# Patient Record
Sex: Male | Born: 1935 | Race: White | Hispanic: No | Marital: Single | Smoking: Never smoker
Health system: Southern US, Community
[De-identification: ages and names within clinical notes are randomized; demographics above are authoritative.]

## PROBLEM LIST (undated history)

## (undated) DIAGNOSIS — J189 Pneumonia, unspecified organism: Secondary | ICD-10-CM

## (undated) DIAGNOSIS — Y92009 Unspecified place in unspecified non-institutional (private) residence as the place of occurrence of the external cause: Secondary | ICD-10-CM

## (undated) DIAGNOSIS — H9190 Unspecified hearing loss, unspecified ear: Secondary | ICD-10-CM

## (undated) DIAGNOSIS — Z515 Encounter for palliative care: Secondary | ICD-10-CM

## (undated) DIAGNOSIS — R972 Elevated prostate specific antigen [PSA]: Secondary | ICD-10-CM

## (undated) DIAGNOSIS — N189 Chronic kidney disease, unspecified: Secondary | ICD-10-CM

## (undated) DIAGNOSIS — I10 Essential (primary) hypertension: Secondary | ICD-10-CM

## (undated) DIAGNOSIS — K449 Diaphragmatic hernia without obstruction or gangrene: Secondary | ICD-10-CM

## (undated) DIAGNOSIS — K5790 Diverticulosis of intestine, part unspecified, without perforation or abscess without bleeding: Secondary | ICD-10-CM

## (undated) DIAGNOSIS — I441 Atrioventricular block, second degree: Secondary | ICD-10-CM

## (undated) DIAGNOSIS — C4491 Basal cell carcinoma of skin, unspecified: Secondary | ICD-10-CM

## (undated) DIAGNOSIS — K219 Gastro-esophageal reflux disease without esophagitis: Secondary | ICD-10-CM

## (undated) DIAGNOSIS — W19XXXA Unspecified fall, initial encounter: Secondary | ICD-10-CM

## (undated) DIAGNOSIS — Z9289 Personal history of other medical treatment: Secondary | ICD-10-CM

## (undated) DIAGNOSIS — IMO0002 Reserved for concepts with insufficient information to code with codable children: Secondary | ICD-10-CM

## (undated) DIAGNOSIS — F329 Major depressive disorder, single episode, unspecified: Secondary | ICD-10-CM

## (undated) DIAGNOSIS — E785 Hyperlipidemia, unspecified: Secondary | ICD-10-CM

## (undated) DIAGNOSIS — H903 Sensorineural hearing loss, bilateral: Secondary | ICD-10-CM

## (undated) DIAGNOSIS — F32A Depression, unspecified: Secondary | ICD-10-CM

## (undated) DIAGNOSIS — I639 Cerebral infarction, unspecified: Secondary | ICD-10-CM

## (undated) HISTORY — DX: Essential (primary) hypertension: I10

## (undated) HISTORY — DX: Elevated prostate specific antigen (PSA): R97.20

## (undated) HISTORY — DX: Hyperlipidemia, unspecified: E78.5

## (undated) HISTORY — DX: Atrioventricular block, second degree: I44.1

## (undated) HISTORY — DX: Diverticulosis of intestine, part unspecified, without perforation or abscess without bleeding: K57.90

## (undated) HISTORY — DX: Encounter for palliative care: Z51.5

## (undated) HISTORY — DX: Reserved for concepts with insufficient information to code with codable children: IMO0002

## (undated) HISTORY — DX: Diaphragmatic hernia without obstruction or gangrene: K44.9

## (undated) HISTORY — DX: Cerebral infarction, unspecified: I63.9

## (undated) HISTORY — DX: Basal cell carcinoma of skin, unspecified: C44.91

## (undated) HISTORY — DX: Pneumonia, unspecified organism: J18.9

## (undated) HISTORY — DX: Personal history of other medical treatment: Z92.89

## (undated) HISTORY — PX: CATARACT EXTRACTION W/ INTRAOCULAR LENS  IMPLANT, BILATERAL: SHX1307

## (undated) HISTORY — DX: Gastro-esophageal reflux disease without esophagitis: K21.9

## (undated) HISTORY — DX: Sensorineural hearing loss, bilateral: H90.3

---

## 1986-08-09 DIAGNOSIS — E785 Hyperlipidemia, unspecified: Secondary | ICD-10-CM

## 1986-08-09 HISTORY — DX: Hyperlipidemia, unspecified: E78.5

## 1995-05-10 DIAGNOSIS — Z9289 Personal history of other medical treatment: Secondary | ICD-10-CM

## 1995-05-10 HISTORY — DX: Personal history of other medical treatment: Z92.89

## 1995-05-10 HISTORY — PX: CHOLECYSTECTOMY: SHX55

## 1996-08-09 DIAGNOSIS — K219 Gastro-esophageal reflux disease without esophagitis: Secondary | ICD-10-CM

## 1996-08-09 HISTORY — DX: Gastro-esophageal reflux disease without esophagitis: K21.9

## 1997-08-09 ENCOUNTER — Encounter: Payer: Self-pay | Admitting: Family Medicine

## 1997-08-09 LAB — CONVERTED CEMR LAB: PSA: 3.9 ng/mL

## 1999-01-08 ENCOUNTER — Encounter: Payer: Self-pay | Admitting: Family Medicine

## 1999-01-08 LAB — CONVERTED CEMR LAB: PSA: 4.2 ng/mL

## 1999-12-08 ENCOUNTER — Encounter: Payer: Self-pay | Admitting: Family Medicine

## 1999-12-08 LAB — CONVERTED CEMR LAB: PSA: 3.6 ng/mL

## 2000-12-19 ENCOUNTER — Encounter: Payer: Self-pay | Admitting: Family Medicine

## 2000-12-19 LAB — CONVERTED CEMR LAB: PSA: 3.3 ng/mL

## 2002-01-08 ENCOUNTER — Encounter: Payer: Self-pay | Admitting: Family Medicine

## 2002-01-08 LAB — CONVERTED CEMR LAB: PSA: 4 ng/mL

## 2003-01-10 ENCOUNTER — Encounter: Payer: Self-pay | Admitting: Family Medicine

## 2003-01-10 LAB — CONVERTED CEMR LAB: PSA: 3.7 ng/mL

## 2004-01-22 ENCOUNTER — Encounter: Payer: Self-pay | Admitting: Family Medicine

## 2004-01-22 LAB — CONVERTED CEMR LAB: PSA: 3.5 ng/mL

## 2004-07-23 ENCOUNTER — Ambulatory Visit: Payer: Self-pay | Admitting: Family Medicine

## 2004-07-30 ENCOUNTER — Ambulatory Visit: Payer: Self-pay | Admitting: Gastroenterology

## 2004-08-18 ENCOUNTER — Ambulatory Visit: Payer: Self-pay | Admitting: Gastroenterology

## 2004-08-18 HISTORY — PX: ESOPHAGOGASTRODUODENOSCOPY: SHX1529

## 2004-08-21 ENCOUNTER — Ambulatory Visit (HOSPITAL_COMMUNITY): Admission: RE | Admit: 2004-08-21 | Discharge: 2004-08-21 | Payer: Self-pay | Admitting: Gastroenterology

## 2004-09-03 ENCOUNTER — Ambulatory Visit: Payer: Self-pay | Admitting: Gastroenterology

## 2004-10-11 ENCOUNTER — Emergency Department (HOSPITAL_COMMUNITY): Admission: EM | Admit: 2004-10-11 | Discharge: 2004-10-11 | Payer: Self-pay | Admitting: Emergency Medicine

## 2004-10-16 ENCOUNTER — Ambulatory Visit: Payer: Self-pay | Admitting: Family Medicine

## 2004-10-18 ENCOUNTER — Emergency Department (HOSPITAL_COMMUNITY): Admission: EM | Admit: 2004-10-18 | Discharge: 2004-10-18 | Payer: Self-pay | Admitting: Emergency Medicine

## 2004-10-27 ENCOUNTER — Ambulatory Visit: Payer: Self-pay | Admitting: Family Medicine

## 2004-11-10 ENCOUNTER — Ambulatory Visit: Payer: Self-pay | Admitting: Family Medicine

## 2004-12-10 ENCOUNTER — Ambulatory Visit: Payer: Self-pay | Admitting: Family Medicine

## 2004-12-14 ENCOUNTER — Ambulatory Visit: Payer: Self-pay | Admitting: Family Medicine

## 2004-12-23 LAB — HM DEXA SCAN

## 2005-01-19 ENCOUNTER — Ambulatory Visit: Payer: Self-pay | Admitting: Family Medicine

## 2005-01-19 LAB — CONVERTED CEMR LAB: PSA: 4 ng/mL

## 2005-01-21 ENCOUNTER — Ambulatory Visit: Payer: Self-pay | Admitting: Family Medicine

## 2005-01-28 ENCOUNTER — Ambulatory Visit: Payer: Self-pay | Admitting: Family Medicine

## 2005-03-15 ENCOUNTER — Ambulatory Visit: Payer: Self-pay | Admitting: Family Medicine

## 2005-06-15 ENCOUNTER — Ambulatory Visit: Payer: Self-pay | Admitting: Family Medicine

## 2005-10-13 ENCOUNTER — Ambulatory Visit: Payer: Self-pay | Admitting: Family Medicine

## 2005-11-16 ENCOUNTER — Ambulatory Visit: Payer: Self-pay | Admitting: Family Medicine

## 2006-01-25 ENCOUNTER — Ambulatory Visit: Payer: Self-pay | Admitting: Family Medicine

## 2006-01-25 LAB — CONVERTED CEMR LAB: PSA: 4.58 ng/mL

## 2006-01-27 ENCOUNTER — Ambulatory Visit: Payer: Self-pay | Admitting: Family Medicine

## 2006-02-04 ENCOUNTER — Ambulatory Visit: Payer: Self-pay | Admitting: Family Medicine

## 2006-05-17 ENCOUNTER — Ambulatory Visit: Payer: Self-pay | Admitting: Family Medicine

## 2006-07-29 ENCOUNTER — Ambulatory Visit: Payer: Self-pay | Admitting: Family Medicine

## 2006-07-29 LAB — CONVERTED CEMR LAB: PSA: 7.3 ng/mL

## 2006-08-03 ENCOUNTER — Ambulatory Visit: Payer: Self-pay | Admitting: Cardiology

## 2006-09-08 ENCOUNTER — Ambulatory Visit: Payer: Self-pay | Admitting: Family Medicine

## 2006-09-08 LAB — CONVERTED CEMR LAB
PSA, Free Pct: 33 (ref >25)
PSA, Free: 1.9 ng/mL
PSA: 5.71 ng/mL
PSA: 5.71 ng/mL — ABNORMAL HIGH (ref 0.10–4.00)

## 2006-09-12 ENCOUNTER — Ambulatory Visit: Payer: Self-pay

## 2006-09-12 ENCOUNTER — Ambulatory Visit: Payer: Self-pay | Admitting: Cardiology

## 2006-09-12 ENCOUNTER — Ambulatory Visit: Payer: Self-pay | Admitting: Family Medicine

## 2006-09-12 HISTORY — PX: OTHER SURGICAL HISTORY: SHX169

## 2007-01-03 ENCOUNTER — Emergency Department (HOSPITAL_COMMUNITY): Admission: EM | Admit: 2007-01-03 | Discharge: 2007-01-03 | Payer: Self-pay | Admitting: Emergency Medicine

## 2007-01-03 ENCOUNTER — Telehealth (INDEPENDENT_AMBULATORY_CARE_PROVIDER_SITE_OTHER): Payer: Self-pay | Admitting: *Deleted

## 2007-01-05 ENCOUNTER — Encounter: Payer: Self-pay | Admitting: Family Medicine

## 2007-01-05 DIAGNOSIS — R972 Elevated prostate specific antigen [PSA]: Secondary | ICD-10-CM

## 2007-01-05 DIAGNOSIS — L719 Rosacea, unspecified: Secondary | ICD-10-CM

## 2007-01-05 DIAGNOSIS — K869 Disease of pancreas, unspecified: Secondary | ICD-10-CM | POA: Insufficient documentation

## 2007-01-06 ENCOUNTER — Ambulatory Visit: Payer: Self-pay | Admitting: Family Medicine

## 2007-01-06 DIAGNOSIS — K219 Gastro-esophageal reflux disease without esophagitis: Secondary | ICD-10-CM | POA: Insufficient documentation

## 2007-01-06 DIAGNOSIS — E785 Hyperlipidemia, unspecified: Secondary | ICD-10-CM | POA: Insufficient documentation

## 2007-01-06 DIAGNOSIS — M810 Age-related osteoporosis without current pathological fracture: Secondary | ICD-10-CM | POA: Insufficient documentation

## 2007-01-06 DIAGNOSIS — I10 Essential (primary) hypertension: Secondary | ICD-10-CM

## 2007-02-13 ENCOUNTER — Ambulatory Visit: Payer: Self-pay | Admitting: Family Medicine

## 2007-02-13 LAB — CONVERTED CEMR LAB
ALT: 13 units/L (ref 0–53)
AST: 17 units/L (ref 0–37)
Albumin: 3.6 g/dL (ref 3.5–5.2)
Alkaline Phosphatase: 69 units/L (ref 39–117)
BUN: 15 mg/dL (ref 6–23)
Bilirubin, Direct: 0.1 mg/dL (ref 0.0–0.3)
CO2: 31 meq/L (ref 19–32)
Calcium: 9.1 mg/dL (ref 8.4–10.5)
Chloride: 109 meq/L (ref 96–112)
Cholesterol: 177 mg/dL (ref 0–200)
Creatinine, Ser: 1.2 mg/dL (ref 0.4–1.5)
GFR calc Af Amer: 75 mL/min
GFR calc non Af Amer: 62 mL/min
Glucose, Bld: 106 mg/dL — ABNORMAL HIGH (ref 70–99)
HDL: 36.7 mg/dL — ABNORMAL LOW (ref >39.0)
LDL Cholesterol: 113 mg/dL — ABNORMAL HIGH (ref 0–99)
PSA: 5.88 ng/mL — ABNORMAL HIGH (ref 0.10–4.00)
Potassium: 4.4 meq/L (ref 3.5–5.1)
Sodium: 142 meq/L (ref 135–145)
TSH: 0.83 microintl units/mL (ref 0.35–5.50)
Total Bilirubin: 0.8 mg/dL (ref 0.3–1.2)
Total CHOL/HDL Ratio: 4.8
Total Protein: 7.7 g/dL (ref 6.0–8.3)
Triglycerides: 135 mg/dL (ref 0–149)
VLDL: 27 mg/dL (ref 0–40)

## 2007-02-15 ENCOUNTER — Ambulatory Visit: Payer: Self-pay | Admitting: Family Medicine

## 2007-03-03 ENCOUNTER — Ambulatory Visit: Payer: Self-pay | Admitting: Family Medicine

## 2007-03-07 ENCOUNTER — Ambulatory Visit: Payer: Self-pay | Admitting: Family Medicine

## 2007-03-31 ENCOUNTER — Ambulatory Visit: Payer: Self-pay | Admitting: Family Medicine

## 2007-04-18 ENCOUNTER — Ambulatory Visit: Payer: Self-pay | Admitting: Gastroenterology

## 2007-04-18 LAB — CONVERTED CEMR LAB
Basophils Absolute: 0 10*3/uL (ref 0.0–0.1)
Basophils Relative: 0.5 % (ref 0.0–1.0)
Eosinophils Absolute: 0.2 10*3/uL (ref 0.0–0.6)
Eosinophils Relative: 2.6 % (ref 0.0–5.0)
HCT: 37.6 % — ABNORMAL LOW (ref 39.0–52.0)
Hemoglobin: 13.4 g/dL (ref 13.0–17.0)
Lymphocytes Relative: 20.5 % (ref 12.0–46.0)
MCHC: 35.5 g/dL (ref 30.0–36.0)
MCV: 94.5 fL (ref 78.0–100.0)
Monocytes Absolute: 0.5 10*3/uL (ref 0.2–0.7)
Monocytes Relative: 5.8 % (ref 3.0–11.0)
Neutro Abs: 6.8 10*3/uL (ref 1.4–7.7)
Neutrophils Relative %: 70.6 % (ref 43.0–77.0)
Platelets: 205 10*3/uL (ref 150–400)
RBC: 3.98 M/uL — ABNORMAL LOW (ref 4.22–5.81)
RDW: 12.3 % (ref 11.5–14.6)
WBC: 9.4 10*3/uL (ref 4.5–10.5)

## 2007-05-01 ENCOUNTER — Ambulatory Visit: Payer: Self-pay | Admitting: Gastroenterology

## 2007-05-01 ENCOUNTER — Encounter: Payer: Self-pay | Admitting: Family Medicine

## 2007-05-01 DIAGNOSIS — K5730 Diverticulosis of large intestine without perforation or abscess without bleeding: Secondary | ICD-10-CM | POA: Insufficient documentation

## 2007-05-01 DIAGNOSIS — K648 Other hemorrhoids: Secondary | ICD-10-CM | POA: Insufficient documentation

## 2007-05-01 HISTORY — PX: COLONOSCOPY: SHX174

## 2007-05-01 LAB — HM COLONOSCOPY

## 2007-05-19 ENCOUNTER — Ambulatory Visit: Payer: Self-pay | Admitting: Family Medicine

## 2007-08-21 ENCOUNTER — Ambulatory Visit: Payer: Self-pay | Admitting: Family Medicine

## 2007-08-28 ENCOUNTER — Ambulatory Visit: Payer: Self-pay | Admitting: Family Medicine

## 2007-09-04 ENCOUNTER — Ambulatory Visit: Payer: Self-pay | Admitting: Family Medicine

## 2007-09-11 ENCOUNTER — Ambulatory Visit: Payer: Self-pay | Admitting: Family Medicine

## 2007-09-18 ENCOUNTER — Ambulatory Visit: Payer: Self-pay | Admitting: Family Medicine

## 2007-09-25 ENCOUNTER — Ambulatory Visit: Payer: Self-pay | Admitting: Family Medicine

## 2007-10-27 DIAGNOSIS — K449 Diaphragmatic hernia without obstruction or gangrene: Secondary | ICD-10-CM | POA: Insufficient documentation

## 2007-11-01 ENCOUNTER — Ambulatory Visit: Payer: Self-pay | Admitting: Internal Medicine

## 2007-11-01 LAB — CONVERTED CEMR LAB: Rapid Strep: NEGATIVE

## 2008-02-12 ENCOUNTER — Ambulatory Visit: Payer: Self-pay | Admitting: Family Medicine

## 2008-02-12 LAB — CONVERTED CEMR LAB
ALT: 11 units/L (ref 0–53)
AST: 13 units/L (ref 0–37)
Albumin: 3.4 g/dL — ABNORMAL LOW (ref 3.5–5.2)
Alkaline Phosphatase: 48 units/L (ref 39–117)
BUN: 13 mg/dL (ref 6–23)
Basophils Absolute: 0 10*3/uL (ref 0.0–0.1)
Basophils Relative: 0.6 % (ref 0.0–1.0)
Bilirubin, Direct: 0.1 mg/dL (ref 0.0–0.3)
CO2: 33 meq/L — ABNORMAL HIGH (ref 19–32)
Calcium: 9.4 mg/dL (ref 8.4–10.5)
Chloride: 103 meq/L (ref 96–112)
Cholesterol: 175 mg/dL (ref 0–200)
Creatinine, Ser: 1.2 mg/dL (ref 0.4–1.5)
Eosinophils Absolute: 0.2 10*3/uL (ref 0.0–0.7)
Eosinophils Relative: 3.6 % (ref 0.0–5.0)
GFR calc Af Amer: 75 mL/min
GFR calc non Af Amer: 62 mL/min
Glucose, Bld: 91 mg/dL (ref 70–99)
HCT: 41.3 % (ref 39.0–52.0)
HDL: 31.7 mg/dL — ABNORMAL LOW (ref >39.0)
Hemoglobin: 14.8 g/dL (ref 13.0–17.0)
LDL Cholesterol: 122 mg/dL — ABNORMAL HIGH (ref 0–99)
Lymphocytes Relative: 29.5 % (ref 12.0–46.0)
MCHC: 35.8 g/dL (ref 30.0–36.0)
MCV: 94.4 fL (ref 78.0–100.0)
Monocytes Absolute: 0.6 10*3/uL (ref 0.1–1.0)
Monocytes Relative: 9.5 % (ref 3.0–12.0)
Neutro Abs: 3.8 10*3/uL (ref 1.4–7.7)
Neutrophils Relative %: 56.8 % (ref 43.0–77.0)
PSA: 6.65 ng/mL — ABNORMAL HIGH (ref 0.10–4.00)
Platelets: 168 10*3/uL (ref 150–400)
Potassium: 3.8 meq/L (ref 3.5–5.1)
RBC: 4.38 M/uL (ref 4.22–5.81)
RDW: 13 % (ref 11.5–14.6)
Sodium: 143 meq/L (ref 135–145)
TSH: 1.15 microintl units/mL (ref 0.35–5.50)
Total Bilirubin: 0.9 mg/dL (ref 0.3–1.2)
Total CHOL/HDL Ratio: 5.5
Total Protein: 6.9 g/dL (ref 6.0–8.3)
Triglycerides: 105 mg/dL (ref 0–149)
VLDL: 21 mg/dL (ref 0–40)
WBC: 6.5 10*3/uL (ref 4.5–10.5)

## 2008-02-15 LAB — CONVERTED CEMR LAB: Vit D, 1,25-Dihydroxy: 26 — ABNORMAL LOW (ref 30–89)

## 2008-02-16 ENCOUNTER — Ambulatory Visit: Payer: Self-pay | Admitting: Family Medicine

## 2008-05-13 ENCOUNTER — Ambulatory Visit: Payer: Self-pay | Admitting: Family Medicine

## 2008-08-12 ENCOUNTER — Ambulatory Visit: Payer: Self-pay | Admitting: Family Medicine

## 2008-08-13 LAB — CONVERTED CEMR LAB
ALT: 12 units/L (ref 0–53)
AST: 17 units/L (ref 0–37)
Albumin: 3.7 g/dL (ref 3.5–5.2)
Alkaline Phosphatase: 35 units/L — ABNORMAL LOW (ref 39–117)
BUN: 22 mg/dL (ref 6–23)
Basophils Absolute: 0.1 10*3/uL (ref 0.0–0.1)
Basophils Relative: 0.8 % (ref 0.0–3.0)
Bilirubin, Direct: 0.1 mg/dL (ref 0.0–0.3)
CO2: 29 meq/L (ref 19–32)
Calcium: 9.6 mg/dL (ref 8.4–10.5)
Chloride: 103 meq/L (ref 96–112)
Creatinine, Ser: 1.2 mg/dL (ref 0.4–1.5)
Eosinophils Absolute: 0.4 10*3/uL (ref 0.0–0.7)
Eosinophils Relative: 3.9 % (ref 0.0–5.0)
GFR calc Af Amer: 75 mL/min
GFR calc non Af Amer: 62 mL/min
Glucose, Bld: 112 mg/dL — ABNORMAL HIGH (ref 70–99)
HCT: 39.8 % (ref 39.0–52.0)
Hemoglobin: 14.2 g/dL (ref 13.0–17.0)
Lymphocytes Relative: 28.1 % (ref 12.0–46.0)
MCHC: 35.6 g/dL (ref 30.0–36.0)
MCV: 97.1 fL (ref 78.0–100.0)
Monocytes Absolute: 0.5 10*3/uL (ref 0.1–1.0)
Monocytes Relative: 5.5 % (ref 3.0–12.0)
Neutro Abs: 5.8 10*3/uL (ref 1.4–7.7)
Neutrophils Relative %: 61.7 % (ref 43.0–77.0)
Platelets: 190 10*3/uL (ref 150–400)
Potassium: 4.2 meq/L (ref 3.5–5.1)
RBC: 4.09 M/uL — ABNORMAL LOW (ref 4.22–5.81)
RDW: 12.4 % (ref 11.5–14.6)
Sodium: 141 meq/L (ref 135–145)
Total Bilirubin: 0.6 mg/dL (ref 0.3–1.2)
Total Protein: 7.3 g/dL (ref 6.0–8.3)
WBC: 9.4 10*3/uL (ref 4.5–10.5)

## 2008-08-20 ENCOUNTER — Ambulatory Visit: Payer: Self-pay | Admitting: Family Medicine

## 2008-12-04 ENCOUNTER — Encounter: Payer: Self-pay | Admitting: Family Medicine

## 2009-02-13 ENCOUNTER — Ambulatory Visit: Payer: Self-pay | Admitting: Family Medicine

## 2009-02-13 LAB — CONVERTED CEMR LAB
ALT: 11 units/L (ref 0–53)
AST: 16 units/L (ref 0–37)
Albumin: 3.4 g/dL — ABNORMAL LOW (ref 3.5–5.2)
Alkaline Phosphatase: 41 units/L (ref 39–117)
BUN: 12 mg/dL (ref 6–23)
Basophils Absolute: 0 10*3/uL (ref 0.0–0.1)
Basophils Relative: 0.6 % (ref 0.0–3.0)
Bilirubin, Direct: 0.1 mg/dL (ref 0.0–0.3)
CO2: 32 meq/L (ref 19–32)
Calcium: 9.1 mg/dL (ref 8.4–10.5)
Chloride: 106 meq/L (ref 96–112)
Cholesterol: 174 mg/dL (ref 0–200)
Creatinine, Ser: 1.3 mg/dL (ref 0.4–1.5)
Eosinophils Absolute: 0.3 10*3/uL (ref 0.0–0.7)
Eosinophils Relative: 5.3 % — ABNORMAL HIGH (ref 0.0–5.0)
GFR calc non Af Amer: 56.06 mL/min (ref >60)
Glucose, Bld: 86 mg/dL (ref 70–99)
HCT: 42.4 % (ref 39.0–52.0)
HDL: 36.9 mg/dL — ABNORMAL LOW (ref >39.00)
Hemoglobin: 14.6 g/dL (ref 13.0–17.0)
LDL Cholesterol: 117 mg/dL — ABNORMAL HIGH (ref 0–99)
Lymphocytes Relative: 32.7 % (ref 12.0–46.0)
Lymphs Abs: 1.9 10*3/uL (ref 0.7–4.0)
MCHC: 34.4 g/dL (ref 30.0–36.0)
MCV: 95.4 fL (ref 78.0–100.0)
Monocytes Absolute: 0.6 10*3/uL (ref 0.1–1.0)
Monocytes Relative: 9.8 % (ref 3.0–12.0)
Neutro Abs: 3.1 10*3/uL (ref 1.4–7.7)
Neutrophils Relative %: 51.6 % (ref 43.0–77.0)
PSA, Free Pct: 26 (ref >25)
PSA, Free: 1.3 ng/mL
PSA: 4.99 ng/mL — ABNORMAL HIGH (ref 0.10–4.00)
Platelets: 162 10*3/uL (ref 150.0–400.0)
Potassium: 4.4 meq/L (ref 3.5–5.1)
RBC: 4.45 M/uL (ref 4.22–5.81)
RDW: 13.4 % (ref 11.5–14.6)
Sodium: 143 meq/L (ref 135–145)
TSH: 1.13 microintl units/mL (ref 0.35–5.50)
Total Bilirubin: 1 mg/dL (ref 0.3–1.2)
Total CHOL/HDL Ratio: 5
Total Protein: 7.4 g/dL (ref 6.0–8.3)
Triglycerides: 101 mg/dL (ref 0.0–149.0)
VLDL: 20.2 mg/dL (ref 0.0–40.0)
Vit D, 25-Hydroxy: 45 ng/mL (ref 30–89)
WBC: 5.9 10*3/uL (ref 4.5–10.5)

## 2009-02-17 ENCOUNTER — Ambulatory Visit: Payer: Self-pay | Admitting: Family Medicine

## 2009-02-17 DIAGNOSIS — K4090 Unilateral inguinal hernia, without obstruction or gangrene, not specified as recurrent: Secondary | ICD-10-CM | POA: Insufficient documentation

## 2009-03-14 ENCOUNTER — Encounter: Payer: Self-pay | Admitting: Family Medicine

## 2009-05-08 ENCOUNTER — Ambulatory Visit: Payer: Self-pay | Admitting: Family Medicine

## 2009-09-04 ENCOUNTER — Ambulatory Visit: Payer: Self-pay | Admitting: Family Medicine

## 2010-04-14 DIAGNOSIS — E876 Hypokalemia: Secondary | ICD-10-CM

## 2010-04-16 ENCOUNTER — Ambulatory Visit: Payer: Self-pay | Admitting: Cardiology

## 2010-04-16 ENCOUNTER — Inpatient Hospital Stay (HOSPITAL_COMMUNITY)
Admission: EM | Admit: 2010-04-16 | Discharge: 2010-04-18 | Payer: Self-pay | Source: Home / Self Care | Admitting: Emergency Medicine

## 2010-04-17 ENCOUNTER — Ambulatory Visit: Payer: Self-pay | Admitting: Vascular Surgery

## 2010-05-14 ENCOUNTER — Ambulatory Visit: Payer: Self-pay | Admitting: Family Medicine

## 2010-05-14 LAB — CONVERTED CEMR LAB
BUN: 19 mg/dL (ref 6–23)
Basophils Absolute: 0 10*3/uL (ref 0.0–0.1)
Basophils Relative: 0.3 % (ref 0.0–3.0)
CO2: 32 meq/L (ref 19–32)
Calcium: 9.6 mg/dL (ref 8.4–10.5)
Chloride: 99 meq/L (ref 96–112)
Creatinine, Ser: 1.3 mg/dL (ref 0.4–1.5)
Eosinophils Absolute: 0.2 10*3/uL (ref 0.0–0.7)
Eosinophils Relative: 1.8 % (ref 0.0–5.0)
GFR calc non Af Amer: 55.4 mL/min (ref >60)
Glucose, Bld: 94 mg/dL (ref 70–99)
HCT: 45 % (ref 39.0–52.0)
Hemoglobin: 15.6 g/dL (ref 13.0–17.0)
Lymphocytes Relative: 27.6 % (ref 12.0–46.0)
Lymphs Abs: 2.6 10*3/uL (ref 0.7–4.0)
MCHC: 34.6 g/dL (ref 30.0–36.0)
MCV: 97 fL (ref 78.0–100.0)
Monocytes Absolute: 0.8 10*3/uL (ref 0.1–1.0)
Monocytes Relative: 8.8 % (ref 3.0–12.0)
Neutro Abs: 5.7 10*3/uL (ref 1.4–7.7)
Neutrophils Relative %: 61.5 % (ref 43.0–77.0)
Platelets: 187 10*3/uL (ref 150.0–400.0)
Potassium: 4.1 meq/L (ref 3.5–5.1)
RBC: 4.64 M/uL (ref 4.22–5.81)
RDW: 14.2 % (ref 11.5–14.6)
Sodium: 138 meq/L (ref 135–145)
WBC: 9.3 10*3/uL (ref 4.5–10.5)

## 2010-06-25 ENCOUNTER — Ambulatory Visit: Payer: Self-pay | Admitting: Family Medicine

## 2010-06-25 LAB — CONVERTED CEMR LAB
ALT: 10 units/L (ref 0–53)
AST: 14 units/L (ref 0–37)
Albumin: 3.8 g/dL (ref 3.5–5.2)
Alkaline Phosphatase: 46 units/L (ref 39–117)
BUN: 19 mg/dL (ref 6–23)
Basophils Absolute: 0 10*3/uL (ref 0.0–0.1)
Basophils Relative: 0.5 % (ref 0.0–3.0)
Bilirubin, Direct: 0.1 mg/dL (ref 0.0–0.3)
CO2: 30 meq/L (ref 19–32)
Calcium: 9.5 mg/dL (ref 8.4–10.5)
Chloride: 99 meq/L (ref 96–112)
Cholesterol: 181 mg/dL (ref 0–200)
Creatinine, Ser: 1.3 mg/dL (ref 0.4–1.5)
Eosinophils Absolute: 0.2 10*3/uL (ref 0.0–0.7)
Eosinophils Relative: 3.4 % (ref 0.0–5.0)
GFR calc non Af Amer: 57.4 mL/min (ref >60)
Glucose, Bld: 99 mg/dL (ref 70–99)
HCT: 42.6 % (ref 39.0–52.0)
HDL: 37.2 mg/dL — ABNORMAL LOW (ref >39.00)
Hemoglobin: 14.4 g/dL (ref 13.0–17.0)
LDL Cholesterol: 117 mg/dL — ABNORMAL HIGH (ref 0–99)
Lymphocytes Relative: 29.2 % (ref 12.0–46.0)
Lymphs Abs: 2 10*3/uL (ref 0.7–4.0)
MCHC: 33.9 g/dL (ref 30.0–36.0)
MCV: 97.7 fL (ref 78.0–100.0)
Monocytes Absolute: 0.6 10*3/uL (ref 0.1–1.0)
Monocytes Relative: 9.7 % (ref 3.0–12.0)
Neutro Abs: 3.8 10*3/uL (ref 1.4–7.7)
Neutrophils Relative %: 57.2 % (ref 43.0–77.0)
Platelets: 208 10*3/uL (ref 150.0–400.0)
Potassium: 4 meq/L (ref 3.5–5.1)
RBC: 4.36 M/uL (ref 4.22–5.81)
RDW: 13.2 % (ref 11.5–14.6)
Sodium: 138 meq/L (ref 135–145)
TSH: 0.63 microintl units/mL (ref 0.35–5.50)
Total Bilirubin: 0.6 mg/dL (ref 0.3–1.2)
Total CHOL/HDL Ratio: 5
Total Protein: 7.1 g/dL (ref 6.0–8.3)
Triglycerides: 135 mg/dL (ref 0.0–149.0)
VLDL: 27 mg/dL (ref 0.0–40.0)
WBC: 6.7 10*3/uL (ref 4.5–10.5)

## 2010-06-29 LAB — CONVERTED CEMR LAB: Vit D, 25-Hydroxy: 44 ng/mL (ref 30–89)

## 2010-07-08 ENCOUNTER — Ambulatory Visit: Payer: Self-pay | Admitting: Family Medicine

## 2010-07-16 ENCOUNTER — Ambulatory Visit: Payer: Self-pay | Admitting: Family Medicine

## 2010-07-29 ENCOUNTER — Encounter: Payer: Self-pay | Admitting: Family Medicine

## 2010-08-12 LAB — BASIC METABOLIC PANEL
BUN: 18 mg/dL (ref 6–23)
CO2: 26 mEq/L (ref 19–32)
Calcium: 9.1 mg/dL (ref 8.4–10.5)
Chloride: 104 mEq/L (ref 96–112)
Creatinine, Ser: 1.38 mg/dL (ref 0.4–1.5)
GFR calc Af Amer: 59 mL/min — ABNORMAL LOW (ref >60)
GFR calc non Af Amer: 49 mL/min — ABNORMAL LOW (ref >60)
Glucose, Bld: 90 mg/dL (ref 70–99)
Potassium: 3.2 mEq/L — ABNORMAL LOW (ref 3.5–5.1)
Sodium: 140 mEq/L (ref 135–145)

## 2010-08-14 ENCOUNTER — Ambulatory Visit (HOSPITAL_COMMUNITY)
Admission: RE | Admit: 2010-08-14 | Discharge: 2010-08-15 | Payer: Self-pay | Source: Home / Self Care | Attending: Surgery | Admitting: Surgery

## 2010-08-14 HISTORY — PX: INGUINAL HERNIA REPAIR: SUR1180

## 2010-08-17 ENCOUNTER — Encounter: Payer: Self-pay | Admitting: Family Medicine

## 2010-08-17 ENCOUNTER — Telehealth: Payer: Self-pay | Admitting: Family Medicine

## 2010-08-24 LAB — CBC
HCT: 43.4 % (ref 39.0–52.0)
Hemoglobin: 14.6 g/dL (ref 13.0–17.0)
MCH: 32.4 pg (ref 26.0–34.0)
MCHC: 33.6 g/dL (ref 30.0–36.0)
MCV: 96.4 fL (ref 78.0–100.0)
Platelets: 157 10*3/uL (ref 150–400)
RBC: 4.5 MIL/uL (ref 4.22–5.81)
RDW: 12.8 % (ref 11.5–15.5)
WBC: 6.4 10*3/uL (ref 4.0–10.5)

## 2010-08-25 ENCOUNTER — Encounter: Payer: Self-pay | Admitting: Family Medicine

## 2010-08-27 ENCOUNTER — Encounter: Payer: Self-pay | Admitting: Family Medicine

## 2010-09-01 ENCOUNTER — Telehealth: Payer: Self-pay | Admitting: Family Medicine

## 2010-09-03 ENCOUNTER — Encounter: Payer: Self-pay | Admitting: Family Medicine

## 2010-09-03 LAB — SURGICAL PCR SCREEN
MRSA, PCR: NEGATIVE
Staphylococcus aureus: NEGATIVE

## 2010-09-05 ENCOUNTER — Encounter: Payer: Self-pay | Admitting: Family Medicine

## 2010-09-08 NOTE — Assessment & Plan Note (Signed)
Summary: 1 WK F/U DLO   Vital Signs:  Patient profile:   77 year old male Weight:      170.50 pounds Temp:     98.0 degrees F oral Pulse rate:   60 / minute Pulse rhythm:   regular BP sitting:   126 / 70  (left arm) Cuff size:   regular  Vitals Entered By: Sydell Axon LPN (July 16, 2010 8:54 AM) CC: One week follow-up on hands and feet, has been using the cream and it has helped   History of Present Illness: Pt here for one week followup of dermatitis of the hands and feet, felt to be dry skin. He was told to use Eucerin routinely and has seen significant improvement. He has had sloughing of the skin on the hands>>feet and both have intact skin otherwise. He has no other complaints.  Problems Prior to Update: 1)  Dyshidrosis  (ICD-705.81) 2)  Hypokalemia  (ICD-276.8) 3)  Inguinal Hernia, Left  (ICD-550.90) 4)  Hx of Hiatal Hernia  (ICD-553.3) 5)  Hemorrhoids, Internal  (ICD-455.0) 6)  Diverticulosis, Colon  (ICD-562.10) 7)  Screening For Malignannt Neoplasm, Site Nec  (ICD-V76.49) 8)  Compression Fracture, L 3 Vertebrae  (ICD-805.4) 9)  Gallstone Pancreatitis  (ICD-577.9) 10)  Prostate Specific Antigen, Elevated  (ICD-790.93) 11)  Rosacea  (ICD-695.3) 12)  Basal Cell Carcinoma, Face L Ear  (ICD-173.3) 13)  Osteoporosis  (ICD-733.00) 14)  Hypertension  (ICD-401.9) 15)  Hyperlipidemia  (ICD-272.4) 16)  Gerd  (ICD-530.81)  Medications Prior to Update: 1)  Nexium 40 Mg Cpdr (Esomeprazole Magnesium) .Marland Kitchen.. 1 Tablet Daily 2)  Lisinopril-Hydrochlorothiazide 20-25 Mg Tabs (Lisinopril-Hydrochlorothiazide) .Marland Kitchen.. 1 Tablet Daily 3)  Flaxseed Oil 1000 Mg  Caps (Flaxseed (Linseed)) .Marland Kitchen.. 1 Daily 4)  Bayer Low Strength 81 Mg Tbec (Aspirin) .Marland Kitchen.. 1 Daily By Mouth 5)  Vitamin D 1000 Unit Caps (Cholecalciferol) .Marland Kitchen.. 1 Daily By Mouth 6)  Potassium Chloride 20 Meq Pack (Potassium Chloride) .... Take One By Mouth Daily 7)  Loratadine 10 Mg Tabs (Loratadine) .... Take One By Mouth  Daily  Allergies: No Known Drug Allergies  Physical Exam  General:  Well-developed,well-nourished,in no acute distress; alert,appropriate and cooperative throughout examination Skin:  Hands with significant sloughing of skin with intact skin below. Feet minimally sloughing. Skin of both smooth and intact otherwise, slightly erythematous.   Impression & Recommendations:  Problem # 1:  DYSHIDROSIS (ICD-705.81) Assessment Improved Cont Eucerin cream as discussed. May decrease frequency as skin normalizes.  Use rubber gloves for housework and fluid exposure. Avoid exposure to Chlorox and frequent fluid exposure.  Problem # 2:  HYPERTENSION (ICD-401.9) Assessment: Improved Stable. His updated medication list for this problem includes:    Lisinopril-hydrochlorothiazide 20-25 Mg Tabs (Lisinopril-hydrochlorothiazide) .Marland Kitchen... 1 tablet daily  BP today: 126/70 Prior BP: 134/88 (07/08/2010)  Labs Reviewed: K+: 4.0 (06/25/2010) Creat: : 1.3 (06/25/2010)   Chol: 181 (06/25/2010)   HDL: 37.20 (06/25/2010)   LDL: 117 (06/25/2010)   TG: 135.0 (06/25/2010)  Complete Medication List: 1)  Nexium 40 Mg Cpdr (Esomeprazole magnesium) .Marland Kitchen.. 1 tablet daily 2)  Lisinopril-hydrochlorothiazide 20-25 Mg Tabs (Lisinopril-hydrochlorothiazide) .Marland Kitchen.. 1 tablet daily 3)  Flaxseed Oil 1000 Mg Caps (Flaxseed (linseed)) .Marland Kitchen.. 1 daily 4)  Bayer Low Strength 81 Mg Tbec (Aspirin) .Marland Kitchen.. 1 daily by mouth 5)  Vitamin D 1000 Unit Caps (Cholecalciferol) .Marland Kitchen.. 1 daily by mouth 6)  Potassium Chloride 20 Meq Pack (Potassium chloride) .... Take one by mouth daily 7)  Loratadine 10 Mg Tabs (Loratadine) .Marland KitchenMarland KitchenMarland Kitchen  Take one by mouth daily 8)  Eucerin Calming Daily Moist Crea (Emollient) .... Apply four times a day to affected area   Orders Added: 1)  Est. Patient Level III [59563]    Current Allergies (reviewed today): No known allergies

## 2010-09-08 NOTE — Assessment & Plan Note (Signed)
Summary: HANDS AND FEET/CLE   Vital Signs:  Patient profile:   77 year old male Height:      68 inches Weight:      169.25 pounds BMI:     25.83 Temp:     96.9 degrees F oral Pulse rate:   68 / minute Pulse rhythm:   regular BP sitting:   134 / 88  (left arm) Cuff size:   regular  Vitals Entered By: Linde Gillis CMA Duncan Dull) (July 08, 2010 10:22 AM) CC: hands and feet swollen and red   History of Present Illness: Pt here with his son for erythema , itching and mild swelling of his hands and feet which started about a week ago. He denies new powders or soaps but strted using blkeach about 6 mos ago. He has been raking leaves but no other unusual exposure. He feels well otherwise. Burgess Estelle was the three year anniversary of his wife's death. 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  Problems Prior to Update: 1)  Hypokalemia  (ICD-276.8) 2)  Inguinal Hernia, Left  (ICD-550.90) 3)  Hx of Hiatal Hernia  (ICD-553.3) 4)  Hemorrhoids, Internal  (ICD-455.0) 5)  Diverticulosis, Colon  (ICD-562.10) 6)  Screening For Malignannt Neoplasm, Site Nec  (ICD-V76.49) 7)  Compression Fracture, L 3 Vertebrae  (ICD-805.4) 8)  Gallstone Pancreatitis  (ICD-577.9) 9)  Prostate Specific Antigen, Elevated  (ICD-790.93) 10)  Rosacea  (ICD-695.3) 11)  Basal Cell Carcinoma, Face L Ear  (ICD-173.3) 12)  Osteoporosis  (ICD-733.00) 13)  Hypertension  (ICD-401.9) 14)  Hyperlipidemia  (ICD-272.4) 15)  Gerd  (ICD-530.81)  Medications Prior to Update: 1)  Nexium 40 Mg Cpdr (Esomeprazole Magnesium) .Marland Kitchen.. 1 Tablet Daily 2)  Lisinopril-Hydrochlorothiazide 20-25 Mg Tabs (Lisinopril-Hydrochlorothiazide) .Marland Kitchen.. 1 Tablet Daily 3)  Flaxseed Oil 1000 Mg  Caps (Flaxseed (Linseed)) .Marland Kitchen.. 1 Daily 4)  Bayer Low Strength 81 Mg Tbec (Aspirin) .Marland Kitchen.. 1 Daily By Mouth 5)  Vitamin D 1000 Unit Caps (Cholecalciferol) .Marland Kitchen.. 1 Daily By Mouth 6)  Potassium Chloride 20 Meq Pack (Potassium Chloride) .... Take One By Mouth Daily 7)  Loratadine  10 Mg Tabs (Loratadine) .... Take One By Mouth Daily  Allergies (verified): No Known Drug Allergies  Physical Exam  General:  Well-developed,well-nourished,in no acute distress; alert,appropriate and cooperative throughout examination Head:  Normocephalic and atraumatic without obvious abnormalities. No apparent alopecia. Typical male pattern  balding. Eyes:  Conjunctiva clear bilaterally.  Ears:  External ear exam shows no significant lesions or deformities.  Otoscopic examination reveals clear canals, tympanic membranes are intact bilaterally without bulging, retraction, inflammation or discharge. Hearing is grossly normal bilaterally. Nose:  External nasal examination shows no deformity or inflammation. Nasal mucosa are pink and moist without lesions or exudates. Mouth:  Oral mucosa and oropharynx without lesions or exudates.  Teeth in good repair. Skin:  Hands erythem on the palmar surface bilat, dorsal surface nml. Feet red around and between the toes of bothe feet  going poteriorally laterally bilat, minimal involvement of the frank plantar or dorsal surfaces. Minimal swelling if any globally.   Impression & Recommendations:  Problem # 1:  DYSHIDROSIS (ICD-705.81) Assessment New See instructions. Call or RTC sooner as needed.  Complete Medication List: 1)  Nexium 40 Mg Cpdr (Esomeprazole magnesium) .Marland Kitchen.. 1 tablet daily 2)  Lisinopril-hydrochlorothiazide 20-25 Mg Tabs (Lisinopril-hydrochlorothiazide) .Marland Kitchen.. 1 tablet daily 3)  Flaxseed Oil 1000 Mg Caps (Flaxseed (linseed)) .Marland Kitchen.. 1 daily 4)  Bayer Low Strength 81 Mg Tbec (Aspirin) .Marland Kitchen.. 1 daily by mouth 5)  Vitamin D 1000 Unit Caps (Cholecalciferol) .Marland Kitchen.. 1 daily by mouth 6)  Potassium Chloride 20 Meq Pack (Potassium chloride) .... Take one by mouth daily 7)  Loratadine 10 Mg Tabs (Loratadine) .... Take one by mouth daily  Patient Instructions: 1)  RTC one week.  2)  Try using Eucerin Cream, apply 4 times a day to the hands and  feet. 3)  Do not soak hands or feet, no more frequent bathing than once a day.  4)  Discussed 3 year anniv of his wife's death.   Orders Added: 1)  Est. Patient Level III [04540]    Current Allergies (reviewed today): No known allergies

## 2010-09-08 NOTE — Assessment & Plan Note (Signed)
Summary: follow up/alc   Vital Signs:  Patient profile:   77 year old male Weight:      176 pounds BMI:     26.86 Temp:     99.0 degrees F oral Pulse rate:   76 / minute Pulse rhythm:   regular BP sitting:   122 / 70  (left arm) Cuff size:   regular  Vitals Entered By: Sydell Axon LPN (May 14, 2010 11:23 AM) CC: follow-up visit   History of Present Illness: Pt here with daughter in followup of hospitalization a few weeks ago for dehydration and weakness. He had hypokalemia to 2.9 and responded well to hydration and repletion. He now feels well and his daughter has a hard time staying up with him. She actually wonders if he should slow down!! He was put on oral potassium from the hospital. He also would like his hernia repaired.  Problems Prior to Update: 1)  Hypokalemia  (ICD-276.8) 2)  Inguinal Hernia, Left  (ICD-550.90) 3)  Hx of Hiatal Hernia  (ICD-553.3) 4)  Hemorrhoids, Internal  (ICD-455.0) 5)  Diverticulosis, Colon  (ICD-562.10) 6)  Screening For Malignannt Neoplasm, Site Nec  (ICD-V76.49) 7)  Compression Fracture, L 3 Vertebrae  (ICD-805.4) 8)  Gallstone Pancreatitis  (ICD-577.9) 9)  Prostate Specific Antigen, Elevated  (ICD-790.93) 10)  Rosacea  (ICD-695.3) 11)  Basal Cell Carcinoma, Face L Ear  (ICD-173.3) 12)  Osteoporosis  (ICD-733.00) 13)  Hypertension  (ICD-401.9) 14)  Hyperlipidemia  (ICD-272.4) 15)  Gerd  (ICD-530.81)  Medications Prior to Update: 1)  Boniva 150 Mg Tabs (Ibandronate Sodium) .... Take One By Mouth Monthly 2)  Nexium 40 Mg Cpdr (Esomeprazole Magnesium) .Marland Kitchen.. 1 Tablet Daily 3)  Klor-Con M10 10 Meq Tbcr (Potassium Chloride Crys Cr) .Marland Kitchen.. 1 Tablet Daily 4)  Lisinopril-Hydrochlorothiazide 20-25 Mg Tabs (Lisinopril-Hydrochlorothiazide) .Marland Kitchen.. 1 Tablet Daily 5)  Flaxseed Oil 1000 Mg  Caps (Flaxseed (Linseed)) .Marland Kitchen.. 1 Daily 6)  Bayer Low Strength 81 Mg Tbec (Aspirin) .Marland Kitchen.. 1 Daily By Mouth 7)  Vitamin D 1000 Unit Caps (Cholecalciferol) .Marland Kitchen.. 1 Daily  By Mouth  Allergies: No Known Drug Allergies  Physical Exam  General:  Well-developed,well-nourished,in no acute distress; alert,appropriate and cooperative throughout examination Head:  Normocephalic and atraumatic without obvious abnormalities. No apparent alopecia. Typical male pattern  balding. Eyes:  Conjunctiva clear bilaterally.  Ears:  External ear exam shows no significant lesions or deformities.  Otoscopic examination reveals clear canals, tympanic membranes are intact bilaterally without bulging, retraction, inflammation or discharge. Hearing is grossly normal bilaterally. Nose:  External nasal examination shows no deformity or inflammation. Nasal mucosa are pink and moist without lesions or exudates. Mouth:  Oral mucosa and oropharynx without lesions or exudates.  Teeth in good repair. Neck:  No deformities, masses, or tenderness noted. Chest Wall:  No deformities, masses, tenderness or gynecomastia noted. Barrel chested. Lungs:  Normal respiratory effort, chest expands symmetrically. Lungs are clear to auscultation, no crackles or wheezes. Heart:  Normal rate and regular rhythm. S1 and S2 normal without gallop, murmur, click, rub or other extra sounds. Abdomen:  Bowel sounds positive,abdomen soft and non-tender without masses, organomegaly. Longstanding left inguinal hernia tyhat has enlarged and chronically descended. Neurologic:  No cranial nerve deficits noted. Station and gait are normal. Sensory, motor and coordinative functions appear intact. Skin:  Intact without suspicious lesions or rashes Psych:  Cognition and judgment appear intact. Alert and cooperative with normal attention span and concentration. No apparent delusions, illusions, hallucinations   Impression & Recommendations:  Problem # 1:  HYPOKALEMIA (ICD-276.8) Assessment New Appears resolved. Will recheck electrolytes. Cont curr meds. Will stop K if normalized and recheck 2 weeks later.  Problem # 2:   INGUINAL HERNIA, LEFT (ICD-550.90) Assessment: Unchanged Wants to have repair done. Had told Dr Daphine Deutscher he wanted to wait. Told him to call Dr Daphine Deutscher and let him know.  Problem # 3:  HYPERTENSION (ICD-401.9) Assessment: Improved Better, cont curr meds. His updated medication list for this problem includes:    Lisinopril-hydrochlorothiazide 20-25 Mg Tabs (Lisinopril-hydrochlorothiazide) .Marland Kitchen... 1 tablet daily  Orders: TLB-BMP (Basic Metabolic Panel-BMET) (80048-METABOL)  BP today: 122/70 Prior BP: 140/90 (09/04/2009)  Labs Reviewed: K+: 4.4 (02/13/2009) Creat: : 1.3 (02/13/2009)   Chol: 174 (02/13/2009)   HDL: 36.90 (02/13/2009)   LDL: 117 (02/13/2009)   TG: 101.0 (02/13/2009)  Complete Medication List: 1)  Nexium 40 Mg Cpdr (Esomeprazole magnesium) .Marland Kitchen.. 1 tablet daily 2)  Lisinopril-hydrochlorothiazide 20-25 Mg Tabs (Lisinopril-hydrochlorothiazide) .Marland Kitchen.. 1 tablet daily 3)  Flaxseed Oil 1000 Mg Caps (Flaxseed (linseed)) .Marland Kitchen.. 1 daily 4)  Bayer Low Strength 81 Mg Tbec (Aspirin) .Marland Kitchen.. 1 daily by mouth 5)  Vitamin D 1000 Unit Caps (Cholecalciferol) .Marland Kitchen.. 1 daily by mouth 6)  Potassium Chloride 20 Meq Pack (Potassium chloride) .... Take one by mouth daily 7)  Loratadine 10 Mg Tabs (Loratadine) .... Take one by mouth daily  Other Orders: TLB-CBC Platelet - w/Differential (85025-CBCD) Flu Vaccine 39yrs + MEDICARE PATIENTS (Z6109) Administration Flu vaccine - MCR (U0454)  Patient Instructions: 1)  Labs today, will call. 2)  Call Dr Daphine Deutscher.  3)  Cont curr meds. Prescriptions: NEXIUM 40 MG CPDR (ESOMEPRAZOLE MAGNESIUM) 1 tablet daily  #30 Capsule x 11   Entered and Authorized by:   Shaune Leeks MD   Signed by:   Shaune Leeks MD on 05/14/2010   Method used:   Electronically to        [facility] [address] [phone]* (retail)       [address]       [place], [place]  [address]       Ph: [phone] or [phone]       Fax: [phone]   RxID:    [id number] POTASSIUM CHLORIDE 20 MEQ PACK (POTASSIUM CHLORIDE) Take one by mouth daily  #90 x 3   Entered and Authorized by:   Shaune Leeks MD   Signed by:   Shaune Leeks MD on 05/14/2010   Method used:   [facility]        [facility] [address] [phone]* (retail)       [address]       [place], [place]  [address]       Ph: [phone] or [phone]       Fax: [phone]   RxID:   [id number] LISINOPRIL-HYDROCHLOROTHIAZIDE 20-25 MG TABS (LISINOPRIL-HYDROCHLOROTHIAZIDE) 1 tablet daily  #90 x 3   Entered and Authorized by:   Shaune Leeks MD   Signed by:   Shaune Leeks MD on 05/14/2010   Method used:   [facility]ronically to        [facility] [address] [phone]* (retail)       [address]       [place], [place]  [address]       Ph: [phone] or [phone]  Fax: [phone]   RxID:   [id number]   Current Allergies (reviewed today): No known allergies   Flu Vaccine Consent Questions     Do you have a history of severe allergic reactions to this vaccine? no    Any prior history of allergic reactions to egg and/or gelatin? no    Do you have a sensitivity to the preservative Thimersol? no    Do you have a past history of Guillan-Barre Syndrome? no    Do you currently have an acute febrile illness? no    Have you ever had a severe reaction to latex? no    Vaccine information given and explained to patient? yes    Are you currently pregnant? no    Lot Number:[id number]   Exp Date:02/06/2011   Site Given  Left Deltoid IMflu

## 2010-09-08 NOTE — Assessment & Plan Note (Signed)
Summary: FOLLOW UP / LFW   Vital Signs:  Patient profile:   77 year old male Weight:      179 pounds Temp:     98.6 degrees F oral Pulse rate:   68 / minute Pulse rhythm:   regular BP sitting:   140 / 90  (left arm) Cuff size:   regular  Vitals Entered By: Sydell Axon LPN (September 04, 2009 3:24 PM) CC: 6 Month follow-up, has been out of his BP medication since Saturday   History of Present Illness: Pt here for 6 month followup.   Allergies: No Known Drug Allergies   Impression & Recommendations:  Problem # 1:  HYPERTENSION (ICD-401.9) Assessment Unchanged Get back on meds. Is expecting his medication from the [place] any time now. Knows not to miss. His updated medication list for this problem includes:    Lisinopril-hydrochlorothiazide 20-25 Mg Tabs (Lisinopril-hydrochlorothiazide) .Marland Kitchen... 1 tablet daily  BP today: 140/90 Prior BP: 120/70 (02/17/2009)  Labs Reviewed: K+: 4.4 (02/13/2009) Creat: : 1.3 (02/13/2009)   Chol: 174 (02/13/2009)   HDL: 36.90 (02/13/2009)   LDL: 117 (02/13/2009)   TG: 101.0 (02/13/2009)  Problem # 2:  INGUINAL HERNIA, LEFT (ICD-550.90) Assessment: Unchanged Disdcussed. Probably should have it fixed, assx but fairly large.  Problem # 3:  COMPRESSION FRACTURE, L 3  VERTEBRAE (ICD-805.4) Assessment: Unchanged CVont Boniva, samp given.  Complete Medication List: 1)  Boniva 150 Mg Tabs (Ibandronate sodium) .... Take one by mouth monthly 2)  Nexium 40 Mg Cpdr (Esomeprazole magnesium) .Marland Kitchen.. 1 tablet daily 3)  Klor-con M10 10 Meq Tbcr (Potassium chloride crys cr) .Marland Kitchen.. 1 tablet daily 4)  Lisinopril-hydrochlorothiazide 20-25 Mg Tabs (Lisinopril-hydrochlorothiazide) .Marland Kitchen.. 1 tablet daily 5)  Flaxseed Oil 1000 Mg Caps (Flaxseed (linseed)) .Marland Kitchen.. 1 daily 6)  Bayer Low Strength 81 Mg Tbec (Aspirin) .Marland Kitchen.. 1 daily by mouth 7)  Vitamin D 1000 Unit Caps (Cholecalciferol) .Marland Kitchen.. 1 daily by mouth  Patient Instructions: 1)  RTC for Comp Exam 7/11. Call in  May.  Current Allergies (reviewed today): No known allergies

## 2010-09-10 ENCOUNTER — Encounter: Payer: Self-pay | Admitting: Family Medicine

## 2010-09-10 NOTE — Miscellaneous (Signed)
Summary: Referral/[facility]  Referral/[facility]   Imported By: Lanelle Bal 08/26/2010 11:03:55  _____________________________________________________________________  External Attachment:    Type:   Image     Comment:   External Document

## 2010-09-10 NOTE — Progress Notes (Signed)
Summary: heart rate below 50  Phone Note Call from Patient   Caller: Ava- RN w/ [facility] [phone] Call For: Shaune Leeks MD Summary of Call: Ava is at patients home today and patien't heart rate has been 50 and below since she has been there. Right now it is 45. She says that patient doen't feel weak, not having any other symptoms. Please advise  Initial call taken by: Melody Comas,  September 01, 2010 1:22 PM  Follow-up for Phone Call        if pt feels fine - that is ok  update if any symptoms or if heart rate goes lower  I will also foward this to Dr Hetty Ely Follow-up by: Judith Part MD,  September 01, 2010 1:44 PM  Additional Follow-up for Phone Call Additional follow up Details #1::        Left message forAva with [facility] [phone]  to call back. Lewanda Rife LPN  September 01, 2010 1:52 PM     Additional Follow-up for Phone Call Additional follow up Details #2::    Pls have pt see me next avail visit convenient for him. Follow-up by: Shaune Leeks MD,  September 01, 2010 2:17 PM  Additional Follow-up for Phone Call Additional follow up Details #3:: Details for Additional Follow-up Action Taken: Called Ava and left message on machine to call back. Sydell Axon LPN  September 01, 2010 2:35 PM  Advised Ava, she will tell pt to call to schedule appt.         Lowella Petties CMA, AAMA  September 02, 2010 12:21 PM

## 2010-09-10 NOTE — Letter (Signed)
Summary: [facility]   Imported By: Lanelle Bal 08/19/2010 13:03:47  _____________________________________________________________________  External Attachment:    Type:   Image     Comment:   External Document

## 2010-09-10 NOTE — Progress Notes (Signed)
Summary: Surgery/needs help  Phone Note Call from Patient Call back at [phone]   Caller: Daughter/[name] Lewis Call For: Shaune Leeks MD Summary of Call: Patient had surgery at [facility] and was released from the hospital Saturday. Daughter states that he lives alone and she and her brother work and  needs some help taking care of her dad until he has the staples taken out and can start back lifting things and doing things on his own. Daughter stated that she called the surgeon's office to see about getting some help to come into the house and was told to call here that the PCP would have to get this set up for the patient.   Initial call taken by: Sydell Axon LPN,  August 17, 2010 11:47 AM  Follow-up for Phone Call        done.  Follow-up by: Crawford Givens MD,  August 17, 2010 1:36 PM

## 2010-09-16 NOTE — Letter (Signed)
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

## 2010-09-24 NOTE — Miscellaneous (Signed)
Summary: Plan of Treatment  Plan of Treatment   Imported By: Kassie Mends 09/15/2010 08:12:31  _____________________________________________________________________  External Attachment:    Type:   Image     Comment:   External Document

## 2010-09-24 NOTE — Miscellaneous (Signed)
Summary: [facility]   [facility]   Imported By: Kassie Mends 09/15/2010 08:09:52  _____________________________________________________________________  External Attachment:    Type:   Image     Comment:   External Document

## 2010-09-24 NOTE — Miscellaneous (Signed)
Summary: [facility] Order  [facility] Order   Imported By: Kassie Mends 09/16/2010 11:18:27  _____________________________________________________________________  External Attachment:    Type:   Image     Comment:   External Document

## 2010-09-30 NOTE — Letter (Signed)
Summary: Dr Daphine Deutscher Office Visit   Dr Daphine Deutscher Office Visit   Imported By: Kassie Mends 09/22/2010 10:26:06  _____________________________________________________________________  External Attachment:    Type:   Image     Comment:   External Document

## 2010-10-22 LAB — POCT I-STAT, CHEM 8
Chloride: 105 mEq/L (ref 96–112)
HCT: 41 % (ref 39.0–52.0)
Potassium: 2.9 mEq/L — ABNORMAL LOW (ref 3.5–5.1)
Sodium: 140 mEq/L (ref 135–145)

## 2010-10-22 LAB — DIFFERENTIAL
Basophils Absolute: 0 10*3/uL (ref 0.0–0.1)
Lymphocytes Relative: 27 % (ref 12–46)
Lymphs Abs: 2.3 10*3/uL (ref 0.7–4.0)
Neutro Abs: 5.1 10*3/uL (ref 1.7–7.7)

## 2010-10-22 LAB — URINALYSIS, ROUTINE W REFLEX MICROSCOPIC
Glucose, UA: NEGATIVE mg/dL
Nitrite: NEGATIVE
Specific Gravity, Urine: 1.02 (ref 1.005–1.030)
pH: 5 (ref 5.0–8.0)

## 2010-10-22 LAB — COMPREHENSIVE METABOLIC PANEL
ALT: 21 U/L (ref 0–53)
Calcium: 8.8 mg/dL (ref 8.4–10.5)
GFR calc Af Amer: >60 mL/min (ref >60)
Glucose, Bld: 122 mg/dL — ABNORMAL HIGH (ref 70–99)
Sodium: 136 mEq/L (ref 135–145)
Total Protein: 7 g/dL (ref 6.0–8.3)

## 2010-10-22 LAB — CARDIAC PANEL(CRET KIN+CKTOT+MB+TROPI)
CK, MB: 3.5 ng/mL (ref 0.3–4.0)
Relative Index: INVALID (ref 0.0–2.5)
Total CK: 45 U/L (ref 7–232)
Troponin I: 0.04 ng/mL (ref 0.00–0.06)

## 2010-10-22 LAB — BASIC METABOLIC PANEL
CO2: 27 mEq/L (ref 19–32)
CO2: 27 mEq/L (ref 19–32)
Calcium: 8.9 mg/dL (ref 8.4–10.5)
Chloride: 108 mEq/L (ref 96–112)
GFR calc Af Amer: >60 mL/min (ref >60)
GFR calc Af Amer: >60 mL/min (ref >60)
GFR calc non Af Amer: >60 mL/min (ref >60)
Potassium: 3.5 mEq/L (ref 3.5–5.1)
Sodium: 139 mEq/L (ref 135–145)
Sodium: 141 mEq/L (ref 135–145)

## 2010-10-22 LAB — CBC
HCT: 38 % — ABNORMAL LOW (ref 39.0–52.0)
HCT: 40.4 % (ref 39.0–52.0)
MCHC: 34.7 g/dL (ref 30.0–36.0)
Platelets: 165 10*3/uL (ref 150–400)
Platelets: 190 10*3/uL (ref 150–400)
RBC: 4.16 MIL/uL — ABNORMAL LOW (ref 4.22–5.81)
RDW: 13.5 % (ref 11.5–15.5)
RDW: 13.6 % (ref 11.5–15.5)
WBC: 8.7 10*3/uL (ref 4.0–10.5)

## 2010-10-22 LAB — LIPID PANEL
Cholesterol: 117 mg/dL (ref 0–200)
HDL: 33 mg/dL — ABNORMAL LOW (ref >39)

## 2010-10-22 LAB — PROTIME-INR: Prothrombin Time: 13.3 seconds (ref 11.6–15.2)

## 2010-10-22 LAB — HEMOGLOBIN A1C: Mean Plasma Glucose: 100 mg/dL (ref <117)

## 2010-12-22 NOTE — Assessment & Plan Note (Signed)
[facility]                         GASTROENTEROLOGY OFFICE NOTE   NAME:Christopher [name], Christopher [name]                        MRN:          [id number]  DATE:04/18/2007                            DOB:          [birth year]    PRIMARY CARE PHYSICIAN:  Dr. Hetty Ely.   PRIMARY GASTROENTEROLOGIST:  Dr. Claudette Head.   GASTROINTESTINAL PROBLEM LIST:  History of large hiatal hernia,  confirmed by esophagogram as well as EGD by Dr. Claudette Head January  2006.   INTERVAL HISTORY:  Christopher [name] was last seen here January 2006 by Dr.  Russella Dar. He was instructed about staying on his PPI once daily and  maintaining standard antireflux measures. Since then, he was found to be  heme positive by Dr. Hetty Ely on multiple stool tests. The patient has  not seen any overt GI bleeding. He has no constipation or diarrhea.  There is no abdominal pain. He has, however, lost 10-15 pounds  unintentionally recently.   CURRENT MEDICATIONS:  1. Omega 3.  2. Flax seed oil.  3. Lisinopril.  4. Klor-Con.  5. Boniva.  6. Nexium.   PHYSICAL EXAMINATION:  Weight 169 pounds which is down 8 pounds since  his visit 2 years ago. Blood pressure 124/82, pulse 88.  CONSTITUTIONAL:  Generally well appearing.  LUNGS:  Clear to auscultation bilaterally.  HEART:  Regular rate and rhythm.  ABDOMEN:  Soft, nontender, nondistended, normal bowel sounds.   ASSESSMENT AND PLAN:  An 77 year old man with heme-positive stool.   Christopher [name] does not have family history of colon cancer. He has never had  a full colonoscopy from what I can tell. He did have what sounds like a  sigmoidoscopy in Dr. Lorenza Chick office 5 or 6 years ago. He did not  appear to be anemic clinically, but I will get a CBC to document that. I  will  also arrange for him to have a colonoscopy performed at his soonest  convenience with Dr. Claudette Head who knows him from previous visits.     Rachael Fee, MD  Electronically  Signed    DPJ/MedQ  DD: 04/18/2007  DT: 04/19/2007  Job #: [id number]   cc:   Venita Lick. Russella Dar, MD, Clementeen Graham  Arta Silence, MD

## 2010-12-25 NOTE — Procedures (Signed)
[facility]                              EXERCISE TREADMILL   NAME:Christopher [name], Christopher [name]                        MRN:          [id number]  DATE:09/12/2006                            DOB:          11-22-1923    PRIMARY CARE PHYSICIAN:  Dr. Hetty Ely.   PROCEDURE:  Exercise treadmill test.   INDICATIONS FOR PROCEDURE:  Evaluate patient with multiple  cardiovascular risk factors and some dyspnea.   DESCRIPTION OF PROCEDURE:  The patient was exercised using a modified  Bruce protocol. He was able to exercise for 10 minutes and 30 seconds.  This completed 3 stages and extended into stage 4 of the modified  protocol. He achieved 6.4 mets. He achieved a peak heart rate of 134  which was 95% of predicted. He had an appropriate, slightly accelerated  blood pressure response with a maximum of 175/104. He had very few  premature ventricular beats during exercise. He had no chest pain. The  test was terminated because he had achieved his target heart rate and  because of fatigue. He had a normal heart rate recovery. There were no  ischemic ST-T wave changes.   CONCLUSION:  Negative adequate exercise treadmill test with a moderate  exercise tolerance given his age. There are no findings to indicate high-  grade obstructive coronary disease.   PLAN:  Based on the above, no further cardiovascular testing is  suggested. He had a normal chronotropic response noted. Based on this,  no further testing is warranted but he should participate in primary  risk reduction. I did prescribe for him an exercise regimen based on the  above. Followup with me can be as needed. He will continue to follow  with Dr. Hetty Ely.     Rollene Rotunda, MD, [facility]  Electronically Signed    JH/MedQ  DD: 09/12/2006  DT: 09/12/2006  Job #: [id number]   cc:   Arta Silence, MD

## 2010-12-25 NOTE — Assessment & Plan Note (Signed)
[facility]                            CARDIOLOGY OFFICE NOTE   Christopher [name], Christopher [name]                        MRN:          [id number]  DATE:08/03/2006                            DOB:          [birth year]    PRIMARY CARE PHYSICIAN:  Arta Silence, M.D.   REASON FOR PRESENTATION:  Patient with multiple cardiovascular risk  factors and hypertension.   HISTORY OF PRESENT ILLNESS:  The patient is a very pleasant 77 year old  gentleman with no prior cardiac history, but significant risk factors.  He unfortunately is limited in his activities because he has to take  care of a wife with Alzheimer's.  He does not get out very much, but he  does have an exercise bicycle in his house, but does not pedal it.  With  his activities of daily living, which include vacuuming, he denies any  chest discomfort, neck discomfort, arm discomfort, activity-induced  nausea or vomiting or excessive diaphoresis.  He will occasionally get  short of breath with this activity, but denies any resting shortness of  breath and has no PND or orthopnea.  He has some rare palpitations, but  no sustained tachyarrhythmias, no presyncope or syncope.   PAST MEDICAL HISTORY:  Hypertension since the 1940s, hyperlipidemia  times three years, pancreatitis, gastroesophageal reflux disease,  elevated PSA, osteoporosis, chronic back pain.   PAST SURGICAL HISTORY:  Cholecystectomy.   ALLERGIES:  None.   MEDICATIONS:  1. Klor-Con 10 mEq daily.  2. Allegra.  3. Hydrocodone.  4. Lisinopril HCT 20/25 daily.  5. Mega.   SOCIAL HISTORY:  The patient is married.  He has two children.  He has  not smoked since the 1930s.  He does not drink alcohol.   FAMILY HISTORY:  Noncontributory for early coronary disease, though he  is not sure why his father died in his late fifties.  He has two older  sisters with later-onset heart disease.   REVIEW OF SYSTEMS:  As stated in the HPI and otherwise  positive for some  night-time leg cramps, mild ankle swelling, chronic low back pain,  negative for other systems.   PHYSICAL EXAMINATION:  The patient is in no distress.  Blood pressure  144/90, heart rate 52 and regular.  Weight 170 pounds.  HEENT:  Eyelids unremarkable.  Pupils equal, round and reactive to  light.  Fundi within normal limits.  Oral mucosa unremarkable.  NECK:  No jugular venous distention at 45 degrees.  Carotid upstroke  brisk and symmetric.  No bruits, no thyromegaly.  LYMPHATICS:  No cervical, axillary or inguinal adenopathy.  LUNGS:  Clear to auscultation bilaterally.  BACK:  No costovertebral angle tenderness.  CHEST:  Unremarkable.  HEART:  PMI not displaced or sustained.  S1 and S2 within normal limits.  No S3, no S4, 2/6 apical systolic murmur, radiating slightly out the  aortic outflow tract.  No diastolic murmurs.  ABDOMEN:  Flat, positive bowel sounds, normal in frequency and pitch.  No bruits, rebound, guarding, midline pulsatile mass, hepatomegaly,  splenomegaly.  SKIN:  No rashes, no nodules.  EXTREMITIES:  2+ pulses throughout, no edema, no cyanosis or clubbing.  NEUROLOGIC:  Oriented to person, place and time.  Cranial nerves II  through XII grossly intact.  Motor grossly intact.   EKG:  Sinus bradycardia, rate 47, axis within normal limits, first  degree AV block, borderline voltage criteria for left ventricular  hypertrophy, no acute STT wave changes.   ASSESSMENT AND PLAN:  1. The patient's only complaint that could be an anginal equivalent is      dyspnea.  He has cardiovascular risk factors.  I do not think he      has a high pre-test probability of obstructive coronary disease.      However, I think exercise treadmill testing would be very useful to      risk stratify him, rule out coronary disease and, most importantly,      give him prescription for exercise.  I will bring him back to do      this.  2. Hypertension:  The patient has a  borderline blood pressure.  I will      be able to assess this at the time of his treadmill and up-titrate      his medications if he has a hypertensive blood pressure response.  3. Dyslipidemia:  The patient has a mildly elevated LDL and a low HDL.      This could be best treated with exercise.  4. Bradycardia:  He is not symptomatic with this.  I will make sure he      is chronotropic competent at the time of his treadmill.  5. Followup will be at the time of his treadmill.     Rollene Rotunda, MD, [facility]  Electronically Signed    JH/MedQ  DD: 08/03/2006  DT: 08/03/2006  Job #: [id number]   cc:   Arta Silence, MD

## 2011-01-07 ENCOUNTER — Encounter: Payer: Self-pay | Admitting: Family Medicine

## 2011-01-14 ENCOUNTER — Other Ambulatory Visit: Payer: Self-pay | Admitting: Family Medicine

## 2011-01-14 DIAGNOSIS — E785 Hyperlipidemia, unspecified: Secondary | ICD-10-CM

## 2011-01-14 DIAGNOSIS — E876 Hypokalemia: Secondary | ICD-10-CM

## 2011-01-14 DIAGNOSIS — K869 Disease of pancreas, unspecified: Secondary | ICD-10-CM

## 2011-01-14 DIAGNOSIS — M810 Age-related osteoporosis without current pathological fracture: Secondary | ICD-10-CM

## 2011-01-14 DIAGNOSIS — R972 Elevated prostate specific antigen [PSA]: Secondary | ICD-10-CM

## 2011-01-14 DIAGNOSIS — K5730 Diverticulosis of large intestine without perforation or abscess without bleeding: Secondary | ICD-10-CM

## 2011-02-04 ENCOUNTER — Other Ambulatory Visit (INDEPENDENT_AMBULATORY_CARE_PROVIDER_SITE_OTHER): Payer: Medicare Other

## 2011-02-04 DIAGNOSIS — R972 Elevated prostate specific antigen [PSA]: Secondary | ICD-10-CM

## 2011-02-04 DIAGNOSIS — K869 Disease of pancreas, unspecified: Secondary | ICD-10-CM

## 2011-02-04 DIAGNOSIS — E876 Hypokalemia: Secondary | ICD-10-CM

## 2011-02-04 DIAGNOSIS — M810 Age-related osteoporosis without current pathological fracture: Secondary | ICD-10-CM

## 2011-02-04 DIAGNOSIS — K5730 Diverticulosis of large intestine without perforation or abscess without bleeding: Secondary | ICD-10-CM

## 2011-02-04 DIAGNOSIS — E785 Hyperlipidemia, unspecified: Secondary | ICD-10-CM

## 2011-02-04 LAB — CBC WITH DIFFERENTIAL/PLATELET
Basophils Absolute: 0 10*3/uL (ref 0.0–0.1)
Basophils Relative: 0.4 % (ref 0.0–3.0)
Eosinophils Absolute: 0.2 10*3/uL (ref 0.0–0.7)
Lymphocytes Relative: 31.4 % (ref 12.0–46.0)
MCHC: 34.2 g/dL (ref 30.0–36.0)
MCV: 95 fl (ref 78.0–100.0)
Monocytes Absolute: 0.6 10*3/uL (ref 0.1–1.0)
Neutrophils Relative %: 54.8 % (ref 43.0–77.0)
Platelets: 153 10*3/uL (ref 150.0–400.0)
RDW: 13.2 % (ref 11.5–14.6)

## 2011-02-04 LAB — LIPASE: Lipase: 29 U/L (ref 11.0–59.0)

## 2011-02-04 LAB — LIPID PANEL
Cholesterol: 171 mg/dL (ref 0–200)
LDL Cholesterol: 114 mg/dL — ABNORMAL HIGH (ref 0–99)
Triglycerides: 95 mg/dL (ref 0.0–149.0)
VLDL: 19 mg/dL (ref 0.0–40.0)

## 2011-02-04 LAB — RENAL FUNCTION PANEL
Albumin: 3.8 g/dL (ref 3.5–5.2)
Calcium: 9.2 mg/dL (ref 8.4–10.5)
Creatinine, Ser: 1.3 mg/dL (ref 0.4–1.5)
Glucose, Bld: 89 mg/dL (ref 70–99)
Phosphorus: 3.7 mg/dL (ref 2.3–4.6)

## 2011-02-04 LAB — HEPATIC FUNCTION PANEL
ALT: 9 U/L (ref 0–53)
Albumin: 3.8 g/dL (ref 3.5–5.2)
Alkaline Phosphatase: 49 U/L (ref 39–117)
Bilirubin, Direct: 0.1 mg/dL (ref 0.0–0.3)
Total Protein: 6.9 g/dL (ref 6.0–8.3)

## 2011-02-04 LAB — PSA: PSA: 8.47 ng/mL — ABNORMAL HIGH (ref 0.10–4.00)

## 2011-02-04 LAB — AMYLASE: Amylase: 103 U/L (ref 27–131)

## 2011-02-05 LAB — VITAMIN D 25 HYDROXY (VIT D DEFICIENCY, FRACTURES): Vit D, 25-Hydroxy: 52 ng/mL (ref 30–89)

## 2011-02-11 ENCOUNTER — Encounter: Payer: Self-pay | Admitting: Family Medicine

## 2011-02-11 ENCOUNTER — Ambulatory Visit (INDEPENDENT_AMBULATORY_CARE_PROVIDER_SITE_OTHER): Payer: Medicare Other | Admitting: Family Medicine

## 2011-02-11 DIAGNOSIS — K5730 Diverticulosis of large intestine without perforation or abscess without bleeding: Secondary | ICD-10-CM

## 2011-02-11 DIAGNOSIS — I10 Essential (primary) hypertension: Secondary | ICD-10-CM

## 2011-02-11 DIAGNOSIS — K4090 Unilateral inguinal hernia, without obstruction or gangrene, not specified as recurrent: Secondary | ICD-10-CM

## 2011-02-11 DIAGNOSIS — E876 Hypokalemia: Secondary | ICD-10-CM

## 2011-02-11 DIAGNOSIS — R972 Elevated prostate specific antigen [PSA]: Secondary | ICD-10-CM

## 2011-02-11 DIAGNOSIS — K869 Disease of pancreas, unspecified: Secondary | ICD-10-CM

## 2011-02-11 DIAGNOSIS — K648 Other hemorrhoids: Secondary | ICD-10-CM

## 2011-02-11 DIAGNOSIS — Z0000 Encounter for general adult medical examination without abnormal findings: Secondary | ICD-10-CM

## 2011-02-11 DIAGNOSIS — K219 Gastro-esophageal reflux disease without esophagitis: Secondary | ICD-10-CM

## 2011-02-11 DIAGNOSIS — E785 Hyperlipidemia, unspecified: Secondary | ICD-10-CM

## 2011-02-11 DIAGNOSIS — K449 Diaphragmatic hernia without obstruction or gangrene: Secondary | ICD-10-CM

## 2011-02-11 NOTE — Assessment & Plan Note (Signed)
Repaired successfully.

## 2011-02-11 NOTE — Assessment & Plan Note (Signed)
Resolved

## 2011-02-11 NOTE — Assessment & Plan Note (Signed)
Discussed being seen for prolonged LLQ pain.

## 2011-02-11 NOTE — Assessment & Plan Note (Signed)
Appears resolved

## 2011-02-11 NOTE — Assessment & Plan Note (Signed)
Well controlled. Cont curr meds. BP Readings from Last 3 Encounters:  02/11/11 104/68  07/16/10 126/70  07/08/10 134/88

## 2011-02-11 NOTE — Assessment & Plan Note (Signed)
Stable

## 2011-02-11 NOTE — Assessment & Plan Note (Signed)
Decent control. Cont curr meds. Lab Results  Component Value Date   CHOL 171 02/04/2011   CHOL 181 06/25/2010   CHOL  Value: 117        ATP III CLASSIFICATION:  <200     mg/dL   Desirable  045-409  mg/dL   Borderline High  >=811    mg/dL   High        04/09/4781   Lab Results  Component Value Date   HDL 38.20* 02/04/2011   HDL 37.20* 06/25/2010   HDL 33* 04/17/2010   Lab Results  Component Value Date   LDLCALC 114* 02/04/2011   LDLCALC 117* 06/25/2010   LDLCALC  Value: 57        Total Cholesterol/HDL:CHD Risk Coronary Heart Disease Risk Table                     Men   Women  1/2 Average Risk   3.4   3.3  Average Risk       5.0   4.4  2 X Average Risk   9.6   7.1  3 X Average Risk  23.4   11.0        Use the calculated Patient Ratio above and the CHD Risk Table to determine the patient's CHD Risk.        ATP III CLASSIFICATION (LDL):  <100     mg/dL   Optimal  956-213  mg/dL   Near or Above                    Optimal  130-159  mg/dL   Borderline  086-578  mg/dL   High  >469     mg/dL   Very High 01/09/9527   Lab Results  Component Value Date   TRIG 95.0 02/04/2011   TRIG 135.0 06/25/2010   TRIG 134 04/17/2010   Lab Results  Component Value Date   CHOLHDL 4 02/04/2011   CHOLHDL 5 06/25/2010   CHOLHDL 3.5 04/17/2010   No results found for this basename: LDLDIRECT

## 2011-02-11 NOTE — Progress Notes (Signed)
Subjective:    Patient ID: Christopher [name], male    DOB: [birth year], 77 y.o.   MRN: [id number]  HPI Pt here for Comp Exam. He feels well and has no complaints. In the last 2-3 months he has lost pounds without reason. He lives by himself but enjoys cooking. He admits to being lonely and feels he is eating a reasonable amount. He is not aware of cancer in the family. Most has been HBP and heart.  His weight last year was 169. 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  He doesw not feel or think he is depressed.    Review of Systems  Constitutional: Positive for unexpected weight change (7 pounds since last year. ). Negative for fever, chills, diaphoresis, appetite change and fatigue.  HENT: Positive for hearing loss (Has not had hearing eval...was suggested  today.). Negative for ear pain, tinnitus and ear discharge.   Eyes: Negative for pain, discharge and visual disturbance.  Respiratory: Positive for shortness of breath (rarely.). Negative for cough and wheezing.   Cardiovascular: Negative for chest pain and palpitations.       No SOB w/ exertion  Gastrointestinal: Negative for nausea, vomiting, abdominal pain, diarrhea, constipation and blood in stool.       No heartburn or swallowing problems. Has somre pain with eating raw salads and raw vegetables.  Genitourinary: Negative for dysuria, frequency and difficulty urinating.       No nocturia  Musculoskeletal: Positive for arthralgias (generalized in morning, takes Tyl occas with good results.). Negative for myalgias and back pain.  Skin: Negative for rash.       No itching or dryness.  Neurological: Negative for tremors and numbness.       No tingling but some  balance problem with quick movements.   Hematological: Negative for adenopathy. Does not bruise/bleed easily.  Psychiatric/Behavioral: Negative for dysphoric mood and agitation.       Objective:   Physical Exam  Constitutional: He is oriented to person, place, and time. He  appears well-developed and well-nourished. No distress.  HENT:  Head: Normocephalic and atraumatic.  Right Ear: External ear normal.  Left Ear: External ear normal.  Nose: Nose normal.  Mouth/Throat: Oropharynx is clear and moist.  Eyes: Conjunctivae and EOM are normal. Pupils are equal, round, and reactive to light. Right eye exhibits no discharge. Left eye exhibits no discharge. No scleral icterus.  Neck: Normal range of motion. Neck supple. No thyromegaly present.  Cardiovascular: Normal rate, regular rhythm, normal heart sounds and intact distal pulses.   No murmur heard. Pulmonary/Chest: Effort normal and breath sounds normal. No respiratory distress. He has no wheezes.  Abdominal: Soft. Bowel sounds are normal. He exhibits no distension and no mass. There is no tenderness. There is no rebound and no guarding.  Genitourinary: Rectum normal, prostate normal and penis normal. Guaiac negative stool.       Prostate 40gms but smooth and symmetric.  Musculoskeletal: Normal range of motion. He exhibits no edema.  Lymphadenopathy:    He has no cervical adenopathy.  Neurological: He is alert and oriented to person, place, and time. Coordination normal.  Skin: Skin is warm and dry. No rash noted. He is not diaphoretic.  Psychiatric: He has a normal mood and affect. His behavior is normal. Judgment and thought content normal.          Assessment & Plan:  HMPE  I have personally reviewed the Medicare Annual Wellness questionnaire and have noted 1.  The patient's medical and social history 2. Their use of alcohol, tobacco or illicit drugs 3. Their current medications and supplements 4. The patient's functional ability including ADL's, fall risks, home safety risks and hearing or visual             impairment. 5. Diet and physical activities 6. Evidence for depression or mood disorders

## 2011-02-11 NOTE — Assessment & Plan Note (Signed)
Continues but does not want further investigation. At his age, I concur. Lab Results  Component Value Date   PSA 8.47* 02/04/2011   PSA 4.99* 02/13/2009   PSA 6.65* 02/12/2008

## 2011-02-11 NOTE — Assessment & Plan Note (Signed)
No problems, decompressed on today's exam.

## 2011-02-11 NOTE — Patient Instructions (Signed)
RTC 6 mos for recheck 

## 2011-05-15 ENCOUNTER — Other Ambulatory Visit: Payer: Self-pay | Admitting: Family Medicine

## 2011-06-08 ENCOUNTER — Ambulatory Visit (INDEPENDENT_AMBULATORY_CARE_PROVIDER_SITE_OTHER): Payer: Medicare Other

## 2011-06-08 DIAGNOSIS — Z23 Encounter for immunization: Secondary | ICD-10-CM

## 2011-06-15 ENCOUNTER — Other Ambulatory Visit: Payer: Self-pay | Admitting: *Deleted

## 2011-06-15 MED ORDER — LISINOPRIL-HYDROCHLOROTHIAZIDE 20-25 MG PO TABS: 1.0000 | ORAL_TABLET | Freq: Every day | ORAL | Status: DC

## 2011-08-14 ENCOUNTER — Other Ambulatory Visit: Payer: Self-pay | Admitting: Family Medicine

## 2011-08-18 ENCOUNTER — Ambulatory Visit: Payer: Medicare Other | Admitting: Family Medicine

## 2011-08-18 ENCOUNTER — Encounter: Payer: Self-pay | Admitting: Family Medicine

## 2011-08-18 ENCOUNTER — Ambulatory Visit (INDEPENDENT_AMBULATORY_CARE_PROVIDER_SITE_OTHER): Payer: Medicare Other | Admitting: Family Medicine

## 2011-08-18 VITALS — BP 132/80 | HR 72 | Temp 97.8°F | Ht 70.0 in | Wt 167.2 lb

## 2011-08-18 DIAGNOSIS — I10 Essential (primary) hypertension: Secondary | ICD-10-CM

## 2011-08-18 DIAGNOSIS — R238 Other skin changes: Secondary | ICD-10-CM

## 2011-08-18 DIAGNOSIS — L853 Xerosis cutis: Secondary | ICD-10-CM

## 2011-08-18 DIAGNOSIS — R972 Elevated prostate specific antigen [PSA]: Secondary | ICD-10-CM

## 2011-08-18 DIAGNOSIS — E785 Hyperlipidemia, unspecified: Secondary | ICD-10-CM

## 2011-08-18 LAB — BASIC METABOLIC PANEL
BUN: 17 mg/dL (ref 6–23)
CO2: 31 mEq/L (ref 19–32)
Calcium: 9 mg/dL (ref 8.4–10.5)
Creatinine, Ser: 1.4 mg/dL (ref 0.4–1.5)
Glucose, Bld: 90 mg/dL (ref 70–99)

## 2011-08-18 MED ORDER — LORATADINE 10 MG PO TABS: 10.0000 mg | ORAL_TABLET | Freq: Every day | ORAL | Status: DC

## 2011-08-18 NOTE — Assessment & Plan Note (Signed)
Only evident dry skin at ear, rec emollient.  If not improving, may return to see derm for check.

## 2011-08-18 NOTE — Assessment & Plan Note (Signed)
Stable control, continue meds as up to now. Check BMP today as endorsing some leg cramping.

## 2011-08-18 NOTE — Assessment & Plan Note (Signed)
Only on flax seed oil, last check adequate control. Recheck when returns fasting.

## 2011-08-18 NOTE — Patient Instructions (Addendum)
I've gone ahead and sent in claritin as prescription to see if will affect cost.  If not, check with pharmacist about any generic antihistamine for allergies (any should be fine). Return in 6 months for next wellness visit, return prior fasting for blood work. Good to see you today, call us with questions. Try eucerin cream to left ear.  If not improving, then could go see dermatologist for recheck. Blood work today to check potassium and kidneys.

## 2011-08-18 NOTE — Assessment & Plan Note (Signed)
Check PSA in 6 mo.

## 2011-08-18 NOTE — Progress Notes (Signed)
  Subjective:    Patient ID: Christopher [name], male    DOB: [birth year], 78 y.o.   MRN: [id number]  HPI CC: 6 mo f/u  Pleasant 78yo with h/o HTN, HLD, GERD, diverticulosis, osteoporosis and elevated PSA prior decided against further evaluation presents today for 6 mo f/u.  Last CPE was 02/2011.  Walking regularly.  Lives alone, thinks does well.  Doesn't need any meds refilled today.  H/o skin cancer left ear 4 yrs ago 0 has returned and gets sore sometimes.  Wonders if should return to see dermatologist again. Was taking claritin generic but has become very expensive  HTN - No HA, vision changes, CP/tightness, leg swelling. Compliant with meds.  Endorses some SOB at night.  Some leg cramps at night.  History osteoporosis - only on vit D. H/o elevated PSA, only watching per pt preference.   HLD - only on flax seed with adequate results. Lab Results  Component Value Date   LDLCALC 114* 02/04/2011   Weight up 5 lbs since last visit. Wt Readings from Last 3 Encounters:  08/18/11 167 lb 4 oz (75.864 kg)  02/11/11 162 lb (73.483 kg)  07/16/10 170 lb 8 oz (77.338 kg)    Review of Systems Per HPI    Objective:   Physical Exam  Vitals reviewed. Constitutional: He appears well-developed and well-nourished. No distress.  HENT:  Head: Normocephalic and atraumatic.  Mouth/Throat: Oropharynx is clear and moist. No oropharyngeal exudate.  Eyes: Conjunctivae and EOM are normal. Pupils are equal, round, and reactive to light. No scleral icterus.  Neck: Normal range of motion. Neck supple. Carotid bruit is not present.  Cardiovascular: Normal rate, regular rhythm, normal heart sounds and intact distal pulses.   No murmur heard. Pulmonary/Chest: Breath sounds normal. No respiratory distress. He has no wheezes. He has no rales.  Musculoskeletal: He exhibits edema (tr pedal edema).  Lymphadenopathy:    He has no cervical adenopathy.  Skin: Skin is warm and dry. No rash noted.       Some dry skin  external ear on left       Assessment & Plan:

## 2011-11-12 ENCOUNTER — Other Ambulatory Visit: Payer: Self-pay | Admitting: Family Medicine

## 2012-01-03 ENCOUNTER — Encounter (HOSPITAL_COMMUNITY): Payer: Self-pay

## 2012-01-03 ENCOUNTER — Emergency Department (INDEPENDENT_AMBULATORY_CARE_PROVIDER_SITE_OTHER)
Admission: EM | Admit: 2012-01-03 | Discharge: 2012-01-03 | Disposition: A | Discharge status: Home / Self Care | Payer: Medicare Other | Source: Home / Self Care

## 2012-01-03 DIAGNOSIS — M25439 Effusion, unspecified wrist: Secondary | ICD-10-CM

## 2012-01-03 DIAGNOSIS — M25431 Effusion, right wrist: Secondary | ICD-10-CM

## 2012-01-03 MED ORDER — DOXYCYCLINE HYCLATE 100 MG PO TABS: 100.0000 mg | ORAL_TABLET | Freq: Two times a day (BID) | ORAL | Status: AC

## 2012-01-03 NOTE — Discharge Instructions (Signed)
Thank you for coming in today. I am not sure why your wrist is swollen.   it could be arthritis, and infection, or gout. I will treat she for infection tonight. Please start taking the doxycycline twice a day and to followup with your primary care doctor tomorrow. Go to the emergency room if you develop a high fever or are getting worse.

## 2012-01-03 NOTE — ED Notes (Signed)
Fell , pain hand  & wrist

## 2012-01-03 NOTE — ED Provider Notes (Signed)
Christopher [name] is a 78 y.o. male who presents to Urgent Care today for right wrist pain.   Mr. Laws has noted gradual worsening right wrist pain for the last 4 days. He denies any injuries or falls abrasions or bug bites.  He denies any fevers or chills and feels well otherwise.  He thinks the swelling and pain is gradually worsening.  He denies any history of gout and feels well otherwise.    PMH reviewed. Significant for hypertension. Elevated PSA History  Substance Use Topics  . Smoking status: Former Games developer  . Smokeless tobacco: Former Neurosurgeon   Comment: smoked minimal as teen  . Alcohol Use: No   ROS as above Medications reviewed. No current facility-administered medications for this encounter.   Current Outpatient Prescriptions  Medication Sig Dispense Refill  . acetaminophen (TYLENOL) 500 MG tablet Take 1,000 mg by mouth at bedtime.        Marland Kitchen aspirin 81 MG tablet Take 81 mg by mouth daily.        . Cholecalciferol (VITAMIN D) 1000 UNITS capsule Take 1,000 Units by mouth daily.        Marland Kitchen doxycycline (VIBRA-TABS) 100 MG tablet Take 1 tablet (100 mg total) by mouth 2 (two) times daily.  20 tablet  0  . Emollient (EUCERIN CALMING DAILY MOIST) CREA Apply topically. Apply four times a day to affected area       . Flaxseed, Linseed, (FLAX SEED OIL) 1000 MG CAPS Take by mouth daily.        Marland Kitchen KLOR-CON M20 20 MEQ tablet TAKE ONE BY MOUTH DAILY  90 tablet  3  . lisinopril-hydrochlorothiazide (PRINZIDE,ZESTORETIC) 20-25 MG per tablet Take 1 tablet by mouth daily.  90 tablet  2  . loratadine (CLARITIN) 10 MG tablet Take 1 tablet (10 mg total) by mouth daily.  30 tablet  11  . NEXIUM 40 MG capsule TAKE ONE CAPSULE BY MOUTH EVERY DAY  30 capsule  6    Exam:  BP 130/88  Pulse 70  Temp(Src) 98.7 F (37.1 C) (Oral)  Resp 18  SpO2 97% Gen: Well NAD HEENT: EOMI,  MMM Lungs: CTABL Nl WOB Heart: RRR no MRG Abd: NABS, NT, ND Exts: Non edematous BL  LE, warm and well perfused.  Right wrist:  Mildly erythematous and swollen.  Erythema extending 3 cm proximal to the wrist and involving most of the hand.  Swelling is minor.  Sensation and capillary refill of the hand is normal.  Motion of the wrist is slightly limited but free. Patient experiences some tenderness to palpation but not terrible.    No results found for this or any previous visit (from the past 24 hour(s)). No results found.  Assessment and Plan: 78 y.o. male with swelling and tenderness of the right wrist. The differential in this gentleman is broad.  I feel most likely cause is arthritis.  Gout is also a possibility.  Infection is also a possibility however I don't see any obvious areas of skin breakage and a cellulitis.  However prompt treatment for the possibility of infection is warranted tonight.  Plan to treat empirically with doxycycline twice a day for 10 days.  Additionally I recommend he followup with his primary care doctor tomorrow for recheck.  If he is worsening would consider infection is most likely cause and treat more aggressively.    Additionally on the differential is metastatic disease. He has a PSA of 8 and has not had a workup for  this as per his prior wishes.    Patient expresses understanding and will take doxycycline starting tonight and followup with primary care office tomorrow.     Christopher Bong, MD 01/03/12 [phone]

## 2012-01-04 ENCOUNTER — Ambulatory Visit (INDEPENDENT_AMBULATORY_CARE_PROVIDER_SITE_OTHER): Payer: Medicare Other | Admitting: Family Medicine

## 2012-01-04 ENCOUNTER — Encounter: Payer: Self-pay | Admitting: Family Medicine

## 2012-01-04 VITALS — BP 144/88 | HR 100 | Temp 98.5°F | Wt 168.2 lb

## 2012-01-04 DIAGNOSIS — L03119 Cellulitis of unspecified part of limb: Secondary | ICD-10-CM

## 2012-01-04 DIAGNOSIS — L03113 Cellulitis of right upper limb: Secondary | ICD-10-CM

## 2012-01-04 NOTE — ED Provider Notes (Signed)
Medical screening examination/treatment/procedure(s) were performed by PGY-3 FM resident and as supervising physician I was immediately available for consultation/collaboration.   Sharin Grave, MD   Sharin Grave, MD 01/04/12 1021

## 2012-01-04 NOTE — Progress Notes (Signed)
  Subjective:    Patient ID: Christopher [name], male    DOB: [birth year], 78 y.o.   MRN: [id number]  HPI CC: wrist swelling/pain  Christopher [name] presents today as UCC f/u for wrist swelling thought to be due to arthritis but treated empirically for infx with doxy as well as tylenol.  Seen at [facility] 01/03/2012 with 4d h/o R wrist pain and swelling and erythema.  Got first pill of doxy at 7pm last night, 2nd pill this morning.  Tolerating med fine.  Thinks erythema actually receding some.  Pain mainly in hand but does radiate to elbow.  Endorses some warmth.    Denies injury/trauma to wrist prior to this episode.  No falls recently.  No insect bites.  Denies fevers/chills, nausea/vomiting.  Denies numbness or tingling or neck pain.  No h/o gout, no h/o arthritis per pt.  On HCTZ.  H/o right wrist fracture remotely (as teenager), did not seek care.  Not really tender to touch.  Lab Results  Component Value Date   CREATININE 1.4 08/18/2011   Past Medical History  Diagnosis Date  . GERD (gastroesophageal reflux disease) 08/1996  . Hyperlipemia 08/1986  . Hypertension Pre 1996  . Osteoporosis 12/2004  . Pneumonia 22 yoa  . History of Doppler echocardiogram 05/1995    MVP, MOD AI, MILD TR, MILD MR  . Elevated PSA     decided against further eval    Review of Systems Per HPI    Objective:   Physical Exam  Nursing note and vitals reviewed. Constitutional: He appears well-developed and well-nourished. No distress.  Cardiovascular:  Pulses:      Radial pulses are 2+ on the right side, and 2+ on the left side.  Musculoskeletal:       L wrist WNL R wrist - swelling present.  erythematous, mildly tender to touch mainly at dorsal wrist.  Swelling extends to MCPs and some digital swelling No point tenderness at anatomical snuff box. Decreased grip strength compared to right side Erythema demarcated with sharpie. Joints somewhat stiff but anticipate due to swelling and not due to joint inflammation       Assessment & Plan:

## 2012-01-04 NOTE — Assessment & Plan Note (Addendum)
Anticipate skin infection - nonsuppurative cellulitis - doxycycline is adequate.. Seems to be responding to doxycycline as per pt erythema receding. rtc 2 d, sooner if worsening. Low threshold to add keflex. Avoid NSAIDs given h/o elevated Cr. Story not consistent with gout.

## 2012-01-04 NOTE — Patient Instructions (Addendum)
I think you have skin infection - finish doxycycline course. Return in 2 days to recheck. Keep hand elevated as much as able. If any worsening pain or spreading redness, return tomorrow.  Cellulitis Cellulitis is an infection of the skin and the tissue beneath it. The area is typically red and tender. It is caused by germs (bacteria) (usually staph or strep) that enter the body through cuts or sores. Cellulitis most commonly occurs in the arms or lower legs.  HOME CARE INSTRUCTIONS   If you are given a prescription for medications which kill germs (antibiotics), take as directed until finished.   If the infection is on the arm or leg, keep the limb elevated as able.   Use a warm cloth several times per day to relieve pain and encourage healing.   See your caregiver for recheck of the infected site as directed if problems arise.   Only take over-the-counter or prescription medicines for pain, discomfort, or fever as directed by your caregiver.  SEEK MEDICAL CARE IF:   The area of redness (inflammation) is spreading, there are red streaks coming from the infected site, or if a part of the infection begins to turn dark in color.   The joint or bone underneath the infected skin becomes painful after the skin has healed.   The infection returns in the same or another area after it seems to have gone away.   A boil or bump swells up. This may be an abscess.   New, unexplained problems such as pain or fever develop.  SEEK IMMEDIATE MEDICAL CARE IF:   You have a fever.   You or your child feels drowsy or lethargic.   There is vomiting, diarrhea, or lasting discomfort or feeling ill (malaise) with muscle aches and pains.  MAKE SURE YOU:   Understand these instructions.   Will watch your condition.   Will get help right away if you are not doing well or get worse.  Document Released: 05/05/2005 Document Revised: 07/15/2011 Document Reviewed: 03/13/2008 [facility] Patient Information  2012 [place], [place].

## 2012-01-06 ENCOUNTER — Ambulatory Visit (INDEPENDENT_AMBULATORY_CARE_PROVIDER_SITE_OTHER): Payer: Medicare Other | Admitting: Family Medicine

## 2012-01-06 ENCOUNTER — Encounter: Payer: Self-pay | Admitting: Family Medicine

## 2012-01-06 VITALS — BP 142/78 | HR 84 | Temp 97.9°F | Wt 166.5 lb

## 2012-01-06 DIAGNOSIS — L03113 Cellulitis of right upper limb: Secondary | ICD-10-CM

## 2012-01-06 DIAGNOSIS — L02519 Cutaneous abscess of unspecified hand: Secondary | ICD-10-CM

## 2012-01-06 NOTE — Progress Notes (Signed)
  Subjective:    Patient ID: Christopher [name], male    DOB: [birth year], 78 y.o.   MRN: [id number]  HPI CC: f/u cellulitis  See prior note for details.  In short, seen here on 01/04/2012 after seen at [facility], started on doxy for right wrist swelling.  Thought nonpurulent cellulitis of R wrist.  As improving, rec continued doxy and elevation of arm.  Presents today for f/u.  Actually erythema improved, tenderness and warmth improved.   Remote h/o R wrist fracture.  No fevers/chills, no other redness anywhere.  Review of Systems Per HPI    Objective:   Physical Exam  Nursing note and vitals reviewed. Constitutional: He appears well-developed and well-nourished. No distress.  Musculoskeletal:       R wrist swelling remains Decreased tenderness to palpation. Erythema less angry than prior, has not spread past demarcation. Today moves wrist more freely. Sensation intact.  2+ rad pulses.    Skin: Skin is warm and dry. There is erythema.       Assessment & Plan:

## 2012-01-06 NOTE — Assessment & Plan Note (Signed)
Slowly improving on doxy.  Continue med.   Again encouraged elevation of wrist. Return if needed or worsening.

## 2012-02-07 ENCOUNTER — Other Ambulatory Visit: Payer: Self-pay | Admitting: Family Medicine

## 2012-02-07 DIAGNOSIS — I10 Essential (primary) hypertension: Secondary | ICD-10-CM

## 2012-02-07 DIAGNOSIS — R972 Elevated prostate specific antigen [PSA]: Secondary | ICD-10-CM

## 2012-02-07 DIAGNOSIS — E785 Hyperlipidemia, unspecified: Secondary | ICD-10-CM

## 2012-02-07 DIAGNOSIS — H903 Sensorineural hearing loss, bilateral: Secondary | ICD-10-CM

## 2012-02-07 HISTORY — DX: Sensorineural hearing loss, bilateral: H90.3

## 2012-02-08 ENCOUNTER — Other Ambulatory Visit (INDEPENDENT_AMBULATORY_CARE_PROVIDER_SITE_OTHER): Payer: Medicare Other

## 2012-02-08 DIAGNOSIS — R972 Elevated prostate specific antigen [PSA]: Secondary | ICD-10-CM

## 2012-02-08 DIAGNOSIS — E785 Hyperlipidemia, unspecified: Secondary | ICD-10-CM

## 2012-02-08 LAB — COMPREHENSIVE METABOLIC PANEL
Albumin: 3.8 g/dL (ref 3.5–5.2)
BUN: 24 mg/dL — ABNORMAL HIGH (ref 6–23)
Calcium: 9.4 mg/dL (ref 8.4–10.5)
Chloride: 104 mEq/L (ref 96–112)
GFR: 50.27 mL/min — ABNORMAL LOW (ref >60.00)
Glucose, Bld: 88 mg/dL (ref 70–99)
Potassium: 4 mEq/L (ref 3.5–5.1)

## 2012-02-08 LAB — LIPID PANEL: Total CHOL/HDL Ratio: 5

## 2012-02-08 LAB — PSA: PSA: 7.87 ng/mL — ABNORMAL HIGH (ref 0.10–4.00)

## 2012-02-15 ENCOUNTER — Encounter: Payer: Self-pay | Admitting: Family Medicine

## 2012-02-15 ENCOUNTER — Ambulatory Visit (INDEPENDENT_AMBULATORY_CARE_PROVIDER_SITE_OTHER): Payer: Medicare Other | Admitting: Family Medicine

## 2012-02-15 VITALS — BP 126/84 | HR 84 | Temp 97.9°F | Ht 71.0 in | Wt 164.8 lb

## 2012-02-15 DIAGNOSIS — H9190 Unspecified hearing loss, unspecified ear: Secondary | ICD-10-CM

## 2012-02-15 DIAGNOSIS — Z23 Encounter for immunization: Secondary | ICD-10-CM

## 2012-02-15 DIAGNOSIS — R972 Elevated prostate specific antigen [PSA]: Secondary | ICD-10-CM

## 2012-02-15 DIAGNOSIS — Z0000 Encounter for general adult medical examination without abnormal findings: Secondary | ICD-10-CM

## 2012-02-15 MED ORDER — LORATADINE 10 MG PO TABS: 10.0000 mg | ORAL_TABLET | Freq: Every day | ORAL | Status: DC

## 2012-02-15 NOTE — Assessment & Plan Note (Signed)
See above.  78yo, consider stopping prostate screening.

## 2012-02-15 NOTE — Assessment & Plan Note (Signed)
I have personally reviewed the Medicare Annual Wellness questionnaire and have noted 1. The patient's medical and social history 2. Their use of alcohol, tobacco or illicit drugs 3. Their current medications and supplements 4. The patient's functional ability including ADL's, fall risks, home safety risks and hearing or visual impairment. 5. Diet and physical activity 6. Evidence for depression or mood disorders The patients weight, height, BMI have been recorded in the chart.  Hearing and vision has been addressed. I have made referrals, counseling and provided education to the patient based review of the above and I have provided the pt with a written personalized care plan for preventive services. See scanned questionairre. Advanced directives discussed: daughter is HCPOA.  Living will with daughter.  Reviewed preventative protocols and updated unless pt declined. Td today. Will age out today of prostate screening.  discussed elevated level, but last year's exam consistent with BPH, smooth, enlarged.  No back pain,  Stable nocturia x2. Monitor weakness if not improved, return for further blood work (CBC, B12, TSH, etc). Refer for formal audiological eval.

## 2012-02-15 NOTE — Patient Instructions (Addendum)
Pass by Christopher [name]'s office for referral for formal audiological evaluation. Tetanus shot today. Good to see you today call us with quesitons. Continue walking.  Back off iced tea.

## 2012-02-15 NOTE — Addendum Note (Signed)
Addended by: Josph Macho A on: 02/15/2012 09:42 AM   Modules accepted: Orders

## 2012-02-15 NOTE — Progress Notes (Signed)
Subjective:    Patient ID: Christopher [name], male    DOB: 18-Aug-1923, 78 y.o.   MRN: [id number]  HPI CC: medicare wellness visit, subsequent  Noticing weakness over last few months.  No pain or malaise.  Occasional cramp in left leg worse at night.  No night sweats.  No fevers/chills.  No significant weight loss over last several years. Wt Readings from Last 3 Encounters:  02/15/12 164 lb 12 oz (74.73 kg)  01/06/12 166 lb 8 oz (75.524 kg)  01/04/12 168 lb 4 oz (76.318 kg)   Preventative: Last year had CPE with Dr. Hetty Ely. Colonoscopy 2008, diverticulosis and int hemorrhoids.  No blood in stool. Prostate screening - elevated PSA in past, last year's DRE reassuring, enlarged prostate but smooth.  Age out this year. Tetanus - 2003.   Pneumonia shot 1998 zostavax 2010 Flu shot yearly. Hearing problem - has noticed problems hearing for last bit.  Esp with hearing in crowded room or at church.  Discussed audiology eval - would like referal but likely will not want hearing aides. Advanced directives: has living will at home - daughter has it.  Filled out by lawyer.  Daughter is HCPOA.  Denies falls in last year. Denies anhedonia, depression, sadness.  Caffeine: 2-3 cups/day, 1 gallon iced tea Lives alone.  Widower.  2 dogs at home. Lots of driving. Activity: walking - 28min-1hr daily Diet: some water, vegetables daily, occasional fruits  Medications and allergies reviewed and updated in chart.  Past histories reviewed and updated if relevant as below. Patient Active Problem List  Diagnosis  . HYPERLIPIDEMIA  . HYPERTENSION  . HEMORRHOIDS, INTERNAL  . GERD  . HIATAL HERNIA  . DIVERTICULOSIS, COLON  . ROSACEA  . DYSHIDROSIS  . OSTEOPOROSIS  . PROSTATE SPECIFIC ANTIGEN, ELEVATED  . Dry skin  . Cellulitis of hand, right  . Medicare annual wellness visit, subsequent   Past Medical History  Diagnosis Date  . GERD (gastroesophageal reflux disease) 08/1996  . Hyperlipemia  08/1986  . Hypertension Pre 1996  . Osteoporosis 12/2004  . Pneumonia 22 yoa  . History of Doppler echocardiogram 05/1995    MVP, MOD AI, MILD TR, MILD MR  . Elevated PSA     decided against further eval  . Diverticulosis    Past Surgical History  Procedure Date  . Cholecystectomy 05/1995  . Exercise treadmill 09/12/2006    NML  . Inguinal hernia repair 08/14/2010    Dr. Daphine Deutscher  . Esophagogastroduodenoscopy 08/18/2004    H. H. Dr. Russella Dar  . Colonoscopy 05/01/2007    divericulosis, int hemorrhoids - Dr. Russella Dar   History  Substance Use Topics  . Smoking status: Former Games developer  . Smokeless tobacco: Former Neurosurgeon   Comment: smoked minimal as teen  . Alcohol Use: No   Family History  Problem Relation Age of Onset  . Heart disease Mother     MI  . Hypertension Mother   . Stroke Mother     ?  . Stroke Father   . Diabetes Sister   . Hypertension Sister   . Hypertension Sister   . Hypertension Sister   . Stroke Sister     recurrent mini strokes  . Cancer Neg Hx    No Known Allergies Current Outpatient Prescriptions on File Prior to Visit  Medication Sig Dispense Refill  . acetaminophen (TYLENOL) 500 MG tablet Take 1,000 mg by mouth at bedtime.        Marland Kitchen aspirin 81 MG tablet Take 81  mg by mouth daily.        . Cholecalciferol (VITAMIN D) 1000 UNITS capsule Take 1,000 Units by mouth daily.        . Emollient (EUCERIN CALMING DAILY MOIST) CREA Apply topically. Apply four times a day to affected area       . Flaxseed, Linseed, (FLAX SEED OIL) 1000 MG CAPS Take by mouth daily.        Marland Kitchen KLOR-CON M20 20 MEQ tablet TAKE ONE BY MOUTH DAILY  90 tablet  3  . lisinopril-hydrochlorothiazide (PRINZIDE,ZESTORETIC) 20-25 MG per tablet Take 1 tablet by mouth daily.  90 tablet  2  . NEXIUM 40 MG capsule TAKE ONE CAPSULE BY MOUTH EVERY DAY  30 capsule  6  . DISCONTD: loratadine (CLARITIN) 10 MG tablet Take 1 tablet (10 mg total) by mouth daily.  30 tablet  11     Review of Systems    Constitutional: Negative for fever, chills, activity change, appetite change, fatigue and unexpected weight change.  HENT: Negative for hearing loss and neck pain.   Eyes: Negative for visual disturbance.  Respiratory: Negative for cough, chest tightness, shortness of breath and wheezing.   Cardiovascular: Negative for chest pain, palpitations and leg swelling.  Gastrointestinal: Negative for nausea, vomiting, abdominal pain, diarrhea, constipation, blood in stool and abdominal distention.  Genitourinary: Negative for hematuria and difficulty urinating.  Musculoskeletal: Negative for myalgias and arthralgias.  Skin: Negative for rash.  Neurological: Negative for dizziness, seizures, syncope and headaches.  Hematological: Does not bruise/bleed easily.  Psychiatric/Behavioral: Negative for dysphoric mood. The patient is not nervous/anxious.        Objective:   Physical Exam  Nursing note and vitals reviewed. Constitutional: He is oriented to person, place, and time. He appears well-developed and well-nourished. No distress.  HENT:  Head: Normocephalic and atraumatic.  Right Ear: Tympanic membrane, external ear and ear canal normal. Decreased hearing is noted.  Left Ear: Tympanic membrane, external ear and ear canal normal. Decreased hearing is noted.  Nose: Nose normal.  Mouth/Throat: Oropharynx is clear and moist. No oropharyngeal exudate.       No cerumen  Eyes: Conjunctivae and EOM are normal. Pupils are equal, round, and reactive to light. No scleral icterus.  Neck: Normal range of motion. Neck supple. Carotid bruit is not present.  Cardiovascular: Normal rate, regular rhythm, normal heart sounds and intact distal pulses.   No murmur heard. Pulses:      Radial pulses are 2+ on the right side, and 2+ on the left side.  Pulmonary/Chest: Effort normal and breath sounds normal. No respiratory distress. He has no wheezes. He has no rales.  Abdominal: Soft. Bowel sounds are normal. He  exhibits no distension and no mass. There is no tenderness. There is no rebound and no guarding.  Genitourinary:       deferred  Musculoskeletal: Normal range of motion. He exhibits no edema.  Lymphadenopathy:    He has no cervical adenopathy.  Neurological: He is alert and oriented to person, place, and time.       CN grossly intact, station and gait intact  Skin: Skin is warm and dry. No rash noted.  Psychiatric: He has a normal mood and affect. His behavior is normal. Judgment and thought content normal.       Assessment & Plan:

## 2012-02-27 ENCOUNTER — Encounter: Payer: Self-pay | Admitting: Family Medicine

## 2012-03-06 ENCOUNTER — Encounter: Payer: Self-pay | Admitting: Family Medicine

## 2012-03-06 ENCOUNTER — Other Ambulatory Visit: Payer: Self-pay | Admitting: Family Medicine

## 2012-04-02 ENCOUNTER — Inpatient Hospital Stay (HOSPITAL_COMMUNITY)
Admission: EM | Admit: 2012-04-02 | Discharge: 2012-04-05 | DRG: 552 | Disposition: A | Discharge status: Home / Self Care | Payer: Medicare Other | Attending: Internal Medicine | Admitting: Internal Medicine

## 2012-04-02 ENCOUNTER — Inpatient Hospital Stay (HOSPITAL_COMMUNITY): Payer: Medicare Other

## 2012-04-02 ENCOUNTER — Encounter (HOSPITAL_COMMUNITY): Payer: Self-pay | Admitting: *Deleted

## 2012-04-02 ENCOUNTER — Emergency Department (HOSPITAL_COMMUNITY): Payer: Medicare Other

## 2012-04-02 DIAGNOSIS — L301 Dyshidrosis [pompholyx]: Secondary | ICD-10-CM

## 2012-04-02 DIAGNOSIS — E872 Acidosis, unspecified: Secondary | ICD-10-CM | POA: Diagnosis present

## 2012-04-02 DIAGNOSIS — K648 Other hemorrhoids: Secondary | ICD-10-CM

## 2012-04-02 DIAGNOSIS — Z87891 Personal history of nicotine dependence: Secondary | ICD-10-CM

## 2012-04-02 DIAGNOSIS — Z0000 Encounter for general adult medical examination without abnormal findings: Secondary | ICD-10-CM

## 2012-04-02 DIAGNOSIS — R972 Elevated prostate specific antigen [PSA]: Secondary | ICD-10-CM

## 2012-04-02 DIAGNOSIS — M545 Low back pain, unspecified: Principal | ICD-10-CM

## 2012-04-02 DIAGNOSIS — L03113 Cellulitis of right upper limb: Secondary | ICD-10-CM

## 2012-04-02 DIAGNOSIS — L853 Xerosis cutis: Secondary | ICD-10-CM

## 2012-04-02 DIAGNOSIS — K219 Gastro-esophageal reflux disease without esophagitis: Secondary | ICD-10-CM

## 2012-04-02 DIAGNOSIS — K5730 Diverticulosis of large intestine without perforation or abscess without bleeding: Secondary | ICD-10-CM

## 2012-04-02 DIAGNOSIS — R109 Unspecified abdominal pain: Secondary | ICD-10-CM

## 2012-04-02 DIAGNOSIS — IMO0002 Reserved for concepts with insufficient information to code with codable children: Secondary | ICD-10-CM | POA: Diagnosis present

## 2012-04-02 DIAGNOSIS — M810 Age-related osteoporosis without current pathological fracture: Secondary | ICD-10-CM

## 2012-04-02 DIAGNOSIS — K449 Diaphragmatic hernia without obstruction or gangrene: Secondary | ICD-10-CM

## 2012-04-02 DIAGNOSIS — M549 Dorsalgia, unspecified: Secondary | ICD-10-CM

## 2012-04-02 DIAGNOSIS — D649 Anemia, unspecified: Secondary | ICD-10-CM

## 2012-04-02 DIAGNOSIS — I10 Essential (primary) hypertension: Secondary | ICD-10-CM

## 2012-04-02 DIAGNOSIS — L719 Rosacea, unspecified: Secondary | ICD-10-CM

## 2012-04-02 DIAGNOSIS — E785 Hyperlipidemia, unspecified: Secondary | ICD-10-CM

## 2012-04-02 LAB — CBC WITH DIFFERENTIAL/PLATELET
Basophils Absolute: 0 10*3/uL (ref 0.0–0.1)
Basophils Relative: 0 % (ref 0–1)
Eosinophils Absolute: 0.1 10*3/uL (ref 0.0–0.7)
Eosinophils Relative: 1 % (ref 0–5)
HCT: 47.9 % (ref 39.0–52.0)
Lymphocytes Relative: 16 % (ref 12–46)
MCH: 32.6 pg (ref 26.0–34.0)
MCHC: 34.7 g/dL (ref 30.0–36.0)
MCV: 94.1 fL (ref 78.0–100.0)
Monocytes Absolute: 0.8 10*3/uL (ref 0.1–1.0)
RDW: 13.1 % (ref 11.5–15.5)

## 2012-04-02 LAB — URINALYSIS, ROUTINE W REFLEX MICROSCOPIC
Ketones, ur: NEGATIVE mg/dL
Leukocytes, UA: NEGATIVE
Nitrite: NEGATIVE
Protein, ur: NEGATIVE mg/dL

## 2012-04-02 LAB — COMPREHENSIVE METABOLIC PANEL
ALT: 9 U/L (ref 0–53)
Alkaline Phosphatase: 60 U/L (ref 39–117)
CO2: 25 mEq/L (ref 19–32)
Calcium: 10.6 mg/dL — ABNORMAL HIGH (ref 8.4–10.5)
GFR calc non Af Amer: 59 mL/min — ABNORMAL LOW (ref >90)
Glucose, Bld: 107 mg/dL — ABNORMAL HIGH (ref 70–99)
Sodium: 136 mEq/L (ref 135–145)

## 2012-04-02 LAB — PROCALCITONIN: Procalcitonin: <0.1 ng/mL

## 2012-04-02 LAB — ABO/RH: ABO/RH(D): O POS

## 2012-04-02 LAB — TROPONIN I: Troponin I: <0.3 ng/mL (ref <0.30)

## 2012-04-02 LAB — LIPASE, BLOOD: Lipase: 25 U/L (ref 11–59)

## 2012-04-02 LAB — LACTIC ACID, PLASMA: Lactic Acid, Venous: 4.1 mmol/L — ABNORMAL HIGH (ref 0.5–2.2)

## 2012-04-02 LAB — OCCULT BLOOD, POC DEVICE: Fecal Occult Bld: NEGATIVE

## 2012-04-02 MED ORDER — MORPHINE SULFATE 2 MG/ML IJ SOLN
2.0000 mg | Freq: Once | INTRAMUSCULAR | Status: AC
  Administered 2012-04-02: 2 mg via INTRAVENOUS
  Filled 2012-04-02: qty 1

## 2012-04-02 MED ORDER — ASPIRIN 81 MG PO TABS: 81.0000 mg | ORAL_TABLET | Freq: Every day | ORAL | Status: DC

## 2012-04-02 MED ORDER — HYDRALAZINE HCL 20 MG/ML IJ SOLN
10.0000 mg | INTRAMUSCULAR | Status: DC | PRN
  Filled 2012-04-02: qty 0.5

## 2012-04-02 MED ORDER — IOHEXOL 300 MG/ML  SOLN
20.0000 mL | INTRAMUSCULAR | Status: DC
  Administered 2012-04-02: 20 mL via ORAL

## 2012-04-02 MED ORDER — ACETAMINOPHEN 650 MG RE SUPP: 650.0000 mg | Freq: Four times a day (QID) | RECTAL | Status: DC | PRN

## 2012-04-02 MED ORDER — ACETAMINOPHEN 325 MG PO TABS
650.0000 mg | ORAL_TABLET | Freq: Four times a day (QID) | ORAL | Status: DC | PRN
  Administered 2012-04-03: 650 mg via ORAL
  Filled 2012-04-02: qty 2

## 2012-04-02 MED ORDER — PANTOPRAZOLE SODIUM 40 MG PO TBEC
40.0000 mg | DELAYED_RELEASE_TABLET | Freq: Every day | ORAL | Status: DC
  Administered 2012-04-02 – 2012-04-05 (×4): 40 mg via ORAL
  Filled 2012-04-02 (×4): qty 1

## 2012-04-02 MED ORDER — POTASSIUM CHLORIDE IN NACL 20-0.9 MEQ/L-% IV SOLN
INTRAVENOUS | Status: DC
  Administered 2012-04-02 – 2012-04-05 (×6): via INTRAVENOUS
  Filled 2012-04-02 (×8): qty 1000

## 2012-04-02 MED ORDER — ONDANSETRON HCL 4 MG/2ML IJ SOLN: 4.0000 mg | Freq: Three times a day (TID) | INTRAMUSCULAR | Status: DC | PRN

## 2012-04-02 MED ORDER — SODIUM CHLORIDE 0.9 % IV SOLN: INTRAVENOUS | Status: DC

## 2012-04-02 MED ORDER — SODIUM CHLORIDE 0.9 % IV BOLUS (SEPSIS)
500.0000 mL | Freq: Once | INTRAVENOUS | Status: AC
  Administered 2012-04-02: 500 mL via INTRAVENOUS

## 2012-04-02 MED ORDER — ONDANSETRON HCL 4 MG PO TABS: 4.0000 mg | ORAL_TABLET | Freq: Four times a day (QID) | ORAL | Status: DC | PRN

## 2012-04-02 MED ORDER — SODIUM CHLORIDE 0.9 % IJ SOLN
3.0000 mL | Freq: Two times a day (BID) | INTRAMUSCULAR | Status: DC
  Administered 2012-04-02 – 2012-04-04 (×3): 3 mL via INTRAVENOUS

## 2012-04-02 MED ORDER — IOHEXOL 300 MG/ML  SOLN
100.0000 mL | Freq: Once | INTRAMUSCULAR | Status: AC | PRN
  Administered 2012-04-02: 100 mL via INTRAVENOUS

## 2012-04-02 MED ORDER — MORPHINE SULFATE 2 MG/ML IJ SOLN
1.0000 mg | INTRAMUSCULAR | Status: DC | PRN
  Administered 2012-04-03 – 2012-04-04 (×4): 1 mg via INTRAVENOUS
  Filled 2012-04-02 (×4): qty 1

## 2012-04-02 MED ORDER — ONDANSETRON HCL 4 MG/2ML IJ SOLN
4.0000 mg | Freq: Four times a day (QID) | INTRAMUSCULAR | Status: DC | PRN
  Administered 2012-04-03: 4 mg via INTRAVENOUS
  Filled 2012-04-02: qty 2

## 2012-04-02 MED ORDER — ASPIRIN EC 81 MG PO TBEC
81.0000 mg | DELAYED_RELEASE_TABLET | Freq: Every day | ORAL | Status: DC
  Administered 2012-04-02 – 2012-04-05 (×4): 81 mg via ORAL
  Filled 2012-04-02 (×4): qty 1

## 2012-04-02 NOTE — ED Provider Notes (Signed)
History     CSN: [id number]  Arrival date & time 04/02/12  1415   First MD Initiated Contact with Patient 04/02/12 1458      Chief Complaint  Patient presents with  . Back Pain  . Abdominal Pain    (Consider location/radiation/quality/duration/timing/severity/associated sxs/prior treatment) HPI Comments: Lower back pain that has been constant since yesterday.  Feels similar to when had a fracture several years ago. Pain is in low back and does not radiate. Denies any fall or injury.  Was pulling weeds on Thursday and Friday.  No chest pain or SOB.  Today developed constant lower abdominal pain that radiates to back.  No nausea, vomiting or fever.  No history of AAA.  No weakness, numbness, tingling, fever.  No bowel or bladder incontinence.  No history of cancer or IVDA.  The history is provided by the patient and a relative.    Past Medical History  Diagnosis Date  . GERD (gastroesophageal reflux disease) 08/1996  . Hyperlipemia 08/1986  . Hypertension Pre 1996  . Osteoporosis 12/2004  . Pneumonia 22 yoa  . History of Doppler echocardiogram 05/1995    MVP, MOD AI, MILD TR, MILD MR  . Elevated PSA     decided against further eval  . Diverticulosis   . Bilateral sensorineural hearing loss 02/2012    some conductive left side    Past Surgical History  Procedure Date  . Cholecystectomy 05/1995  . Exercise treadmill 09/12/2006    NML  . Inguinal hernia repair 08/14/2010    Dr. Daphine Deutscher  . Esophagogastroduodenoscopy 08/18/2004    H. H. Dr. Russella Dar  . Colonoscopy 05/01/2007    divericulosis, int hemorrhoids - Dr. Russella Dar    Family History  Problem Relation Age of Onset  . Heart disease Mother     MI  . Hypertension Mother   . Stroke Mother     ?  . Stroke Father   . Diabetes Sister   . Hypertension Sister   . Hypertension Sister   . Hypertension Sister   . Stroke Sister     recurrent mini strokes  . Cancer Neg Hx     History  Substance Use Topics  . Smoking  status: Former Games developer  . Smokeless tobacco: Former Neurosurgeon   Comment: smoked minimal as teen  . Alcohol Use: No      Review of Systems  Constitutional: Negative for fever, activity change and appetite change.  HENT: Negative for congestion and rhinorrhea.   Respiratory: Negative for cough, chest tightness and shortness of breath.   Cardiovascular: Negative for chest pain.  Gastrointestinal: Positive for abdominal pain. Negative for nausea and vomiting.  Genitourinary: Negative for dysuria and hematuria.  Musculoskeletal: Positive for back pain.  Skin: Negative for rash.  Neurological: Negative for weakness and headaches.    Allergies  Review of patient's allergies indicates no known allergies.  Home Medications   No current outpatient prescriptions on file.  BP 132/79  Pulse 68  Temp 99.1 F (37.3 C) (Oral)  Resp 16  Ht 5\' 11"  (1.803 m)  Wt 169 lb 8 oz (76.885 kg)  BMI 23.64 kg/m2  SpO2 90%  Physical Exam  Constitutional: He is oriented to person, place, and time. He appears well-developed and well-nourished. No distress.  HENT:  Head: Normocephalic and atraumatic.  Mouth/Throat: Oropharynx is clear and moist. No oropharyngeal exudate.  Eyes: Conjunctivae and EOM are normal. Pupils are equal, round, and reactive to light.  Neck: Normal range of motion.  Neck supple.  Cardiovascular: Normal rate, regular rhythm and normal heart sounds.   No murmur heard.      +_2 femoral, DP, PT pulses  Pulmonary/Chest: Effort normal and breath sounds normal. No respiratory distress.  Abdominal: Soft. There is tenderness. There is no rebound and no guarding.       Mild TTP in lower abdomen.  No pulsatile masses.  Musculoskeletal: Normal range of motion. He exhibits no edema and no tenderness.       TTP lumbar spine in midline and paraspinal muscles, no stepoff or deformity.   Neurological: He is alert and oriented to person, place, and time. No cranial nerve deficit.       5/5  bilateral lower extremities, ankle plantar and dorsiflexion intact, great toe dorsiflexion intact. +2 DP and PT pulses. +2 patellar reflexes bilaterally.  Skin: Skin is warm.    ED Course  Procedures (including critical care time)  Labs Reviewed  CBC WITH DIFFERENTIAL - Abnormal; Notable for the following:    Neutro Abs 7.9 (*)     All other components within normal limits  COMPREHENSIVE METABOLIC PANEL - Abnormal; Notable for the following:    Potassium 3.3 (*)     Chloride 95 (*)     Glucose, Bld 107 (*)     Calcium 10.6 (*)     Total Protein 9.4 (*)     GFR calc non Af Amer 59 (*)     GFR calc Af Amer 68 (*)     All other components within normal limits  LACTIC ACID, PLASMA - Abnormal; Notable for the following:    Lactic Acid, Venous 2.4 (*)     All other components within normal limits  LACTIC ACID, PLASMA - Abnormal; Notable for the following:    Lactic Acid, Venous 4.1 (*)     All other components within normal limits  COMPREHENSIVE METABOLIC PANEL - Abnormal; Notable for the following:    Albumin 3.0 (*)     GFR calc non Af Amer 61 (*)     GFR calc Af Amer 71 (*)     All other components within normal limits  CBC WITH DIFFERENTIAL - Abnormal; Notable for the following:    RBC 3.93 (*)     Hemoglobin 12.6 (*)  DELTA CHECK NOTED   HCT 36.8 (*)     Platelets 135 (*)     All other components within normal limits  URINALYSIS, ROUTINE W REFLEX MICROSCOPIC  TYPE AND SCREEN  LIPASE, BLOOD  TROPONIN I  OCCULT BLOOD, POC DEVICE  ABO/RH  LACTIC ACID, PLASMA  CARDIAC PANEL(CRET KIN+CKTOT+MB+TROPI)  PROCALCITONIN  VITAMIN D 25 HYDROXY  PARATHYROID HORMONE, INTACT (NO CA)  PROTEIN ELECTROPHORESIS, SERUM  IMMUNOFIXATION ELECTROPHORESIS, URINE (WITH TOT PROT)   Ct Abdomen Pelvis W Contrast  04/02/2012  *RADIOLOGY REPORT*  Clinical Data: Low back and lower abdominal pain.  CT ABDOMEN AND PELVIS WITH CONTRAST  Technique:  Multidetector CT imaging of the abdomen and pelvis  was performed following the standard protocol during bolus administration of intravenous contrast.  Contrast: OMNIPAQUE IOHEXOL 300 MG/ML  SOLN  Comparison: Plain films lumbar spine 10/11/2004.  Findings: Large hiatal hernia is noted.  There is some dependent atelectasis in the lung bases.  No pleural or pericardial effusion. There is cardiomegaly.  Coronary and aortic atherosclerotic vascular disease is identified.  The patient is status post cholecystectomy.  There is a low attenuating lesion near the dome of the liver measuring 1.3 cm in  diameter most consistent with a cyst. A tiny calcified lesion is seen off the serosal surface of the posterior right hepatic lobe which may be due to old granulomatous disease or some other insult. There is mild biliary ductal prominence likely related to cholecystectomy and the patient's age.  No ductal stone is seen. Small bilateral renal cysts are noted.  The kidneys are otherwise unremarkable. The spleen is unremarkable.  Very mild prominence of the distal pancreatic duct is noted without obstructing mass is noted.  The stomach and small bowel are unremarkable.  There is some sigmoid diverticulosis without diverticulitis.  The appendix appears normal.  No lymphadenopathy or fluid is identified. Scattered atherosclerotic vascular disease is seen in the descending abdominal aorta without aneurysm.  No dissection is present.  The prostate gland is enlarged.  Urinary bladder seminal vesicles are normal appearance.  Small fat containing inguinal hernias are seen bilaterally.  Bones demonstrate compression fracture deformities of T7, L1, L2, L3, L4 and L5.  T7 is not included on the prior plain films but the other compression fracture deformities are remote.  Also seen are old right superior and inferior pubic rami fractures.  IMPRESSION:  1.  No acute finding. 2.  Hiatal hernia. 3.  Enlarged prostate gland. 4.  Mild sigmoid diverticulosis without diverticulitis.   Original  Report Authenticated By: Bernadene Bell. Maricela Curet, M.D.    US Aorta  04/02/2012  *RADIOLOGY REPORT*  Clinical Data:  Back pain and abdominal pain.  ULTRASOUND OF ABDOMINAL AORTA  Technique:  Ultrasound examination of the abdominal aorta was performed to evaluate for abdominal aortic aneurysm.  Comparison: CT scan, same date.  Abdominal Aorta:  Mild tortuosity and ectasia of the aorta but no focal aneurysm.  Moderate atherosclerotic calcifications.        Maximum AP diameter:  2.2 cm.       Maximum TRV diameter:  2.4 cm.  IMPRESSION: No abdominal aortic aneurysm identified.   Original Report Authenticated By: P. Loralie Champagne, M.D.    Dg Chest Port 1 View  04/02/2012  *RADIOLOGY REPORT*  Clinical Data: Possible lung mass.  PORTABLE CHEST - 1 VIEW  Comparison: PA and lateral chest 08/12/2010.  Findings: Lung volumes are much lower than on the comparison study. No mass is identified.  The patient has a large hiatal hernia. Heart size is upper normal.  No pneumothorax or pleural fluid.  IMPRESSION:  1.  No acute finding in a low-volume chest. 2.  Large hiatal hernia.   Original Report Authenticated By: Bernadene Bell. D'ALESSIO, M.D.      1. Lactic acidosis   2. Back pain   3. Abdominal pain   4. Hypercalcemia   5. Low back pain       MDM  Low back pain after pulling weeds 2 days ago with lower abdominal pain this morning. No weakness, numbness or tingling. No bowel or bladder incontinence, equal motor, sensory and distal pulses.  Consider AAA, mesenteric ischemia, constipation, radiculopathy, compression fracture. No evidence of cauda equina syndrome or cord compression. No neuro or pulse deficit.  No AAA on [place].  Elevated lactic acid level, however abdominal pain has improved. CT scan to evaluate for possible mesenteric ischemia. Old lumbar compression fractures could be causing pain.  No signs of ischemia on CT per radiologist, however lactate has increased to 4 despite fluid hydration.  Pain has  resolved.  Uncertain etiology of lactic acidosis.  Will need admission for further workup.    Date: 04/02/2012  Rate:  88  Rhythm: normal sinus rhythm  QRS Axis: normal  Intervals: PR prolonged  ST/T Wave abnormalities: nonspecific ST/T changes  Conduction Disutrbances:first-degree A-V block   Narrative Interpretation:   Old EKG Reviewed: unchanged    Glynn Octave, MD 04/03/12 1112

## 2012-04-02 NOTE — ED Notes (Signed)
Admitting MD at BS.  

## 2012-04-02 NOTE — ED Notes (Signed)
IVF bolus complete, infusing 100/hr on pump, no changes, alert, NAD, calm, interactive, "feels better", lactic acid resulted, pending admission.

## 2012-04-02 NOTE — ED Notes (Signed)
MD at bedside. 

## 2012-04-02 NOTE — ED Notes (Signed)
Pt walked around pod D. Pt stated that his back hurt as well as his stomach while walking.

## 2012-04-02 NOTE — ED Notes (Signed)
No changes, family x2 at [facility], denies sx or complaints at this time.up to floor.

## 2012-04-02 NOTE — ED Notes (Signed)
To ED for eval of lower back pain after pulling weeds last Thursday and Friday. States pain started yesterday. Denies injury. Also states he noticed his lower abd hurting this am while in the shower. Pain is constant. Pt states his stomach hurts even with breathing. Denies pain when urinating. Denies diarrhea. No pulsating masses noted at triage. Pain is straight through abd site to back.

## 2012-04-02 NOTE — H&P (Signed)
Christopher [name] is an 78 y.o. male.   Patient was seen and examined on April 02, 2012 at 9 PM. PCP - Dr.Guitterez at [facility]. Chief Complaint: Abdominal pain. HPI: 78 year old male with history of hypertension and elevated PSA has been experiencing some low back pain since Friday, 2 days ago after he worked in the yard in his house. The pain progressively worsened. Today after church patient also started developing pain across his lower quadrants of his abdomen. There was no associated nausea vomiting or diarrhea. Denies any fever chills. Since the pain was excruciating patient came to the ER. CT abdomen pelvis done in the ER did not show any acute except for old fractures in the lumbar spine. Patient's lactic acid was elevated on presentation which further worsened. At this time patient has been admitted for further management of his abdominal pain low back pain and lactic acidosis. Patient abdominal pain has been controlled after 2 doses of morphine. Denies any chest pain or shortness of breath.  Past Medical History  Diagnosis Date  . GERD (gastroesophageal reflux disease) 08/1996  . Hyperlipemia 08/1986  . Hypertension Pre 1996  . Osteoporosis 12/2004  . Pneumonia 22 yoa  . History of Doppler echocardiogram 05/1995    MVP, MOD AI, MILD TR, MILD MR  . Elevated PSA     decided against further eval  . Diverticulosis   . Bilateral sensorineural hearing loss 02/2012    some conductive left side    Past Surgical History  Procedure Date  . Cholecystectomy 05/1995  . Exercise treadmill 09/12/2006    NML  . Inguinal hernia repair 08/14/2010    Dr. Daphine Deutscher  . Esophagogastroduodenoscopy 08/18/2004    [facility]. Dr. Russella Dar  . Colonoscopy 05/01/2007    divericulosis, int hemorrhoids - Dr. Russella Dar    Family History  Problem Relation Age of Onset  . Heart disease Mother     MI  . Hypertension Mother   . Stroke Mother     ?  . Stroke Father   . Diabetes Sister   . Hypertension Sister   .  Hypertension Sister   . Hypertension Sister   . Stroke Sister     recurrent mini strokes  . Cancer Neg Hx    Social History:  reports that he has quit smoking. He has quit using smokeless tobacco. He reports that he does not drink alcohol or use illicit drugs.  Allergies: No Known Allergies   (Not in a hospital admission)  Results for orders placed during the hospital encounter of 04/02/12 (from the past 48 hour(s))  URINALYSIS, ROUTINE W REFLEX MICROSCOPIC     Status: Normal   Collection Time   04/02/12  2:53 PM      Component Value Range Comment   Color, Urine YELLOW  YELLOW    APPearance CLEAR  CLEAR    Specific Gravity, Urine 1.019  1.005 - 1.030    pH 5.0  5.0 - 8.0    Glucose, UA NEGATIVE  NEGATIVE mg/dL    Hgb urine dipstick NEGATIVE  NEGATIVE    Bilirubin Urine NEGATIVE  NEGATIVE    Ketones, ur NEGATIVE  NEGATIVE mg/dL    Protein, ur NEGATIVE  NEGATIVE mg/dL    Urobilinogen, UA 0.2  0.0 - 1.0 mg/dL    Nitrite NEGATIVE  NEGATIVE    Leukocytes, UA NEGATIVE  NEGATIVE MICROSCOPIC NOT DONE ON URINES WITH NEGATIVE PROTEIN, BLOOD, LEUKOCYTES, NITRITE, OR GLUCOSE <1000 mg/dL.  CBC WITH DIFFERENTIAL  Status: Abnormal   Collection Time   04/02/12  3:20 PM      Component Value Range Comment   WBC 10.5  4.0 - 10.5 K/uL    RBC 5.09  4.22 - 5.81 MIL/uL    Hemoglobin 16.6  13.0 - 17.0 g/dL    HCT 78.2  95.6 - 21.3 %    MCV 94.1  78.0 - 100.0 fL    MCH 32.6  26.0 - 34.0 pg    MCHC 34.7  30.0 - 36.0 g/dL    RDW 08.6  57.8 - 46.9 %    Platelets 164  150 - 400 K/uL    Neutrophils Relative 75  43 - 77 %    Neutro Abs 7.9 (*) 1.7 - 7.7 K/uL    Lymphocytes Relative 16  12 - 46 %    Lymphs Abs 1.7  0.7 - 4.0 K/uL    Monocytes Relative 7  3 - 12 %    Monocytes Absolute 0.8  0.1 - 1.0 K/uL    Eosinophils Relative 1  0 - 5 %    Eosinophils Absolute 0.1  0.0 - 0.7 K/uL    Basophils Relative 0  0 - 1 %    Basophils Absolute 0.0  0.0 - 0.1 K/uL   COMPREHENSIVE METABOLIC PANEL      Status: Abnormal   Collection Time   04/02/12  3:20 PM      Component Value Range Comment   Sodium 136  135 - 145 mEq/L    Potassium 3.3 (*) 3.5 - 5.1 mEq/L    Chloride 95 (*) 96 - 112 mEq/L    CO2 25  19 - 32 mEq/L    Glucose, Bld 107 (*) 70 - 99 mg/dL    BUN 16  6 - 23 mg/dL    Creatinine, Ser 6.29  0.50 - 1.35 mg/dL    Calcium 52.8 (*) 8.4 - 10.5 mg/dL    Total Protein 9.4 (*) 6.0 - 8.3 g/dL    Albumin 4.5  3.5 - 5.2 g/dL    AST 16  0 - 37 U/L    ALT 9  0 - 53 U/L    Alkaline Phosphatase 60  39 - 117 U/L    Total Bilirubin 0.9  0.3 - 1.2 mg/dL    GFR calc non Af Amer 59 (*) >90 mL/min    GFR calc Af Amer 68 (*) >90 mL/min   LIPASE, BLOOD     Status: Normal   Collection Time   04/02/12  3:20 PM      Component Value Range Comment   Lipase 25  11 - 59 U/L   TROPONIN I     Status: Normal   Collection Time   04/02/12  3:20 PM      Component Value Range Comment   Troponin I <0.30  <0.30 ng/mL   LACTIC ACID, PLASMA     Status: Abnormal   Collection Time   04/02/12  3:20 PM      Component Value Range Comment   Lactic Acid, Venous 2.4 (*) 0.5 - 2.2 mmol/L   TYPE AND SCREEN     Status: Normal   Collection Time   04/02/12  3:45 PM      Component Value Range Comment   ABO/RH(D) O POS      Antibody Screen NEG      Sample Expiration 04/05/2012     ABO/RH     Status: Normal   Collection Time  04/02/12  3:45 PM      Component Value Range Comment   ABO/RH(D) O POS     OCCULT BLOOD, POC DEVICE     Status: Normal   Collection Time   04/02/12  4:11 PM      Component Value Range Comment   Fecal Occult Bld NEGATIVE     LACTIC ACID, PLASMA     Status: Abnormal   Collection Time   04/02/12  7:04 PM      Component Value Range Comment   Lactic Acid, Venous 4.1 (*) 0.5 - 2.2 mmol/L    Ct Abdomen Pelvis W Contrast  04/02/2012  *RADIOLOGY REPORT*  Clinical Data: Low back and lower abdominal pain.  CT ABDOMEN AND PELVIS WITH CONTRAST  Technique:  Multidetector CT imaging of the abdomen and  pelvis was performed following the standard protocol during bolus administration of intravenous contrast.  Contrast: OMNIPAQUE IOHEXOL 300 MG/ML  SOLN  Comparison: Plain films lumbar spine 10/11/2004.  Findings: Large hiatal hernia is noted.  There is some dependent atelectasis in the lung bases.  No pleural or pericardial effusion. There is cardiomegaly.  Coronary and aortic atherosclerotic vascular disease is identified.  The patient is status post cholecystectomy.  There is a low attenuating lesion near the dome of the liver measuring 1.3 cm in diameter most consistent with a cyst. A tiny calcified lesion is seen off the serosal surface of the posterior right hepatic lobe which may be due to old granulomatous disease or some other insult. There is mild biliary ductal prominence likely related to cholecystectomy and the patient's age.  No ductal stone is seen. Small bilateral renal cysts are noted.  The kidneys are otherwise unremarkable. The spleen is unremarkable.  Very mild prominence of the distal pancreatic duct is noted without obstructing mass is noted.  The stomach and small bowel are unremarkable.  There is some sigmoid diverticulosis without diverticulitis.  The appendix appears normal.  No lymphadenopathy or fluid is identified. Scattered atherosclerotic vascular disease is seen in the descending abdominal aorta without aneurysm.  No dissection is present.  The prostate gland is enlarged.  Urinary bladder seminal vesicles are normal appearance.  Small fat containing inguinal hernias are seen bilaterally.  Bones demonstrate compression fracture deformities of T7, L1, L2, L3, L4 and L5.  T7 is not included on the prior plain films but the other compression fracture deformities are remote.  Also seen are old right superior and inferior pubic rami fractures.  IMPRESSION:  1.  No acute finding. 2.  Hiatal hernia. 3.  Enlarged prostate gland. 4.  Mild sigmoid diverticulosis without diverticulitis.    Original Report Authenticated By: Bernadene Bell. Maricela Curet, M.D.    US Aorta  04/02/2012  *RADIOLOGY REPORT*  Clinical Data:  Back pain and abdominal pain.  ULTRASOUND OF ABDOMINAL AORTA  Technique:  Ultrasound examination of the abdominal aorta was performed to evaluate for abdominal aortic aneurysm.  Comparison: CT scan, same date.  Abdominal Aorta:  Mild tortuosity and ectasia of the aorta but no focal aneurysm.  Moderate atherosclerotic calcifications.        Maximum AP diameter:  2.2 cm.       Maximum TRV diameter:  2.4 cm.  IMPRESSION: No abdominal aortic aneurysm identified.   Original Report Authenticated By: P. Loralie Champagne, M.D.     Review of Systems  Constitutional: Negative.   HENT: Negative.   Eyes: Negative.   Respiratory: Negative.   Cardiovascular: Negative.   Gastrointestinal: Positive  for abdominal pain.  Genitourinary: Negative.   Musculoskeletal: Positive for back pain.  Skin: Negative.   Neurological: Negative.   Endo/Heme/Allergies: Negative.   Psychiatric/Behavioral: Negative.     Blood pressure 160/101, pulse 81, temperature 98.6 F (37 C), temperature source Oral, resp. rate 19, SpO2 95.00%. Physical Exam  Constitutional: He is oriented to person, place, and time. He appears well-developed and well-nourished. No distress.  HENT:  Head: Normocephalic and atraumatic.  Right Ear: External ear normal.  Left Ear: External ear normal.  Nose: Nose normal.  Mouth/Throat: Oropharynx is clear and moist. No oropharyngeal exudate.  Eyes: Conjunctivae are normal. Pupils are equal, round, and reactive to light. Right eye exhibits no discharge. Left eye exhibits no discharge. No scleral icterus.  Neck: Normal range of motion. Neck supple.  Cardiovascular: Normal rate and regular rhythm.   Respiratory: Effort normal and breath sounds normal. No respiratory distress. He has no wheezes. He has no rales.  GI: Soft. Bowel sounds are normal. He exhibits no distension. There is no  tenderness. There is no rebound and no guarding.  Musculoskeletal: Normal range of motion. He exhibits no edema and no tenderness.  Neurological: He is alert and oriented to person, place, and time.       Moves all extremities.  Skin: Skin is warm and dry. No rash noted. He is not diaphoretic. No erythema.  Psychiatric: His behavior is normal.     Assessment/Plan #1. Abdominal pain - the pain is more on the lower quadrant across the abdomen. I did discuss with the radiologist with this time does not show any definite signs ischemic bowel. We will closely observe in hospital. If the pain recurs patient may need GI or surgery consult. On the other hand the pain may be radiating from his spinal fracture. For which I have ordered MRI T-spine and L-spine. #2. Lactic acidosis - cause not clear. As discussed earlier CT does not show any definite signs of ischemia of the bowel. At this time we will hydrate and recheck lactic acid levels in a.m. Check cardiac enzymes. #3. Hypertension - since patient lactic acid is elevated I'm holding off patient's antihypertensives for now and keep patient on when necessary IV hydralazine for systolic blood pressure more than 160. #4. Hypercalcemia - it is mild. But since patient has also low back pain I have ordered SPEP and UPEP. Check vitamin D and calcium levels and chest x-ray. Patient's hypercalcemia is mild and most likely from dehydration and also from possibly hydrochlorothiazide.  CODE STATUS - full code.  Eduard Clos 04/02/2012, 9:14 PM

## 2012-04-03 ENCOUNTER — Inpatient Hospital Stay (HOSPITAL_COMMUNITY): Payer: Medicare Other

## 2012-04-03 DIAGNOSIS — D649 Anemia, unspecified: Secondary | ICD-10-CM | POA: Diagnosis present

## 2012-04-03 DIAGNOSIS — M549 Dorsalgia, unspecified: Secondary | ICD-10-CM

## 2012-04-03 LAB — COMPREHENSIVE METABOLIC PANEL
AST: 10 U/L (ref 0–37)
BUN: 14 mg/dL (ref 6–23)
CO2: 25 mEq/L (ref 19–32)
Chloride: 101 mEq/L (ref 96–112)
Creatinine, Ser: 1.07 mg/dL (ref 0.50–1.35)
GFR calc Af Amer: 71 mL/min — ABNORMAL LOW (ref >90)
GFR calc non Af Amer: 61 mL/min — ABNORMAL LOW (ref >90)
Glucose, Bld: 86 mg/dL (ref 70–99)
Total Bilirubin: 0.8 mg/dL (ref 0.3–1.2)

## 2012-04-03 LAB — PARATHYROID HORMONE, INTACT (NO CA): PTH: 48.5 pg/mL (ref 14.0–72.0)

## 2012-04-03 LAB — CBC WITH DIFFERENTIAL/PLATELET
Basophils Relative: 0 % (ref 0–1)
Eosinophils Absolute: 0.2 10*3/uL (ref 0.0–0.7)
Lymphs Abs: 1.4 10*3/uL (ref 0.7–4.0)
MCH: 32.1 pg (ref 26.0–34.0)
Neutrophils Relative %: 68 % (ref 43–77)
Platelets: 135 10*3/uL — ABNORMAL LOW (ref 150–400)
RBC: 3.93 MIL/uL — ABNORMAL LOW (ref 4.22–5.81)

## 2012-04-03 LAB — LACTIC ACID, PLASMA: Lactic Acid, Venous: 0.9 mmol/L (ref 0.5–2.2)

## 2012-04-03 MED ORDER — LISINOPRIL 20 MG PO TABS
20.0000 mg | ORAL_TABLET | Freq: Every day | ORAL | Status: DC
  Administered 2012-04-03 – 2012-04-04 (×2): 20 mg via ORAL
  Filled 2012-04-03 (×2): qty 1

## 2012-04-03 MED ORDER — LISINOPRIL-HYDROCHLOROTHIAZIDE 20-25 MG PO TABS: 1.0000 | ORAL_TABLET | Freq: Every day | ORAL | Status: DC

## 2012-04-03 MED ORDER — HYDROCHLOROTHIAZIDE 25 MG PO TABS
25.0000 mg | ORAL_TABLET | Freq: Every day | ORAL | Status: DC
  Administered 2012-04-03 – 2012-04-04 (×2): 25 mg via ORAL
  Filled 2012-04-03 (×2): qty 1

## 2012-04-03 NOTE — Progress Notes (Signed)
Initial review for inpatient status is complete. 

## 2012-04-03 NOTE — Plan of Care (Signed)
Problem: Phase I Progression Outcomes Goal: Initial discharge plan identified Outcome: Completed/Met Date Met:  04/03/12 Pt to return home when medically cleared

## 2012-04-03 NOTE — Progress Notes (Signed)
Patient arrived via stretcher from ED.  He is alert and orient times three, from home, [facility].  Brother and sister are at the bedside.  Brother and Sister wish to have a password for medical information which is STACHU.  The patient is agreeable to this.  Patient was oriented to unit, call bell system, and safety precautions and acknowledges understanding of each.  POC: rehydration and safety.  Will continue to monitor patient.

## 2012-04-03 NOTE — Evaluation (Signed)
Physical Therapy Evaluation Patient Details Name: Christopher [name] MRN: [id number] DOB: 09-Oct-1923 Today's Date: 04/03/2012 Time: 4098-1191 PT Time Calculation (min): 31 min  PT Assessment / Plan / Recommendation Clinical Impression  Pt admitted with abdominal and back pain with elevated lactic acid levels. Pt from home alone and does not have 24hr assist at discharge. Discussed with pt need for AD at this time due to unsteady gait and for assist with all mobility at this time. Pt will benefit from acute therapy to maximize balance, gait and transfers prior to discharge to increase independence and safety. Pt with wet linens on arrival due to urinal spill and assisted with linen change and provided setup for pt to perform pericare.     PT Assessment  Patient needs continued PT services    Follow Up Recommendations  Home health PT;Supervision - Intermittent    Barriers to Discharge Decreased caregiver support      Equipment Recommendations  Rolling walker with 5" wheels    Recommendations for Other Services     Frequency Min 3X/week    Precautions / Restrictions Precautions Precautions: Fall   Pertinent Vitals/Pain 95% on RA, 57HR at rest EOB 92% RA HR 71 with ambulation 94% RA after ambulation HR 76      Mobility  Bed Mobility Bed Mobility: Rolling Right;Right Sidelying to Sit Rolling Right: 5: Supervision;With rail Right Sidelying to Sit: 4: Min assist;HOB flat;With rails Details for Bed Mobility Assistance: cueing for sequence and technique to decrease back pain and increase energy efficiency Transfers Transfers: Sit to Stand;Stand to Sit Sit to Stand: 4: Min guard;From bed Stand to Sit: 4: Min guard;To chair/3-in-1 Details for Transfer Assistance: cueing for hand placement and safety Ambulation/Gait Ambulation/Gait Assistance: 4: Min assist Ambulation Distance (Feet): 500 Feet Assistive device: None;Other (Comment) (hand rail throughout hall) Ambulation/Gait Assistance  Details: min assist with one LOB during gait and pt tending to reach for hand rail despite cueing for unassisted gait to assess balance. Pt unable to maintain balance without assist of rail or assist at pelvis for balance Gait Pattern: Step-through pattern;Decreased stride length Gait velocity: decreased General Gait Details: unsteady with need for support to prevent LOB Stairs: No    Exercises     PT Diagnosis: Difficulty walking  PT Problem List: Decreased activity tolerance;Decreased balance;Decreased mobility;Decreased safety awareness;Decreased knowledge of use of DME PT Treatment Interventions: Gait training;DME instruction;Functional mobility training;Therapeutic activities;Therapeutic exercise;Balance training;Patient/family education   PT Goals Acute Rehab PT Goals PT Goal Formulation: With patient Time For Goal Achievement: 04/10/12 Potential to Achieve Goals: Good Pt will go Supine/Side to Sit: with modified independence;with HOB 0 degrees PT Goal: Supine/Side to Sit - Progress: Goal set today Pt will go Sit to Supine/Side: with modified independence;with HOB 0 degrees PT Goal: Sit to Supine/Side - Progress: Goal set today Pt will go Sit to Stand: with modified independence PT Goal: Sit to Stand - Progress: Goal set today Pt will go Stand to Sit: with modified independence PT Goal: Stand to Sit - Progress: Goal set today Pt will Ambulate: >150 feet;with modified independence;with least restrictive assistive device PT Goal: Ambulate - Progress: Goal set today  Visit Information  Last PT Received On: 04/03/12 Assistance Needed: +1    Subjective Data  Subjective: I like being by myself Patient Stated Goal: be able to return home   Prior Functioning  Home Living Lives With: Alone Available Help at Discharge: Family;Available PRN/intermittently Type of Home: House Home Access: Level entry Home Layout: One  level Bathroom Shower/Tub: Engineer, building services: None Prior Function Level of Independence: Independent Able to Take Stairs?: No Vocation: Retired Musician: Clinical cytogeneticist  Overall Cognitive Status: Impaired Area of Impairment: Safety/judgement Arousal/Alertness: Awake/alert Orientation Level: Appears intact for tasks assessed Behavior During Session: [facility] for tasks performed Safety/Judgement: Decreased awareness of need for assistance    Extremity/Trunk Assessment Right Lower Extremity Assessment RLE ROM/Strength/Tone: [facility] for tasks assessed Left Lower Extremity Assessment LLE ROM/Strength/Tone: WFL for tasks assessed Trunk Assessment Trunk Assessment: Normal   Balance Static Sitting Balance Static Sitting - Balance Support: No upper extremity supported;Feet supported Static Sitting - Level of Assistance: 7: Independent Static Sitting - Comment/# of Minutes: 4 Static Standing Balance Static Standing - Balance Support: Right upper extremity supported Static Standing - Level of Assistance: 5: Stand by assistance Static Standing - Comment/# of Minutes: 3  End of Session PT - End of Session Activity Tolerance: Patient tolerated treatment well Patient left: in chair;with call bell/phone within reach;with family/visitor present  GP     Delorse Lek 04/03/2012, 4:40 PM  Delaney Meigs, PT [phone]

## 2012-04-03 NOTE — Progress Notes (Signed)
[facility] PROGRESS NOTE  CHRSTOPHER MALENFANT ZOX:[id number] DOB: 1923/10/22 DOA: 04/02/2012   Assessment/Plan: Patient Active Hospital Problem List: Abdominal pain (04/02/2012) -has remained afebrile white count has actually come down significantly.cardiac enzymes continue to be negative, chest x-ray shows no acute finding, abdominal ultrasound showed no aneurysms, EKG shows normal sinus rhythm within a first degree AV block no T wave inversion. -CT scan of the abdomen shows no acute intra-abdominal abnormalities, shows no lumbar compression fracture   Lactic acidosis (04/02/2012) -cause not clear. As discussed earlier CT does not show any definite signs of ischemia of the bowel. After hydration is lactic acidosis resolved. -Procalcitonin less than 0.1  Low back pain (04/02/2012) -MRI pending.  Hypercalcemia (04/02/2012) -resolved her hydration. Unclear etiology. -SPEP and UPEP pending   Anemia: -Baseline hemoglobin of 13.826 2003 clinic in 7-2.6 after hydration.  Code Status: Full Family Communication: Patient and daughter Disposition Plan: PT eval pending     LOS: 1 day   Procedures:  MRI 04/03/2012  CT scan of the abdomen 04/04/2012    Subjective: Patient really has no complaints except for back pain  Objective: Filed Vitals:   04/02/12 2100 04/02/12 2225 04/03/12 0300 04/03/12 0512  BP: 154/92 137/89  132/79  Pulse: 75 77  68  Temp:  99.8 F (37.7 C) 97.2 F (36.2 C) 99.1 F (37.3 C)  TempSrc:  Oral Oral Oral  Resp: 18 18  16   Height:  5\' 11"  (1.803 m)    Weight:  73.846 kg (162 lb 12.8 oz)  76.885 kg (169 lb 8 oz)  SpO2: 92% 93%  90%    Intake/Output Summary (Last 24 hours) at 04/03/12 1104 Last data filed at 04/03/12 1006  Gross per 24 hour  Intake   1203 ml  Output    250 ml  Net    953 ml   Weight change:   Exam:  General: Alert, awake, oriented x3, in no acute distress.  HEENT: No bruits, no goiter.  Heart: Regular rate and rhythm,  without murmurs, rubs, gallops.  Lungs: Good air movement, bilateral air movement.  Abdomen: Soft, nontender, nondistended, positive bowel sounds.  Neuro: Grossly intact, nonfocal. Straight leg negative bilaterally  Data Reviewed: Basic Metabolic Panel:  Lab 04/03/12 8119 04/02/12 1520  NA 136 136  K 3.5 3.3*  CL 101 95*  CO2 25 25  GLUCOSE 86 107*  BUN 14 16  CREATININE 1.07 1.11  CALCIUM 8.8 10.6*  MG -- --  PHOS -- --   Liver Function Tests:  Lab 04/03/12 0637 04/02/12 1520  AST 10 16  ALT 5 9  ALKPHOS 40 60  BILITOT 0.8 0.9  PROT 6.5 9.4*  ALBUMIN 3.0* 4.5    Lab 04/02/12 1520  LIPASE 25  AMYLASE --   No results found for this basename: AMMONIA:5 in the last 168 hours CBC:  Lab 04/03/12 0637 04/02/12 1520  WBC 7.2 10.5  NEUTROABS 4.9 7.9*  HGB 12.6* 16.6  HCT 36.8* 47.9  MCV 93.6 94.1  PLT 135* 164   Cardiac Enzymes:  Lab 04/02/12 2244 04/02/12 1520  CKTOTAL 37 --  CKMB 1.9 --  CKMBINDEX -- --  TROPONINI <0.30 <0.30   BNP: No components found with this basename: POCBNP:5 CBG: No results found for this basename: GLUCAP:5 in the last 168 hours  No results found for this or any previous visit (from the past 240 hour(s)).   Studies: Ct Abdomen Pelvis W Contrast  04/02/2012  *RADIOLOGY REPORT*  Clinical  Data: Low back and lower abdominal pain.  CT ABDOMEN AND PELVIS WITH CONTRAST  Technique:  Multidetector CT imaging of the abdomen and pelvis was performed following the standard protocol during bolus administration of intravenous contrast.  Contrast: OMNIPAQUE IOHEXOL 300 MG/ML  SOLN  Comparison: Plain films lumbar spine 10/11/2004.  Findings: Large hiatal hernia is noted.  There is some dependent atelectasis in the lung bases.  No pleural or pericardial effusion. There is cardiomegaly.  Coronary and aortic atherosclerotic vascular disease is identified.  The patient is status post cholecystectomy.  There is a low attenuating lesion near the dome of  the liver measuring 1.3 cm in diameter most consistent with a cyst. A tiny calcified lesion is seen off the serosal surface of the posterior right hepatic lobe which may be due to old granulomatous disease or some other insult. There is mild biliary ductal prominence likely related to cholecystectomy and the patient's age.  No ductal stone is seen. Small bilateral renal cysts are noted.  The kidneys are otherwise unremarkable. The spleen is unremarkable.  Very mild prominence of the distal pancreatic duct is noted without obstructing mass is noted.  The stomach and small bowel are unremarkable.  There is some sigmoid diverticulosis without diverticulitis.  The appendix appears normal.  No lymphadenopathy or fluid is identified. Scattered atherosclerotic vascular disease is seen in the descending abdominal aorta without aneurysm.  No dissection is present.  The prostate gland is enlarged.  Urinary bladder seminal vesicles are normal appearance.  Small fat containing inguinal hernias are seen bilaterally.  Bones demonstrate compression fracture deformities of T7, L1, L2, L3, L4 and L5.  T7 is not included on the prior plain films but the other compression fracture deformities are remote.  Also seen are old right superior and inferior pubic rami fractures.  IMPRESSION:  1.  No acute finding. 2.  Hiatal hernia. 3.  Enlarged prostate gland. 4.  Mild sigmoid diverticulosis without diverticulitis.   Original Report Authenticated By: Bernadene Bell. Maricela Curet, M.D.    US Aorta  04/02/2012  *RADIOLOGY REPORT*  Clinical Data:  Back pain and abdominal pain.  ULTRASOUND OF ABDOMINAL AORTA  Technique:  Ultrasound examination of the abdominal aorta was performed to evaluate for abdominal aortic aneurysm.  Comparison: CT scan, same date.  Abdominal Aorta:  Mild tortuosity and ectasia of the aorta but no focal aneurysm.  Moderate atherosclerotic calcifications.        Maximum AP diameter:  2.2 cm.       Maximum TRV diameter:  2.4 cm.   IMPRESSION: No abdominal aortic aneurysm identified.   Original Report Authenticated By: P. Loralie Champagne, M.D.    Dg Chest Port 1 View  04/02/2012  *RADIOLOGY REPORT*  Clinical Data: Possible lung mass.  PORTABLE CHEST - 1 VIEW  Comparison: PA and lateral chest 08/12/2010.  Findings: Lung volumes are much lower than on the comparison study. No mass is identified.  The patient has a large hiatal hernia. Heart size is upper normal.  No pneumothorax or pleural fluid.  IMPRESSION:  1.  No acute finding in a low-volume chest. 2.  Large hiatal hernia.   Original Report Authenticated By: Bernadene Bell. D'ALESSIO, M.D.     Scheduled Meds:   . aspirin EC  81 mg Oral Daily  .  morphine injection  2 mg Intravenous Once  .  morphine injection  2 mg Intravenous Once  . pantoprazole  40 mg Oral Q1200  . sodium chloride  500 mL  Intravenous Once  . sodium chloride  3 mL Intravenous Q12H  . DISCONTD: sodium chloride   Intravenous STAT  . DISCONTD: aspirin  81 mg Oral Daily  . DISCONTD: iohexol  20 mL Oral Q1 Hr x 2   Continuous Infusions:   . 0.9 % NaCl with KCl 20 mEq / L 100 mL/hr at 04/03/12 9604    Lambert Keto, MD  [facility] Pager [phone]  If 7PM-7AM, please contact night-coverage [email or web address] Password [facility] 04/03/2012, 11:04 AM

## 2012-04-03 NOTE — Progress Notes (Signed)
Nutrition Brief Note  Patient identified on the Malnutrition Screening Tool (MST) report for unsure weight loss, generating a score of 2. Pt states that he has lost weight, "but nothing to be concerned about" UBW is 170 lbs per pt report. Pt states that appetite was normal PTA. Denies any needs at this time.   Body mass index is 23.64 kg/(m^2). Pt meets criteria for WNL based on current BMI.  Wt Readings from Last 5 Encounters:  04/03/12 169 lb 8 oz (76.885 kg)  02/15/12 164 lb 12 oz (74.73 kg)  01/06/12 166 lb 8 oz (75.524 kg)  01/04/12 168 lb 4 oz (76.318 kg)  08/18/11 167 lb 4 oz (75.864 kg)     Current diet order is Clear liquids Labs and medications reviewed.   No nutrition interventions warranted at this time. If nutrition issues arise, please consult RD.   Clarene Duke RD, LDN Pager [phone] After Hours pager [phone]

## 2012-04-04 DIAGNOSIS — M545 Low back pain, unspecified: Secondary | ICD-10-CM

## 2012-04-04 MED ORDER — LISINOPRIL 40 MG PO TABS
40.0000 mg | ORAL_TABLET | Freq: Every day | ORAL | Status: DC
  Administered 2012-04-05: 40 mg via ORAL
  Filled 2012-04-04: qty 1

## 2012-04-04 MED ORDER — MORPHINE SULFATE 4 MG/ML IJ SOLN: 4.0000 mg | INTRAMUSCULAR | Status: DC | PRN

## 2012-04-04 MED ORDER — HYDROCHLOROTHIAZIDE 25 MG PO TABS
25.0000 mg | ORAL_TABLET | Freq: Every day | ORAL | Status: DC
  Administered 2012-04-05: 25 mg via ORAL
  Filled 2012-04-04: qty 1

## 2012-04-04 MED ORDER — HYDROCODONE-ACETAMINOPHEN 5-325 MG PO TABS
1.0000 | ORAL_TABLET | ORAL | Status: DC | PRN
  Administered 2012-04-04: 1 via ORAL
  Filled 2012-04-04: qty 1

## 2012-04-04 MED ORDER — OXYCODONE-ACETAMINOPHEN 5-325 MG PO TABS
1.0000 | ORAL_TABLET | ORAL | Status: DC | PRN
  Administered 2012-04-04 – 2012-04-05 (×4): 2 via ORAL
  Administered 2012-04-05: 1 via ORAL
  Filled 2012-04-04 (×2): qty 2
  Filled 2012-04-04: qty 1
  Filled 2012-04-04: qty 2
  Filled 2012-04-04 (×2): qty 1

## 2012-04-04 MED ORDER — HYDROCODONE-ACETAMINOPHEN 5-325 MG PO TABS: 1.0000 | ORAL_TABLET | ORAL | Status: DC | PRN

## 2012-04-04 MED ORDER — HYDROCODONE-ACETAMINOPHEN 5-325 MG PO TABS: 2.0000 | ORAL_TABLET | ORAL | Status: DC | PRN

## 2012-04-04 NOTE — Progress Notes (Signed)
Physical Therapy Treatment Patient Details Name: Christopher [name] MRN: [id number] DOB: Jan 01, 1924 Today's Date: 04/04/2012 Time: 4098-1191 PT Time Calculation (min): 25 min  PT Assessment / Plan / Recommendation Comments on Treatment Session  Pt admitted with back and abdominal pain with elevated lactic acid levels and compression fxT11. Pt ambulating much better today with use of RW and educated for back precautions and mobility to improve safety and independence. Pt very pleasant and happy to have gotten out of bed. Encouraged mobility with nursing staff.    Follow Up Recommendations       Barriers to Discharge        Equipment Recommendations       Recommendations for Other Services    Frequency     Plan Discharge plan remains appropriate;Frequency remains appropriate    Precautions / Restrictions Precautions Precautions: Fall;Back Precaution Booklet Issued: Yes (comment) Precaution Comments: Pt educated for precautions due to compression fx   Pertinent Vitals/Pain Pain 8/10 at rest in bed, down to 5/10 with ambulation, 6-7/10 at rest in chair end of session. RN notified for pain meds sats 91% on RA end of session with HR 74, pt placed back on 1L [place]    Mobility  Bed Mobility Rolling Right: 5: Supervision Right Sidelying to Sit: 5: Supervision;HOB flat;With rails Details for Bed Mobility Assistance: cueing for sequence and tecnique to protect back and decrease pain Transfers Sit to Stand: 4: Min guard;From bed Stand to Sit: 5: Supervision;To chair/3-in-1 Details for Transfer Assistance: cueing for hand placement, posture and safety Ambulation/Gait Ambulation/Gait Assistance: 5: Supervision Ambulation Distance (Feet): 550 Feet Assistive device: Rolling walker Ambulation/Gait Assistance Details: pt with cues initially to step into RW but pt able to maintain throughout. Pt able to complete head turns and change of direction without assist or LOB and pt more receptive to use of  RW to maintain independence today Gait Pattern: Step-through pattern;Decreased stride length;Trunk flexed Gait velocity: 10ft/ 31sec= .968 Stairs: No    Exercises     PT Diagnosis:    PT Problem List:   PT Treatment Interventions:     PT Goals Acute Rehab PT Goals PT Goal: Supine/Side to Sit - Progress: Progressing toward goal PT Goal: Sit to Stand - Progress: Progressing toward goal PT Goal: Stand to Sit - Progress: Progressing toward goal PT Goal: Ambulate - Progress: Progressing toward goal  Visit Information  Last PT Received On: 04/04/12 Assistance Needed: +1    Subjective Data  Subjective: I should have gotten up a few hours ago   Cognition  Overall Cognitive Status: Appears within functional limits for tasks assessed/performed Arousal/Alertness: Awake/alert Orientation Level: Appears intact for tasks assessed Behavior During Session: [facility] for tasks performed    Balance     End of Session PT - End of Session Equipment Utilized During Treatment: Gait belt Activity Tolerance: Patient tolerated treatment well Patient left: in chair;with call bell/phone within reach Nurse Communication: Mobility status   GP     Delorse Lek 04/04/2012, 8:53 AM Delaney Meigs, PT [phone]

## 2012-04-04 NOTE — Consult Note (Signed)
  Called to see Christopher [name] because lower back pain for the past 48 hours without any trauma. Admitted to [facility] a w/u was done. Clinically the patient is stable ,getting iv analgesia. His main complain is thoraco lumbar pain  More from going from horizontal to vertical position and pain to the hypogastric area. He denies any pain going to the lower extremities or sensory changes. Clinically there is tenderness at palpation in the thoraco-lumbar area. dtr nl, sensory nl. During the he he was able to cross I di revi his legs with no pain. I did review thw mri of the thoracic area as well as the lumbar spine. I did show the mri to his daughter. RECOMMENDATION 1. TLSO by ortho tech. 2 PT/OT CONSULTS. 3 interventional radiology consult re poss vertebroplasty. 4 bone density study to r/o osteoporosis.5 social service consult(patient lives alone)    The mri showed multiple old fractures  In the thoracic and lumbar areas with the one at t11 being the source ot the present pain.. No need for surgical intervention. At the end i was able totalk with dr Radonna Ricker. I will f/u Christopher Brownstein while he is at the hospital

## 2012-04-04 NOTE — Progress Notes (Addendum)
[facility] PROGRESS NOTE  Christopher [name] BJY:[id number] DOB: May 08, 1924 DOA: 04/02/2012   Assessment/Plan: Patient Active Hospital Problem List: Abdominal pain (04/02/2012): -Mild showed subacute compression fracture at T11.? Cause of abdominal pain will consult neurosurgery. -last BM on 8.24.2013, no bladder incontinence. -has remained afebrile white count has actually come down.cardiac enzymes continue to be negative, chest x-ray shows no acute finding, abdominal ultrasound showed no aneurysms, EKG shows normal sinus rhythm within a first degree AV block no T wave inversion. -CT scan of the abdomen shows no acute intra-abdominal abnormalities, shows no lumbar compression fracture   Lactic acidosis (04/02/2012) -cause not clear. As discussed earlier CT does not show any definite signs of ischemia of the bowel. After hydration is lactic acidosis resolved. -Procalcitonin less than 0.1  Low back pain (04/02/2012) -MRI subacute compression fracture, neurosurgery consult. -narcotics for pain.  Hypercalcemia (04/02/2012) -resolved after  hydration. Unclear etiology. -SPEP and UPEP pending follow up with PCP   Anemia: -Baseline hemoglobin of 13 now 12.6  Code Status: Full Family Communication: Patient and daughter Disposition Plan: home on 04/04/2012 if pain controlled     LOS: 2 days   Procedures:  MRI 04/03/2012  CT scan of the abdomen 04/04/2012    Subjective: Patient really has no complaints except for back pain.  Objective: Filed Vitals:   04/03/12 1448 04/03/12 1629 04/03/12 2206 04/04/12 0502  BP: 168/97  150/84 159/91  Pulse: 56 76 69 57  Temp: 98.6 F (37 C)  99.6 F (37.6 C) 98.9 F (37.2 C)  TempSrc: Oral  Oral Oral  Resp: 16  18 20   Height:      Weight:      SpO2: 95% 94% 95% 94%    Intake/Output Summary (Last 24 hours) at 04/04/12 0750 Last data filed at 04/04/12 0865  Gross per 24 hour  Intake 3401.33 ml  Output   2100 ml  Net 1301.33 ml    Weight change:   Exam:  General: Alert, awake, oriented x3, in no acute distress.  HEENT: No bruits, no goiter.  Heart: Regular rate and rhythm, without murmurs, rubs, gallops.  Lungs: Good air movement, bilateral air movement.  Abdomen: Soft, nontender, nondistended, positive bowel sounds.  Neuro: Grossly intact, nonfocal. Straight leg negative bilaterally  Data Reviewed: Basic Metabolic Panel:  Lab 04/03/12 7846 04/02/12 1520  NA 136 136  K 3.5 3.3*  CL 101 95*  CO2 25 25  GLUCOSE 86 107*  BUN 14 16  CREATININE 1.07 1.11  CALCIUM 8.8 10.6*  MG -- --  PHOS -- --   Liver Function Tests:  Lab 04/03/12 0637 04/02/12 1520  AST 10 16  ALT 5 9  ALKPHOS 40 60  BILITOT 0.8 0.9  PROT 6.5 9.4*  ALBUMIN 3.0* 4.5    Lab 04/02/12 1520  LIPASE 25  AMYLASE --   No results found for this basename: AMMONIA:5 in the last 168 hours CBC:  Lab 04/03/12 0637 04/02/12 1520  WBC 7.2 10.5  NEUTROABS 4.9 7.9*  HGB 12.6* 16.6  HCT 36.8* 47.9  MCV 93.6 94.1  PLT 135* 164   Cardiac Enzymes:  Lab 04/02/12 2244 04/02/12 1520  CKTOTAL 37 --  CKMB 1.9 --  CKMBINDEX -- --  TROPONINI <0.30 <0.30   BNP: No components found with this basename: POCBNP:5 CBG: No results found for this basename: GLUCAP:5 in the last 168 hours  No results found for this or any previous visit (from the past 240 hour(s)).   Studies:  Ct Abdomen Pelvis W Contrast  04/02/2012  *RADIOLOGY REPORT*  Clinical Data: Low back and lower abdominal pain.  CT ABDOMEN AND PELVIS WITH CONTRAST  Technique:  Multidetector CT imaging of the abdomen and pelvis was performed following the standard protocol during bolus administration of intravenous contrast.  Contrast: OMNIPAQUE IOHEXOL 300 MG/ML  SOLN  Comparison: Plain films lumbar spine 10/11/2004.  Findings: Large hiatal hernia is noted.  There is some dependent atelectasis in the lung bases.  No pleural or pericardial effusion. There is cardiomegaly.  Coronary  and aortic atherosclerotic vascular disease is identified.  The patient is status post cholecystectomy.  There is a low attenuating lesion near the dome of the liver measuring 1.3 cm in diameter most consistent with a cyst. A tiny calcified lesion is seen off the serosal surface of the posterior right hepatic lobe which may be due to old granulomatous disease or some other insult. There is mild biliary ductal prominence likely related to cholecystectomy and the patient's age.  No ductal stone is seen. Small bilateral renal cysts are noted.  The kidneys are otherwise unremarkable. The spleen is unremarkable.  Very mild prominence of the distal pancreatic duct is noted without obstructing mass is noted.  The stomach and small bowel are unremarkable.  There is some sigmoid diverticulosis without diverticulitis.  The appendix appears normal.  No lymphadenopathy or fluid is identified. Scattered atherosclerotic vascular disease is seen in the descending abdominal aorta without aneurysm.  No dissection is present.  The prostate gland is enlarged.  Urinary bladder seminal vesicles are normal appearance.  Small fat containing inguinal hernias are seen bilaterally.  Bones demonstrate compression fracture deformities of T7, L1, L2, L3, L4 and L5.  T7 is not included on the prior plain films but the other compression fracture deformities are remote.  Also seen are old right superior and inferior pubic rami fractures.  IMPRESSION:  1.  No acute finding. 2.  Hiatal hernia. 3.  Enlarged prostate gland. 4.  Mild sigmoid diverticulosis without diverticulitis.   Original Report Authenticated By: Bernadene Bell. Maricela Curet, M.D.    US Aorta  04/02/2012  *RADIOLOGY REPORT*  Clinical Data:  Back pain and abdominal pain.  ULTRASOUND OF ABDOMINAL AORTA  Technique:  Ultrasound examination of the abdominal aorta was performed to evaluate for abdominal aortic aneurysm.  Comparison: CT scan, same date.  Abdominal Aorta:  Mild tortuosity and  ectasia of the aorta but no focal aneurysm.  Moderate atherosclerotic calcifications.        Maximum AP diameter:  2.2 cm.       Maximum TRV diameter:  2.4 cm.  IMPRESSION: No abdominal aortic aneurysm identified.   Original Report Authenticated By: P. Loralie Champagne, M.D.    Dg Chest Port 1 View  04/02/2012  *RADIOLOGY REPORT*  Clinical Data: Possible lung mass.  PORTABLE CHEST - 1 VIEW  Comparison: PA and lateral chest 08/12/2010.  Findings: Lung volumes are much lower than on the comparison study. No mass is identified.  The patient has a large hiatal hernia. Heart size is upper normal.  No pneumothorax or pleural fluid.  IMPRESSION:  1.  No acute finding in a low-volume chest. 2.  Large hiatal hernia.   Original Report Authenticated By: Bernadene Bell. D'ALESSIO, M.D.     Scheduled Meds:    . aspirin EC  81 mg Oral Daily  . lisinopril  20 mg Oral Daily   And  . hydrochlorothiazide  25 mg Oral Daily  . pantoprazole  40 mg Oral Q1200  . sodium chloride  3 mL Intravenous Q12H  . DISCONTD: lisinopril-hydrochlorothiazide  1 tablet Oral Daily   Continuous Infusions:    . 0.9 % NaCl with KCl 20 mEq / L 100 mL/hr at 04/04/12 0604    Lambert Keto, MD  [facility] Pager [phone]  If 7PM-7AM, please contact night-coverage [email or web address] Password [facility] 04/04/2012, 7:50 AM

## 2012-04-04 NOTE — Discharge Summary (Addendum)
Physician Discharge Summary  Christopher [name] RUE:[id number] DOB: 01/30/1924 DOA: 04/02/2012  PCP: Eustaquio Boyden, MD  Admit date: 04/02/2012 Discharge date: 04/05/2012  Recommendations for Outpatient Follow-up:  1. SPEP and UPEP still pending 2. Outpatient Dexa scan  Discharge Diagnoses:  Principal Problem:  *Abdominal pain-resolved Active Problems:  Low back pain-2/2 Vertebral compression fractures  Lactic acidosis  Hypercalcemia  Anemia  Discharge Condition: stable  Disposition:  Follow-up Information    Follow up with Eustaquio Boyden, MD in 2 weeks. (follow up on labs will need dexa scan for osteoporosis)    Contact information:   [address] [place] [address] [phone]          Diet:regular Wt Readings from Last 3 Encounters:  04/03/12 76.885 kg (169 lb 8 oz)  02/15/12 74.73 kg (164 lb 12 oz)  01/06/12 75.524 kg (166 lb 8 oz)    History of present illness:  78 year old male with history of hypertension and elevated PSA has been experiencing some low back pain since Friday, 2 days ago after he worked in the yard in his house. The pain progressively worsened. Today after church patient also started developing pain across his lower quadrants of his abdomen. There was no associated nausea vomiting or diarrhea. Denies any fever chills. Since the pain was excruciating patient came to the ER. CT abdomen pelvis done in the ER did not show any acute except for old fractures in the lumbar spine. Patient's lactic acid was elevated on presentation which further worsened. At this time patient has been admitted for further management of his abdominal pain low back pain and lactic acidosis. Patient abdominal pain has been controlled after 2 doses of morphine.  Denies any chest pain or shortness of breath.   Hospital Course:  Abdominal pain (04/02/2012)/Low back pain (04/02/2012): -He remained afebrile white count has actually come down.cardiac enzymes  remained negative, chest x-ray shows no acute finding, abdominal ultrasound showed no aneurysms, EKG shows normal sinus rhythm within a first degree AV block no T wave inversion. He was started on oxycodone which has controlled his pain well. Physical therapy evaluated and recommended intermittent home supervision and physical therapy at home. -CT scan of the abdomen shows no acute intra-abdominal abnormalities, shows no new lumbar compression fracture  -MRI showed Mild subacute compression fracture at T11.Cause of abdominal pain most likely compression fracture consulted Neurosurgery who recommended brace and further evaluation for his osteoporosis. We'll need a DEXA scan as an outpatient.  Lactic acidosis (04/02/2012) -cause not clear. As discussed earlier CT does not show any definite signs of ischemia of the bowel. After hydration is lactic acidosis resolved.  -Procalcitonin less than 0.1. His Bp remained stable.  Hypercalcemia (04/02/2012) -resolved after hydration. Unclear etiology.  -SPEP and UPEP pending follow up as an outpatient. -will need further evaluation as an outpatient for hypercalcemia.  Anemia:  -Baseline hemoglobin of 13 now 12.   Discharge Exam: Filed Vitals:   04/05/12 1010  BP: 152/71  Pulse:   Temp:   Resp:    Filed Vitals:   04/04/12 1500 04/04/12 2206 04/05/12 0629 04/05/12 1010  BP: 137/81 153/77 164/85 152/71  Pulse: 54 54 50   Temp: 98.3 F (36.8 C) 98.3 F (36.8 C) 98.3 F (36.8 C)   TempSrc: Oral Oral Oral   Resp: 20 18 20    Height:      Weight:      SpO2: 95% 97% 99%    See progress note  Discharge  Instructions  Discharge Orders    Future Appointments: Provider: Department: Dept Phone: Center:   02/12/2013 8:15 AM [facility] [place] [phone] [facility]   02/15/2013 8:30 AM Eustaquio Boyden, MD [facility] [phone] [facility]     Future Orders Please Complete By Expires   Diet - low sodium heart healthy       Increase activity slowly        Medication List  As of 04/05/2012 10:15 AM   TAKE these medications         aspirin 81 MG tablet   Take 81 mg by mouth daily.      EUCERIN CALMING DAILY MOIST Crea   Apply topically. Apply four times a day to affected area      Flax Seed Oil 1000 MG Caps   Take 1 capsule by mouth daily.      KLOR-CON M20 20 MEQ tablet   Generic drug: potassium chloride SA   TAKE ONE BY MOUTH DAILY      lisinopril-hydrochlorothiazide 20-25 MG per tablet   Commonly known as: PRINZIDE,ZESTORETIC   TAKE 1 TABLET BY MOUTH DAILY.      loratadine 10 MG tablet   Commonly known as: CLARITIN   Take 1 tablet (10 mg total) by mouth daily.      NEXIUM 40 MG capsule   Generic drug: esomeprazole   TAKE ONE CAPSULE BY MOUTH EVERY DAY      oxyCODONE 5 MG immediate release tablet   Commonly known as: Oxy IR/ROXICODONE   Take 1 tablet (5 mg total) by mouth every 4 (four) hours as needed for pain.      polyethylene glycol packet   Commonly known as: MIRALAX / GLYCOLAX   Take 17 g by mouth 2 (two) times daily.      TYLENOL 500 MG tablet   Generic drug: acetaminophen   Take 1,000 mg by mouth every 6 (six) hours as needed. For pain      Vitamin D 1000 UNITS capsule   Take 1,000 Units by mouth daily.              The results of significant diagnostics from this hospitalization (including imaging, microbiology, ancillary and laboratory) are listed below for reference.    Significant Diagnostic Studies: Mr Thoracic Spine Wo Contrast  04/03/2012  *RADIOLOGY REPORT*  Clinical Data:  Severe low back pain and weakness.  MRI THORACIC AND LUMBAR SPINE WITHOUT CONTRAST  Technique:  Multiplanar and multiecho pulse sequences of the thoracic and lumbar spine were obtained without intravenous contrast.  Comparison:  CT of the abdomen and pelvis 04/02/2012.  MRI THORACIC SPINE  Findings: Normal signal is present throughout the thoracic spinal cord.  An acute / subacute superior  endplate compression fracture is present at L1.  The vertebral body height is reduced to 17 mm compared 22 mm at the T10 level.  Edematous changes are evident. An anterior wedge compression fracture at T7 is remote.  No significant retropulsion of bone is present at either level. Facet hypertrophy results in mild osseous foraminal narrowing at T9- 10 and T10-11 bilaterally.  Bibasilar atelectasis is present, right greater than left.  There is aneurysmal dilation of the descending thoracic aorta measuring 3.6 cm in short access.  IMPRESSION:  1.  No acute / subacute superior endplate compression fracture at T11 without significant retropulsion of bone. 2.  Mild osseous foraminal narrowing at T9-10 and T10-11 bilaterally secondary to facet hypertrophy. 3.  Remote anterior endplate compression fracture of  T7 with exaggerated kyphosis. 4.  No significant disc herniation or stenosis elsewhere. 5.  Descending thoracic aortic aneurysm.  MRI LUMBAR SPINE  Findings: Five lumbar-type vertebral bodies are present.  Leftward curvature lumbar spine is centered at L2-3.  A remote vertebral plana compression fracture at L1 is stable.  Remote fractures are noted at L2 and L3.  There is some loss of height involving the L4 and L5 vertebral bodies as well.  The acute / subacute T11 superior endplate compression fracture is again noted.  There is some edema or hemorrhage anterior to the T11 vertebral body.  No other acute fracture is present.  T12-L1:  No significant disc herniation or stenosis is present.  L1-2:  A mild rightward disc bulge is present without significant stenosis.  The conus medullaris terminates posterior to L1 without significant stenosis.  L2-3:  A broad-based disc herniation is asymmetric to the right. Asymmetric facet hypertrophy is evident.  This results in mild right lateral recess narrowing.  There is some encroachment on the right neural foramen without significant stenosis.  L3-4:  A mild broad-based disc  bulge is present.  The disc bulges into the inferior recess of the right lateral foramen without significant stenosis.  L4-5:  A mild broad-based disc bulge is present.  Mild facet hypertrophy is evident bilaterally.  This results in some encroachment on the neural foramina bilaterally without compressive stenosis.  L5-S1:  A mild broad-based disc bulge is present.  Mild facet hypertrophy is evident bilaterally.  No significant stenosis is present.  IMPRESSION:  1.  Acute / subacute superior endplate compression fracture at T11. 2.  Fractures at L1, L2, and L3 are remote. 3.  Levoconvex scoliosis of the lumbar spine is centered at L2-3. 4.  Mild right lateral recess narrowing at L2-3. 5.  Disc bulging and facet hypertrophy result in some encroachment on the neural foramina at L2-3, L3-4 and L4-5 without significant stenosis.   Original Report Authenticated By: Jamesetta Orleans. MATTERN, M.D.    Mr Lumbar Spine Wo Contrast  04/03/2012  *RADIOLOGY REPORT*  Clinical Data:  Severe low back pain and weakness.  MRI THORACIC AND LUMBAR SPINE WITHOUT CONTRAST  Technique:  Multiplanar and multiecho pulse sequences of the thoracic and lumbar spine were obtained without intravenous contrast.  Comparison:  CT of the abdomen and pelvis 04/02/2012.  MRI THORACIC SPINE  Findings: Normal signal is present throughout the thoracic spinal cord.  An acute / subacute superior endplate compression fracture is present at L1.  The vertebral body height is reduced to 17 mm compared 22 mm at the T10 level.  Edematous changes are evident. An anterior wedge compression fracture at T7 is remote.  No significant retropulsion of bone is present at either level. Facet hypertrophy results in mild osseous foraminal narrowing at T9- 10 and T10-11 bilaterally.  Bibasilar atelectasis is present, right greater than left.  There is aneurysmal dilation of the descending thoracic aorta measuring 3.6 cm in short access.  IMPRESSION:  1.  No acute / subacute  superior endplate compression fracture at T11 without significant retropulsion of bone. 2.  Mild osseous foraminal narrowing at T9-10 and T10-11 bilaterally secondary to facet hypertrophy. 3.  Remote anterior endplate compression fracture of T7 with exaggerated kyphosis. 4.  No significant disc herniation or stenosis elsewhere. 5.  Descending thoracic aortic aneurysm.  MRI LUMBAR SPINE  Findings: Five lumbar-type vertebral bodies are present.  Leftward curvature lumbar spine is centered at L2-3.  A remote vertebral plana  compression fracture at L1 is stable.  Remote fractures are noted at L2 and L3.  There is some loss of height involving the L4 and L5 vertebral bodies as well.  The acute / subacute T11 superior endplate compression fracture is again noted.  There is some edema or hemorrhage anterior to the T11 vertebral body.  No other acute fracture is present.  T12-L1:  No significant disc herniation or stenosis is present.  L1-2:  A mild rightward disc bulge is present without significant stenosis.  The conus medullaris terminates posterior to L1 without significant stenosis.  L2-3:  A broad-based disc herniation is asymmetric to the right. Asymmetric facet hypertrophy is evident.  This results in mild right lateral recess narrowing.  There is some encroachment on the right neural foramen without significant stenosis.  L3-4:  A mild broad-based disc bulge is present.  The disc bulges into the inferior recess of the right lateral foramen without significant stenosis.  L4-5:  A mild broad-based disc bulge is present.  Mild facet hypertrophy is evident bilaterally.  This results in some encroachment on the neural foramina bilaterally without compressive stenosis.  L5-S1:  A mild broad-based disc bulge is present.  Mild facet hypertrophy is evident bilaterally.  No significant stenosis is present.  IMPRESSION:  1.  Acute / subacute superior endplate compression fracture at T11. 2.  Fractures at L1, L2, and L3 are  remote. 3.  Levoconvex scoliosis of the lumbar spine is centered at L2-3. 4.  Mild right lateral recess narrowing at L2-3. 5.  Disc bulging and facet hypertrophy result in some encroachment on the neural foramina at L2-3, L3-4 and L4-5 without significant stenosis.   Original Report Authenticated By: Jamesetta Orleans. MATTERN, M.D.    Ct Abdomen Pelvis W Contrast  04/02/2012  *RADIOLOGY REPORT*  Clinical Data: Low back and lower abdominal pain.  CT ABDOMEN AND PELVIS WITH CONTRAST  Technique:  Multidetector CT imaging of the abdomen and pelvis was performed following the standard protocol during bolus administration of intravenous contrast.  Contrast: OMNIPAQUE IOHEXOL 300 MG/ML  SOLN  Comparison: Plain films lumbar spine 10/11/2004.  Findings: Large hiatal hernia is noted.  There is some dependent atelectasis in the lung bases.  No pleural or pericardial effusion. There is cardiomegaly.  Coronary and aortic atherosclerotic vascular disease is identified.  The patient is status post cholecystectomy.  There is a low attenuating lesion near the dome of the liver measuring 1.3 cm in diameter most consistent with a cyst. A tiny calcified lesion is seen off the serosal surface of the posterior right hepatic lobe which may be due to old granulomatous disease or some other insult. There is mild biliary ductal prominence likely related to cholecystectomy and the patient's age.  No ductal stone is seen. Small bilateral renal cysts are noted.  The kidneys are otherwise unremarkable. The spleen is unremarkable.  Very mild prominence of the distal pancreatic duct is noted without obstructing mass is noted.  The stomach and small bowel are unremarkable.  There is some sigmoid diverticulosis without diverticulitis.  The appendix appears normal.  No lymphadenopathy or fluid is identified. Scattered atherosclerotic vascular disease is seen in the descending abdominal aorta without aneurysm.  No dissection is present.  The  prostate gland is enlarged.  Urinary bladder seminal vesicles are normal appearance.  Small fat containing inguinal hernias are seen bilaterally.  Bones demonstrate compression fracture deformities of T7, L1, L2, L3, L4 and L5.  T7 is not included on  the prior plain films but the other compression fracture deformities are remote.  Also seen are old right superior and inferior pubic rami fractures.  IMPRESSION:  1.  No acute finding. 2.  Hiatal hernia. 3.  Enlarged prostate gland. 4.  Mild sigmoid diverticulosis without diverticulitis.   Original Report Authenticated By: Bernadene Bell. Maricela Curet, M.D.    US Aorta  04/02/2012  *RADIOLOGY REPORT*  Clinical Data:  Back pain and abdominal pain.  ULTRASOUND OF ABDOMINAL AORTA  Technique:  Ultrasound examination of the abdominal aorta was performed to evaluate for abdominal aortic aneurysm.  Comparison: CT scan, same date.  Abdominal Aorta:  Mild tortuosity and ectasia of the aorta but no focal aneurysm.  Moderate atherosclerotic calcifications.        Maximum AP diameter:  2.2 cm.       Maximum TRV diameter:  2.4 cm.  IMPRESSION: No abdominal aortic aneurysm identified.   Original Report Authenticated By: P. Loralie Champagne, M.D.    Dg Chest Port 1 View  04/02/2012  *RADIOLOGY REPORT*  Clinical Data: Possible lung mass.  PORTABLE CHEST - 1 VIEW  Comparison: PA and lateral chest 08/12/2010.  Findings: Lung volumes are much lower than on the comparison study. No mass is identified.  The patient has a large hiatal hernia. Heart size is upper normal.  No pneumothorax or pleural fluid.  IMPRESSION:  1.  No acute finding in a low-volume chest. 2.  Large hiatal hernia.   Original Report Authenticated By: Bernadene Bell. Maricela Curet, M.D.     Microbiology: No results found for this or any previous visit (from the past 240 hour(s)).   Labs: Basic Metabolic Panel:  Lab 04/03/12 4098 04/02/12 1520  NA 136 136  K 3.5 3.3*  CL 101 95*  CO2 25 25  GLUCOSE 86 107*  BUN 14 16    CREATININE 1.07 1.11  CALCIUM 8.8 10.6*  MG -- --  PHOS -- --   Liver Function Tests:  Lab 04/03/12 0637 04/02/12 1520  AST 10 16  ALT 5 9  ALKPHOS 40 60  BILITOT 0.8 0.9  PROT 6.5 9.4*  ALBUMIN 3.0* 4.5    Lab 04/02/12 1520  LIPASE 25  AMYLASE --   No results found for this basename: AMMONIA:5 in the last 168 hours CBC:  Lab 04/03/12 0637 04/02/12 1520  WBC 7.2 10.5  NEUTROABS 4.9 7.9*  HGB 12.6* 16.6  HCT 36.8* 47.9  MCV 93.6 94.1  PLT 135* 164   Cardiac Enzymes:  Lab 04/02/12 2244 04/02/12 1520  CKTOTAL 37 --  CKMB 1.9 --  CKMBINDEX -- --  TROPONINI <0.30 <0.30   BNP: No components found with this basename: POCBNP:5 CBG: No results found for this basename: GLUCAP:5 in the last 168 hours  Time coordinating discharge: greater than 35 minutes.  SignedJeoffrey Massed  [facility] 04/05/2012, 10:15 AM

## 2012-04-04 NOTE — Progress Notes (Signed)
Patient and daughter wish to use [facility] for [facility] needs. But had reservations about going home. Due to patient reservations about returning home, I contacted physician who stated he would speak with the patient and his daughter shortly. Physician also put in the home health orders. Advised patient that he would also be going home with [facility] services but still apprehensive.

## 2012-04-05 ENCOUNTER — Telehealth: Payer: Self-pay | Admitting: Family Medicine

## 2012-04-05 LAB — PROTEIN ELECTROPHORESIS, SERUM
Albumin ELP: 51.7 % — ABNORMAL LOW (ref 55.8–66.1)
Alpha-1-Globulin: 4.7 % (ref 2.9–4.9)
Alpha-2-Globulin: 12.1 % — ABNORMAL HIGH (ref 7.1–11.8)
Gamma Globulin: 18.8 % (ref 11.1–18.8)

## 2012-04-05 LAB — UIFE/LIGHT CHAINS/TP QN, 24-HR UR
Albumin, U: DETECTED
Beta, Urine: DETECTED — AB
Free Kappa/Lambda Ratio: 24.8 ratio — ABNORMAL HIGH (ref 2.04–10.37)
Free Lambda Lt Chains,Ur: 0.5 mg/dL (ref 0.02–0.67)
Total Protein, Urine: 14 mg/dL

## 2012-04-05 MED ORDER — POLYETHYLENE GLYCOL 3350 17 G PO PACK
17.0000 g | PACK | Freq: Two times a day (BID) | ORAL | Status: DC
  Administered 2012-04-05: 17 g via ORAL
  Filled 2012-04-05 (×3): qty 1

## 2012-04-05 MED ORDER — POLYETHYLENE GLYCOL 3350 17 G PO PACK: 17.0000 g | PACK | Freq: Two times a day (BID) | ORAL | Status: AC

## 2012-04-05 MED ORDER — OXYCODONE HCL 5 MG PO TABS: 5.0000 mg | ORAL_TABLET | ORAL | Status: DC | PRN

## 2012-04-05 MED ORDER — BISACODYL 5 MG PO TBEC
5.0000 mg | DELAYED_RELEASE_TABLET | Freq: Once | ORAL | Status: AC
  Administered 2012-04-05: 5 mg via ORAL
  Filled 2012-04-05: qty 1

## 2012-04-05 NOTE — Progress Notes (Signed)
Orthopedic Tech Progress Note Patient Details:  Christopher [name] [birth year] [id number]  Patient ID: Christopher [name], male   DOB: 30-May-1924, 78 y.o.   MRN: [id number]   Christopher [name] 04/05/2012, 12:26 PM CALLED BIO TECH FOR TLSO BRACE

## 2012-04-05 NOTE — Telephone Encounter (Signed)
Can we call pt for update on pain - recently admitted with compression fracture, discharged today.  I would like to see him next week.

## 2012-04-05 NOTE — Progress Notes (Signed)
Patient discharged. Taken to vehicle via wheelchair.

## 2012-04-06 ENCOUNTER — Telehealth: Payer: Self-pay | Admitting: Family Medicine

## 2012-04-06 NOTE — Telephone Encounter (Signed)
Spoke with patient's son who advised patient already has follow up scheduled for tomorrow.

## 2012-04-06 NOTE — Telephone Encounter (Signed)
[facility]- Carollee Leitz out to evaluate the status of patient today.    - Patient of Dr.  Levon Hedger.  Patient has been in the hospital for Compression Fracture.  They have ordered Physical Therapy, and Nursing /CNA to assist with bathing. Nurse Lafayette Dragon states that during her assessment she has discovered -  (1) He needs Occupation therapy. Currently in a back brace.   (2) Constipation issues, he has be given Miralax.  Last BM Saturday. Abdomen is tight.  (3) Crackles in Left Lower Lobe.  Temp 99.  1.  Concerned for Respiratory status.   (4) Hiccups fairly constant since back injury.  Nerve irriation.  (4) Shortness of breath with excertion and weak.Marland Kitchen PLEASE ADVISE ORDERS AND GUIDANCE FOR PATIENT.

## 2012-04-07 ENCOUNTER — Encounter: Payer: Self-pay | Admitting: Family Medicine

## 2012-04-07 ENCOUNTER — Ambulatory Visit (INDEPENDENT_AMBULATORY_CARE_PROVIDER_SITE_OTHER): Payer: Medicare Other | Admitting: Family Medicine

## 2012-04-07 VITALS — BP 124/66 | HR 86 | Temp 98.6°F

## 2012-04-07 DIAGNOSIS — IMO0002 Reserved for concepts with insufficient information to code with codable children: Secondary | ICD-10-CM

## 2012-04-07 DIAGNOSIS — R0600 Dyspnea, unspecified: Secondary | ICD-10-CM

## 2012-04-07 DIAGNOSIS — R0989 Other specified symptoms and signs involving the circulatory and respiratory systems: Secondary | ICD-10-CM

## 2012-04-07 DIAGNOSIS — R82998 Other abnormal findings in urine: Secondary | ICD-10-CM

## 2012-04-07 DIAGNOSIS — K5900 Constipation, unspecified: Secondary | ICD-10-CM

## 2012-04-07 DIAGNOSIS — R3915 Urgency of urination: Secondary | ICD-10-CM

## 2012-04-07 LAB — POCT URINALYSIS DIPSTICK
Blood, UA: NEGATIVE
Nitrite, UA: NEGATIVE
Protein, UA: NEGATIVE
Spec Grav, UA: 1.015
Urobilinogen, UA: 1
pH, UA: 6.5

## 2012-04-07 MED ORDER — OXYCODONE HCL 5 MG PO TABS: 5.0000 mg | ORAL_TABLET | Freq: Two times a day (BID) | ORAL | Status: DC | PRN

## 2012-04-07 MED ORDER — OXYCODONE HCL 5 MG PO TABS: 5.0000 mg | ORAL_TABLET | ORAL | Status: DC | PRN

## 2012-04-07 NOTE — Telephone Encounter (Signed)
Will see today.  

## 2012-04-07 NOTE — Assessment & Plan Note (Addendum)
Significant pain from compression fracture, however I do think SOB and constipation caused by narcotic.  Will decrease narcotic, schedule tylenol, reassess next week. Noted multiple compression fractures on MRI.  Unclear why.  Check DEXA, consider bone scan if pt interested in pursuing h/o elevated PSA. Script for electronic adjustable hospital bed faxed to [facility]. SPEP - no M spike.  Nonspecific pattern. random urine IFE - no monoclonal gammopathy, no bence jones proteins in urine.  + polyclonal gammopathy of kappa light chains- ?reactive. PTH - WNL

## 2012-04-07 NOTE — Progress Notes (Signed)
Subjective:    Patient ID: Christopher [name], male    DOB: Sep 05, 1923, 78 y.o.   MRN: [id number]  HPI CC: hospital follow up.  Hospitalization: 8/25-28/2013 Post-discharge phone call: 04/06/2012  Records reviewed.  Recommendations for Outpatient Follow-up:  1. SPEP and UPEP still pending  2. Outpatient Dexa scan  Discharge Diagnoses:  Principal Problem:  *Abdominal pain-resolved  Active Problems:  Low back pain-2/2 Vertebral compression fractures  Lactic acidosis  Hypercalcemia  Anemia  Admitted with abd pain and lower back pain, CT showing no evidence of ischemic bowel although lactic acid elevated (dropped with hydration).    No BM since Saturday morning.  Passing gas ok.  Yesterday morning, tried miralax, then dulcolax.  No appetite, feeling full.  Bad hiccups.  Using oxycodone 5mg  IR Q4 hours for pain.  Using about 5x/day.  Taking 2 tylenols/day (500mg ).  Noticing some shortness of breath.  O2 today 93% on RA.  Minimal cough and wheezing.  Nonproductive.  No fevers/chills.  No chest pain/tightness.  No dysuria.  Noted worsening urgency/frequency since discharge from hospital.  Has urinal next to bed, having accidents despite this.   Current bed at home is very high - having trouble getting into and out of bed.  MRI THORACIC AND LUMBAR SPINE WITHOUT CONTRAST   MRI THORACIC SPINE  IMPRESSION:  1. No acute / subacute superior endplate compression fracture at  T11 without significant retropulsion of bone.  2. Mild osseous foraminal narrowing at T9-10 and T10-11  bilaterally secondary to facet hypertrophy.  3. Remote anterior endplate compression fracture of T7 with  exaggerated kyphosis.  4. No significant disc herniation or stenosis elsewhere.  5. Descending thoracic aortic aneurysm.   MRI LUMBAR SPINE  IMPRESSION:  1. Acute / subacute superior endplate compression fracture at T11.  2. Fractures at L1, L2, and L3 are remote.  3. Levoconvex scoliosis of the lumbar spine is  centered at L2-3.  4. Mild right lateral recess narrowing at L2-3.  5. Disc bulging and facet hypertrophy result in some encroachment  on the neural foramina at L2-3, L3-4 and L4-5 without significant  stenosis.  Original Report Authenticated By: Jamesetta Orleans. [name], M.D.   Past Medical History  Diagnosis Date  . GERD (gastroesophageal reflux disease) 08/1996  . Hyperlipemia 08/1986  . Hypertension Pre 1996  . Osteoporosis 12/2004  . Pneumonia 22 yoa  . History of Doppler echocardiogram 05/1995    MVP, MOD AI, MILD TR, MILD MR  . Elevated PSA     decided against further eval  . Diverticulosis   . Bilateral sensorineural hearing loss 02/2012    some conductive left side    Review of Systems Per HPI    Objective:   Physical Exam  Nursing note and vitals reviewed. Constitutional: He appears well-developed and well-nourished. No distress.       Sitting in wheelchair in brace  HENT:  Head: Normocephalic and atraumatic.  Mouth/Throat: Oropharynx is clear and moist. No oropharyngeal exudate.  Eyes: Conjunctivae and EOM are normal. Pupils are equal, round, and reactive to light.  Neck: Normal range of motion. Neck supple. Carotid bruit is not present.  Cardiovascular: Normal rate, regular rhythm, normal heart sounds and intact distal pulses.   No murmur heard. Pulmonary/Chest: Effort normal and breath sounds normal. No respiratory distress. He has no wheezes. He has no rales.       Brace removed, lungs clear  Musculoskeletal: He exhibits no edema.  Lymphadenopathy:    He has no  cervical adenopathy.  Skin: Skin is warm and dry. No rash noted.  Psychiatric: He has a normal mood and affect.       Assessment & Plan:

## 2012-04-07 NOTE — Patient Instructions (Addendum)
Pass by Marion's office to schedule dexa scan. I think you're taking too much oxycodone  Take 2 pills of tylenol (500mg  for a total of 1000mg ) three times a day.  Only use oxycodone immediate release for breakthrough pain once or twice a day if possible. Take miralax once or twice daily - hold for diarrhea. Good to see you today.  Return to see me in 1 week for follow up.

## 2012-04-08 ENCOUNTER — Encounter: Payer: Self-pay | Admitting: Family Medicine

## 2012-04-08 DIAGNOSIS — R3915 Urgency of urination: Secondary | ICD-10-CM | POA: Insufficient documentation

## 2012-04-08 DIAGNOSIS — R0600 Dyspnea, unspecified: Secondary | ICD-10-CM | POA: Insufficient documentation

## 2012-04-08 NOTE — Assessment & Plan Note (Signed)
I also think this is related to narcotics or brace. Lungs clear on exam today, afebrile, no cough evident. Will see if decreased narcotics will improve sxs.

## 2012-04-08 NOTE — Assessment & Plan Note (Addendum)
Consider rechecking next week - normal on discharge from hospital. Will need to check regardless if dexa showing OP and we start bisphosphonate. H/o osteoporosis.

## 2012-04-08 NOTE — Assessment & Plan Note (Signed)
Checked UA today - WNL.  No evidence of infection.

## 2012-04-08 NOTE — Assessment & Plan Note (Signed)
Anticipate narcotic-related constipation.   Decrease oxycodone 5mg  to bid prn, schedule tylenol. Start miralax 1-2x/day. Passing gas fine.  abd exam benign. Recheck next week.

## 2012-04-09 ENCOUNTER — Encounter: Payer: Self-pay | Admitting: Family Medicine

## 2012-04-09 DIAGNOSIS — I441 Atrioventricular block, second degree: Secondary | ICD-10-CM

## 2012-04-09 HISTORY — DX: Atrioventricular block, second degree: I44.1

## 2012-04-10 ENCOUNTER — Encounter (HOSPITAL_COMMUNITY): Payer: Self-pay | Admitting: Family Medicine

## 2012-04-10 ENCOUNTER — Emergency Department (HOSPITAL_COMMUNITY)
Admission: EM | Admit: 2012-04-10 | Discharge: 2012-04-10 | Disposition: A | Discharge status: Home / Self Care | Payer: Medicare Other | Attending: Emergency Medicine | Admitting: Emergency Medicine

## 2012-04-10 DIAGNOSIS — E785 Hyperlipidemia, unspecified: Secondary | ICD-10-CM | POA: Insufficient documentation

## 2012-04-10 DIAGNOSIS — I10 Essential (primary) hypertension: Secondary | ICD-10-CM | POA: Insufficient documentation

## 2012-04-10 DIAGNOSIS — K219 Gastro-esophageal reflux disease without esophagitis: Secondary | ICD-10-CM | POA: Insufficient documentation

## 2012-04-10 DIAGNOSIS — M810 Age-related osteoporosis without current pathological fracture: Secondary | ICD-10-CM | POA: Insufficient documentation

## 2012-04-10 DIAGNOSIS — S22009A Unspecified fracture of unspecified thoracic vertebra, initial encounter for closed fracture: Secondary | ICD-10-CM

## 2012-04-10 DIAGNOSIS — M549 Dorsalgia, unspecified: Secondary | ICD-10-CM | POA: Insufficient documentation

## 2012-04-10 DIAGNOSIS — Z87891 Personal history of nicotine dependence: Secondary | ICD-10-CM | POA: Insufficient documentation

## 2012-04-10 MED ORDER — IBUPROFEN 400 MG PO TABS
600.0000 mg | ORAL_TABLET | Freq: Once | ORAL | Status: AC
  Administered 2012-04-10: 600 mg via ORAL
  Filled 2012-04-10 (×2): qty 1

## 2012-04-10 NOTE — ED Provider Notes (Signed)
History     CSN: [id number]  Arrival date & time 04/10/12  1430   First MD Initiated Contact with Patient 04/10/12 1703      Chief Complaint  Patient presents with  . Back Pain    (Consider location/radiation/quality/duration/timing/severity/associated sxs/prior treatment) HPI Christopher [name] is a 78 y.o. male who presents to the Emergency Department complaining of 1 week of gradually worsening, constant back pain with associated difficulty breathing.  Pt was seen one week ago and diagnosed with compression multiple compression fractures in spine.  Pt denies any trauma to back.  Pt denies associated weakness and tingling in extremities.  Pt takes oxycodone once every 12 hours with no improvement to pain.  Pt has h/o HTN.  Past Medical History  Diagnosis Date  . GERD (gastroesophageal reflux disease) 08/1996  . Hyperlipemia 08/1986  . Hypertension Pre 1996  . Osteoporosis 12/2004  . Pneumonia 22 yoa  . History of Doppler echocardiogram 05/1995    MVP, MOD AI, MILD TR, MILD MR  . Elevated PSA     decided against further eval  . Diverticulosis   . Bilateral sensorineural hearing loss 02/2012    some conductive left side  . Compression fracture     multiple    Past Surgical History  Procedure Date  . Cholecystectomy 05/1995  . Exercise treadmill 09/12/2006    NML  . Inguinal hernia repair 08/14/2010    Dr. Daphine Deutscher  . Esophagogastroduodenoscopy 08/18/2004    H. H. Dr. Russella Dar  . Colonoscopy 05/01/2007    divericulosis, int hemorrhoids - Dr. Russella Dar    Family History  Problem Relation Age of Onset  . Heart disease Mother     MI  . Hypertension Mother   . Stroke Mother     ?  . Stroke Father   . Diabetes Sister   . Hypertension Sister   . Hypertension Sister   . Hypertension Sister   . Stroke Sister     recurrent mini strokes  . Cancer Neg Hx     History  Substance Use Topics  . Smoking status: Former Games developer  . Smokeless tobacco: Former Neurosurgeon   Comment: smoked  minimal as teen  . Alcohol Use: No      Review of Systems  Musculoskeletal: Positive for back pain.  All other systems reviewed and are negative.    Allergies  Review of patient's allergies indicates no known allergies.  Home Medications   Current Outpatient Rx  Name Route Sig Dispense Refill  . ACETAMINOPHEN 500 MG PO TABS Oral Take 1,000 mg by mouth every 6 (six) hours as needed. For pain    . ASPIRIN 81 MG PO TABS Oral Take 81 mg by mouth daily.      Marland Kitchen VITAMIN D 1000 UNITS PO CAPS Oral Take 1,000 Units by mouth daily.      . EUCERIN CALMING DAILY MOIST EX CREA Apply externally Apply topically. Apply four times a day to affected area     . ESOMEPRAZOLE MAGNESIUM 40 MG PO CPDR Oral Take 40 mg by mouth daily before breakfast.    . FLAX SEED OIL 1000 MG PO CAPS Oral Take 1 capsule by mouth daily.     Marland Kitchen LISINOPRIL-HYDROCHLOROTHIAZIDE 20-25 MG PO TABS Oral Take 1 tablet by mouth daily.    Marland Kitchen LORATADINE 10 MG PO TABS Oral Take 1 tablet (10 mg total) by mouth daily. 90 tablet 3  . OXYCODONE HCL 5 MG PO TABS Oral Take 1 tablet (5  mg total) by mouth 2 (two) times daily as needed for pain. 20 tablet 0  . POTASSIUM CHLORIDE CRYS ER 20 MEQ PO TBCR Oral Take 20 mEq by mouth 2 (two) times daily.      BP 159/83  Pulse 68  Temp 98.3 F (36.8 C) (Oral)  Resp 16  SpO2 96%  Physical Exam  Nursing note and vitals reviewed. Constitutional: He is oriented to person, place, and time. He appears well-developed and well-nourished. No distress.  HENT:  Head: Normocephalic and atraumatic.  Eyes: EOM are normal.  Neck: Neck supple. No tracheal deviation present.  Cardiovascular: Normal rate.   Pulmonary/Chest: Effort normal. No respiratory distress.  Abdominal: Soft.  Musculoskeletal: Normal range of motion.  Neurological: He is alert and oriented to person, place, and time.  Skin: Skin is warm and dry.  Psychiatric: He has a normal mood and affect. His behavior is normal.    ED Course    Procedures (including critical care time) DIAGNOSTIC STUDIES: Oxygen Saturation is 96% on room air, adequate by my interpretation.    COORDINATION OF CARE: 5:17PM- Discussed treatment plan including increasing oxycodone dosage and adding ibuprofen to alleviate pain.    Labs Reviewed - No data to display No results found.   No diagnosis found.    MDM  I personally performed the services described in this documentation, which was scribed in my presence. The recorded information has been reviewed and considered.        Hilario Quarry, MD 04/11/12 [phone]

## 2012-04-10 NOTE — ED Notes (Signed)
Per pt still having back pain since hospital discharge. Complaining of 8/10 pain in middle of back.

## 2012-04-11 ENCOUNTER — Inpatient Hospital Stay: Admission: RE | Admit: 2012-04-11 | Payer: Medicare Other | Source: Ambulatory Visit

## 2012-04-11 ENCOUNTER — Telehealth: Payer: Self-pay

## 2012-04-11 NOTE — Telephone Encounter (Signed)
plz fax latest note.

## 2012-04-11 NOTE — Telephone Encounter (Signed)
Notes faxed as directed.

## 2012-04-11 NOTE — Telephone Encounter (Signed)
Christopher [name] with [facility] received rx for hospital bed; for medicare guidelines needs office notes and dx for reason for pt needing bed.Please advise.

## 2012-04-14 ENCOUNTER — Ambulatory Visit (INDEPENDENT_AMBULATORY_CARE_PROVIDER_SITE_OTHER): Payer: Medicare Other | Admitting: Family Medicine

## 2012-04-14 ENCOUNTER — Encounter: Payer: Self-pay | Admitting: Family Medicine

## 2012-04-14 VITALS — BP 128/82 | HR 82 | Temp 97.6°F | Wt 165.2 lb

## 2012-04-14 DIAGNOSIS — IMO0002 Reserved for concepts with insufficient information to code with codable children: Secondary | ICD-10-CM

## 2012-04-14 DIAGNOSIS — K5903 Drug induced constipation: Secondary | ICD-10-CM

## 2012-04-14 DIAGNOSIS — T148XXA Other injury of unspecified body region, initial encounter: Secondary | ICD-10-CM

## 2012-04-14 DIAGNOSIS — K5909 Other constipation: Secondary | ICD-10-CM

## 2012-04-14 DIAGNOSIS — M810 Age-related osteoporosis without current pathological fracture: Secondary | ICD-10-CM

## 2012-04-14 MED ORDER — ALENDRONATE SODIUM 70 MG PO TABS: 70.0000 mg | ORAL_TABLET | ORAL | Status: DC

## 2012-04-14 MED ORDER — OXYCODONE HCL 5 MG PO TABS: 5.0000 mg | ORAL_TABLET | Freq: Four times a day (QID) | ORAL | Status: DC | PRN

## 2012-04-14 NOTE — Progress Notes (Signed)
  Subjective:    Patient ID: Christopher [name], male    DOB: 1924/04/12, 78 y.o.   MRN: [id number]  HPI CC: 1wk f/u  See prior note for details.  Briefly, admitted recently for thoracic compression fracture, placed in brace.  Presented last week for f/u, concern for constipation and decreased resp drive when taking oxycodone 5mg  5x/day, so backed down to bid.  Back pain worsened so seen again Monday at ER.  Tylenol changed to ibuprofen.  Currently taking 600mg  ibuprofen Q6 hours as well as oxycodone 5mg  bid prn.  Pain continued.  Not taking tylenol - didn't really help.  Oxycodone 5mg  relieves pain for 2 hours out of every 4 hours.    Breathing better. Stools normal now, regular, soft.  OP - Has been on boniva in past, took for years.  Off for last 5 years.  Last Vit D 38 (03/2012).  Takes vit D 100 IU daily  Lab Results  Component Value Date   CREATININE 1.07 04/03/2012    Past Medical History  Diagnosis Date  . GERD (gastroesophageal reflux disease) 08/1996  . Hyperlipemia 08/1986  . Hypertension Pre 1996  . Osteoporosis 12/2004  . Pneumonia 22 yoa  . History of Doppler echocardiogram 05/1995    MVP, MOD AI, MILD TR, MILD MR  . Elevated PSA     decided against further eval  . Diverticulosis   . Bilateral sensorineural hearing loss 02/2012    some conductive left side  . Compression fracture     multiple     Review of Systems Per HPI    Objective:   Physical Exam  Nursing note and vitals reviewed. Constitutional: He appears well-developed and well-nourished. No distress.       Using walker today, brighter affect today  Cardiovascular: Normal rate, regular rhythm, normal heart sounds and intact distal pulses.   No murmur heard. Pulmonary/Chest: Effort normal and breath sounds normal. No respiratory distress. He has no wheezes. He has no rales.  Skin: Skin is warm and dry. No rash noted.  Psychiatric: He has a normal mood and affect.       Assessment & Plan:

## 2012-04-14 NOTE — Patient Instructions (Addendum)
Increase oxycodone 5mg  to 3-4 times daily as needed. Make sure you have miralax or colace available for regular stools - if constipation returning, take miralax regularly. Take fosamax weekly in the morning, stay upright after taking (no laying down) and take on empty stomach - no eating for at least to 1 hour after taking. Reschedule bone density scan up front. Return to see me in 1 month for follow up, sooner if needed.

## 2012-04-14 NOTE — Assessment & Plan Note (Signed)
Improved - knows to use miralax/colace.

## 2012-04-14 NOTE — Assessment & Plan Note (Signed)
Restart bisphosphonate - discussed use of med. Schedule dexa as none recent. Consider other treatments for osteoporosis if unable to tolerate fosamax. Lab Results  Component Value Date   CREATININE 1.07 04/03/2012

## 2012-04-14 NOTE — Assessment & Plan Note (Signed)
Will increase oxycodone to Q6-8 hours prn breakthrough pain despite ibuprofen. Start bisphosphonate. Continue brace. RTC PRN or 1 mo for f/u.

## 2012-04-15 ENCOUNTER — Encounter (HOSPITAL_COMMUNITY): Payer: Self-pay | Admitting: Emergency Medicine

## 2012-04-15 ENCOUNTER — Inpatient Hospital Stay (HOSPITAL_COMMUNITY)
Admission: EM | Admit: 2012-04-15 | Discharge: 2012-04-19 | DRG: 065 | Disposition: A | Discharge status: Rehabilitation Facility | Payer: Medicare Other | Attending: Internal Medicine | Admitting: Internal Medicine

## 2012-04-15 ENCOUNTER — Emergency Department (HOSPITAL_COMMUNITY): Payer: Medicare Other

## 2012-04-15 DIAGNOSIS — Z823 Family history of stroke: Secondary | ICD-10-CM

## 2012-04-15 DIAGNOSIS — T148XXA Other injury of unspecified body region, initial encounter: Secondary | ICD-10-CM

## 2012-04-15 DIAGNOSIS — I959 Hypotension, unspecified: Secondary | ICD-10-CM | POA: Diagnosis present

## 2012-04-15 DIAGNOSIS — I441 Atrioventricular block, second degree: Secondary | ICD-10-CM | POA: Diagnosis present

## 2012-04-15 DIAGNOSIS — R1310 Dysphagia, unspecified: Secondary | ICD-10-CM | POA: Diagnosis present

## 2012-04-15 DIAGNOSIS — G8190 Hemiplegia, unspecified affecting unspecified side: Secondary | ICD-10-CM | POA: Diagnosis present

## 2012-04-15 DIAGNOSIS — K219 Gastro-esophageal reflux disease without esophagitis: Secondary | ICD-10-CM | POA: Diagnosis present

## 2012-04-15 DIAGNOSIS — Z4789 Encounter for other orthopedic aftercare: Secondary | ICD-10-CM

## 2012-04-15 DIAGNOSIS — I712 Thoracic aortic aneurysm, without rupture, unspecified: Secondary | ICD-10-CM | POA: Diagnosis present

## 2012-04-15 DIAGNOSIS — E785 Hyperlipidemia, unspecified: Secondary | ICD-10-CM | POA: Diagnosis present

## 2012-04-15 DIAGNOSIS — R531 Weakness: Secondary | ICD-10-CM | POA: Diagnosis present

## 2012-04-15 DIAGNOSIS — R4789 Other speech disturbances: Secondary | ICD-10-CM | POA: Diagnosis present

## 2012-04-15 DIAGNOSIS — R972 Elevated prostate specific antigen [PSA]: Secondary | ICD-10-CM | POA: Diagnosis present

## 2012-04-15 DIAGNOSIS — E876 Hypokalemia: Secondary | ICD-10-CM | POA: Diagnosis present

## 2012-04-15 DIAGNOSIS — I6330 Cerebral infarction due to thrombosis of unspecified cerebral artery: Principal | ICD-10-CM | POA: Diagnosis present

## 2012-04-15 DIAGNOSIS — Z8249 Family history of ischemic heart disease and other diseases of the circulatory system: Secondary | ICD-10-CM

## 2012-04-15 DIAGNOSIS — R001 Bradycardia, unspecified: Secondary | ICD-10-CM | POA: Diagnosis present

## 2012-04-15 DIAGNOSIS — I639 Cerebral infarction, unspecified: Secondary | ICD-10-CM

## 2012-04-15 DIAGNOSIS — M6281 Muscle weakness (generalized): Secondary | ICD-10-CM

## 2012-04-15 DIAGNOSIS — M810 Age-related osteoporosis without current pathological fracture: Secondary | ICD-10-CM | POA: Diagnosis present

## 2012-04-15 DIAGNOSIS — K5900 Constipation, unspecified: Secondary | ICD-10-CM | POA: Diagnosis present

## 2012-04-15 DIAGNOSIS — I498 Other specified cardiac arrhythmias: Secondary | ICD-10-CM | POA: Diagnosis present

## 2012-04-15 DIAGNOSIS — Z87891 Personal history of nicotine dependence: Secondary | ICD-10-CM

## 2012-04-15 DIAGNOSIS — I359 Nonrheumatic aortic valve disorder, unspecified: Secondary | ICD-10-CM | POA: Diagnosis present

## 2012-04-15 DIAGNOSIS — IMO0002 Reserved for concepts with insufficient information to code with codable children: Secondary | ICD-10-CM

## 2012-04-15 DIAGNOSIS — I10 Essential (primary) hypertension: Secondary | ICD-10-CM | POA: Diagnosis present

## 2012-04-15 LAB — COMPREHENSIVE METABOLIC PANEL
AST: 9 U/L (ref 0–37)
Alkaline Phosphatase: 60 U/L (ref 39–117)
BUN: 22 mg/dL (ref 6–23)
CO2: 28 mEq/L (ref 19–32)
Chloride: 100 mEq/L (ref 96–112)
Creatinine, Ser: 1.18 mg/dL (ref 0.50–1.35)
GFR calc non Af Amer: 54 mL/min — ABNORMAL LOW (ref >90)
Potassium: 3.4 mEq/L — ABNORMAL LOW (ref 3.5–5.1)
Total Bilirubin: 0.4 mg/dL (ref 0.3–1.2)

## 2012-04-15 LAB — CBC
HCT: 39.3 % (ref 39.0–52.0)
Hemoglobin: 13.2 g/dL (ref 13.0–17.0)
MCH: 31.8 pg (ref 26.0–34.0)
MCHC: 33.6 g/dL (ref 30.0–36.0)
MCV: 94.7 fL (ref 78.0–100.0)
MCV: 94.4 fL (ref 78.0–100.0)
Platelets: 274 K/uL (ref 150–400)
Platelets: 256 10*3/uL (ref 150–400)
RBC: 4.15 MIL/uL — ABNORMAL LOW (ref 4.22–5.81)
RBC: 3.94 MIL/uL — ABNORMAL LOW (ref 4.22–5.81)
RDW: 12.9 % (ref 11.5–15.5)
RDW: 12.8 % (ref 11.5–15.5)
WBC: 7.7 10*3/uL (ref 4.0–10.5)
WBC: 7.7 10*3/uL (ref 4.0–10.5)

## 2012-04-15 LAB — GLUCOSE, CAPILLARY: Glucose-Capillary: 85 mg/dL (ref 70–99)

## 2012-04-15 LAB — COMPREHENSIVE METABOLIC PANEL WITH GFR
ALT: 10 U/L (ref 0–53)
Albumin: 3.2 g/dL — ABNORMAL LOW (ref 3.5–5.2)
Calcium: 9.4 mg/dL (ref 8.4–10.5)
GFR calc Af Amer: 63 mL/min — ABNORMAL LOW (ref >90)
Glucose, Bld: 102 mg/dL — ABNORMAL HIGH (ref 70–99)
Sodium: 137 meq/L (ref 135–145)
Total Protein: 7.3 g/dL (ref 6.0–8.3)

## 2012-04-15 LAB — DIFFERENTIAL
Basophils Absolute: 0 K/uL (ref 0.0–0.1)
Basophils Relative: 0 % (ref 0–1)
Eosinophils Absolute: 0.4 K/uL (ref 0.0–0.7)
Eosinophils Relative: 5 % (ref 0–5)
Lymphocytes Relative: 23 % (ref 12–46)
Lymphs Abs: 1.8 K/uL (ref 0.7–4.0)
Monocytes Absolute: 0.7 10*3/uL (ref 0.1–1.0)
Monocytes Relative: 9 % (ref 3–12)
Neutro Abs: 4.8 10*3/uL (ref 1.7–7.7)
Neutrophils Relative %: 62 % (ref 43–77)

## 2012-04-15 LAB — TROPONIN I: Troponin I: <0.3 ng/mL (ref <0.30)

## 2012-04-15 LAB — APTT: aPTT: 29 seconds (ref 24–37)

## 2012-04-15 LAB — CK TOTAL AND CKMB (NOT AT ARMC)
CK, MB: 1.6 ng/mL (ref 0.3–4.0)
Relative Index: INVALID (ref 0.0–2.5)
Total CK: 24 U/L (ref 7–232)

## 2012-04-15 LAB — PROTIME-INR
INR: 1.13 (ref 0.00–1.49)
Prothrombin Time: 14.7 s (ref 11.6–15.2)

## 2012-04-15 LAB — CREATININE, SERUM
Creatinine, Ser: 1.26 mg/dL (ref 0.50–1.35)
GFR calc Af Amer: 58 mL/min — ABNORMAL LOW (ref >90)
GFR calc non Af Amer: 50 mL/min — ABNORMAL LOW (ref >90)

## 2012-04-15 MED ORDER — ASPIRIN 325 MG PO TABS
325.0000 mg | ORAL_TABLET | Freq: Every day | ORAL | Status: DC
  Administered 2012-04-15: 325 mg via ORAL
  Filled 2012-04-15 (×2): qty 1

## 2012-04-15 MED ORDER — LISINOPRIL 20 MG PO TABS
20.0000 mg | ORAL_TABLET | Freq: Every day | ORAL | Status: DC
  Administered 2012-04-15: 20 mg via ORAL
  Filled 2012-04-15 (×2): qty 1

## 2012-04-15 MED ORDER — SENNOSIDES-DOCUSATE SODIUM 8.6-50 MG PO TABS
1.0000 | ORAL_TABLET | Freq: Every evening | ORAL | Status: DC | PRN
  Filled 2012-04-15: qty 1

## 2012-04-15 MED ORDER — PANTOPRAZOLE SODIUM 40 MG PO TBEC
40.0000 mg | DELAYED_RELEASE_TABLET | Freq: Every day | ORAL | Status: DC
  Administered 2012-04-16 – 2012-04-19 (×4): 40 mg via ORAL
  Filled 2012-04-15 (×4): qty 1

## 2012-04-15 MED ORDER — SODIUM CHLORIDE 0.9 % IV SOLN: INTRAVENOUS | Status: AC

## 2012-04-15 MED ORDER — LORATADINE 10 MG PO TABS
10.0000 mg | ORAL_TABLET | Freq: Every evening | ORAL | Status: DC
  Administered 2012-04-15 – 2012-04-19 (×5): 10 mg via ORAL
  Filled 2012-04-15 (×5): qty 1

## 2012-04-15 MED ORDER — HYDRALAZINE HCL 20 MG/ML IJ SOLN
10.0000 mg | INTRAMUSCULAR | Status: DC | PRN
  Filled 2012-04-15: qty 0.5

## 2012-04-15 MED ORDER — FENTANYL CITRATE 0.05 MG/ML IJ SOLN
12.5000 ug | INTRAMUSCULAR | Status: DC | PRN
  Administered 2012-04-15 – 2012-04-16 (×5): 12.5 ug via INTRAVENOUS
  Filled 2012-04-15 (×5): qty 2

## 2012-04-15 MED ORDER — OXYCODONE HCL 5 MG PO TABS
5.0000 mg | ORAL_TABLET | ORAL | Status: DC | PRN
Start: 2012-04-15 — End: 2012-04-19
  Administered 2012-04-16 – 2012-04-19 (×12): 5 mg via ORAL
  Filled 2012-04-15 (×13): qty 1

## 2012-04-15 MED ORDER — ENOXAPARIN SODIUM 40 MG/0.4ML ~~LOC~~ SOLN
40.0000 mg | SUBCUTANEOUS | Status: DC
  Administered 2012-04-15 – 2012-04-18 (×4): 40 mg via SUBCUTANEOUS
  Filled 2012-04-15 (×5): qty 0.4

## 2012-04-15 MED ORDER — ASPIRIN 300 MG RE SUPP
300.0000 mg | Freq: Every day | RECTAL | Status: DC
  Filled 2012-04-15 (×2): qty 1

## 2012-04-15 NOTE — ED Notes (Signed)
EKG performed at 1742 and given to Dr. Oletta Lamas along with OLD ekg.

## 2012-04-15 NOTE — ED Provider Notes (Signed)
History     CSN: [id number]  Arrival date & time 04/15/12  1732   First MD Initiated Contact with Patient 04/15/12 1746      Chief Complaint  Patient presents with  . Weakness    (Consider location/radiation/quality/duration/timing/severity/associated sxs/prior treatment) HPI Comments: Pt reports didn't sleep well last night, had general weakness this AM, but in particular had a moment where he couldn't lift his right arm up from lap, also speech was slurred and pt reports briefly couldn't speak at all.  At noon, family was with pt, was moving extremities better, they do not report slurred speech until later this afternoon which prompted them bringing to the ED.  Pt recently was hospitalized after compression fracture to back and requiring pain control, was doing better up until yesterday.  Pt denies HA, no CP, SOB, palpitations.  Denies numbness.  May have had a stroke or TIA, inconclusive years ago.  No definite CVA that pt or family knows of.  Pt lives alone.    Patient is a 78 y.o. male presenting with weakness. The history is provided by the patient, a relative and medical records.  Weakness Primary symptoms do not include headaches, fever, nausea or vomiting.  Additional symptoms include weakness.    Past Medical History  Diagnosis Date  . GERD (gastroesophageal reflux disease) 08/1996  . Hyperlipemia 08/1986  . Hypertension Pre 1996  . Osteoporosis 12/2004  . Pneumonia 22 yoa  . History of Doppler echocardiogram 05/1995    MVP, MOD AI, MILD TR, MILD MR  . Elevated PSA     decided against further eval  . Diverticulosis   . Bilateral sensorineural hearing loss 02/2012    some conductive left side  . Compression fracture     multiple    Past Surgical History  Procedure Date  . Cholecystectomy 05/1995  . Exercise treadmill 09/12/2006    NML  . Inguinal hernia repair 08/14/2010    Dr. Daphine Deutscher  . Esophagogastroduodenoscopy 08/18/2004    [facility]. H. Dr. Russella Dar  . Colonoscopy  05/01/2007    divericulosis, int hemorrhoids - Dr. Russella Dar    Family History  Problem Relation Age of Onset  . Heart disease Mother     MI  . Hypertension Mother   . Stroke Mother     ?  . Stroke Father   . Diabetes Sister   . Hypertension Sister   . Hypertension Sister   . Hypertension Sister   . Stroke Sister     recurrent mini strokes  . Cancer Neg Hx     History  Substance Use Topics  . Smoking status: Former Games developer  . Smokeless tobacco: Former Neurosurgeon   Comment: smoked minimal as teen  . Alcohol Use: No      Review of Systems  Constitutional: Negative for fever and chills.  Respiratory: Negative for cough and shortness of breath.   Cardiovascular: Negative for chest pain.  Gastrointestinal: Negative for nausea and vomiting.  Musculoskeletal: Positive for back pain.  Neurological: Positive for speech difficulty and weakness. Negative for light-headedness and headaches.  All other systems reviewed and are negative.    Allergies  Review of patient's allergies indicates no known allergies.  Home Medications   Current Outpatient Rx  Name Route Sig Dispense Refill  . ACETAMINOPHEN 500 MG PO TABS Oral Take 1,000 mg by mouth every 6 (six) hours as needed. For pain    . ALENDRONATE SODIUM 70 MG PO TABS Oral Take 70 mg by mouth every  7 (seven) days. Take with a full glass of water on an empty stomach.    . ASPIRIN EC 81 MG PO TBEC Oral Take 81 mg by mouth daily.    Marland Kitchen VITAMIN D 1000 UNITS PO CAPS Oral Take 1,000 Units by mouth every evening.     . EUCERIN CALMING DAILY MOIST EX CREA Apply externally Apply topically. Apply four times a day to affected area     . ESOMEPRAZOLE MAGNESIUM 40 MG PO CPDR Oral Take 40 mg by mouth daily before breakfast.    . FLAX SEED OIL 1000 MG PO CAPS Oral Take 1 capsule by mouth every evening.     . IBUPROFEN 200 MG PO TABS Oral Take 200 mg by mouth every 6 (six) hours as needed. For pain.    Marland Kitchen LISINOPRIL-HYDROCHLOROTHIAZIDE 20-25 MG PO TABS  Oral Take 1 tablet by mouth daily.    Marland Kitchen LORATADINE 10 MG PO TABS Oral Take 10 mg by mouth every evening.     . OXYCODONE HCL 5 MG PO TABS Oral Take 5 mg by mouth every 4 (four) hours as needed. For pain.    Marland Kitchen POTASSIUM CHLORIDE CRYS ER 20 MEQ PO TBCR Oral Take 20 mEq by mouth 2 (two) times daily.      BP 120/77  Pulse 51  Temp 98.1 F (36.7 C) (Oral)  Resp 12  SpO2 97%  Physical Exam  Nursing note and vitals reviewed. Constitutional: He appears well-developed and well-nourished.  HENT:  Head: Normocephalic and atraumatic.  Eyes: Pupils are equal, round, and reactive to light.  Neck: Normal range of motion. Neck supple.  Cardiovascular: Regular rhythm.   No murmur heard. Pulmonary/Chest: Effort normal.  Abdominal: Soft. He exhibits no distension. There is no tenderness.  Musculoskeletal: He exhibits no edema and no tenderness.  Neurological: He is alert. He displays no atrophy and no tremor. A cranial nerve deficit is present. He exhibits normal muscle tone. Coordination abnormal.       Arm drift on right, 4/5 strength to right leg  Skin: Skin is warm. No rash noted. No erythema.  Psychiatric: He has a normal mood and affect.    ED Course  Procedures (including critical care time)  Labs Reviewed  CBC - Abnormal; Notable for the following:    RBC 4.15 (*)     All other components within normal limits  COMPREHENSIVE METABOLIC PANEL - Abnormal; Notable for the following:    Potassium 3.4 (*)     Glucose, Bld 102 (*)     Albumin 3.2 (*)     GFR calc non Af Amer 54 (*)     GFR calc Af Amer 63 (*)     All other components within normal limits  PROTIME-INR  APTT  DIFFERENTIAL  CK TOTAL AND CKMB  TROPONIN I  GLUCOSE, CAPILLARY   Ct Head Wo Contrast  04/15/2012  *RADIOLOGY REPORT*  Clinical Data: 78 year old male with weakness.  CT HEAD WITHOUT CONTRAST  Technique:  Contiguous axial images were obtained from the base of the skull through the vertex without contrast.   Comparison: Head CT of brain MRI 04/16/2010.  Findings: Left maxillary sinus mucous retention cyst.  Other Visualized paranasal sinuses and mastoids are clear.  No acute osseous abnormality identified.  Visualized orbits and scalp soft tissues are within normal limits.  Calcified atherosclerosis at the skull base.  Widespread chronic lacunar infarcts in the deep gray matter nuclei.  Similar appearance on the prior; by MRI some of  these are dilated perivascular spaces.  No ventriculomegaly. No midline shift, mass effect, or evidence of mass lesion.  No acute intracranial hemorrhage identified.   Scattered other cerebral white matter hypodensity also in part related to perivascular spaces. No evidence of cortically based acute infarction identified.  No suspicious intracranial vascular hyperdensity.  IMPRESSION: No acute intracranial abnormality.  Chronic small vessel disease and widespread dilated perivascular spaces.   Original Report Authenticated By: Harley Hallmark, M.D.    I reviewed head CT myself and agree with radiologist interpretation.  1. Stroke     ECG at time 17:42 shows sinus rhythm, first degree AV block, normal axis,  No ST abn's, borderline flat T waves lateral leads.  Similar to ECG from 04/02/12.    MDM  Pt with stroke like symptoms, began at around 0700.  Weakness persists now, so cannot call a TIA.  Head CT is unremarkable for acute per radiologist.  Dr. Roseanne Reno with [facility], will consult, I spoke to Dr. Toniann Fail who will admit to [facility], team 10, tele bed.          Gavin Pound. Oletta Lamas, MD 04/15/12 2006

## 2012-04-15 NOTE — Consult Note (Signed)
Referring Physician: Dr. Oletta Lamas    Chief Complaint: Slurred speech and right-sided weakness.  HPI: Christopher [name] is an 78 y.o. male with a history of hypertension and hyperlipidemia as well as a history of chronic back pain associated with vertebral fractures, presenting with new-onset slurred speech and weakness involving his right arm and leg. He was last known normal at about 4 AM today. He noticed difficulty moving his right arm and right leg when he woke up at about 7 AM. Strength improved. Family members noted however that his face was asymmetrical with speech was slurred. He also had inability to walk, including with use of his walker. CT scan showed no acute intracranial abnormality. Patient has been on aspirin 81 mg per day. Is no previous history stroke nor TIA. NIH stroke score was 4.  LSN: 4 AM today tPA Given: No: Beyond time window for treatment consideration MRankin: 3, based on pre-existing difficulty with ambulation and current exacerbation.  Past Medical History  Diagnosis Date  . GERD (gastroesophageal reflux disease) 08/1996  . Hyperlipemia 08/1986  . Hypertension Pre 1996  . Osteoporosis 12/2004  . Pneumonia 22 yoa  . History of Doppler echocardiogram 05/1995    MVP, MOD AI, MILD TR, MILD MR  . Elevated PSA     decided against further eval  . Diverticulosis   . Bilateral sensorineural hearing loss 02/2012    some conductive left side  . Compression fracture     multiple    Family History  Problem Relation Age of Onset  . Heart disease Mother     MI  . Hypertension Mother   . Stroke Mother     ?  . Stroke Father   . Diabetes Sister   . Hypertension Sister   . Hypertension Sister   . Hypertension Sister   . Stroke Sister     recurrent mini strokes  . Cancer Neg Hx      Medications:  Prior to Admission:  Tylenol 1000 mg every 6 hours when necessary Fosamax 70 mg per week Aspirin 81 mg per day Vitamin D 1000 units per day Eucerin cream 4 times a day  to affected area Nexium 40 mg per day Flaxseed oil one capsule per day Ibuprofen 200 mg every 6 hours for pain when necessary Lisinopril/hydrochlorothiazide 20-25 one per day Claritin 10 mg per day Oxycodone 5 mg every 4 hours when necessary for pain Potassium chloride 20 mEq twice a day  Physical Examination: Blood pressure 120/77, pulse 51, temperature 98.1 F (36.7 C), temperature source Oral, resp. rate 12, SpO2 97.00%.  Neurologic Examination: Mental Status: Alert, oriented, thought content appropriate.  Speech moderately slurred without evidence of aphasia. Able to follow commands without difficulty. Cranial Nerves: II-Visual fields were normal. III/IV/VI-Pupils were equal and reacted. Extraocular movements were full and conjugate.    V/VII-no facial numbness; slight right lower facial weakness. VIII-normal. X-moderate dysarthria; symmetrical palatal movement. Motor: Minimal drift of right upper and lower extremities against gravity, otherwise normal motor exam. Sensory: Normal throughout. Deep Tendon Reflexes: 2+ and symmetric. Plantars: Mute on the right and flexor on the left Cerebellar: Normal finger-to-nose testing. Carotid auscultation: Normal   Ct Head Wo Contrast  04/15/2012  *RADIOLOGY REPORT*  Clinical Data: 78 year old male with weakness.  CT HEAD WITHOUT CONTRAST  Technique:  Contiguous axial images were obtained from the base of the skull through the vertex without contrast.  Comparison: Head CT of brain MRI 04/16/2010.  Findings: Left maxillary sinus mucous retention cyst.  Other Visualized paranasal sinuses and mastoids are clear.  No acute osseous abnormality identified.  Visualized orbits and scalp soft tissues are within normal limits.  Calcified atherosclerosis at the skull base.  Widespread chronic lacunar infarcts in the deep gray matter nuclei.  Similar appearance on the prior; by MRI some of these are dilated perivascular spaces.  No ventriculomegaly. No  midline shift, mass effect, or evidence of mass lesion.  No acute intracranial hemorrhage identified.   Scattered other cerebral white matter hypodensity also in part related to perivascular spaces. No evidence of cortically based acute infarction identified.  No suspicious intracranial vascular hyperdensity.  IMPRESSION: No acute intracranial abnormality.  Chronic small vessel disease and widespread dilated perivascular spaces.   Original Report Authenticated By: Harley Hallmark, M.D.     Assessment: 78 y.o. male probable acute small left subcortical ischemic infarction.  Stroke Risk Factors - hyperlipidemia and hypertension  Plan: 1. HgbA1c, fasting lipid panel 2. MRI, MRA  of the brain without contrast 3. PT consult, OT consult, Speech consult 4. Echocardiogram 5. Carotid dopplers 6. Prophylactic therapy-Antiplatelet med: Plavix 75 mg per day 7. Risk factor modification 8. Telemetry monitoring  C.R. Roseanne Reno, MD [facility] [phone]  04/15/2012, 8:36 PM

## 2012-04-15 NOTE — H&P (Signed)
Christopher [name] is an 78 y.o. male.  Patient was seen and examined on April 15, 2012 at 8:25 PM. PCP - Dr. Buena Irish [facility].  Chief Complaint: Right-sided weakness. HPI: 78 year old male who was just recently discharged after being treated for T11 compression fracture with history of hypertension elevated PSA started noticing weakness in the right upper and lower extremity when he woke up today at around 7 AM. This weakness persisted and later noticed slurred speech and also right facial droop. Since the symptoms persisted he called his family around 7 PM and EMS was called patient was brought to the ER. CT head does not show any acute. Patient at this time is admitted for further workup for possible CVA. Patient is not a candidate for TPA because patient is out of the window. Neurologist has already evaluated the patient. In addition patient has been having constant back pain from his recent T11 fracture. Otherwise denies any chest pain shortness of breath. Still has slurred speech and also noticed some difficulty swallowing his pills earlier today. Denies any visual symptoms.  Past Medical History  Diagnosis Date  . GERD (gastroesophageal reflux disease) 08/1996  . Hyperlipemia 08/1986  . Hypertension Pre 1996  . Osteoporosis 12/2004  . Pneumonia 22 yoa  . History of Doppler echocardiogram 05/1995    MVP, MOD AI, MILD TR, MILD MR  . Elevated PSA     decided against further eval  . Diverticulosis   . Bilateral sensorineural hearing loss 02/2012    some conductive left side  . Compression fracture     multiple    Past Surgical History  Procedure Date  . Cholecystectomy 05/1995  . Exercise treadmill 09/12/2006    NML  . Inguinal hernia repair 08/14/2010    Dr. Daphine Deutscher  . Esophagogastroduodenoscopy 08/18/2004    H. H. Dr. Russella Dar  . Colonoscopy 05/01/2007    divericulosis, int hemorrhoids - Dr. Russella Dar    Family History  Problem Relation Age of Onset  . Heart disease Mother    MI  . Hypertension Mother   . Stroke Mother     ?  . Stroke Father   . Diabetes Sister   . Hypertension Sister   . Hypertension Sister   . Hypertension Sister   . Stroke Sister     recurrent mini strokes  . Cancer Neg Hx    Social History:  reports that he has quit smoking. He has quit using smokeless tobacco. He reports that he does not drink alcohol or use illicit drugs.  Allergies: No Known Allergies   (Not in a hospital admission)  Results for orders placed during the hospital encounter of 04/15/12 (from the past 48 hour(s))  PROTIME-INR     Status: Normal   Collection Time   04/15/12  6:14 PM      Component Value Range Comment   Prothrombin Time 14.7  11.6 - 15.2 seconds    INR 1.13  0.00 - 1.49   APTT     Status: Normal   Collection Time   04/15/12  6:14 PM      Component Value Range Comment   aPTT 29  24 - 37 seconds   CBC     Status: Abnormal   Collection Time   04/15/12  6:14 PM      Component Value Range Comment   WBC 7.7  4.0 - 10.5 K/uL    RBC 4.15 (*) 4.22 - 5.81 MIL/uL    Hemoglobin 13.2  13.0 -  17.0 g/dL    HCT 40.9  81.1 - 91.4 %    MCV 94.7  78.0 - 100.0 fL    MCH 31.8  26.0 - 34.0 pg    MCHC 33.6  30.0 - 36.0 g/dL    RDW 78.2  95.6 - 21.3 %    Platelets 274  150 - 400 K/uL   DIFFERENTIAL     Status: Normal   Collection Time   04/15/12  6:14 PM      Component Value Range Comment   Neutrophils Relative 62  43 - 77 %    Neutro Abs 4.8  1.7 - 7.7 K/uL    Lymphocytes Relative 23  12 - 46 %    Lymphs Abs 1.8  0.7 - 4.0 K/uL    Monocytes Relative 9  3 - 12 %    Monocytes Absolute 0.7  0.1 - 1.0 K/uL    Eosinophils Relative 5  0 - 5 %    Eosinophils Absolute 0.4  0.0 - 0.7 K/uL    Basophils Relative 0  0 - 1 %    Basophils Absolute 0.0  0.0 - 0.1 K/uL   COMPREHENSIVE METABOLIC PANEL     Status: Abnormal   Collection Time   04/15/12  6:14 PM      Component Value Range Comment   Sodium 137  135 - 145 mEq/L    Potassium 3.4 (*) 3.5 - 5.1 mEq/L    Chloride  100  96 - 112 mEq/L    CO2 28  19 - 32 mEq/L    Glucose, Bld 102 (*) 70 - 99 mg/dL    BUN 22  6 - 23 mg/dL    Creatinine, Ser 0.86  0.50 - 1.35 mg/dL    Calcium 9.4  8.4 - 57.8 mg/dL    Total Protein 7.3  6.0 - 8.3 g/dL    Albumin 3.2 (*) 3.5 - 5.2 g/dL    AST 9  0 - 37 U/L    ALT 10  0 - 53 U/L    Alkaline Phosphatase 60  39 - 117 U/L    Total Bilirubin 0.4  0.3 - 1.2 mg/dL    GFR calc non Af Amer 54 (*) >90 mL/min    GFR calc Af Amer 63 (*) >90 mL/min   CK TOTAL AND CKMB     Status: Normal   Collection Time   04/15/12  6:15 PM      Component Value Range Comment   Total CK 24  7 - 232 U/L    CK, MB 1.6  0.3 - 4.0 ng/mL    Relative Index RELATIVE INDEX IS INVALID  0.0 - 2.5   TROPONIN I     Status: Normal   Collection Time   04/15/12  6:15 PM      Component Value Range Comment   Troponin I <0.30  <0.30 ng/mL   GLUCOSE, CAPILLARY     Status: Normal   Collection Time   04/15/12  6:42 PM      Component Value Range Comment   Glucose-Capillary 85  70 - 99 mg/dL    Comment 1 Notify RN      Ct Head Wo Contrast  04/15/2012  *RADIOLOGY REPORT*  Clinical Data: 78 year old male with weakness.  CT HEAD WITHOUT CONTRAST  Technique:  Contiguous axial images were obtained from the base of the skull through the vertex without contrast.  Comparison: Head CT of brain MRI 04/16/2010.  Findings: Left  maxillary sinus mucous retention cyst.  Other Visualized paranasal sinuses and mastoids are clear.  No acute osseous abnormality identified.  Visualized orbits and scalp soft tissues are within normal limits.  Calcified atherosclerosis at the skull base.  Widespread chronic lacunar infarcts in the deep gray matter nuclei.  Similar appearance on the prior; by MRI some of these are dilated perivascular spaces.  No ventriculomegaly. No midline shift, mass effect, or evidence of mass lesion.  No acute intracranial hemorrhage identified.   Scattered other cerebral white matter hypodensity also in part related to  perivascular spaces. No evidence of cortically based acute infarction identified.  No suspicious intracranial vascular hyperdensity.  IMPRESSION: No acute intracranial abnormality.  Chronic small vessel disease and widespread dilated perivascular spaces.   Original Report Authenticated By: Harley Hallmark, M.D.     Review of Systems  Constitutional: Negative.   HENT: Negative.   Eyes: Negative.   Respiratory: Negative.   Cardiovascular: Negative.   Gastrointestinal: Negative.   Genitourinary: Negative.   Musculoskeletal: Positive for back pain.  Skin: Negative.   Neurological: Positive for speech change. Focal weakness: right facial right upper and lower extremities.  Endo/Heme/Allergies: Negative.   Psychiatric/Behavioral: Negative.     Blood pressure 124/74, pulse 55, temperature 98.1 F (36.7 C), temperature source Oral, resp. rate 22, SpO2 97.00%. Physical Exam  Constitutional: He is oriented to person, place, and time. He appears well-developed and well-nourished. No distress.  HENT:  Head: Normocephalic and atraumatic.  Right Ear: External ear normal.  Left Ear: External ear normal.  Nose: Nose normal.  Mouth/Throat: Oropharynx is clear and moist. No oropharyngeal exudate.  Eyes: Conjunctivae are normal. Pupils are equal, round, and reactive to light. Right eye exhibits no discharge. Left eye exhibits no discharge. No scleral icterus.  Neck: Normal range of motion. Neck supple.  Cardiovascular:       Sinus bradycardia.  Respiratory: Effort normal and breath sounds normal. No respiratory distress. He has no wheezes. He has no rales.  GI: Soft. Bowel sounds are normal. He exhibits no distension. There is no tenderness. There is no rebound.  Musculoskeletal: He exhibits no edema and no tenderness.  Neurological: He is alert and oriented to person, place, and time.       Mild weakness in right upper and lower extremities. Tongue is midline. No obvious facial asymmetry.  Skin:  Skin is warm and dry. No rash noted. He is not diaphoretic. No erythema.  Psychiatric: His behavior is normal.     Assessment/Plan #1. Right-sided weakness with slurred speech concerning for stroke - patient will be placed on neurochecks swallow evaluation get MRI/MRA brain 2-D echo and carotid Doppler. Aspirin. #2. Sinus bradycardia - patient is not on any rate limiting medications. Continue to monitor on telemetry. Check EKG. Check TSH. May be related to his pain/pain medications. #3. Hypertension - continue lisinopril. Hold off HCTZ for now and gently hydrate. #4. Recent admission for T11 compression fracture - continue pain medications. I have written also small dose of fentanyl for breakthrough pain. #5. Recent admission hypercalcemia was noted which has resolved. Continue to monitor metabolic panel. #6. Elevated PSA.   CODE STATUS - full code.   Eduard Clos 04/15/2012, 8:49 PM

## 2012-04-15 NOTE — ED Notes (Signed)
Patient transported to X-ray 

## 2012-04-15 NOTE — ED Notes (Addendum)
Pt brought to ED by EMS with generalized weakness.

## 2012-04-15 NOTE — ED Notes (Signed)
Neuro at bedside.

## 2012-04-16 ENCOUNTER — Inpatient Hospital Stay (HOSPITAL_COMMUNITY): Payer: Medicare Other

## 2012-04-16 DIAGNOSIS — I319 Disease of pericardium, unspecified: Secondary | ICD-10-CM

## 2012-04-16 DIAGNOSIS — I6350 Cerebral infarction due to unspecified occlusion or stenosis of unspecified cerebral artery: Secondary | ICD-10-CM

## 2012-04-16 DIAGNOSIS — E876 Hypokalemia: Secondary | ICD-10-CM | POA: Diagnosis present

## 2012-04-16 LAB — CBC WITH DIFFERENTIAL/PLATELET
Basophils Absolute: 0 10*3/uL (ref 0.0–0.1)
Eosinophils Relative: 5 % (ref 0–5)
HCT: 38 % — ABNORMAL LOW (ref 39.0–52.0)
Lymphocytes Relative: 28 % (ref 12–46)
Lymphs Abs: 1.8 10*3/uL (ref 0.7–4.0)
MCV: 94.1 fL (ref 78.0–100.0)
Monocytes Absolute: 0.5 10*3/uL (ref 0.1–1.0)
Neutro Abs: 3.9 10*3/uL (ref 1.7–7.7)
Platelets: 246 10*3/uL (ref 150–400)
RBC: 4.04 MIL/uL — ABNORMAL LOW (ref 4.22–5.81)
RDW: 12.9 % (ref 11.5–15.5)
WBC: 6.7 10*3/uL (ref 4.0–10.5)

## 2012-04-16 LAB — COMPREHENSIVE METABOLIC PANEL
Albumin: 3 g/dL — ABNORMAL LOW (ref 3.5–5.2)
BUN: 20 mg/dL (ref 6–23)
Creatinine, Ser: 1.05 mg/dL (ref 0.50–1.35)
GFR calc Af Amer: 73 mL/min — ABNORMAL LOW (ref >90)
Glucose, Bld: 89 mg/dL (ref 70–99)
Total Protein: 6.7 g/dL (ref 6.0–8.3)

## 2012-04-16 LAB — HEMOGLOBIN A1C: Mean Plasma Glucose: 120 mg/dL — ABNORMAL HIGH (ref <117)

## 2012-04-16 LAB — LIPID PANEL: Cholesterol: 131 mg/dL (ref 0–200)

## 2012-04-16 LAB — TSH: TSH: 0.527 u[IU]/mL (ref 0.350–4.500)

## 2012-04-16 MED ORDER — IOHEXOL 350 MG/ML SOLN
50.0000 mL | Freq: Once | INTRAVENOUS | Status: AC | PRN
  Administered 2012-04-16: 50 mL via INTRAVENOUS

## 2012-04-16 MED ORDER — CLOPIDOGREL BISULFATE 75 MG PO TABS
75.0000 mg | ORAL_TABLET | Freq: Every day | ORAL | Status: DC
  Administered 2012-04-16 – 2012-04-19 (×4): 75 mg via ORAL
  Filled 2012-04-16 (×5): qty 1

## 2012-04-16 MED ORDER — METHOCARBAMOL 500 MG PO TABS
500.0000 mg | ORAL_TABLET | Freq: Three times a day (TID) | ORAL | Status: DC | PRN
  Administered 2012-04-16: 500 mg via ORAL
  Filled 2012-04-16 (×2): qty 1

## 2012-04-16 MED ORDER — MORPHINE SULFATE 2 MG/ML IJ SOLN
1.0000 mg | Freq: Once | INTRAMUSCULAR | Status: AC | PRN
  Administered 2012-04-16: 1 mg via INTRAVENOUS
  Filled 2012-04-16: qty 1

## 2012-04-16 MED ORDER — POTASSIUM CHLORIDE CRYS ER 20 MEQ PO TBCR
40.0000 meq | EXTENDED_RELEASE_TABLET | Freq: Once | ORAL | Status: AC
  Administered 2012-04-16: 40 meq via ORAL
  Filled 2012-04-16: qty 2

## 2012-04-16 MED ORDER — SODIUM CHLORIDE 0.9 % IV BOLUS (SEPSIS)
500.0000 mL | Freq: Once | INTRAVENOUS | Status: AC
  Administered 2012-04-16: 500 mL via INTRAVENOUS

## 2012-04-16 MED ORDER — DOPAMINE-DEXTROSE 3.2-5 MG/ML-% IV SOLN
2.0000 ug/kg/min | INTRAVENOUS | Status: DC
  Administered 2012-04-16: 2 ug/kg/min via INTRAVENOUS
  Filled 2012-04-16 (×2): qty 250

## 2012-04-16 NOTE — Consult Note (Signed)
Name: Christopher [name] MRN: [id number] DOB: [birth year]    LOS: 1  PULMONARY / CRITICAL CARE MEDICINE  HPI:   78 year old male with PMH relevant for HTN, dyslipidemia, moderate aortic insufficiency per echo, GERD and compression fractures of the spine. Recently discharged from the hospital with a T11 compression fracture. Admitted on 9/7 with right hemiparesis. CT scan of the head ruled out acute bleed. Not a candidate for TPA because he was out of window. At the time of my exam the patient is awake. Alert and hemodynamically stable. Earlier today the patient experience hypotension and PCCM consult was called for possible pressors in the neuro ICU. Antihypertensive medications were discontinued.   Past Medical History  Diagnosis Date  . GERD (gastroesophageal reflux disease) 08/1996  . Hyperlipemia 08/1986  . Hypertension Pre 1996  . Osteoporosis 12/2004  . Pneumonia 22 yoa  . History of Doppler echocardiogram 05/1995    MVP, MOD AI, MILD TR, MILD MR  . Elevated PSA     decided against further eval  . Diverticulosis   . Bilateral sensorineural hearing loss 02/2012    some conductive left side  . Compression fracture     multiple   Past Surgical History  Procedure Date  . Cholecystectomy 05/1995  . Exercise treadmill 09/12/2006    NML  . Inguinal hernia repair 08/14/2010    Dr. Daphine Deutscher  . Esophagogastroduodenoscopy 08/18/2004    H. H. Dr. Russella Dar  . Colonoscopy 05/01/2007    divericulosis, int hemorrhoids - Dr. Russella Dar   Prior to Admission medications   Medication Sig Start Date End Date Taking? Authorizing Provider  acetaminophen (TYLENOL) 500 MG tablet Take 1,000 mg by mouth every 6 (six) hours as needed. For pain   Yes Historical Provider, MD  alendronate (FOSAMAX) 70 MG tablet Take 70 mg by mouth every 7 (seven) days. Take with a full glass of water on an empty stomach.   Yes Historical Provider, MD  aspirin EC 81 MG tablet Take 81 mg by mouth daily.   Yes Historical Provider, MD    Cholecalciferol (VITAMIN D) 1000 UNITS capsule Take 1,000 Units by mouth every evening.    Yes Historical Provider, MD  Emollient (EUCERIN CALMING DAILY MOIST) CREA Apply topically. Apply four times a day to affected area    Yes Historical Provider, MD  esomeprazole (NEXIUM) 40 MG capsule Take 40 mg by mouth daily before breakfast.   Yes Historical Provider, MD  Flaxseed, Linseed, (FLAX SEED OIL) 1000 MG CAPS Take 1 capsule by mouth every evening.    Yes Historical Provider, MD  ibuprofen (ADVIL,MOTRIN) 200 MG tablet Take 200 mg by mouth every 6 (six) hours as needed. For pain.   Yes Historical Provider, MD  lisinopril-hydrochlorothiazide (PRINZIDE,ZESTORETIC) 20-25 MG per tablet Take 1 tablet by mouth daily.   Yes Historical Provider, MD  loratadine (CLARITIN) 10 MG tablet Take 10 mg by mouth every evening.    Yes Historical Provider, MD  oxyCODONE (OXY IR/ROXICODONE) 5 MG immediate release tablet Take 5 mg by mouth every 4 (four) hours as needed. For pain.   Yes Historical Provider, MD  potassium chloride SA (K-DUR,KLOR-CON) 20 MEQ tablet Take 20 mEq by mouth 2 (two) times daily.   Yes Historical Provider, MD   Allergies No Known Allergies  Family History Family History  Problem Relation Age of Onset  . Heart disease Mother     MI  . Hypertension Mother   . Stroke Mother     ?  Marland Kitchen  Stroke Father   . Diabetes Sister   . Hypertension Sister   . Hypertension Sister   . Hypertension Sister   . Stroke Sister     recurrent mini strokes  . Cancer Neg Hx    Social History  reports that he has quit smoking. He has quit using smokeless tobacco. He reports that he does not drink alcohol or use illicit drugs.  Review Of Systems:  Patient complains of back pain. Rest of systems negative except for what I mentioned in the HPI..   Vital Signs: Temp:  [97.3 F (36.3 C)-99 F (37.2 C)] 99 F (37.2 C) (09/08 1914) Pulse Rate:  [49-64] 64  (09/08 2115) Resp:  [11-22] 13  (09/08 2115) BP:  (115-149)/(60-81) 115/72 mmHg (09/08 2115) SpO2:  [93 %-100 %] 96 % (09/08 2115) Weight:  [165 lb 2 oz (74.9 kg)] 165 lb 2 oz (74.9 kg) (09/07 2246)  Physical Examination: General:  Pleasant male patient in no acute distress Neuro:  Awake, alert, oriented x 3. Right hemiparesis. HEENT:  PERRL, pink conjunctivae, moist membranes Neck:  Supple, no JVD   Cardiovascular:  Systolic/ diastolic murmur throughout the precordium. Lungs:  CTA Abdomen:  Soft, nontender, nondistended, bowel sounds present Musculoskeletal:  Moves all extremities, no pedal edema Skin:  No rash   Lab 04/15/12 2118 04/15/12 1815  TROPONINI <0.30 <0.30  LATICACIDVEN -- --  PROBNP -- --    Lab 04/16/12 0605 04/15/12 2118 04/15/12 1814  NA 139 -- 137  K 3.3* -- 3.4*  CL 100 -- 100  CO2 29 -- 28  BUN 20 -- 22  CREATININE 1.05 1.26 1.18  CALCIUM 9.3 -- 9.4  MG -- -- --  PHOS -- -- --   Intake/Output      09/08 0701 - 09/09 0700   Urine (mL/kg/hr) 490 (0.4)   Total Output 490   Net -490         Lab 04/16/12 0605 04/15/12 1814  AST 10 9  ALT 8 10  ALKPHOS 57 60  BILITOT 0.5 0.4  PROT 6.7 7.3  ALBUMIN 3.0* 3.2*     Lab 04/16/12 0605 04/15/12 2118 04/15/12 1814  HGB 12.8* 12.7* 13.2  HCT 38.0* 37.2* 39.3  PLT 246 256 274  INR -- -- 1.13  APTT -- -- 29    Lab 04/16/12 0605 04/15/12 2118 04/15/12 1814  WBC 6.7 7.7 7.7  PROCALCITON -- -- --    Lab 04/15/12 1842  GLUCAP 85    Principal Problem:  *Right sided weakness Active Problems:  HYPERLIPIDEMIA  HYPERTENSION  Compression fracture  Bradycardia  Hypokalemia   ASSESSMENT AND PLAN:   78 years old male with PMH relevant for HTN, dyslipidemia, moderate aortic insufficiency per echo, GERD and compression fractures of the spine. Presents with right sided hemiparesis secondary to ischemic CVA. Experienced transient hypotension in the setting of antihypertensive drugs. Also found to have a Mobitz II AV block. The patient was transferred  to the [facility] ICU and PCCM consult called to assist with pressors management if needed. At the time of my exam the patient is awake, alert, oriented x 3 with systolic BP of 140 (at goal) with no pressors.   1) Hypotension improved - Will start pressors if persistent hypotension or bradycardia - For now BP at goal. - May try small fluid boluses before starting pressors.  - Stroke and medical management per primary team.  The patient is critically ill with multiple organ systems failure and requires  high complexity decision making for assessment and support, frequent evaluation and titration of therapies, application of advanced monitoring technologies and extensive interpretation of multiple databases.   Critical Care Time devoted to patient care services described in this note is: 80 Minutes  Overton Mam, M.D. Pulmonary and Critical Care Medicine [facility] Pager: [phone]  04/16/2012, 9:45 PM

## 2012-04-16 NOTE — Procedures (Signed)
Objective Swallowing Evaluation: Modified Barium Swallowing Study  Patient Details  Name: Christopher [name] MRN: [id number] Date of Birth: 01-Apr-1924  Today's Date: 04/16/2012 Time: 1230-1300 SLP Time Calculation (min): 30 min  Past Medical History:  Past Medical History  Diagnosis Date  . GERD (gastroesophageal reflux disease) 08/1996  . Hyperlipemia 08/1986  . Hypertension Pre 1996  . Osteoporosis 12/2004  . Pneumonia 22 yoa  . History of Doppler echocardiogram 05/1995    MVP, MOD AI, MILD TR, MILD MR  . Elevated PSA     decided against further eval  . Diverticulosis   . Bilateral sensorineural hearing loss 02/2012    some conductive left side  . Compression fracture     multiple   Past Surgical History:  Past Surgical History  Procedure Date  . Cholecystectomy 05/1995  . Exercise treadmill 09/12/2006    NML  . Inguinal hernia repair 08/14/2010    Dr. Daphine Deutscher  . Esophagogastroduodenoscopy 08/18/2004    H. H. Dr. Russella Dar  . Colonoscopy 05/01/2007    divericulosis, int hemorrhoids - Dr. Russella Dar   HPI:  78 y/o male admitted to ED with stroke like symptoms. CT scan unremarkable.  Results of MRI pending.  No prior history of dysphagia.  MBS indicated following results of initial BSE completed this date.      Assessment / Plan / Recommendation Clinical Impression  Dysphagia Diagnosis: Moderate oral phase dysphagia;Moderate pharyngeal phase dysphagia;Mild cervical esophageal phase dysphagia Clinical impression: Moderate sensory-motor oral dysphagia mainly due to decreased lingual strength, sensation ,and coordination.  Difficulty initiating lingual movement with subsequent tongue pumping noted with all PO trials preventing bolus leaving oral cavity.     Moderate sensory paired with mechanical pharyngeal dysphagia. TBR decreased but when finally initiated large amount of thin liquid barium dumped in pharynx.   Significant aspiraton with good cough response before the swallow occurred with  thin liquid by spoon due to decreased sensation and decreased laryngeal closure.   Sluggish velopharyngeal lmovement noted.  C-6  appeared to have large osteophyte anteriorly encroaching posterior oropharynx and esophagus slowing bolus transit.( no radiologist present to confirm)   Backflow noted with nectar thick liquids after the swallow.  Recommend to proceed with conservative diet of dysphagia 1 (puree) and Nectar thick liquids due to noted decreased sensory in pharyngeal phase decreasing airway closure.  Recommend full supervision with all meals as aspiration risk remains.  ST to follow for diet tolerance and possible diet upgrade. Recommend repeat MBS in 3 days with clinical improvement.  Recommend CIR consult.     Treatment Recommendation    2x weekly for 2 weeks   Diet Recommendation Dysphagia 1 (Puree);Nectar-thick liquid   Liquid Administration via: Cup;No straw Medication Administration: Crushed with puree Supervision: Full supervision/cueing for compensatory strategies;Patient able to self feed Compensations: Slow rate;Small sips/bites;Clear throat intermittently;Multiple dry swallows after each bite/sip Postural Changes and/or Swallow Maneuvers: Seated upright 90 degrees;Upright 30-60 min after meal    Other  Recommendations  Oral Care Recommendations: Oral care QID Other Recommendations: Order thickener from pharmacy;Prohibited food (jello, ice cream, thin soups);Remove water pitcher;Clarify dietary restrictions   Follow Up Recommendations  Inpatient Rehab    Frequency and Duration min 2x/week  2 weeks       SLP Swallow Goals Patient will consume recommended diet without observed clinical signs of aspiration with: Modified independent assistance Patient will utilize recommended strategies during swallow to increase swallowing safety with: Modified independent assistance   General Date of Onset: 04/14/12 HPI: 78  y/o male admitted to ED with stroke like symptoms. CT scan  unremarkable.  Results of MRI pending.  No prior history of dysphagia.  MBS indicated following results of initial BSE completed this date.  Type of Study: Modified Barium Swallowing Study Reason for Referral: Objectively evaluate swallowing function Previous Swallow Assessment: Family denies previous history of dysphagia  Diet Prior to this Study: NPO Temperature Spikes Noted: No Respiratory Status: Room air History of Recent Intubation: No Behavior/Cognition: Alert;Cooperative;Pleasant mood Oral Cavity - Dentition: Missing dentition Oral Motor / Sensory Function: Impaired - see Bedside swallow eval Self-Feeding Abilities: Able to feed self;Needs assist Patient Positioning: Upright in chair Baseline Vocal Quality: Low vocal intensity;Hoarse Volitional Cough: Strong Volitional Swallow: Able to elicit Anatomy: Within functional limits Pharyngeal Secretions: Not observed secondary MBS    Reason for Referral Objectively evaluate swallowing function   Oral Phase   Oral phase: Impaired  Pharyngeal Phase Pharyngeal Phase: Impaired   Cervical Esophageal Phase      Christopher Fowler MS, CCC-SLP [phone] Cervical Esophageal Phase: Impaired    [facility] [place] 04/16/2012, 4:55 PM

## 2012-04-16 NOTE — Evaluation (Signed)
Clinical/Bedside Swallow Evaluation Patient Details  Name: Christopher [name] MRN: [id number] Date of Birth: [birth year]  Today's Date: 04/16/2012 Time: 1100-1130 SLP Time Calculation (min): 30 min  Past Medical History:  Past Medical History  Diagnosis Date  . GERD (gastroesophageal reflux disease) 08/1996  . Hyperlipemia 08/1986  . Hypertension Pre 1996  . Osteoporosis 12/2004  . Pneumonia 22 yoa  . History of Doppler echocardiogram 05/1995    MVP, MOD AI, MILD TR, MILD MR  . Elevated PSA     decided against further eval  . Diverticulosis   . Bilateral sensorineural hearing loss 02/2012    some conductive left side  . Compression fracture     multiple   Past Surgical History:  Past Surgical History  Procedure Date  . Cholecystectomy 05/1995  . Exercise treadmill 09/12/2006    NML  . Inguinal hernia repair 08/14/2010    Dr. Daphine Deutscher  . Esophagogastroduodenoscopy 08/18/2004    H. H. Dr. Russella Dar  . Colonoscopy 05/01/2007    divericulosis, int hemorrhoids - Dr. Russella Dar   HPI:  Christopher [name] is an 78 y.o. male with a history of hypertension and hyperlipidemia as well as a history of chronic back pain associated with vertebral fractures, presenting with new-onset slurred speech and weakness involving his right arm and leg. He was last known normal at about 4 AM today. He noticed difficulty moving his right arm and right leg when he woke up at about 7 AM. Strength improved. Family members noted however that his face was asymmetrical with speech was slurred. He also had inability to walk, including with use of his walker. CT scan showed no acute intracranial abnormality. MRI pending.  Patient has been on aspirin 81 mg per day. Is no previous history stroke nor TIA. NIH stroke score was 4. No prior history of dysphagia.   Patient passed RN Swallow Screen but due to concerns with aspiration BSE ordered.   Patient describes dysphagia as difficulty "moving the food back to swallow it"   Assessment /  Plan / Recommendation Clinical Impression  Moderate oral sensory-motor dysphagia marked by decreased lingual strength and coordination decreasing anterior to posterior transit and initiation of the swallow.  Moderate sensory pharyngeal dysphagia with +s/s of aspiration before and after swallow of thin water by spoon and cup due to delay in initiation and decreased airway closure.  Recommend NPO status and proceed with MBS for objective information regarding risk for aspiration and to recommend safest, possible  PO diet    Aspiration Risk  Severe    Diet Recommendation NPO        Other  Recommendations Recommended Consults: MBS   Follow Up Recommendations       Frequency and Duration min 2x/week  2 weeks       SLP Swallow Goals  pending results of MBS   Swallow Study Prior Functional Status   Lived at home alone with intermittent help from son. No prior reports of dysphagia     General Date of Onset: 04/14/12 HPI: Christopher [name] is an 78 y.o. male with a history of hypertension and hyperlipidemia as well as a history of chronic back pain associated with vertebral fractures, presenting with new-onset slurred speech and weakness involving his right arm and leg. He was last known normal at about 4 AM today. He noticed difficulty moving his right arm and right leg when he woke up at about 7 AM. Strength improved. Family members noted however that his face was  asymmetrical with speech was slurred. He also had inability to walk, including with use of his walker. CT scan showed no acute intracranial abnormality. Patient has been on aspirin 81 mg per day. Is no previous history stroke nor TIA. NIH stroke score was 4. Type of Study: Bedside swallow evaluation Previous Swallow Assessment: Family denies previous history of dysphagia  Diet Prior to this Study: NPO Temperature Spikes Noted: Yes Respiratory Status: Room air History of Recent Intubation: No Behavior/Cognition:  Alert;Cooperative;Pleasant mood Oral Cavity - Dentition: Missing dentition Self-Feeding Abilities: Able to feed self Patient Positioning: Upright in bed Baseline Vocal Quality: Low vocal intensity;Clear;Other (comment) (dysarthric) Volitional Cough: Strong Volitional Swallow: Able to elicit    Oral/Motor/Sensory Function Overall Oral Motor/Sensory Function: Impaired Labial ROM: Reduced right Labial Symmetry: Abnormal symmetry right Labial Strength: Reduced Labial Sensation: Reduced Lingual ROM: Reduced right Lingual Symmetry: Abnormal symmetry right Lingual Strength: Reduced Lingual Sensation: Reduced Facial ROM: Reduced right Facial Symmetry: Right droop Facial Strength: Reduced Facial Sensation: Reduced   Ice Chips Ice chips: Not tested   Thin Liquid Thin Liquid: Impaired Presentation: Spoon Pharyngeal  Phase Impairments: Suspected delayed Swallow;Decreased hyoid-laryngeal movement;Unable to trigger swallow;Wet Vocal Quality;Cough - Immediate    Nectar Thick Nectar Thick Liquid: Impaired Presentation: Spoon Oral Phase Impairments: Reduced lingual movement/coordination;Impaired anterior to posterior transit;Poor awareness of bolus Oral phase functional implications: Prolonged oral transit;Oral holding Pharyngeal Phase Impairments: Suspected delayed Swallow;Decreased hyoid-laryngeal movement   Honey Thick Honey Thick Liquid: Not tested   Puree Puree: Impaired Presentation: Spoon Oral Phase Impairments: Impaired anterior to posterior transit;Poor awareness of bolus Oral Phase Functional Implications: Prolonged oral transit;Oral residue Pharyngeal Phase Impairments: Suspected delayed Swallow;Multiple swallows;Decreased hyoid-laryngeal movement   Solid   GO    Solid: Not tested      Moreen Fowler MS,CCC-SLP [phone] [facility] 04/16/2012,4:11 PM

## 2012-04-16 NOTE — Progress Notes (Signed)
Occupational Therapy Evaluation Patient Details Name: Christopher [name] MRN: [id number] DOB: 05/21/1924 Today's Date: 04/16/2012 Time: 4098-1191 OT Time Calculation (min): 30 min  OT Assessment / Plan / Recommendation Clinical Impression  Pt admitted with right sided weakness and slurred speech and s/p medulla stroke. Pt with recent admission of T11 compression fx. Will benefit from acute OT services to address below problem list. Reommending CIR to maximize independence with ADLs before d/c home.    OT Assessment  Patient needs continued OT Services    Follow Up Recommendations  Inpatient Rehab    Barriers to Discharge Decreased caregiver support    Equipment Recommendations  Defer to next venue    Recommendations for Other Services Rehab consult  Frequency  Min 3X/week    Precautions / Restrictions Precautions Precautions: Fall;Back Precaution Comments: pt able to recall 3/3 back precautions. Minimal cueing for safety during transfer Restrictions Weight Bearing Restrictions: No   Pertinent Vitals/Pain See vitals    ADL  Upper Body Dressing: Performed;Set up Where Assessed - Upper Body Dressing: Unsupported sitting Lower Body Dressing: Performed;+1 Total assistance Where Assessed - Lower Body Dressing: Unsupported sitting Toilet Transfer: Simulated;+2 Total assistance Toilet Transfer: Patient Percentage: 60% Toilet Transfer Method: Stand pivot Acupuncturist:  (chair) Equipment Used: Rolling walker;Gait belt Transfers/Ambulation Related to ADLs: +2 assist ADL Comments: Pt has back brace (described as corset by pt) at home. Asked family member to bring in back brace.     OT Diagnosis: Generalized weakness;Cognitive deficits;Acute pain  OT Problem List: Decreased strength;Decreased activity tolerance;Impaired balance (sitting and/or standing);Decreased cognition;Decreased knowledge of use of DME or AE;Pain OT Treatment Interventions: Self-care/ADL  training;Neuromuscular education;Therapeutic activities;Cognitive remediation/compensation;Patient/family education;Balance training;DME and/or AE instruction   OT Goals Acute Rehab OT Goals OT Goal Formulation: With patient Time For Goal Achievement: 04/23/12 Potential to Achieve Goals: Good ADL Goals Pt Will Perform Grooming: with modified independence;Standing at sink ADL Goal: Grooming - Progress: Goal set today Pt Will Perform Lower Body Bathing: with modified independence;Sit to stand from chair;Sit to stand from bed;with adaptive equipment ADL Goal: Lower Body Bathing - Progress: Goal set today Pt Will Perform Lower Body Dressing: with modified independence;Sit to stand from chair;Sit to stand from bed;with adaptive equipment ADL Goal: Lower Body Dressing - Progress: Goal set today Pt Will Transfer to Toilet: with modified independence;Ambulation;with DME;3-in-1;Maintaining back safety precautions ADL Goal: Toilet Transfer - Progress: Goal set today  Visit Information  Last OT Received On: 04/16/12 Assistance Needed: +2    Subjective Data      Prior Functioning  Vision/Perception  Home Living Lives With: Alone Available Help at Discharge: Family (unsure of level of assist available) Type of Home: House Home Access: Level entry Home Layout: One level Bathroom Shower/Tub: Tub/shower unit;Curtain Firefighter: Standard Home Adaptive Equipment: Walker - rolling;Bedside commode/3-in-1 Prior Function Level of Independence: Needs assistance Needs Assistance: Dressing Dressing: Moderate Vocation: Retired Comments: Independent prior to recent T11 compression fx. Since fx, pt has had family member stop by intermittently during day to assist with ADLs. Has been sponge bathing since T11 fx. Communication Communication: Expressive difficulties;HOH Dominant Hand: Right      Cognition  Overall Cognitive Status: Impaired Area of Impairment:  Attention;Safety/judgement Arousal/Alertness: Awake/alert Orientation Level: Appears intact for tasks assessed Behavior During Session: [facility] for tasks performed Current Attention Level: Sustained Attention - Other Comments: difficulty with maintaining attention for >10 seconds on one task Safety/Judgement: Decreased awareness of safety precautions;Decreased safety judgement for tasks assessed    Extremity/Trunk Assessment  Right Upper Extremity Assessment RUE ROM/Strength/Tone: Deficits RUE ROM/Strength/Tone Deficits: 3+/5 throughout RUE Sensation: WFL - Light Touch;WFL - Proprioception Left Upper Extremity Assessment LUE ROM/Strength/Tone: Within functional levels Right Lower Extremity Assessment RLE ROM/Strength/Tone: Deficits RLE ROM/Strength/Tone Deficits: grossly 3+-4/5 RLE Sensation: WFL - Light Touch Left Lower Extremity Assessment LLE ROM/Strength/Tone: Within functional levels LLE Sensation: WFL - Light Touch   Mobility  Shoulder Instructions  Bed Mobility Bed Mobility: Rolling Left;Left Sidelying to Sit;Sitting - Scoot to [place] of Bed Rolling Left: 4: Min assist Left Sidelying to Sit: 3: Mod assist;With rails Details for Bed Mobility Assistance: VC for proper sequencing to maintain back precautions during transfer. Increased assistance through trunk and pelvis into sitting from sidelying Transfers Sit to Stand: 1: +2 Total assist;With upper extremity assist;From bed Sit to Stand: Patient Percentage: 70% Stand to Sit: 1: +2 Total assist;With upper extremity assist;To chair/3-in-1 Stand to Sit: Patient Percentage: 70% Details for Transfer Assistance: VC for hand placement for a safe transfer to/from RW. Increased assistance to maintain control of RLE as minimal buckling upon standing       Exercise     Balance     End of Session OT - End of Session Equipment Utilized During Treatment: Gait belt Activity Tolerance: Patient tolerated treatment well Patient left: in  chair;with call bell/phone within reach;with family/visitor present Nurse Communication: Mobility status  GO   04/16/2012 Cipriano Mile OTR/L Pager [phone] Office [phone]   Cipriano Mile 04/16/2012, 6:21 PM

## 2012-04-16 NOTE — Progress Notes (Signed)
History: Christopher [name] is an 78 y.o. male with a history of hypertension and hyperlipidemia as well as a history of chronic back pain associated with vertebral fractures, presenting with new-onset slurred speech and weakness involving his right arm and leg. He was last known normal at about 4 AM today. He noticed difficulty moving his right arm and right leg when he woke up at about 7 AM. Strength improved. Family members noted however that his face was asymmetrical with speech was slurred. He also had inability to walk, including with use of his walker. CT scan showed no acute intracranial abnormality. Patient has been on aspirin 81 mg per day. Is no previous history stroke nor TIA. NIH stroke score was 4.  Subjective: I think I'm ok.  Still weaker on the right side.  Objective: BP 130/60  Pulse 57  Temp 97.8 F (36.6 C) (Oral)  Resp 19  Ht 5\' 11"  (1.803 m)  Wt 74.9 kg (165 lb 2 oz)  BMI 23.03 kg/m2  SpO2 97%  CBGs  Basename 04/15/12 1842  GLUCAP 85    Medications: Scheduled:   . sodium chloride   Intravenous STAT  . aspirin  300 mg Rectal Daily   Or  . aspirin  325 mg Oral Daily  . enoxaparin  40 mg Subcutaneous Q24H  . lisinopril  20 mg Oral Daily  . loratadine  10 mg Oral QPM  . pantoprazole  40 mg Oral Daily    Neurologic Exam: Mental Status: Alert, oriented, thought content appropriate.  Speech fluent without evidence of aphasia. No significant dysarthria. Able to follow 3 step commands without difficulty. Cranial Nerves: II- Visual fields grossly intact. III/IV/VI-Extraocular movements intact.  Pupils reactive bilaterally. V/VII-slight right lower facial droop. VIII-hearing grossly intact IX/X-uvula deviated to the right XI-bilateral shoulder shrug intact XII-midline tongue extension Motor: 5/5 LUE/LLE, 4+/% RUE/RLE.normal tone and bulk, mild right pronator drift. Sensory: Light touch intact throughout, bilaterally Deep Tendon Reflexes: 2+ and symmetric  throughout Plantars: upgoing on right, downgoing on left. Cerebellar: Normal finger-to-nose  Lab Results: CBC:  Lab 04/16/12 0605 04/15/12 2118 04/15/12 1814  WBC 6.7 7.7 --  NEUTROABS 3.9 -- 4.8  HGB 12.8* 12.7* --  HCT 38.0* 37.2* --  MCV 94.1 94.4 --  PLT 246 256 --   Basic Metabolic Panel:  Lab 04/16/12 7846 04/15/12 2118 04/15/12 1814  NA 139 -- 137  K 3.3* -- 3.4*  CL 100 -- 100  CO2 29 -- 28  GLUCOSE 89 -- 102*  BUN 20 -- 22  CREATININE 1.05 1.26 --  CALCIUM 9.3 -- 9.4  MG -- -- --  PHOS -- -- --   Liver Function Tests:  Lab 04/16/12 0605 04/15/12 1814  AST 10 9  ALT 8 10  ALKPHOS 57 60  BILITOT 0.5 0.4  PROT 6.7 7.3  ALBUMIN 3.0* 3.2*   Fasting Lipid Panel:  Lab 04/16/12 0605  CHOL 131  HDL 31*  LDLCALC 80  TRIG 962  CHOLHDL 4.2  LDLDIRECT --   Coagulation:  Lab 04/15/12 1814  LABPROT 14.7  INR 1.13    Study Results:  04/16/2012  CHEST - 2 VIEW Findings: There is a retrocardiac opacity on the left, opacities in the left base may represent areas of atelectasis and/or consolidation, with superimposed small left-sided pleural effusion. Large hiatal hernia.  Right lung is clear.  No evidence of pulmonary edema.  Moderate cardiomegaly is unchanged.  Mediastinal contours are slightly distorted by patient rotation to the right  and lordotic positioning, but similar to priors.  Atherosclerosis in the thoracic aorta.  IMPRESSION: 1.  Atelectasis and/or consolidation left lower lobe with small left-sided pleural effusion. 2.  Large hiatal hernia again noted. 3.  Moderate cardiomegaly, unchanged. 4.  Atherosclerosis.  Florencia Reasons, M.D.    04/15/2012   CT HEAD WITHOUT CONTRAST   Findings: Left maxillary sinus mucous retention cyst.  Other Visualized paranasal sinuses and mastoids are clear.  No acute osseous abnormality identified.  Visualized orbits and scalp soft tissues are within normal limits.  Calcified atherosclerosis at the skull base.  Widespread  chronic lacunar infarcts in the deep gray matter nuclei.  Similar appearance on the prior; by MRI some of these are dilated perivascular spaces.  No ventriculomegaly. No midline shift, mass effect, or evidence of mass lesion.  No acute intracranial hemorrhage identified.   Scattered other cerebral white matter hypodensity also in part related to perivascular spaces. No evidence of cortically based acute infarction identified.  No suspicious intracranial vascular hyperdensity.  IMPRESSION: No acute intracranial abnormality.  Chronic small vessel disease and widespread dilated perivascular spaces.    H.LEE HALL III, M.D.    Assessment: 78 yo male with right hemiparesis.  Head CT negative, MRI pending.  Stroke work up underway.  Was on ASA 81 mg as outpatient.  Now on Plavix 75 mg daily for secondary stroke prevention.    LDL 80  Stroke risk factors: HTN, HLD  Plan: 1. HgbA1c 2. MRI, MRA of the brain without contrast  3. PT consult, OT consult, Speech consult  4. Echocardiogram  5. Carotid dopplers  6. Prophylactic therapy-Antiplatelet med: Plavix 75 mg per day  7. Risk factor modification  8. Telemetry monitoring 9. K+ low- to be repleted by primary team.   LOS: 1 day   Marya Fossa PA-C [facility] [phone] 04/16/2012  9:36 AM  Lesly Dukes

## 2012-04-16 NOTE — Progress Notes (Signed)
Telemetry: 4 missed beats, approximately 1.56 second each in duration.  Patient asymptomatic. MD notified.  Will continue to monitor.  Osvaldo Angst, RN----------

## 2012-04-16 NOTE — Progress Notes (Signed)
Patient arrived from RD in extreme pain (10+/10 per patient report) resulting from his recent T11 compression fracture.  He was given no pain medication in the ED and per family, did not take his 1600 dose of Oxy IR at home because he was told by EMS not to take it before they transported him.  Giving pain medication and providing emotional support to family and patient.  Will continue to monitor and provide pain mgmt per orders.

## 2012-04-16 NOTE — Evaluation (Signed)
Physical Therapy Evaluation Patient Details Name: Christopher [name] MRN: [id number] DOB: Oct 23, 1923 Today's Date: 04/16/2012 Time: 8295-6213 PT Time Calculation (min): 30 min  PT Assessment / Plan / Recommendation Clinical Impression  Pt with recent discharge from T11 compression fx with recent medulla CVA. Pt with right sided weakness and slurred speech. Discussed with pt and family the need for additional rehab prior to d/c for safety. Pt will benefit from skilled PT in the acute care setting in order to maximize functional mobility, increase strength and safety prior to d/c    PT Assessment  Patient needs continued PT services    Follow Up Recommendations  Inpatient Rehab    Barriers to Discharge Decreased caregiver support son works during the day. daughter only able to check in a couple times a week. son does come over daily though    Equipment Recommendations  None recommended by PT    Recommendations for Other Services Rehab consult   Frequency Min 4X/week    Precautions / Restrictions Precautions Precautions: Fall;Back Precaution Comments: pt able to recall 3/3 back precautions. Minimal cueing for safety during transfer Restrictions Weight Bearing Restrictions: No   Pertinent Vitals/Pain Back pain during transfer: 6/10      Mobility  Bed Mobility Bed Mobility: Rolling Left;Left Sidelying to Sit;Sitting - Scoot to [place] of Bed Rolling Left: 4: Min assist Left Sidelying to Sit: 3: Mod assist;With rails Details for Bed Mobility Assistance: VC for proper sequencing to maintain back precautions during transfer. Increased assistance through trunk and pelvis into sitting from sidelying Transfers Transfers: Sit to Stand;Stand to Sit Sit to Stand: 1: +2 Total assist;With upper extremity assist;From bed Sit to Stand: Patient Percentage: 70% Stand to Sit: 1: +2 Total assist;With upper extremity assist;To chair/3-in-1 Stand to Sit: Patient Percentage: 70% Details for Transfer  Assistance: VC for hand placement for a safe transfer to/from RW. Increased assistance to maintain control of RLE as minimal buckling upon standing Ambulation/Gait Ambulation/Gait Assistance: 1: +2 Total assist Ambulation/Gait: Patient Percentage: 60% Ambulation Distance (Feet): 5 Feet Assistive device: Rolling walker Ambulation/Gait Assistance Details: Pt ambulated short distance from bed to chair. Assistance of RLE for initiation of hip flexion as well as control of knee during stance phase. Pt with decreased dorsiflexion, although when given cues was able to increase DF and control knee better although still required +2 for assistance.  Gait Pattern: Trunk flexed;Decreased dorsiflexion - right;Decreased stance time - right;Decreased step length - left;Step-to pattern;Right flexed knee in stance Stairs: No Modified Rankin (Stroke Patients Only) Pre-Morbid Rankin Score: Moderate disability Modified Rankin: Severe disability    Exercises     PT Diagnosis: Difficulty walking;Hemiplegia dominant side;Abnormality of gait;Acute pain  PT Problem List: Decreased activity tolerance;Decreased balance;Decreased mobility;Decreased safety awareness;Decreased knowledge of use of DME;Decreased strength;Decreased knowledge of precautions;Pain PT Treatment Interventions:     PT Goals Acute Rehab PT Goals PT Goal Formulation: With patient Time For Goal Achievement: 04/30/12 Potential to Achieve Goals: Good Pt will go Supine/Side to Sit: with modified independence;with HOB 0 degrees PT Goal: Supine/Side to Sit - Progress: Goal set today Pt will go Sit to Supine/Side: with modified independence;with HOB 0 degrees PT Goal: Sit to Supine/Side - Progress: Goal set today Pt will go Sit to Stand: with supervision PT Goal: Sit to Stand - Progress: Goal set today Pt will go Stand to Sit: with supervision PT Goal: Stand to Sit - Progress: Goal set today Pt will Transfer Bed to Chair/Chair to Bed: with min  assist PT  Transfer Goal: Bed to Chair/Chair to Bed - Progress: Goal set today Pt will Ambulate: 51 - 150 feet;with min assist;with least restrictive assistive device PT Goal: Ambulate - Progress: Goal set today  Visit Information  Last PT Received On: 04/16/12 Assistance Needed: +2 PT/OT Co-Evaluation/Treatment: Yes    Subjective Data  Patient Stated Goal: to go home, although safely   Prior Functioning  Home Living Lives With: Alone Available Help at Discharge: Family (unsure of level of assist available) Type of Home: House Home Access: Level entry Home Layout: One level Bathroom Shower/Tub: Tub/shower unit;Curtain Firefighter: Standard Home Adaptive Equipment: Walker - rolling;Bedside commode/3-in-1 Prior Function Level of Independence: Needs assistance Needs Assistance: Dressing Dressing: Moderate Vocation: Retired Musician: Expressive difficulties;HOH Dominant Hand: Right    Cognition  Overall Cognitive Status: Impaired Area of Impairment: Attention;Safety/judgement Arousal/Alertness: Awake/alert Orientation Level: Appears intact for tasks assessed Behavior During Session: [facility] for tasks performed Current Attention Level: Sustained Attention - Other Comments: difficulty with maintaining attention for >10 seconds on one task Safety/Judgement: Decreased awareness of safety precautions;Decreased safety judgement for tasks assessed    Extremity/Trunk Assessment Right Lower Extremity Assessment RLE ROM/Strength/Tone: Deficits RLE ROM/Strength/Tone Deficits: grossly 3+-4/5 RLE Sensation: WFL - Light Touch Left Lower Extremity Assessment LLE ROM/Strength/Tone: Within functional levels LLE Sensation: WFL - Light Touch   Balance    End of Session PT - End of Session Equipment Utilized During Treatment: Gait belt Activity Tolerance: Patient tolerated treatment well Patient left: in chair;with call bell/phone within reach Nurse Communication:  Mobility status    Milana Kidney 04/16/2012, 6:14 PM  04/16/2012 Milana Kidney DPT PAGER: [phone] OFFICE: [phone]

## 2012-04-16 NOTE — Progress Notes (Addendum)
Subjective: Reports that he is doing OK. Weak of the side side. Anxious about the MRI.  Objective: Vital signs in last 24 hours: Filed Vitals:   04/16/12 0200 04/16/12 0400 04/16/12 0600 04/16/12 0908  BP: 135/81 149/64 142/75 130/60  Pulse: 51 50 49 57  Temp: 97.6 F (36.4 C) 97.3 F (36.3 C) 97.8 F (36.6 C) 97.8 F (36.6 C)  TempSrc: Oral Oral Oral Oral  Resp: 20 18 20 19   Height:      Weight:      SpO2: 100% 97% 93% 97%   Weight change:   Intake/Output Summary (Last 24 hours) at 04/16/12 1024 Last data filed at 04/16/12 0910  Gross per 24 hour  Intake      0 ml  Output    300 ml  Net   -300 ml    Physical Exam: General: Awake, Oriented, No acute distress. HEENT: EOMI, Droopy right lip. Neck: Supple CV: S1 and S2 Lungs: Clear to ascultation bilaterally Abdomen: Soft, Nontender, Nondistended, +bowel sounds. Ext: Good pulses. Trace edema. Neuro: 3/5 motor strength in right upper and lower extremity. 5/5 motor strength in LLE.  Lab Results: Basic Metabolic Panel:  Lab 04/16/12 0981 04/15/12 2118 04/15/12 1814  NA 139 -- 137  K 3.3* -- 3.4*  CL 100 -- 100  CO2 29 -- 28  GLUCOSE 89 -- 102*  BUN 20 -- 22  CREATININE 1.05 1.26 1.18  CALCIUM 9.3 -- 9.4  MG -- -- --  PHOS -- -- --   Liver Function Tests:  Lab 04/16/12 0605 04/15/12 1814  AST 10 9  ALT 8 10  ALKPHOS 57 60  BILITOT 0.5 0.4  PROT 6.7 7.3  ALBUMIN 3.0* 3.2*   No results found for this basename: LIPASE:5,AMYLASE:5 in the last 168 hours No results found for this basename: AMMONIA:5 in the last 168 hours CBC:  Lab 04/16/12 0605 04/15/12 2118 04/15/12 1814  WBC 6.7 7.7 7.7  NEUTROABS 3.9 -- 4.8  HGB 12.8* 12.7* 13.2  HCT 38.0* 37.2* 39.3  MCV 94.1 94.4 94.7  PLT 246 256 274   Cardiac Enzymes:  Lab 04/15/12 2118 04/15/12 1815  CKTOTAL -- 24  CKMB -- 1.6  CKMBINDEX -- --  TROPONINI <0.30 <0.30   BNP (last 3 results) No results found for this basename: PROBNP:3 in the last 8760  hours CBG:  Lab 04/15/12 1842  GLUCAP 85   No results found for this basename: HGBA1C:5 in the last 72 hours Other Labs: No components found with this basename: POCBNP:3 No results found for this basename: DDIMER:2 in the last 168 hours  Lab 04/16/12 0605  CHOL 131  HDL 31*  LDLCALC 80  TRIG 191  CHOLHDL 4.2  LDLDIRECT --   No results found for this basename: TSH,T4TOTAL,FREET3,T3FREE,FREET4,THYROIDAB in the last 168 hours No results found for this basename: VITAMINB12:2,FOLATE:2,FERRITIN:2,TIBC:2,IRON:2,RETICCTPCT:2 in the last 168 hours  Micro Results: No results found for this or any previous visit (from the past 240 hour(s)).  Studies/Results: Dg Chest 2 View  04/16/2012  *RADIOLOGY REPORT*  Clinical Data: Stroke.  Recent T11 fracture.  Back pain.  CHEST - 2 VIEW  Comparison: Chest x-ray 04/02/2012.  Findings: There is a retrocardiac opacity on the left, opacities in the left base may represent areas of atelectasis and/or consolidation, with superimposed small left-sided pleural effusion. Large hiatal hernia.  Right lung is clear.  No evidence of pulmonary edema.  Moderate cardiomegaly is unchanged.  Mediastinal contours are slightly distorted by patient  rotation to the right and lordotic positioning, but similar to priors.  Atherosclerosis in the thoracic aorta.  IMPRESSION: 1.  Atelectasis and/or consolidation left lower lobe with small left-sided pleural effusion. 2.  Large hiatal hernia again noted. 3.  Moderate cardiomegaly, unchanged. 4.  Atherosclerosis.   Original Report Authenticated By: Florencia Reasons, M.D.    Ct Head Wo Contrast  04/15/2012  *RADIOLOGY REPORT*  Clinical Data: 78 year old male with weakness.  CT HEAD WITHOUT CONTRAST  Technique:  Contiguous axial images were obtained from the base of the skull through the vertex without contrast.  Comparison: Head CT of brain MRI 04/16/2010.  Findings: Left maxillary sinus mucous retention cyst.  Other Visualized  paranasal sinuses and mastoids are clear.  No acute osseous abnormality identified.  Visualized orbits and scalp soft tissues are within normal limits.  Calcified atherosclerosis at the skull base.  Widespread chronic lacunar infarcts in the deep gray matter nuclei.  Similar appearance on the prior; by MRI some of these are dilated perivascular spaces.  No ventriculomegaly. No midline shift, mass effect, or evidence of mass lesion.  No acute intracranial hemorrhage identified.   Scattered other cerebral white matter hypodensity also in part related to perivascular spaces. No evidence of cortically based acute infarction identified.  No suspicious intracranial vascular hyperdensity.  IMPRESSION: No acute intracranial abnormality.  Chronic small vessel disease and widespread dilated perivascular spaces.   Original Report Authenticated By: Harley Hallmark, M.D.     Medications: I have reviewed the patient's current medications. Scheduled Meds:   . sodium chloride   Intravenous STAT  . aspirin  300 mg Rectal Daily   Or  . aspirin  325 mg Oral Daily  . enoxaparin  40 mg Subcutaneous Q24H  . lisinopril  20 mg Oral Daily  . loratadine  10 mg Oral QPM  . pantoprazole  40 mg Oral Daily   Continuous Infusions:   . sodium chloride     PRN Meds:.fentaNYL, hydrALAZINE, methocarbamol, morphine injection, oxyCODONE, senna-docusate  Assessment/Plan: Right-sided weakness with slurred speech concerning for stroke MRI/MRA, 2D ECHO, Carotid dopplers pending. PT/OT pending.  Transition Aspirin to plavix. Appreciate neurology evaluation. Continue telemetry. Hemoglobin A1c pending. LDL 80.   Sinus bradycardia Stable. Continue to monitor on telemetry. Check TSH pending. May be related to his pain/pain medications.   Hypertension Stable, continue lisinopril. Hold off HCTZ for now and gently hydrate.   Recent admission for T11 compression fracture  Continue pain medications. Continue small dose of fentanyl for  breakthrough pain.   Hypokalemia Replace as needed.  Recent admission hypercalcemia Resolved.   Elevated PSA Outpatient management.  Prophylaxis Lovenox  Code status Full code.   LOS: 1 day  Marley Pakula A, MD 04/16/2012, 10:24 AM  Addendum: Was called by the nurse because the patient had 4 missed beats at various intervals, telemetry strips reviewed, patient has prolonged PR with drop QRS.  Suggestive of Mobitz II. Findings discussed with Dr. Johney Frame, Cardiology, who agreed with the assessment.  Given patient's age and his stroke, did not recommend any further management at this time.  Recommended dopamine to help with conduction if he is going to be started on pressors. Dr. Johney Frame stated that someone from [facility] will see the patient in the morning as consultation.  If however the patient has multiple dropped beats or has any other cardiac issues [facility] will be available for consultation during the night.  Discussed with Dr. Amada Jupiter, given the patient received lisinopril, Dr. Amada Jupiter recommended  having the patient be transferred to the neuro ICU for frequent neuro checks as well as Neo-Synephrine versus other pressors to keep his systolic blood pressure greater than 140, given high-grade mid basilar artery stenosis.  As the patient will be going to neuro ICU, Dr. Amada Jupiter stated that he will discuss with critical care to determine who will assume patient's care.  Chelle Cayton A, MD 04/16/2012, 5:29 PM

## 2012-04-16 NOTE — Progress Notes (Signed)
  Echocardiogram 2D Echocardiogram has been performed.  Christopher [name] Christopher [name] 04/16/2012, 10:46 AM

## 2012-04-16 NOTE — Progress Notes (Addendum)
Was called earlier regarding a tight basilar stenosis. He received a dose of lisinopril last night, and his blood pressures have been low all day ranging from 100 systolic to 140s systolic.  He feels that he is getting better, however I'm quite concerned with his low blood pressures in the setting of his stenosis and recent ischemic infarct. Given that he received a dose of blood pressure medicine and may not be able to auto press as needed I would be very concerned that he may extend his infarct tonight while he is asleep and therefore will want to watch him somewhere that we can do more frequent neurochecks and support his BP.   1) I have discontinued lisinopril 2) will transfer him to the neuro ICU for frequent neuro checks as well as pressors to keep his blood pressure > 140 systolic 3) continue antiplatelets, changed from aspirin to Plavix with this admission  Also Given the patient has Mobitz Type II per his internist, I have asked for PCCM assistence for pressor management.   Ritta Slot, MD [facility] [phone]

## 2012-04-17 ENCOUNTER — Encounter: Payer: Self-pay | Admitting: Family Medicine

## 2012-04-17 DIAGNOSIS — I498 Other specified cardiac arrhythmias: Secondary | ICD-10-CM

## 2012-04-17 DIAGNOSIS — I6350 Cerebral infarction due to unspecified occlusion or stenosis of unspecified cerebral artery: Secondary | ICD-10-CM

## 2012-04-17 DIAGNOSIS — I10 Essential (primary) hypertension: Secondary | ICD-10-CM

## 2012-04-17 DIAGNOSIS — I959 Hypotension, unspecified: Secondary | ICD-10-CM | POA: Diagnosis not present

## 2012-04-17 DIAGNOSIS — I441 Atrioventricular block, second degree: Secondary | ICD-10-CM

## 2012-04-17 DIAGNOSIS — I6330 Cerebral infarction due to thrombosis of unspecified cerebral artery: Secondary | ICD-10-CM

## 2012-04-17 MED ORDER — STARCH (THICKENING) PO POWD: ORAL | Status: DC | PRN

## 2012-04-17 MED ORDER — RESOURCE THICKENUP CLEAR PO POWD
ORAL | Status: DC | PRN
  Filled 2012-04-17: qty 125

## 2012-04-17 NOTE — Progress Notes (Signed)
INITIAL ADULT NUTRITION ASSESSMENT Date: 04/17/2012   Time: 11:01 AM Reason for Assessment: MST  INTERVENTION: Magic cup BID between meals, each supplement provides 290 kcal and 9 grams of protein.   ASSESSMENT: Male 78 y.o.  Dx: Right sided weakness  Hx:  Past Medical History  Diagnosis Date  . GERD (gastroesophageal reflux disease) 08/1996  . Hyperlipemia 08/1986  . Hypertension Pre 1996  . Osteoporosis 12/2004  . Pneumonia 22 yoa  . History of Doppler echocardiogram 05/1995    MVP, MOD AI, MILD TR, MILD MR  . Elevated PSA     decided against further eval  . Diverticulosis   . Bilateral sensorineural hearing loss 02/2012    some conductive left side  . Compression fracture     multiple   Related Meds:     . sodium chloride   Intravenous STAT  . clopidogrel  75 mg Oral Q breakfast  . enoxaparin  40 mg Subcutaneous Q24H  . loratadine  10 mg Oral QPM  . pantoprazole  40 mg Oral Daily  . potassium chloride  40 mEq Oral Once  . sodium chloride  500 mL Intravenous Once  . DISCONTD: lisinopril  20 mg Oral Daily   Ht: 5\' 11"  (180.3 cm)  Wt: 165 lb 2 oz (74.9 kg)  Ideal Wt: 78.1 kg % Ideal Wt: 96%  Usual Wt: 170 lbs Wt Readings from Last 10 Encounters:  04/15/12 165 lb 2 oz (74.9 kg)  04/14/12 165 lb 4 oz (74.957 kg)  04/03/12 169 lb 8 oz (76.885 kg)  02/15/12 164 lb 12 oz (74.73 kg)  01/06/12 166 lb 8 oz (75.524 kg)  01/04/12 168 lb 4 oz (76.318 kg)  08/18/11 167 lb 4 oz (75.864 kg)  02/11/11 162 lb (73.483 kg)  07/16/10 170 lb 8 oz (77.338 kg)  07/08/10 169 lb 4 oz (76.771 kg)   % Usual Wt: 97%  Body mass index is 23.03 kg/(m^2). WNL  Food/Nutrition Related Hx:  3% weight loss PTA  Labs:  CMP     Component Value Date/Time   NA 139 04/16/2012 0605   K 3.3* 04/16/2012 0605   CL 100 04/16/2012 0605   CO2 29 04/16/2012 0605   GLUCOSE 89 04/16/2012 0605   BUN 20 04/16/2012 0605   CREATININE 1.05 04/16/2012 0605   CALCIUM 9.3 04/16/2012 0605   PROT 6.7 04/16/2012  0605   ALBUMIN 3.0* 04/16/2012 0605   AST 10 04/16/2012 0605   ALT 8 04/16/2012 0605   ALKPHOS 57 04/16/2012 0605   BILITOT 0.5 04/16/2012 0605   GFRNONAA 63* 04/16/2012 0605   GFRAA 73* 04/16/2012 0605   CBG (last 3)   Basename 04/15/12 1842  GLUCAP 85   Lab Results  Component Value Date   HGBA1C 5.8* 04/16/2012   Lipid Panel     Component Value Date/Time   CHOL 131 04/16/2012 0605   TRIG 100 04/16/2012 0605   HDL 31* 04/16/2012 0605   CHOLHDL 4.2 04/16/2012 0605   VLDL 20 04/16/2012 0605   LDLCALC 80 04/16/2012 0605    Intake/Output Summary (Last 24 hours) at 04/17/12 1556 Last data filed at 04/17/12 1320  Gross per 24 hour  Intake 231.58 ml  Output   1645 ml  Net -1413.42 ml    Last BM: 04/17/12  Diet Order: Dysphagia 1/Nectar  Supplements/Tube Feeding: none  IVF:    sodium chloride   DOPamine Last Rate: 7 mcg/kg/min (04/17/12 0645)   Pt presents with slurred speech and  right hemiparesis. Imaging confirms a left pontomedullary junction brainstem infarct. Infarct felt to be thrombolitic secondary to small vessel disease. Work up underway, per neurology.  Per pt he has lost about 5 lbs in the last 2 weeks. Pt states that he has was just too busy to eat. He reports good appetite now. Per pt and tech he ate about 50% of Breakfast this am.    Estimated Nutritional Needs:   Kcal:  1700-1900 Protein:  85-95 grams Fluid:  >2 L/day  NUTRITION DIAGNOSIS: -Inadequate oral intake (NI-2.1).  Status: Ongoing  RELATED TO: decreased appetite   AS EVIDENCE BY: 3% weight loss PTA  MONITORING/EVALUATION(Goals): Goal: Pt to meet >/= 90% of their estimated nutrition needs. Monitor: PO intake, diet/supplement acceptance  EDUCATION NEEDS: -No education needs identified at this time  DOCUMENTATION CODES Per approved criteria  -Not Applicable    Kendell Bane RD, LDN, CNSC [phone] Pager [phone] After Hours Pager  04/17/2012, 11:01 AM

## 2012-04-17 NOTE — Progress Notes (Signed)
Spoke with Jasmine December NP about dopamine goal. She stated to titrate down the dopamine and to maintain STY BP of 100. Titrate appropriately to maintain sty 100-140. Will continue to monitor. [place], [place] M

## 2012-04-17 NOTE — Progress Notes (Signed)
Name: Christopher [name] MRN: [id number] DOB: 1923/11/24    LOS: 2  PULMONARY / CRITICAL CARE MEDICINE  HPI:   78 year old male with PMH relevant for HTN, dyslipidemia, moderate aortic insufficiency per echo, GERD and compression fractures of the spine. Recently discharged from the hospital with a T11 compression fracture. Admitted on 9/7 with right hemiparesis. CT scan of the head ruled out acute bleed. Not a candidate for TPA because he was out of window. 9/8 the patient experience hypotension and PCCM consult was called for possible pressors in the neuro ICU. Antihypertensive medications were discontinued.   Vital Signs: Temp:  [97.4 F (36.3 C)-99 F (37.2 C)] 97.4 F (36.3 C) (09/09 0800) Pulse Rate:  [41-120] 49  (09/09 0800) Resp:  [11-24] 13  (09/09 0800) BP: (97-159)/(56-103) 137/68 mmHg (09/09 0800) SpO2:  [90 %-97 %] 97 % (09/09 0800)  Intake/Output Summary (Last 24 hours) at 04/17/12 1030 Last data filed at 04/17/12 0700  Gross per 24 hour  Intake 231.58 ml  Output   1195 ml  Net -963.42 ml    Physical Examination: General:  Pleasant male patient in no acute distress Neuro:  Awake, alert, oriented x 3. Right hemiparesis. HEENT:  PERRL, pink conjunctivae, moist membranes Neck:  Supple, no JVD   Cardiovascular:  Systolic/ diastolic murmur throughout the precordium. Lungs:  CTA Abdomen:  Soft, nontender, nondistended, bowel sounds present Musculoskeletal:  Moves all extremities, no pedal edema Skin:  No rash   Lab 04/15/12 2118 04/15/12 1815  TROPONINI <0.30 <0.30  LATICACIDVEN -- --  PROBNP -- --    Lab 04/16/12 0605 04/15/12 2118 04/15/12 1814  NA 139 -- 137  K 3.3* -- 3.4*  CL 100 -- 100  CO2 29 -- 28  BUN 20 -- 22  CREATININE 1.05 1.26 1.18  CALCIUM 9.3 -- 9.4  MG -- -- --  PHOS -- -- --   Intake/Output      09/08 0701 - 09/09 0700 09/09 0701 - 09/10 0700   I.V. (mL/kg) 231.6 (3.1)    Total Intake(mL/kg) 231.6 (3.1)    Urine (mL/kg/hr) 1495 (0.8)      Total Output 1495    Net -1263.4         Stool Occurrence 1 x      Lab 04/16/12 0605 04/15/12 1814  AST 10 9  ALT 8 10  ALKPHOS 57 60  BILITOT 0.5 0.4  PROT 6.7 7.3  ALBUMIN 3.0* 3.2*     Lab 04/16/12 0605 04/15/12 2118 04/15/12 1814  HGB 12.8* 12.7* 13.2  HCT 38.0* 37.2* 39.3  PLT 246 256 274  INR -- -- 1.13  APTT -- -- 29    Lab 04/16/12 0605 04/15/12 2118 04/15/12 1814  WBC 6.7 7.7 7.7  PROCALCITON -- -- --    Lab 04/15/12 1842  GLUCAP 85    Principal Problem:  *Right sided weakness Active Problems:  HYPERLIPIDEMIA  HYPERTENSION  Compression fracture  Bradycardia  Hypokalemia   ASSESSMENT AND PLAN:   78 years old male with PMH relevant for HTN, dyslipidemia, moderate aortic insufficiency per echo, GERD and compression fractures of the spine. Presents with right sided hemiparesis secondary to ischemic CVA. Experienced transient hypotension in the setting of antihypertensive drugs. Also found to have a Mobitz II AV block.   1) Hypotension improved, suspect due to meds, possibly also to AV block - For now BP at goal. - May try small fluid boluses before starting pressors.  - Stroke and  medical management per primary team.  PCCM will sign off, please call if we can help  Levy Pupa, MD, PhD 04/17/2012, 11:08 AM [facility] [phone] or if no answer [phone]

## 2012-04-17 NOTE — Consult Note (Signed)
Primary Care Physician: Eustaquio Boyden, MD Referring Physician:  Dr Josiah Lobo is a 78 y.o. male with a h/o HTN and vertebral fractures admitted with acute stroke.  He presented 04/15/12 with complaint of acute R sided weakness and slurred speech.  He was admitted and evaluated by neurology.  He is presently being observed closely in the ICU. On telemetry, the patient has been observed to have episodes of AV block.  EP is therefore consulted.  The patient is unaware of any cardiac symptoms.  He denies symptoms of palpitations, chest pain, shortness of breath, dizziness, presyncope, or syncope.  He is typically limited by backpain.    Past Medical History  Diagnosis Date  . GERD (gastroesophageal reflux disease) 08/1996  . Hyperlipemia 08/1986  . Hypertension Pre 1996  . Osteoporosis 12/2004  . Pneumonia 22 yoa  . History of Doppler echocardiogram 05/1995    MVP, MOD AI, MILD TR, MILD MR  . Elevated PSA     decided against further eval  . Diverticulosis   . Bilateral sensorineural hearing loss 02/2012    some conductive left side  . Compression fracture     multiple   Past Surgical History  Procedure Date  . Cholecystectomy 05/1995  . Exercise treadmill 09/12/2006    NML  . Inguinal hernia repair 08/14/2010    Dr. Daphine Deutscher  . Esophagogastroduodenoscopy 08/18/2004    [facility]. H. Dr. Russella Dar  . Colonoscopy 05/01/2007    divericulosis, int hemorrhoids - Dr. Russella Dar    Current Facility-Administered Medications  Medication Dose Route Frequency Provider Last Rate Last Dose  . 0.9 %  sodium chloride infusion   Intravenous STAT Gavin Pound. Ghim, MD      . 0.9 %  sodium chloride infusion   Intravenous Continuous Eduard Clos, MD      . clopidogrel (PLAVIX) tablet 75 mg  75 mg Oral Q breakfast Cristal Ford, MD   75 mg at 04/16/12 1404  . DOPamine (INTROPIN) 800 mg in dextrose 5 % 250 mL infusion  2-20 mcg/kg/min Intravenous Continuous Lonia Farber, MD 9.8 mL/hr  at 04/17/12 0645 7 mcg/kg/min at 04/17/12 0645  . enoxaparin (LOVENOX) injection 40 mg  40 mg Subcutaneous Q24H Eduard Clos, MD   40 mg at 04/16/12 2102  . fentaNYL (SUBLIMAZE) injection 12.5 mcg  12.5 mcg Intravenous Q4H PRN Eduard Clos, MD   12.5 mcg at 04/16/12 1712  . hydrALAZINE (APRESOLINE) injection 10 mg  10 mg Intravenous Q4H PRN Eduard Clos, MD      . iohexol (OMNIPAQUE) 350 MG/ML injection 50 mL  50 mL Intravenous Once PRN Medication Radiologist, MD   50 mL at 04/16/12 1543  . loratadine (CLARITIN) tablet 10 mg  10 mg Oral QPM Eduard Clos, MD   10 mg at 04/16/12 1404  . methocarbamol (ROBAXIN) tablet 500 mg  500 mg Oral Q8H PRN Rolan Lipa, NP   500 mg at 04/16/12 1403  . morphine 2 MG/ML injection 1 mg  1 mg Intravenous Once PRN Cristal Ford, MD   1 mg at 04/16/12 1140  . oxyCODONE (Oxy IR/ROXICODONE) immediate release tablet 5 mg  5 mg Oral Q4H PRN Eduard Clos, MD   5 mg at 04/16/12 2349  . pantoprazole (PROTONIX) EC tablet 40 mg  40 mg Oral Daily Eduard Clos, MD   40 mg at 04/16/12 1403  . potassium chloride SA (K-DUR,KLOR-CON) CR tablet 40 mEq  40  mEq Oral Once Cristal Ford, MD   40 mEq at 04/16/12 1405  . senna-docusate (Senokot-S) tablet 1 tablet  1 tablet Oral QHS PRN Eduard Clos, MD      . sodium chloride 0.9 % bolus 500 mL  500 mL Intravenous Once Ritta Slot, MD   500 mL at 04/16/12 1400  . DISCONTD: aspirin suppository 300 mg  300 mg Rectal Daily Eduard Clos, MD      . DISCONTD: aspirin tablet 325 mg  325 mg Oral Daily Eduard Clos, MD   325 mg at 04/15/12 2233  . DISCONTD: lisinopril (PRINIVIL,ZESTRIL) tablet 20 mg  20 mg Oral Daily Eduard Clos, MD   20 mg at 04/15/12 2232    No Known Allergies  History   Social History  . Marital Status: Single    Spouse Name: N/A    Number of Children: N/A  . Years of Education: N/A   Occupational History  . [employer]/Retired     Parttime delivers flowers   Social History Main Topics  . Smoking status: Former Games developer  . Smokeless tobacco: Former Neurosurgeon   Comment: smoked minimal as teen  . Alcohol Use: No  . Drug Use: No  . Sexually Active: No   Other Topics Concern  . Not on file   Social History Narrative   Caffeine: 2-3 cups/day, 1 gallon iced teaLives alone.  Widower.  2 dogs at home.Lots of driving.Activity: walking - 11min-1hr dailyDiet: some water, vegetables daily, occasional fruits    Family History  Problem Relation Age of Onset  . Heart disease Mother     MI  . Hypertension Mother   . Stroke Mother     ?  . Stroke Father   . Diabetes Sister   . Hypertension Sister   . Hypertension Sister   . Hypertension Sister   . Stroke Sister     recurrent mini strokes  . Cancer Neg Hx     ROS- All systems are reviewed and negative except as per the HPI above  Physical Exam: Filed Vitals:   04/17/12 0630 04/17/12 0645 04/17/12 0700 04/17/12 0800  BP: 101/66 150/103 129/87 137/68  Pulse:    49  Temp:      TempSrc:      Resp: 18 17 15 13   Height:      Weight:      SpO2: 96% 97% 96% 97%    GEN- NAD, alert and oriented x 3 today.   Head- normocephalic, atraumatic Eyes-  Sclera clear, conjunctiva pink Ears- hearing intact Oropharynx- clear Neck- supple, no JVP Lungs- Clear to ausculation bilaterally, normal work of breathing Heart- Regular rate and rhythm, 2/6 SEM at the apex GI- soft, NT, ND, + BS Extremities- no clubbing, cyanosis, or edema MS- age appropriate muscle atrophy Skin- no rash or lesion Psych- euthymic mood, full affect Neuro- strength and sensation are intact  EKGs are reviewed from August and September 2013 in [facility] EKG 04/16/12 reveals sinus bradycardia 49 bpm, PR 304, QRS 96, anterior precordial TW inversions  Echo 04/16/12-  EF 55-60%, severely calcified aortic calve with mild stenosis/regurgitation,  Labs are reviewed  Assessment and Plan:  1.  Second degree AV block I have reviewed Mr Mateo's telemetry at length.  This reveals sinus rhythm with baseline first degree AV block and episodes of second degree AV block.  Though occasionally PR prolongation is observed, there are multiple episodes of AV block during which there is  not significant PR prolongation.  Episodes of 2:1 AV block are also observed.  There are no episodes of consecutive P waves not conducted or markedly prolonged RR intervals.  He appears to be asymptomatic at this time. Given his advanced age and severe calcification in the region of the AV node/his purkinje by echo, I suspect that his episodic AV block represents advanced degeneration of his conduction system.  At some point, he will almost certainly need a pacemaker. Presently however in the setting of his acute CVA and in the absence of symptoms, I think that we should follow conservatively.  If his AV block worsens then a more expedient consideration of PPM may become necessary. In the interim, avoid AV nodal blocking agents. Keep atropine available at the bedside and consider dopamine/ transcutaneous pacing should he develop symptomatic complete heart block with slow ventricular escape.  2. HTN- stable  3. CVA- per primary team/ neurology  We will follow with you. Please call with questions.  Gennette Shadix,MD 8:40 AM 04/17/2012

## 2012-04-17 NOTE — Progress Notes (Signed)
Stroke Team Progress Note  HISTORY Christopher [name] is an 78 y.o. male with a history of hypertension and hyperlipidemia as well as a history of chronic back pain associated with vertebral fractures, presenting with new-onset slurred speech and weakness involving his right arm and leg. He was last known normal at about 4 AM 04/15/2012. He noticed difficulty moving his right arm and right leg when he woke up at about 7 AM. Strength improved. Family members noted however that his face was asymmetrical with speech was slurred. He also had inability to walk, including with use of his walker. CT scan showed no acute intracranial abnormality. Patient has been on aspirin 81 mg per day. Is no previous history stroke nor TIA. NIH stroke score was 4. Patient was not a TPA candidate secondary to delay in arriva. He was admitted to the neuro ICU for further evaluation and treatment.  SUBJECTIVE His daughter is at the bedside.  Overall he feels his condition is stable. Is sitting up in the bed, ST to work with him.   OBJECTIVE Most recent Vital Signs: Filed Vitals:   04/17/12 0615 04/17/12 0630 04/17/12 0645 04/17/12 0700  BP: 116/79 101/66 150/103 129/87  Pulse:      Temp:      TempSrc:      Resp: 14 18 17 15   Height:      Weight:      SpO2:  96% 97% 96%   CBG (last 3)   Basename 04/15/12 1842  GLUCAP 85   Intake/Output from previous day: 09/08 0701 - 09/09 0700 In: 231.6 [I.V.:231.6] Out: 1495 [Urine:1495]  IV Fluid Intake:     . sodium chloride    . DOPamine 7 mcg/kg/min (04/17/12 0645)   MEDICATIONS    . sodium chloride   Intravenous STAT  . clopidogrel  75 mg Oral Q breakfast  . enoxaparin  40 mg Subcutaneous Q24H  . loratadine  10 mg Oral QPM  . pantoprazole  40 mg Oral Daily  . potassium chloride  40 mEq Oral Once  . sodium chloride  500 mL Intravenous Once  . DISCONTD: aspirin  300 mg Rectal Daily  . DISCONTD: aspirin  325 mg Oral Daily  . DISCONTD: lisinopril  20 mg Oral Daily   PRN:  fentaNYL, hydrALAZINE, iohexol, methocarbamol, morphine injection, oxyCODONE, senna-docusate  Diet:  Dysphagia 1 nectar thick liquids Activity:  OOB with assistance DVT Prophylaxis:  Lovenox 40 mg sq daily   CLINICALLY SIGNIFICANT STUDIES Basic Metabolic Panel:  Lab 04/16/12 5621 04/15/12 2118 04/15/12 1814  NA 139 -- 137  K 3.3* -- 3.4*  CL 100 -- 100  CO2 29 -- 28  GLUCOSE 89 -- 102*  BUN 20 -- 22  CREATININE 1.05 1.26 --  CALCIUM 9.3 -- 9.4  MG -- -- --  PHOS -- -- --   Liver Function Tests:  Lab 04/16/12 0605 04/15/12 1814  AST 10 9  ALT 8 10  ALKPHOS 57 60  BILITOT 0.5 0.4  PROT 6.7 7.3  ALBUMIN 3.0* 3.2*   CBC:  Lab 04/16/12 0605 04/15/12 2118 04/15/12 1814  WBC 6.7 7.7 --  NEUTROABS 3.9 -- 4.8  HGB 12.8* 12.7* --  HCT 38.0* 37.2* --  MCV 94.1 94.4 --  PLT 246 256 --   Coagulation:  Lab 04/15/12 1814  LABPROT 14.7  INR 1.13   Cardiac Enzymes:  Lab 04/15/12 2118 04/15/12 1815  CKTOTAL -- 24  CKMB -- 1.6  CKMBINDEX -- --  TROPONINI <  0.30 <0.30   Urinalysis: No results found for this basename: COLORURINE:2,APPERANCEUR:2,LABSPEC:2,PHURINE:2,GLUCOSEU:2,HGBUR:2,BILIRUBINUR:2,KETONESUR:2,PROTEINUR:2,UROBILINOGEN:2,NITRITE:2,LEUKOCYTESUR:2 in the last 168 hours Lipid Panel    Component Value Date/Time   CHOL 131 04/16/2012 0605   TRIG 100 04/16/2012 0605   HDL 31* 04/16/2012 0605   CHOLHDL 4.2 04/16/2012 0605   VLDL 20 04/16/2012 0605   LDLCALC 80 04/16/2012 0605   HgbA1C  Lab Results  Component Value Date   HGBA1C 5.8* 04/16/2012   Urine Drug Screen:   No results found for this basename: labopia, cocainscrnur, labbenz, amphetmu, thcu, labbarb    Alcohol Level: No results found for this basename: ETH:2 in the last 168 hours  CT Head  04/15/2012  No acute intracranial abnormality.  Chronic small vessel disease and widespread dilated perivascular spaces.     CT of the angio brain  04/16/2012  1.  High-grade mid basilar artery stenosis is confirmed, details  above. 2.  Mild intracranial atherosclerosis elsewhere and a degree of ICA and vertebral artery dolichoectasia, no other intracranial stenosis.  No major circle of Willis branch occlusion. 3.  Stable CT appearance of the brain.   CT of the angio neck  04/16/2012   1.  Ectasia/fusiform aneurysmal enlargement of the visualized thoracic aorta.  Ascending aorta up to 44 mm diameter. 2.  Highly tortuous proximal great vessels diffusely.  Great vessel origin atherosclerosis without significant stenosis. 3.  Left worse than right calcified carotid bifurcation atherosclerosis, no significant stenosis on the right and 50-55% stenosis at the left ICA origin.  MRI of the brain  04/16/2012 1. 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  No mass effect or hemorrhage.  Basilar artery perforator territory. 2.  See MRA findings below. 3. Otherwise stable MRI appearance of the bran since 2011 including extensive perivascular spaces (etat crible appearance).   MRA of the brain  04/16/2012  1.  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 2.  Distal basilar remains patent. 3.  Mild to moderate distal right vertebral artery stenosis. 4.  Mild anterior circulation atherosclerosis, most apparent in the left MCA M2 branches.  2D Echocardiogram  EF 55-60% with no source of embolus.   Carotid Doppler    CXR  04/16/2012   1.  Atelectasis and/or consolidation left lower lobe with small left-sided pleural effusion. 2.  Large hiatal hernia again noted. 3.  Moderate cardiomegaly, unchanged. 4.  Atherosclerosis.    EKG  Sinus bradycardia with 1st degree A-V block; T wave abnormality, consider anterolateral ischemia; Prolonged QT  Therapy Recommendations PT - CIR; OT - CIR  Physical Exam   Neurologic Exam:  Mental Status:  Alert, oriented, thought content appropriate. Speech fluent without evidence of aphasia. No significant dysarthria. Able to follow 3 step commands without  difficulty.  Cranial Nerves:  II- Visual fields grossly intact.  III/IV/VI-Extraocular movements intact. Pupils reactive bilaterally.  V/VII-slight right lower facial droop.  VIII-hearing grossly intact  IX/X-uvula deviated to the right  XI-bilateral shoulder shrug intact  XII-midline tongue extension  Motor: 5/5 LUE/LLE, 4+/% RUE/RLE.normal tone and bulk, mild right pronator drift.  Sensory: Light touch intact throughout, bilaterally  Deep Tendon Reflexes: 2+ and symmetric throughout  Plantars: upgoing on right, downgoing on left.  Cerebellar: Normal finger-to-nose   ASSESSMENT [name] is a 78 y.o. male presenting with slurred speech and right hemiparesis.  Imaging confirms a  left pontomedullary junction brainstem infarct. Infarct felt to be thrombotic secondary to high grade proximal basilar/terminal left vertebral stenosis  Work up underway. On aspirin 81 mg orally every day prior to admission. Now on clopidogrel 75 mg orally every day for secondary stroke prevention. Patient with resultant right facial weakness, right hemiparesis.  -hyperlipidemia, LDL 80, not on statin - Permissive hypertension- on dopamine drip -Mobitz type II, no intervention needed, likely related to age  Hospital day # 2  TREATMENT/PLAN -Continue clopidogrel 75 mg orally every day for secondary stroke prevention. -wean dopamine drip if tolerated goal SBP 120-140. Hold BP medication for now for permissive hypertension -D/w dr Delton Coombes and family -discussed with patient and family role of intracranial angioplasty stenting and results of the SAMMPRIS trial of angioplasty/stenting versus aggressive medical therapy.. Recommend aggressive medical therapy first and consider angioplasty stenting only in case of recurrent strokes. This patient is critically ill and at significant risk of neurological worsening, death and care requires constant monitoring of vital signs, hemodynamics,respiratory and cardiac  monitoring,review of multiple databases, neurological assessment, discussion with family, other specialists and medical decision making of high complexity. I spent 30 minutes of neurocritical care time  in the care of  this patient.   Annie Main, MSN, RN, ANVP-BC, ANP-BC, [facility] Pager: [phone] 04/17/2012 7:53 AM  Scribe for Dr. Delia Heady, Stroke Center Medical Director, who has personally reviewed chart, pertinent data, examined the patient and developed the plan of care. Pager:  [phone]

## 2012-04-17 NOTE — Progress Notes (Signed)
Speech Language Pathology Dysphagia Treatment Patient Details Name: Christopher [name] MRN: [id number] DOB: 07-25-1924 Today's Date: 04/17/2012 Time: 4098-1191 SLP Time Calculation (min): 15 min  Assessment / Plan / Recommendation Clinical Impression  Pt. seen for dysphagia treatment during breakfast. Therapy focused on utilization of compensatory strategies. Pt. required mod verbal/tactile cueing to swallow 2x after liquids. No s/s of aspiration were present at bedside with puree and nectar consistencies. SLP recommends continuing Dys. 1 diet with nectar thick liquids (no straws). SLP reviewed results of MBS and precautions with daughter. Will f/u to check on swallow safety function.     Diet Recommendation  Continue with Current Diet: Dysphagia 1 (puree);Nectar-thick liquid    SLP Plan Continue with current plan of care      Swallowing Goals  SLP Swallowing Goals Patient will consume recommended diet without observed clinical signs of aspiration with: Modified independent assistance Swallow Study Goal #1 - Progress: Progressing toward goal Patient will utilize recommended strategies during swallow to increase swallowing safety with: Modified independent assistance Swallow Study Goal #2 - Progress: Progressing toward goal  General Temperature Spikes Noted: No Respiratory Status: Supplemental O2 delivered via (comment) Behavior/Cognition: Alert;Cooperative;Pleasant mood Oral Cavity - Dentition: Missing dentition Patient Positioning: Upright in bed  Oral Cavity - Oral Hygiene Does patient have any of the following "at risk" factors?: None of the above Brush patient's teeth BID with toothbrush (using toothpaste with fluoride): Yes Patient is AT RISK - Oral Care Protocol followed (see row info): Yes   Dysphagia Treatment Treatment focused on: Skilled observation of diet tolerance;Patient/family/caregiver education;Utilization of compensatory strategies Treatment Methods/Modalities: Skilled  observation;Other (comment) (multiple swallow) Patient observed directly with PO's: Yes Type of PO's observed: Dysphagia 1 (puree);Nectar-thick liquids Feeding: Able to feed self;Needs assist (difficulty with R hand, should use Left) Liquids provided via: Cup;No straw Type of cueing: Verbal;Tactile Amount of cueing: Moderate   GO     Theotis Burrow 04/17/2012, 1:33 PM

## 2012-04-17 NOTE — Progress Notes (Signed)
Physical Therapy Treatment Patient Details Name: Christopher [name] MRN: [id number] DOB: [birth year] Today's Date: 04/17/2012 Time: 0865-7846 PT Time Calculation (min): 34 min  PT Assessment / Plan / Recommendation Comments on Treatment Session  Pt was educated on back precautions and was given cues for proper technique and safety.  Pt experienced a decrease in blood pressure upon standing and ambulating but was asymptomatic.  Nurse was notified.    Follow Up Recommendations  Inpatient Rehab    Barriers to Discharge        Equipment Recommendations  None recommended by SLP    Recommendations for Other Services    Frequency Min 4X/week   Plan      Precautions / Restrictions Precautions Precautions: Fall;Back Precaution Comments: Pt had difficulty recalling the 3 back precautions.  Needed cueing for safety Restrictions Weight Bearing Restrictions: No   Pertinent Vitals/Pain Occasional lower back pain; at end of session BP 96/74    Mobility  Bed Mobility Bed Mobility: Rolling Right;Right Sidelying to Sit;Sitting - Scoot to [place] of Bed Transfers Transfers: Sit to Stand;Stand to Sit Sit to Stand: With upper extremity assist;From bed;1: +2 Total assist Sit to Stand: Patient Percentage: 70% Stand to Sit: 1: +2 Total assist;To chair/3-in-1;With upper extremity assist Stand to Sit: Patient Percentage: 70% Details for Transfer Assistance: Cues for proper technique, safety, and hand placement.  Manual and tactile cues for R LE. Ambulation/Gait Ambulation/Gait Assistance: 1: +2 Total assist Ambulation/Gait: Patient Percentage: 70% Ambulation Distance (Feet): 15 Feet Assistive device: Rolling walker Ambulation/Gait Assistance Details: Pt ambulated short distance from bed to door of room.  Tactile cues and manual cues for R knee to prevent hyperextension and decrease flexion during stance phase.  Tactile cues given at the hip to help facilitate proper step sequence and weight shifts.  Verbal  cues were given to initiate weight shift for stepping pattern. Gait Pattern: Step-through pattern;Decreased step length - left;Decreased step length - right;Trunk flexed Gait velocity: decreased Stairs: No    Exercises     PT Diagnosis:    PT Problem List:   PT Treatment Interventions:     PT Goals Acute Rehab PT Goals PT Goal Formulation: With patient Time For Goal Achievement: 04/30/12 Potential to Achieve Goals: Good Pt will go Supine/Side to Sit: with modified independence;with HOB 0 degrees PT Goal: Supine/Side to Sit - Progress: Progressing toward goal Pt will go Sit to Stand: with supervision PT Goal: Sit to Stand - Progress: Progressing toward goal Pt will go Stand to Sit: with supervision PT Goal: Stand to Sit - Progress: Progressing toward goal Pt will Ambulate: 51 - 150 feet;with min assist;with least restrictive assistive device PT Goal: Ambulate - Progress: Progressing toward goal  Visit Information  Last PT Received On: 04/17/12 Assistance Needed: +2    Subjective Data  Subjective: Feeling pretty good Patient Stated Goal: to go home   Cognition  Overall Cognitive Status: Impaired Area of Impairment: Safety/judgement Arousal/Alertness: Awake/alert Orientation Level: Appears intact for tasks assessed Behavior During Session: [facility] for tasks performed Current Attention Level: Selective Safety/Judgement: Decreased awareness of safety precautions;Decreased safety judgement for tasks assessed    Balance     End of Session PT - End of Session Equipment Utilized During Treatment: Gait belt Activity Tolerance: Patient tolerated treatment well Patient left: in chair;with call bell/phone within reach Nurse Communication: Mobility status   GP     DITOMMASO, AMY 04/17/2012, 12:50 PM Amy DiTommaso, SPT [place], PT DPT [phone]

## 2012-04-17 NOTE — Progress Notes (Signed)
*  PRELIMINARY RESULTS* Vascular Ultrasound Carotid Duplex (Doppler) has been completed.  Preliminary findings: bilaterally no significant ICA stenosis with antegrade vertebral flow.  Farrel Demark, RDMS, RVT  04/17/2012, 10:15 AM

## 2012-04-17 NOTE — Progress Notes (Signed)
SLP has reviewed and agrees with student's note below.  Goldie Dimmer Willis Jorma Tassinari M.Ed CCC-SLP Pager [phone]  04/17/2012  

## 2012-04-17 NOTE — Evaluation (Signed)
SLP has reviewed and agrees with student's note below.  Christopher [name] Danish M.Ed CCC-SLP Pager [phone]  04/17/2012  

## 2012-04-17 NOTE — Evaluation (Signed)
Speech Language Pathology Evaluation Patient Details Name: GWENDOLYN NISHI MRN: [id number] DOB: 09-Nov-1923 Today's Date: 04/17/2012 Time:  -     Problem List:  Patient Active Problem List  Diagnosis  . HYPERLIPIDEMIA  . HYPERTENSION  . HEMORRHOIDS, INTERNAL  . GERD  . HIATAL HERNIA  . DIVERTICULOSIS, COLON  . ROSACEA  . OSTEOPOROSIS  . PROSTATE SPECIFIC ANTIGEN, ELEVATED  . Dry skin  . Cellulitis of hand, right  . Medicare annual wellness visit, subsequent  . Abdominal pain  . Compression fracture  . Hypercalcemia  . Anemia  . Urgency of urination  . Constipation due to pain medication  . Dyspnea  . Right sided weakness  . Bradycardia  . Hypokalemia  . Second degree AV block  . Hypotension   Past Medical History:  Past Medical History  Diagnosis Date  . GERD (gastroesophageal reflux disease) 08/1996  . Hyperlipemia 08/1986  . Hypertension Pre 1996  . Osteoporosis 12/2004  . Pneumonia 22 yoa  . History of Doppler echocardiogram 05/1995    MVP, MOD AI, MILD TR, MILD MR  . Elevated PSA     decided against further eval  . Diverticulosis   . Bilateral sensorineural hearing loss 02/2012    some conductive left side  . Compression fracture     multiple  . AV block, 2nd degree 04/2012   Past Surgical History:  Past Surgical History  Procedure Date  . Cholecystectomy 05/1995  . Exercise treadmill 09/12/2006    NML  . Inguinal hernia repair 08/14/2010    Dr. Daphine Deutscher  . Esophagogastroduodenoscopy 08/18/2004    [facility]. Dr. Russella Dar  . Colonoscopy 05/01/2007    divericulosis, int hemorrhoids - Dr. Russella Dar   HPI:      Assessment / Plan / Recommendation Clinical Impression       SLP Assessment       Follow Up Recommendations       Frequency and Duration           SLP Goals  SLP Goals Potential to Achieve Goals: Good Progress/Goals/Alternative treatment plan discussed with pt/caregiver and they: Agree SLP Goal #1: Pt. will recall 3 concepts/idea during simple  conversation with min verbal cueing to use compensatory techniques. SLP Goal #2: Pt. will increase speech intelligibility using compensatory strategies with min verbal cueing.   SLP Evaluation Prior Functioning      Cognition  Arousal/Alertness: Awake/alert Orientation Level: Oriented X4    Comprehension       Expression     Oral / Motor     GO     Theotis Burrow 04/17/2012, 1:57 PM

## 2012-04-17 NOTE — Consult Note (Signed)
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 right-handed male with history of hypertension as well as hyperlipidemia and chronic back pain with associated vertebral fractures. Patient recently discharged from the hospital with a T.-11 compression fracture. Admitted 04/15/2012 with right side weakness and slurred speech. 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 Patient did not receive TPA. Echocardiogram with ejection fraction of 60% and no regional wall motion abnormalities. Carotid Dopplers were pending. Neurology services consulted placed on Plavix therapy for CVA prophylaxis as well as subcutaneous Lovenox. Bouts of orthostatic hypotension received fluid bolus and monitored. Speech therapy followup maintain on a dysphagia 1 nectar thick liquid diet. Physical and occupational therapy ongoing with recommendations of physical medicine rehabilitation consult to consider inpatient rehabilitation services.   Review of Systems  Gastrointestinal: Positive for constipation.  Musculoskeletal: Positive for myalgias and back pain.  Neurological: Positive for focal weakness and weakness.  All other systems reviewed and are negative.   Past Medical History  Diagnosis Date  . GERD (gastroesophageal reflux disease) 08/1996  . Hyperlipemia 08/1986  . Hypertension Pre 1996  . Osteoporosis 12/2004  . Pneumonia 22 yoa  . History of Doppler echocardiogram 05/1995    MVP, MOD AI, MILD TR, MILD MR  . Elevated PSA     decided against further eval  . Diverticulosis   . Bilateral sensorineural hearing loss 02/2012    some conductive left side  . Compression fracture     multiple   Past Surgical History  Procedure Date  .  Cholecystectomy 05/1995  . Exercise treadmill 09/12/2006    NML  . Inguinal hernia repair 08/14/2010    Dr. Daphine Deutscher  . Esophagogastroduodenoscopy 08/18/2004    [facility]. Dr. Russella Dar  . Colonoscopy 05/01/2007    divericulosis, int hemorrhoids - Dr. Russella Dar   Family History  Problem Relation Age of Onset  . Heart disease Mother     MI  . Hypertension Mother   . Stroke Mother     ?  . Stroke Father   . Diabetes Sister   . Hypertension Sister   . Hypertension Sister   . Hypertension Sister   . Stroke Sister     recurrent mini strokes  . Cancer Neg Hx    Social History:  reports that he has quit smoking. He has quit using smokeless tobacco. He reports that he does not drink alcohol or use illicit drugs. Allergies: No Known Allergies Medications Prior to Admission  Medication Sig Dispense Refill  . acetaminophen (TYLENOL) 500 MG tablet Take 1,000 mg by mouth every 6 (six) hours as needed. For pain      . alendronate (FOSAMAX) 70 MG tablet Take 70 mg by mouth every 7 (seven) days. Take with a full glass of water on an empty stomach.      Marland Kitchen aspirin EC 81 MG tablet Take 81 mg by mouth daily.      . Cholecalciferol (VITAMIN D) 1000 UNITS capsule Take 1,000 Units by mouth every evening.       . Emollient (EUCERIN CALMING DAILY MOIST) CREA Apply topically. Apply four times a day to affected area       . esomeprazole (NEXIUM) 40 MG capsule Take 40 mg by mouth daily before breakfast.      .  Flaxseed, Linseed, (FLAX SEED OIL) 1000 MG CAPS Take 1 capsule by mouth every evening.       Marland Kitchen ibuprofen (ADVIL,MOTRIN) 200 MG tablet Take 200 mg by mouth every 6 (six) hours as needed. For pain.      Marland Kitchen lisinopril-hydrochlorothiazide (PRINZIDE,ZESTORETIC) 20-25 MG per tablet Take 1 tablet by mouth daily.      Marland Kitchen loratadine (CLARITIN) 10 MG tablet Take 10 mg by mouth every evening.       Marland Kitchen oxyCODONE (OXY IR/ROXICODONE) 5 MG immediate release tablet Take 5 mg by mouth every 4 (four) hours as needed. For pain.      .  potassium chloride SA (K-DUR,KLOR-CON) 20 MEQ tablet Take 20 mEq by mouth 2 (two) times daily.        Home: Home Living Lives With: Alone Available Help at Discharge: Family (unsure of level of assist available) Type of Home: House Home Access: Level entry Home Layout: One level Bathroom Shower/Tub: Tub/shower unit;Curtain Firefighter: Standard Home Adaptive Equipment: Walker - rolling;Bedside commode/3-in-1  Functional History: Prior Function Dressing: Moderate Vocation: Retired Comments: Independent prior to recent T11 compression fx. Since fx, pt has had family member stop by intermittently during day to assist with ADLs. Has been sponge bathing since T11 fx. Functional Status:  Mobility: Bed Mobility Bed Mobility: Rolling Left;Left Sidelying to Sit;Sitting - Scoot to [place] of Bed Rolling Left: 4: Min assist Left Sidelying to Sit: 3: Mod assist;With rails Transfers Transfers: Sit to Stand;Stand to Sit Sit to Stand: 1: +2 Total assist;With upper extremity assist;From bed Sit to Stand: Patient Percentage: 70% Stand to Sit: 1: +2 Total assist;With upper extremity assist;To chair/3-in-1 Stand to Sit: Patient Percentage: 70% Ambulation/Gait Ambulation/Gait Assistance: 1: +2 Total assist Ambulation/Gait: Patient Percentage: 60% Ambulation Distance (Feet): 5 Feet Assistive device: Rolling walker Ambulation/Gait Assistance Details: Pt ambulated short distance from bed to chair. Assistance of RLE for initiation of hip flexion as well as control of knee during stance phase. Pt with decreased dorsiflexion, although when given cues was able to increase DF and control knee better although still required +2 for assistance.  Gait Pattern: Trunk flexed;Decreased dorsiflexion - right;Decreased stance time - right;Decreased step length - left;Step-to pattern;Right flexed knee in stance Stairs: No    ADL: ADL Upper Body Dressing: Performed;Set up Where Assessed - Upper Body Dressing:  Unsupported sitting Lower Body Dressing: Performed;+1 Total assistance Where Assessed - Lower Body Dressing: Unsupported sitting Toilet Transfer: Simulated;+2 Total assistance Toilet Transfer Method: Stand pivot Toilet Transfer Equipment:  (chair) Equipment Used: Rolling walker;Gait belt Transfers/Ambulation Related to ADLs: +2 assist ADL Comments: Pt has back brace (described as corset by pt) at home. Asked family member to bring in back brace.   Cognition: Cognition Arousal/Alertness: Awake/alert Orientation Level: Oriented X4 Cognition Overall Cognitive Status: Impaired Area of Impairment: Attention;Safety/judgement Arousal/Alertness: Awake/alert Orientation Level: Appears intact for tasks assessed Behavior During Session: [facility] for tasks performed Current Attention Level: Sustained Attention - Other Comments: difficulty with maintaining attention for >10 seconds on one task Safety/Judgement: Decreased awareness of safety precautions;Decreased safety judgement for tasks assessed  Blood pressure 147/90, pulse 90, temperature 98 F (36.7 C), temperature source Oral, resp. rate 24, height 5\' 11"  (1.803 m), weight 74.9 kg (165 lb 2 oz), SpO2 96.00%. Physical Exam  Vitals reviewed. Constitutional: He appears well-developed.  HENT:  Head: Normocephalic.  Eyes:       Pupils round and reactive to light  Neck: Normal range of motion. Neck supple. No thyromegaly present.  Cardiovascular:  Cardiac rate is controlled  Pulmonary/Chest: Breath sounds normal. No respiratory distress.  Abdominal: Bowel sounds are normal. He exhibits no distension. There is no tenderness.  Musculoskeletal: He exhibits no edema.  Neurological: He is alert.       Patient was able to give appropriate age date of birth in year. He followed three-step commands. Right central 7 and tonuge deviation, speech slightly slurred but intelligible. Strength 3/5 RUE, 3-4 RLE. No gross sensory changes.   Skin: Skin is  warm and dry.  Psychiatric: He has a normal mood and affect. His behavior is normal. Judgment and thought content normal.    No results found for this or any previous visit (from the past 24 hour(s)). Ct Angio Head W/cm &/or Wo Cm  04/16/2012  *RADIOLOGY REPORT*  Clinical Data:  78 year old male with suspected severe basilar artery stenosis.  Acute left brain stem infarct.  CT ANGIOGRAPHY HEAD AND NECK  Technique:  Multidetector CT imaging of the head and neck was performed using the standard protocol during bolus administration of intravenous contrast.  Multiplanar CT image reconstructions including MIPs were obtained to evaluate the vascular anatomy. Carotid stenosis measurements (when applicable) are obtained utilizing NASCET criteria, using the distal internal carotid diameter as the denominator.  Contrast: 50mL OMNIPAQUE IOHEXOL 350 MG/ML SOLN  Comparison:  Brain MRI and MRA earlier the same day.  CTA NECK  Findings:  No superior mediastinal lymphadenopathy.  Scarring and/or atelectasis in the lungs greater on the left.  Negative thyroid, larynx, pharynx, parapharyngeal spaces, retropharyngeal space, sublingual space, submandibular and parotid glands. No cervical lymphadenopathy.  Postoperative changes to the globes. Maxillary sinus mucous retention cyst.  Other Visualized paranasal sinuses and mastoids are clear.  Mild for age cervical spine degeneration. No acute osseous abnormality identified.  Vascular Findings:  Ectatic visualized thoracic aorta, visualized ascending aorta measuring up to 44 mm diameter.  Some tapering in the aortic arch, proximal descending aorta measuring 33 mm diameter.  Scattered soft and calcified plaque at the great vessel origins.  Three-vessel arch configuration.  Diffuse tortuosity of all great vessels.  Ectatic right brachial cephalic artery and left subclavian artery.  No stenosis at the right common carotid artery origin.  Tortuous proximal right common carotid.  Confluent  calcified plaque at the right ICA origin and medial bulb.  Subsequent stenosis is less than 50 % with respect to the distal vessel.  Mild ectatic and tortuous cervical right ICA otherwise.  Calcified atherosclerosis at the right vertebral artery origin resulting and no hemodynamically significant stenosis.  Tortuous proximal right vertebral artery.  Otherwise normal vertebral artery throughout the neck.  Highly tortuous proximal left common carotid artery.  Mild plaque at the origin with no stenosis.  At the left carotid bifurcation there is bulky calcified plaque extending into the left ICA origin and bulb resulting in stenosis of 58-55 % with respect to the distal vessel.  Beyond this level, tortuous but otherwise negative cervical left ICA.  Minimal plaque at the left vertebral artery origin which is normal. Tortuous proximal left vertebral artery.  Negative cervical left vertebral artery otherwise.  Intracranial findings are below.   Review of the MIP images confirms the above findings.  IMPRESSION: 1.  Ectasia/fusiform aneurysmal enlargement of the visualized thoracic aorta.  Ascending aorta up to 44 mm diameter. 2.  Highly tortuous proximal great vessels diffusely.  Great vessel origin atherosclerosis without significant stenosis. 3.  Left worse than right calcified carotid bifurcation atherosclerosis, no significant stenosis on the right  and 50-55% stenosis at the left ICA origin. 4.  Intracranial findings are below.  CTA HEAD  Findings:  No acute osseous abnormality identified.  Visualized scalp soft tissues are within normal limits.  No CT changes related to the left brain stem infarct.  Dilated perivascular spaces and other scattered white matter and deep gray matter hypodensity. No midline shift, mass effect, or evidence of mass lesion.  No acute intracranial hemorrhage identified.  No ventriculomegaly. No abnormal enhancement identified.  Vascular Findings: Major intracranial venous structures are  patent.  Codominant distal vertebral arteries are patent.  Scattered calcified plaque on the left without significant distal left vertebral artery stenosis.  Soft and calcified plaque on the right without hemodynamically significant stenosis.  Normal PICA vessels. Patent vertebrobasilar junction.  Proximal basilar artery is within normal limits.  Just beyond the AICA origins there is a high-grade stenosis along a 3 mm segment, visually a near radiographic string sign, numerically 76% stenosis with respect the distal vessel (but could be numerically underestimated).  The basilar artery beyond this level remains patent.  SCA and PCA origins are patent.  Mild bilateral PCA irregularity, otherwise PCA branches are within normal limits.  Posterior communicating arteries are diminutive or absent.  Antegrade flow in both ICA siphons.  Calcified atherosclerosis is greater on the left.  Mild ICA ectasia.  No hemodynamically significant ICA siphon stenosis.  Normal ophthalmic artery origins.  Normal carotid termini, MCA and ACA origins.  Fenestrated anterior communicating artery, otherwise within normal limits.  ACA branches are within normal limits. Bilateral MCA branches are within normal limits.   Review of the MIP images confirms the above findings.  IMPRESSION: 1.  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 2.  Mild intracranial atherosclerosis elsewhere and a degree of ICA and vertebral artery dolichoectasia, no other intracranial stenosis.  No major circle of Willis branch occlusion. 3.  Stable CT appearance of the brain.   Original Report Authenticated By: Harley Hallmark, M.D.    Dg Chest 2 View  04/16/2012  *RADIOLOGY REPORT*  Clinical Data: Stroke.  Recent T11 fracture.  Back pain.  CHEST - 2 VIEW  Comparison: Chest x-ray 04/02/2012.  Findings: There is a retrocardiac opacity on the left, opacities in the left base may represent areas of atelectasis and/or consolidation, with superimposed  small left-sided pleural effusion. Large hiatal hernia.  Right lung is clear.  No evidence of pulmonary edema.  Moderate cardiomegaly is unchanged.  Mediastinal contours are slightly distorted by patient rotation to the right and lordotic positioning, but similar to priors.  Atherosclerosis in the thoracic aorta.  IMPRESSION: 1.  Atelectasis and/or consolidation left lower lobe with small left-sided pleural effusion. 2.  Large hiatal hernia again noted. 3.  Moderate cardiomegaly, unchanged. 4.  Atherosclerosis.   Original Report Authenticated By: Florencia Reasons, M.D.    Ct Head Wo Contrast  04/15/2012  *RADIOLOGY REPORT*  Clinical Data: 78 year old male with weakness.  CT HEAD WITHOUT CONTRAST  Technique:  Contiguous axial images were obtained from the base of the skull through the vertex without contrast.  Comparison: Head CT of brain MRI 04/16/2010.  Findings: Left maxillary sinus mucous retention cyst.  Other Visualized paranasal sinuses and mastoids are clear.  No acute osseous abnormality identified.  Visualized orbits and scalp soft tissues are within normal limits.  Calcified atherosclerosis at the skull base.  Widespread chronic lacunar infarcts in the deep gray matter nuclei.  Similar appearance on the prior; by MRI some  of these are dilated perivascular spaces.  No ventriculomegaly. No midline shift, mass effect, or evidence of mass lesion.  No acute intracranial hemorrhage identified.   Scattered other cerebral white matter hypodensity also in part related to perivascular spaces. No evidence of cortically based acute infarction identified.  No suspicious intracranial vascular hyperdensity.  IMPRESSION: No acute intracranial abnormality.  Chronic small vessel disease and widespread dilated perivascular spaces.   Original Report Authenticated By: Harley Hallmark, M.D.    Ct Angio Neck W/cm &/or Wo/cm  04/16/2012  *RADIOLOGY REPORT*  Clinical Data:  78 year old male with suspected severe basilar  artery stenosis.  Acute left brain stem infarct.  CT ANGIOGRAPHY HEAD AND NECK  Technique:  Multidetector CT imaging of the head and neck was performed using the standard protocol during bolus administration of intravenous contrast.  Multiplanar CT image reconstructions including MIPs were obtained to evaluate the vascular anatomy. Carotid stenosis measurements (when applicable) are obtained utilizing NASCET criteria, using the distal internal carotid diameter as the denominator.  Contrast: 50mL OMNIPAQUE IOHEXOL 350 MG/ML SOLN  Comparison:  Brain MRI and MRA earlier the same day.  CTA NECK  Findings:  No superior mediastinal lymphadenopathy.  Scarring and/or atelectasis in the lungs greater on the left.  Negative thyroid, larynx, pharynx, parapharyngeal spaces, retropharyngeal space, sublingual space, submandibular and parotid glands. No cervical lymphadenopathy.  Postoperative changes to the globes. Maxillary sinus mucous retention cyst.  Other Visualized paranasal sinuses and mastoids are clear.  Mild for age cervical spine degeneration. No acute osseous abnormality identified.  Vascular Findings:  Ectatic visualized thoracic aorta, visualized ascending aorta measuring up to 44 mm diameter.  Some tapering in the aortic arch, proximal descending aorta measuring 33 mm diameter.  Scattered soft and calcified plaque at the great vessel origins.  Three-vessel arch configuration.  Diffuse tortuosity of all great vessels.  Ectatic right brachial cephalic artery and left subclavian artery.  No stenosis at the right common carotid artery origin.  Tortuous proximal right common carotid.  Confluent calcified plaque at the right ICA origin and medial bulb.  Subsequent stenosis is less than 50 % with respect to the distal vessel.  Mild ectatic and tortuous cervical right ICA otherwise.  Calcified atherosclerosis at the right vertebral artery origin resulting and no hemodynamically significant stenosis.  Tortuous proximal  right vertebral artery.  Otherwise normal vertebral artery throughout the neck.  Highly tortuous proximal left common carotid artery.  Mild plaque at the origin with no stenosis.  At the left carotid bifurcation there is bulky calcified plaque extending into the left ICA origin and bulb resulting in stenosis of 58-55 % with respect to the distal vessel.  Beyond this level, tortuous but otherwise negative cervical left ICA.  Minimal plaque at the left vertebral artery origin which is normal. Tortuous proximal left vertebral artery.  Negative cervical left vertebral artery otherwise.  Intracranial findings are below.   Review of the MIP images confirms the above findings.  IMPRESSION: 1.  Ectasia/fusiform aneurysmal enlargement of the visualized thoracic aorta.  Ascending aorta up to 44 mm diameter. 2.  Highly tortuous proximal great vessels diffusely.  Great vessel origin atherosclerosis without significant stenosis. 3.  Left worse than right calcified carotid bifurcation atherosclerosis, no significant stenosis on the right and 50-55% stenosis at the left ICA origin. 4.  Intracranial findings are below.  CTA HEAD  Findings:  No acute osseous abnormality identified.  Visualized scalp soft tissues are within normal limits.  No CT changes related to  the left brain stem infarct.  Dilated perivascular spaces and other scattered white matter and deep gray matter hypodensity. No midline shift, mass effect, or evidence of mass lesion.  No acute intracranial hemorrhage identified.  No ventriculomegaly. No abnormal enhancement identified.  Vascular Findings: Major intracranial venous structures are patent.  Codominant distal vertebral arteries are patent.  Scattered calcified plaque on the left without significant distal left vertebral artery stenosis.  Soft and calcified plaque on the right without hemodynamically significant stenosis.  Normal PICA vessels. Patent vertebrobasilar junction.  Proximal basilar artery is within  normal limits.  Just beyond the AICA origins there is a high-grade stenosis along a 3 mm segment, visually a near radiographic string sign, numerically 76% stenosis with respect the distal vessel (but could be numerically underestimated).  The basilar artery beyond this level remains patent.  SCA and PCA origins are patent.  Mild bilateral PCA irregularity, otherwise PCA branches are within normal limits.  Posterior communicating arteries are diminutive or absent.  Antegrade flow in both ICA siphons.  Calcified atherosclerosis is greater on the left.  Mild ICA ectasia.  No hemodynamically significant ICA siphon stenosis.  Normal ophthalmic artery origins.  Normal carotid termini, MCA and ACA origins.  Fenestrated anterior communicating artery, otherwise within normal limits.  ACA branches are within normal limits. Bilateral MCA branches are within normal limits.   Review of the MIP images confirms the above findings.  IMPRESSION: 1.  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 2.  Mild intracranial atherosclerosis elsewhere and a degree of ICA and vertebral artery dolichoectasia, no other intracranial stenosis.  No major circle of Willis branch occlusion. 3.  Stable CT appearance of the brain.   Original Report Authenticated By: Harley Hallmark, M.D.    Mr Brain Wo Contrast  04/16/2012  *RADIOLOGY REPORT*  Clinical Data:  78 year old male increased weakness greater on the right.  Comparison: Head CT without contrast 04/15/2012.  Brain MRI 90 11.  MRI HEAD WITHOUT CONTRAST  Technique: Multiplanar, multiecho pulse sequences of the brain and surrounding structures were obtained according to standard protocol without intravenous contrast.  Findings: No supratentorial restricted diffusion.  Cerebellum diffusion also within normal limits.  Confirmed on thin slice diffusion imaging there is a left paracentral patchy diffusion abnormality at the left pontomedullary junction (series 11 image 17).   Major intracranial vascular flow voids are stable.  No midline shift, ventriculomegaly, mass effect, evidence of mass lesion, extra-axial collection or acute intracranial hemorrhage. Cervicomedullary junction and pituitary are within normal limits.  Pronounced and extensive dilated perivascular spaces throughout the deep gray matter nuclei and extending toward the corona radiata (etat crible appearance) are felt to be superimposed on some chronic lacunar infarcts. Mild for age additional patchy T2 and FLAIR hyperintensity in the cerebral white matter.  Overall signal in the supratentorial brain is stable.  No signal changes mass effect or hemorrhage in the left brain stem.  Negative visualized cervical spine normal marrow signal.  Chronic paranasal sinus mucous retention cyst are stable.  Stable orbits. Mastoids are clear.  Negative scalp soft tissues.  IMPRESSION: 1. 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  No mass effect or hemorrhage.  Basilar artery perforator territory. 2.  See MRA findings below. 3. Otherwise stable MRI appearance of the brain since 2011 including extensive perivascular spaces (etat crible appearance).  MRA HEAD WITHOUT CONTRAST  Technique: Angiographic images of the Circle of Willis were obtained using MRA technique without  intravenous contrast.  Findings: Antegrade flow in the posterior circulation with codominant distal vertebral arteries.  Normal PICA origins.  The moderate stenosis of the distal right vertebral artery just proximal to the vertebrobasilar junction which remains patent. However, there is severe stenosis of the mid basilar artery (series 805 image 7) which does not appear to be related to technical artifact.  Despite this the distal basilar is patent.  SCA and PCA origins are within normal limits.  Posterior communicating arteries are diminutive or absent.  Bilateral PCA branches are within normal limits.  Antegrade flow in both ICA siphons.  No  ICA stenosis.  Ophthalmic artery origins are within normal limits.  Carotid termini are patent within normal limits.  MCA and ACA origins are within normal limits.  Anterior communicating artery, visualized bilateral ACA, visualized right MCA branches are within normal limits.  There is mild to moderate irregularity in the left MCA M2 branches, no major branch occlusion.  IMPRESSION: 1.  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 2.  Distal basilar remains patent. 3.  Mild to moderate distal right vertebral artery stenosis. 4.  Mild anterior circulation atherosclerosis, most apparent in the left MCA M2 branches.  Salient findings discussed by telephone with Dr. Amada Jupiter on 04/16/2012 at 1255 hours.   Original Report Authenticated By: Harley Hallmark, M.D.    Dg Swallowing Func-no Report  04/16/2012  CLINICAL DATA: CVA   FLUOROSCOPY FOR SWALLOWING FUNCTION STUDY:  Fluoroscopy was provided for swallowing function study, which was  administered by a speech pathologist.  Final results and recommendations  from this study are contained within the speech pathology report.     Mr Maxine Glenn Head/brain Wo Cm  04/16/2012  *RADIOLOGY REPORT*  Clinical Data:  78 year old male increased weakness greater on the right.  Comparison: Head CT without contrast 04/15/2012.  Brain MRI 90 11.  MRI HEAD WITHOUT CONTRAST  Technique: Multiplanar, multiecho pulse sequences of the brain and surrounding structures were obtained according to standard protocol without intravenous contrast.  Findings: No supratentorial restricted diffusion.  Cerebellum diffusion also within normal limits.  Confirmed on thin slice diffusion imaging there is a left paracentral patchy diffusion abnormality at the left pontomedullary junction (series 11 image 17).  Major intracranial vascular flow voids are stable.  No midline shift, ventriculomegaly, mass effect, evidence of mass lesion, extra-axial collection or acute intracranial  hemorrhage. Cervicomedullary junction and pituitary are within normal limits.  Pronounced and extensive dilated perivascular spaces throughout the deep gray matter nuclei and extending toward the corona radiata (etat crible appearance) are felt to be superimposed on some chronic lacunar infarcts. Mild for age additional patchy T2 and FLAIR hyperintensity in the cerebral white matter.  Overall signal in the supratentorial brain is stable.  No signal changes mass effect or hemorrhage in the left brain stem.  Negative visualized cervical spine normal marrow signal.  Chronic paranasal sinus mucous retention cyst are stable.  Stable orbits. Mastoids are clear.  Negative scalp soft tissues.  IMPRESSION: 1. 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  No mass effect or hemorrhage.  Basilar artery perforator territory. 2.  See MRA findings below. 3. Otherwise stable MRI appearance of the brain since 2011 including extensive perivascular spaces (etat crible appearance).  MRA HEAD WITHOUT CONTRAST  Technique: Angiographic images of the Circle of Willis were obtained using MRA technique without  intravenous contrast.  Findings: Antegrade flow in the posterior circulation with codominant distal vertebral arteries.  Normal PICA origins.  The  moderate stenosis of the distal right vertebral artery just proximal to the vertebrobasilar junction which remains patent. However, there is severe stenosis of the mid basilar artery (series 805 image 7) which does not appear to be related to technical artifact.  Despite this the distal basilar is patent.  SCA and PCA origins are within normal limits.  Posterior communicating arteries are diminutive or absent.  Bilateral PCA branches are within normal limits.  Antegrade flow in both ICA siphons.  No ICA stenosis.  Ophthalmic artery origins are within normal limits.  Carotid termini are patent within normal limits.  MCA and ACA origins are within normal limits.   Anterior communicating artery, visualized bilateral ACA, visualized right MCA branches are within normal limits.  There is mild to moderate irregularity in the left MCA M2 branches, no major branch occlusion.  IMPRESSION: 1.  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 2.  Distal basilar remains patent. 3.  Mild to moderate distal right vertebral artery stenosis. 4.  Mild anterior circulation atherosclerosis, most apparent in the left MCA M2 branches.  Salient findings discussed by telephone with Dr. Amada Jupiter on 04/16/2012 at 1255 hours.   Original Report Authenticated By: Harley Hallmark, M.D.     Assessment/Plan: Diagnosis: left ponto-medullary infarct with right hemiparesis 1. Does the need for close, 24 hr/day medical supervision in concert with the patient's rehab needs make it unreasonable for this patient to be served in a less intensive setting? Yes 2. Co-Morbidities requiring supervision/potential complications: htn, bradycardia 3. Due to bladder management, bowel management, safety, skin/wound care, disease management, medication administration, pain management and patient education, does the patient require 24 hr/day rehab nursing? Yes 4. Does the patient require coordinated care of a physician, rehab nurse, PT (1-2 hrs/day, 5 days/week), OT (1-2 hrs/day, 5 days/week) and SLP (1-2 hrs/day, 5 days/week) to address physical and functional deficits in the context of the above medical diagnosis(es)? Yes Addressing deficits in the following areas: balance, endurance, locomotion, strength, transferring, bowel/bladder control, bathing, dressing, feeding, grooming, toileting, cognition, speech, language, swallowing and psychosocial support 5. Can the patient actively participate in an intensive therapy program of at least 3 hrs of therapy per day at least 5 days per week? Yes 6. The potential for patient to make measurable gains while on inpatient rehab is  excellent 7. Anticipated functional outcomes upon discharge from inpatient rehab are mod I with PT, mod I to supervision with OT, mod I to supervision with SLP. 8. Estimated rehab length of stay to reach the above functional goals is: 2 weeks 9. Does the patient have adequate social supports to accommodate these discharge functional goals? Yes 10. Anticipated D/C setting: Home 11. Anticipated post D/C treatments: HH therapy 12. Overall Rehab/Functional Prognosis: excellent  RECOMMENDATIONS: This patient's condition is appropriate for continued rehabilitative care in the following setting: CIR Patient has agreed to participate in recommended program. Yes Note that insurance prior authorization may be required for reimbursement for recommended care.  Comment:Rehab RN to follow up.   Ivory Broad, MD     04/17/2012

## 2012-04-18 LAB — BASIC METABOLIC PANEL
BUN: 29 mg/dL — ABNORMAL HIGH (ref 6–23)
CO2: 26 mEq/L (ref 19–32)
Calcium: 9.5 mg/dL (ref 8.4–10.5)
Chloride: 103 mEq/L (ref 96–112)
Creatinine, Ser: 1.01 mg/dL (ref 0.50–1.35)
Glucose, Bld: 133 mg/dL — ABNORMAL HIGH (ref 70–99)

## 2012-04-18 MED ORDER — POTASSIUM CHLORIDE CRYS ER 20 MEQ PO TBCR
40.0000 meq | EXTENDED_RELEASE_TABLET | Freq: Two times a day (BID) | ORAL | Status: AC
  Administered 2012-04-18 (×2): 40 meq via ORAL
  Filled 2012-04-18 (×3): qty 2

## 2012-04-18 MED ORDER — ATROPINE SULFATE 1 MG/ML IJ SOLN
0.4000 mg | Freq: Once | INTRAMUSCULAR | Status: AC | PRN
  Filled 2012-04-18 (×2): qty 0.4

## 2012-04-18 MED ORDER — ASPIRIN 81 MG PO CHEW
81.0000 mg | CHEWABLE_TABLET | Freq: Every day | ORAL | Status: DC
  Administered 2012-04-18: 81 mg via ORAL
  Filled 2012-04-18 (×2): qty 1

## 2012-04-18 NOTE — Plan of Care (Signed)
Problem: Phase II Progression Outcomes Goal: Able to communicate Outcome: Completed/Met Date Met:  04/18/12 Slurred speech, no aphasia noted

## 2012-04-18 NOTE — Progress Notes (Signed)
SUBJECTIVE: The patient is doing well today.  At this time, he denies chest pain, shortness of breath, or any new concerns.  Making slow recovery from his stroke.     . clopidogrel  75 mg Oral Q breakfast  . enoxaparin  40 mg Subcutaneous Q24H  . loratadine  10 mg Oral QPM  . pantoprazole  40 mg Oral Daily      . DOPamine 1 mcg/kg/min (04/17/12 1558)    OBJECTIVE: Physical Exam: Filed Vitals:   04/18/12 0200 04/18/12 0300 04/18/12 0400 04/18/12 0500  BP: 107/72 105/71 129/90 128/68  Pulse: 54 66 72 61  Temp:   97.5 F (36.4 C)   TempSrc:   Oral   Resp: 12 10 13 10   Height:      Weight:      SpO2: 98% 96% 97% 98%    Intake/Output Summary (Last 24 hours) at 04/18/12 0757 Last data filed at 04/17/12 1320  Gross per 24 hour  Intake      0 ml  Output    450 ml  Net   -450 ml    Telemetry reveals sinus rhythm with rare mobitz II AV block (improved), sinus bradycardia also observed  GEN- The patient is well appearing, alert and oriented x 3 today.   Head- normocephalic, atraumatic Eyes-  Sclera clear, conjunctiva pink Ears- hearing intact Oropharynx- clear Lungs- Clear to ausculation bilaterally, normal work of breathing Heart- Regular rate and rhythm, no murmurs, rubs or gallops, PMI not laterally displaced GI- soft, NT, ND, + BS Extremities- no clubbing, cyanosis, or edema   LABS: Basic Metabolic Panel:  Basename 04/16/12 0605 04/15/12 2118 04/15/12 1814  NA 139 -- 137  K 3.3* -- 3.4*  CL 100 -- 100  CO2 29 -- 28  GLUCOSE 89 -- 102*  BUN 20 -- 22  CREATININE 1.05 1.26 --  CALCIUM 9.3 -- 9.4  MG -- -- --  PHOS -- -- --   Liver Function Tests:  [facility] 04/16/12 0605 04/15/12 1814  AST 10 9  ALT 8 10  ALKPHOS 57 60  BILITOT 0.5 0.4  PROT 6.7 7.3  ALBUMIN 3.0* 3.2*   No results found for this basename: LIPASE:2,AMYLASE:2 in the last 72 hours CBC:  Basename 04/16/12 0605 04/15/12 2118 04/15/12 1814  WBC 6.7 7.7 --  NEUTROABS 3.9 -- 4.8  HGB  12.8* 12.7* --  HCT 38.0* 37.2* --  MCV 94.1 94.4 --  PLT 246 256 --   Cardiac Enzymes:  Basename 04/15/12 2118 04/15/12 1815  CKTOTAL -- 24  CKMB -- 1.6  CKMBINDEX -- --  TROPONINI <0.30 <0.30   BNP: No components found with this basename: POCBNP:3 D-Dimer: No results found for this basename: DDIMER:2 in the last 72 hours Hemoglobin A1C:  Basename 04/16/12 0605  HGBA1C 5.8*   Fasting Lipid Panel:  Basename 04/16/12 0605  CHOL 131  HDL 31*  LDLCALC 80  TRIG 161  CHOLHDL 4.2  LDLDIRECT --   Thyroid Function Tests:  Basename 04/16/12 0605  TSH 0.527  T4TOTAL --  T3FREE --  THYROIDAB --    ASSESSMENT AND PLAN:  Principal Problem:  *Right sided weakness Active Problems:  HYPERLIPIDEMIA  HYPERTENSION  Compression fracture  Bradycardia  Hypokalemia  Second degree AV block  Hypotension  1. Second degree AV block  Telemetry is very stable overnight.  He has had some sinus node slowing but AV block has actually improved.   There has not been significant PR prolongation and  tere are no episodes of consecutive P waves not conducted.  At this point we will follow conservatively.  Avoid AV nodal blocking agents.  Repeat BMET and replete K if  <3.9  2. HTN- stable   3. CVA- per primary team/ neurology    Please call with questions.   Hillis Range, MD 04/18/2012 7:57 AM

## 2012-04-18 NOTE — Progress Notes (Addendum)
Speech Language Pathology Dysphagia Treatment Patient Details Name: Christopher [name] MRN: [id number] DOB: [birth year] Today's Date: 04/18/2012 Time: 0947-     Assessment / Plan / Recommendation Clinical Impression  Treatment for dysphagia to further ensure safety with recommended diet during breakfast meal.  Pt. recalled swallow prrecaution without cues needed and implemented with supervision cues.  Subtle throat clear x 1.  Educated pt. on possibly upgrading diet texture if family can bring lower partial plate.  Will continue to assess for safety and diet upgrade when appropriate.     Diet Recommendation  Continue with Current Diet: Dysphagia 1 (puree);Nectar-thick liquid    SLP Plan Continue with current plan of care       Swallowing Goals  SLP Swallowing Goals Patient will consume recommended diet without observed clinical signs of aspiration with: Modified independent assistance Swallow Study Goal #1 - Progress: Progressing toward goal Patient will utilize recommended strategies during swallow to increase swallowing safety with: Modified independent assistance Swallow Study Goal #2 - Progress: Progressing toward goal  General Respiratory Status: Supplemental O2 delivered via (comment) Behavior/Cognition: Alert;Cooperative;Pleasant mood Oral Cavity - Dentition: Missing dentition Patient Positioning: Upright in bed  Oral Cavity - Oral Hygiene Does patient have any of the following "at risk" factors?: Diet - patient on thickened liquids;Other - dysphagia Brush patient's teeth BID with toothbrush (using toothpaste with fluoride): Yes Patient is HIGH RISK - Oral Care Protocol followed (see row info): Yes   Dysphagia Treatment Treatment focused on: Skilled observation of diet tolerance;Patient/family/caregiver education;Utilization of compensatory strategies Treatment Methods/Modalities: Skilled observation Patient observed directly with PO's: Yes Type of PO's observed: Dysphagia 1  (puree);Nectar-thick liquids Feeding: Able to feed self Liquids provided via: Cup;No straw Type of cueing: Verbal Amount of cueing: Minimal       Royce Macadamia M.Ed [employer] [phone]  04/18/2012

## 2012-04-18 NOTE — Progress Notes (Signed)
Occupational Therapy Treatment Patient Details Name: Christopher [name] MRN: [id number] DOB: 11-27-1923 Today's Date: 04/18/2012 Time: 5784-6962 OT Time Calculation (min): 38 min  OT Assessment / Plan / Recommendation Comments on Treatment Session This patient making progress since eval    Follow Up Recommendations  Inpatient Rehab    Barriers to Discharge       Equipment Recommendations  Defer to next venue    Recommendations for Other Services Rehab consult  Frequency Min 3X/week   Plan      Precautions / Restrictions Precautions Precautions: Fall;Back Required Braces or Orthoses: Spinal Brace Spinal Brace: Thoracolumbosacral orthotic;Applied in sitting position;Applied in standing position (per pt) Restrictions Weight Bearing Restrictions: No       ADL  Grooming: Performed;Shaving;Wash/dry face (Min guard A wash face, Mod A to finish shaving) Where Assessed - Grooming: Supported standing;Supported sitting (standing for face, 1/2 and 1/2 for shaving) Toilet Transfer: Simulated;Minimal assistance Toilet Transfer Method: Sit to Barista:  (Bed, out into hallway, stood at sink grooming, to recliner) Equipment Used: Rolling walker;Back brace;Gait belt Transfers/Ambulation Related to ADLs: +2 for lines and tubes      OT Goals ADL Goals ADL Goal: Grooming - Progress: Progressing toward goals ADL Goal: Statistician - Progress: Progressing toward goals  Visit Information  Last OT Received On: 04/18/12 Assistance Needed: +1 (with +1 for lines/tubes)    Subjective Data  Subjective: I feel a little better now that I got up and did some things      Cognition  Overall Cognitive Status: Appears within functional limits for tasks assessed/performed Arousal/Alertness: Awake/alert Orientation Level: Appears intact for tasks assessed Behavior During Session: [facility] for tasks performed    Mobility   Bed Mobility Bed Mobility: Rolling Right;Right  Sidelying to Sit;Sitting - Scoot to Edge of Bed Rolling Right: 4: Min guard;With rail Right Sidelying to Sit: HOB flat;4: Min guard;With rails Sitting - Scoot to Edge of Bed: 5: Supervision Transfers Transfers: Sit to Stand;Stand to Sit Sit to Stand: 4: Min assist;With upper extremity assist;From bed Stand to Sit: 4: Min guard;With upper extremity assist;With armrests;To chair/3-in-1 (controlled)     Exercises  Other Exercises Other Exercises: Noted mild decreased strength RUE and mild decreased coordination RUE      End of Session OT - End of Session Equipment Utilized During Treatment: Gait belt;Back brace (RW) Activity Tolerance: Patient tolerated treatment well Patient left: in chair;with call bell/phone within reach Nurse Communication: Mobility status (nurse saw [place] walking on unit)       Evette Georges [phone] 04/18/2012, 3:25 PM

## 2012-04-18 NOTE — Progress Notes (Signed)
Stroke Team Progress Note  HISTORY Christopher [name] is an 78 y.o. male with a history of hypertension and hyperlipidemia as well as a history of chronic back pain associated with vertebral fractures, presenting with new-onset slurred speech and weakness involving his right arm and leg. He was last known normal at about 4 AM 04/15/2012. He noticed difficulty moving his right arm and right leg when he woke up at about 7 AM. Strength improved. Family members noted however that his face was asymmetrical with speech was slurred. He also had inability to walk, including with use of his walker. CT scan showed no acute intracranial abnormality. Patient has been on aspirin 81 mg per day. Is no previous history stroke nor TIA. NIH stroke score was 4. Patient was not a TPA candidate secondary to delay in arriva. He was admitted to the neuro ICU for further evaluation and treatment.  SUBJECTIVE No complaints. Pt interactive in conversation.  OBJECTIVE Most recent Vital Signs: Filed Vitals:   04/18/12 0200 04/18/12 0300 04/18/12 0400 04/18/12 0500  BP: 107/72 105/71 129/90 128/68  Pulse: 54 66 72 61  Temp:   97.5 F (36.4 C)   TempSrc:   Oral   Resp: 12 10 13 10   Height:      Weight:      SpO2: 98% 96% 97% 98%   Intake/Output from previous day: 09/09 0701 - 09/10 0700 In: -  Out: 450 [Urine:450]  IV Fluid Intake:     . DOPamine 1 mcg/kg/min (04/17/12 1558)   MEDICATIONS    . clopidogrel  75 mg Oral Q breakfast  . enoxaparin  40 mg Subcutaneous Q24H  . loratadine  10 mg Oral QPM  . pantoprazole  40 mg Oral Daily   PRN:  fentaNYL, hydrALAZINE, methocarbamol, oxyCODONE, RESOURCE THICKENUP CLEAR, senna-docusate, DISCONTD: food thickener  Diet:  Dysphagia 1 nectar thick liquids Activity:  OOB with assistance DVT Prophylaxis:  Lovenox 40 mg sq daily   CLINICALLY SIGNIFICANT STUDIES Basic Metabolic Panel:   Lab 04/16/12 0605 04/15/12 2118 04/15/12 1814  NA 139 -- 137  K 3.3* -- 3.4*  CL 100 --  100  CO2 29 -- 28  GLUCOSE 89 -- 102*  BUN 20 -- 22  CREATININE 1.05 1.26 --  CALCIUM 9.3 -- 9.4  MG -- -- --  PHOS -- -- --   Liver Function Tests:   Lab 04/16/12 0605 04/15/12 1814  AST 10 9  ALT 8 10  ALKPHOS 57 60  BILITOT 0.5 0.4  PROT 6.7 7.3  ALBUMIN 3.0* 3.2*   CBC:   Lab 04/16/12 0605 04/15/12 2118 04/15/12 1814  WBC 6.7 7.7 --  NEUTROABS 3.9 -- 4.8  HGB 12.8* 12.7* --  HCT 38.0* 37.2* --  MCV 94.1 94.4 --  PLT 246 256 --   Coagulation:   Lab 04/15/12 1814  LABPROT 14.7  INR 1.13   Cardiac Enzymes:   Lab 04/15/12 2118 04/15/12 1815  CKTOTAL -- 24  CKMB -- 1.6  CKMBINDEX -- --  TROPONINI <0.30 <0.30   Urinalysis: No results found for this basename: COLORURINE:2,APPERANCEUR:2,LABSPEC:2,PHURINE:2,GLUCOSEU:2,HGBUR:2,BILIRUBINUR:2,KETONESUR:2,PROTEINUR:2,UROBILINOGEN:2,NITRITE:2,LEUKOCYTESUR:2 in the last 168 hours Lipid Panel    Component Value Date/Time   CHOL 131 04/16/2012 0605   TRIG 100 04/16/2012 0605   HDL 31* 04/16/2012 0605   CHOLHDL 4.2 04/16/2012 0605   VLDL 20 04/16/2012 0605   LDLCALC 80 04/16/2012 0605   HgbA1C  Lab Results  Component Value Date   HGBA1C 5.8* 04/16/2012   Urine Drug  Screen:   No results found for this basename: labopia,  cocainscrnur,  labbenz,  amphetmu,  thcu,  labbarb    Alcohol Level: No results found for this basename: ETH:2 in the last 168 hours  CT Head  04/15/2012  No acute intracranial abnormality.  Chronic small vessel disease and widespread dilated perivascular spaces.     CT of the angio brain  04/16/2012  1.  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 2.  Mild intracranial atherosclerosis elsewhere and a degree of ICA and vertebral artery dolichoectasia, no other intracranial stenosis.  No major circle of Willis branch occlusion. 3.  Stable CT appearance of the brain.    CT of the angio neck  04/16/2012   1.  Ectasia/fusiform aneurysmal enlargement of the visualized thoracic aorta.  Ascending  aorta up to 44 mm diameter. 2.  Highly tortuous proximal great vessels diffusely.  Great vessel origin atherosclerosis without significant stenosis. 3.  Left worse than right calcified carotid bifurcation atherosclerosis, no significant stenosis on the right and 50-55% stenosis at the left ICA origin.  MRI of the brain  04/16/2012 1. 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  No mass effect or hemorrhage.  Basilar artery perforator territory. 2.  See MRA findings below. 3. Otherwise stable MRI appearance of the bran since 2011 including extensive perivascular spaces (etat crible appearance).   MRA of the brain  04/16/2012  1.  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 2.  Distal basilar remains patent. 3.  Mild to moderate distal right vertebral artery stenosis. 4.  Mild anterior circulation atherosclerosis, most apparent in the left MCA M2 branches.  2D Echocardiogram  EF 55-60% with no source of embolus.   Carotid Doppler  No internal carotid artery stenosis bilaterally. Vertebrals with antegrade flow bilaterally.   CXR  04/16/2012   1.  Atelectasis and/or consolidation left lower lobe with small left-sided pleural effusion. 2.  Large hiatal hernia again noted. 3.  Moderate cardiomegaly, unchanged. 4.  Atherosclerosis.    EKG  Sinus bradycardia with 1st degree A-V block; T wave abnormality, consider anterolateral ischemia; Prolonged QT  Physical Exam   Neurologic Exam:  Mental Status:  Alert, oriented, thought content appropriate. Speech fluent without evidence of aphasia. No significant dysarthria. Able to follow 3 step commands without difficulty.  Cranial Nerves:  II- Visual fields grossly intact.  III/IV/VI-Extraocular movements intact. Pupils reactive bilaterally.  V/VII-slight right lower facial droop.  VIII-hearing grossly intact  IX/X-uvula deviated to the right  XI-bilateral shoulder shrug intact  XII-midline tongue extension    Motor: 5/5 LUE/LLE, 4+/% RUE/RLE.normal tone and bulk, mild right pronator drift.  Sensory: Light touch intact throughout, bilaterally  Deep Tendon Reflexes: 2+ and symmetric throughout  Plantars: upgoing on right, downgoing on left.  Cerebellar: Normal finger-to-nose  Therapy Recommendations PT - CIR; OT - CIR; ST - CIR  ASSESSMENT [name] is a 78 y.o. male presenting with slurred speech and right hemiparesis.  Imaging confirms a  left pontomedullary junction brainstem infarct. Infarct felt to be thrombotic secondary to high grade proximal basilar/terminal left vertebral stenosis  Work up underway. On aspirin 81 mg orally every day prior to admission. Now on clopidogrel 75 mg orally every day for secondary stroke prevention. Patient with resultant right facial weakness, right hemiparesis. Rehab consulted.  -hyperlipidemia, LDL 80, not on statin -Permissive hypertension- off dopamine drip since yest afternoon -Mobitz type II, no intervention needed, likely related to  age, avoid AV nodal blocking agents -hypokalemia, 3.3  Hospital day # 3  TREATMENT/PLAN -Continue clopidogrel 75 mg orally every day for secondary stroke prevention. Add aspirin daily given basilar stenosis x 3 mos then one alone. -replace K -OOB. Therapy evals. -watch one more day in the ICU -Dr. Betti Cruz to assume attending role  Annie Main, MSN, RN, ANVP-BC, ANP-BC, GNP-BC [facility] Pager: [phone] 04/18/2012 8:03 AM  Scribe for Dr. Delia Heady, Stroke Center Medical Director, who has personally reviewed chart, pertinent data, examined the patient and developed the plan of care. Pager:  [phone]

## 2012-04-18 NOTE — Progress Notes (Signed)
Subjective: Feeling better today, thinks his right sided weakness is improved. Eating ok.  Objective: Vital signs in last 24 hours: Filed Vitals:   04/18/12 0300 04/18/12 0400 04/18/12 0500 04/18/12 0820  BP: 105/71 129/90 128/68   Pulse: 66 72 61   Temp:  97.5 F (36.4 C)  98.5 F (36.9 C)  TempSrc:  Oral  Oral  Resp: 10 13 10    Height:      Weight:      SpO2: 96% 97% 98%    Weight change:   Intake/Output Summary (Last 24 hours) at 04/18/12 0849 Last data filed at 04/17/12 1320  Gross per 24 hour  Intake      0 ml  Output    450 ml  Net   -450 ml    Physical Exam: General: Awake, Oriented, No acute distress. HEENT: EOMI. Neck: Supple CV: S1 and S2 Lungs: Clear to ascultation bilaterally Abdomen: Soft, Nontender, Nondistended, +bowel sounds. Ext: Good pulses. Trace edema. Neuro: Right sided weakness.   Lab Results: Basic Metabolic Panel:  Lab 04/16/12 1610 04/15/12 2118 04/15/12 1814  NA 139 -- 137  K 3.3* -- 3.4*  CL 100 -- 100  CO2 29 -- 28  GLUCOSE 89 -- 102*  BUN 20 -- 22  CREATININE 1.05 1.26 1.18  CALCIUM 9.3 -- 9.4  MG -- -- --  PHOS -- -- --   Liver Function Tests:  Lab 04/16/12 0605 04/15/12 1814  AST 10 9  ALT 8 10  ALKPHOS 57 60  BILITOT 0.5 0.4  PROT 6.7 7.3  ALBUMIN 3.0* 3.2*   No results found for this basename: LIPASE:5,AMYLASE:5 in the last 168 hours No results found for this basename: AMMONIA:5 in the last 168 hours CBC:  Lab 04/16/12 0605 04/15/12 2118 04/15/12 1814  WBC 6.7 7.7 7.7  NEUTROABS 3.9 -- 4.8  HGB 12.8* 12.7* 13.2  HCT 38.0* 37.2* 39.3  MCV 94.1 94.4 94.7  PLT 246 256 274   Cardiac Enzymes:  Lab 04/15/12 2118 04/15/12 1815  CKTOTAL -- 24  CKMB -- 1.6  CKMBINDEX -- --  TROPONINI <0.30 <0.30   BNP (last 3 results) No results found for this basename: PROBNP:3 in the last 8760 hours CBG:  Lab 04/15/12 1842  GLUCAP 85    Basename 04/16/12 0605  HGBA1C 5.8*   Other Labs: No components found with  this basename: POCBNP:3 No results found for this basename: DDIMER:2 in the last 168 hours  Lab 04/16/12 0605  CHOL 131  HDL 31*  LDLCALC 80  TRIG 960  CHOLHDL 4.2  LDLDIRECT --    Lab 04/16/12 0605  TSH 0.527  T4TOTAL --  T3FREE --  FREET4 --  THYROIDAB --   No results found for this basename: VITAMINB12:2,FOLATE:2,FERRITIN:2,TIBC:2,IRON:2,RETICCTPCT:2 in the last 168 hours  Micro Results: No results found for this or any previous visit (from the past 240 hour(s)).  Studies/Results: Ct Angio Head W/cm &/or Wo Cm  04/16/2012  *RADIOLOGY REPORT*  Clinical Data:  78 year old male with suspected severe basilar artery stenosis.  Acute left brain stem infarct.  CT ANGIOGRAPHY HEAD AND NECK  Technique:  Multidetector CT imaging of the head and neck was performed using the standard protocol during bolus administration of intravenous contrast.  Multiplanar CT image reconstructions including MIPs were obtained to evaluate the vascular anatomy. Carotid stenosis measurements (when applicable) are obtained utilizing NASCET criteria, using the distal internal carotid diameter as the denominator.  Contrast: 50mL OMNIPAQUE IOHEXOL 350 MG/ML SOLN  Comparison:  Brain MRI and MRA earlier the same day.  CTA NECK  Findings:  No superior mediastinal lymphadenopathy.  Scarring and/or atelectasis in the lungs greater on the left.  Negative thyroid, larynx, pharynx, parapharyngeal spaces, retropharyngeal space, sublingual space, submandibular and parotid glands. No cervical lymphadenopathy.  Postoperative changes to the globes. Maxillary sinus mucous retention cyst.  Other Visualized paranasal sinuses and mastoids are clear.  Mild for age cervical spine degeneration. No acute osseous abnormality identified.  Vascular Findings:  Ectatic visualized thoracic aorta, visualized ascending aorta measuring up to 44 mm diameter.  Some tapering in the aortic arch, proximal descending aorta measuring 33 mm diameter.   Scattered soft and calcified plaque at the great vessel origins.  Three-vessel arch configuration.  Diffuse tortuosity of all great vessels.  Ectatic right brachial cephalic artery and left subclavian artery.  No stenosis at the right common carotid artery origin.  Tortuous proximal right common carotid.  Confluent calcified plaque at the right ICA origin and medial bulb.  Subsequent stenosis is less than 50 % with respect to the distal vessel.  Mild ectatic and tortuous cervical right ICA otherwise.  Calcified atherosclerosis at the right vertebral artery origin resulting and no hemodynamically significant stenosis.  Tortuous proximal right vertebral artery.  Otherwise normal vertebral artery throughout the neck.  Highly tortuous proximal left common carotid artery.  Mild plaque at the origin with no stenosis.  At the left carotid bifurcation there is bulky calcified plaque extending into the left ICA origin and bulb resulting in stenosis of 58-55 % with respect to the distal vessel.  Beyond this level, tortuous but otherwise negative cervical left ICA.  Minimal plaque at the left vertebral artery origin which is normal. Tortuous proximal left vertebral artery.  Negative cervical left vertebral artery otherwise.  Intracranial findings are below.   Review of the MIP images confirms the above findings.  IMPRESSION: 1.  Ectasia/fusiform aneurysmal enlargement of the visualized thoracic aorta.  Ascending aorta up to 44 mm diameter. 2.  Highly tortuous proximal great vessels diffusely.  Great vessel origin atherosclerosis without significant stenosis. 3.  Left worse than right calcified carotid bifurcation atherosclerosis, no significant stenosis on the right and 50-55% stenosis at the left ICA origin. 4.  Intracranial findings are below.  CTA HEAD  Findings:  No acute osseous abnormality identified.  Visualized scalp soft tissues are within normal limits.  No CT changes related to the left brain stem infarct.  Dilated  perivascular spaces and other scattered white matter and deep gray matter hypodensity. No midline shift, mass effect, or evidence of mass lesion.  No acute intracranial hemorrhage identified.  No ventriculomegaly. No abnormal enhancement identified.  Vascular Findings: Major intracranial venous structures are patent.  Codominant distal vertebral arteries are patent.  Scattered calcified plaque on the left without significant distal left vertebral artery stenosis.  Soft and calcified plaque on the right without hemodynamically significant stenosis.  Normal PICA vessels. Patent vertebrobasilar junction.  Proximal basilar artery is within normal limits.  Just beyond the AICA origins there is a high-grade stenosis along a 3 mm segment, visually a near radiographic string sign, numerically 76% stenosis with respect the distal vessel (but could be numerically underestimated).  The basilar artery beyond this level remains patent.  SCA and PCA origins are patent.  Mild bilateral PCA irregularity, otherwise PCA branches are within normal limits.  Posterior communicating arteries are diminutive or absent.  Antegrade flow in both ICA siphons.  Calcified atherosclerosis is greater on  the left.  Mild ICA ectasia.  No hemodynamically significant ICA siphon stenosis.  Normal ophthalmic artery origins.  Normal carotid termini, MCA and ACA origins.  Fenestrated anterior communicating artery, otherwise within normal limits.  ACA branches are within normal limits. Bilateral MCA branches are within normal limits.   Review of the MIP images confirms the above findings.  IMPRESSION: 1.  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 2.  Mild intracranial atherosclerosis elsewhere and a degree of ICA and vertebral artery dolichoectasia, no other intracranial stenosis.  No major circle of Willis branch occlusion. 3.  Stable CT appearance of the brain.   Original Report Authenticated By: Harley Hallmark, M.D.    Ct  Angio Neck W/cm &/or Wo/cm  04/16/2012  *RADIOLOGY REPORT*  Clinical Data:  78 year old male with suspected severe basilar artery stenosis.  Acute left brain stem infarct.  CT ANGIOGRAPHY HEAD AND NECK  Technique:  Multidetector CT imaging of the head and neck was performed using the standard protocol during bolus administration of intravenous contrast.  Multiplanar CT image reconstructions including MIPs were obtained to evaluate the vascular anatomy. Carotid stenosis measurements (when applicable) are obtained utilizing NASCET criteria, using the distal internal carotid diameter as the denominator.  Contrast: 50mL OMNIPAQUE IOHEXOL 350 MG/ML SOLN  Comparison:  Brain MRI and MRA earlier the same day.  CTA NECK  Findings:  No superior mediastinal lymphadenopathy.  Scarring and/or atelectasis in the lungs greater on the left.  Negative thyroid, larynx, pharynx, parapharyngeal spaces, retropharyngeal space, sublingual space, submandibular and parotid glands. No cervical lymphadenopathy.  Postoperative changes to the globes. Maxillary sinus mucous retention cyst.  Other Visualized paranasal sinuses and mastoids are clear.  Mild for age cervical spine degeneration. No acute osseous abnormality identified.  Vascular Findings:  Ectatic visualized thoracic aorta, visualized ascending aorta measuring up to 44 mm diameter.  Some tapering in the aortic arch, proximal descending aorta measuring 33 mm diameter.  Scattered soft and calcified plaque at the great vessel origins.  Three-vessel arch configuration.  Diffuse tortuosity of all great vessels.  Ectatic right brachial cephalic artery and left subclavian artery.  No stenosis at the right common carotid artery origin.  Tortuous proximal right common carotid.  Confluent calcified plaque at the right ICA origin and medial bulb.  Subsequent stenosis is less than 50 % with respect to the distal vessel.  Mild ectatic and tortuous cervical right ICA otherwise.  Calcified  atherosclerosis at the right vertebral artery origin resulting and no hemodynamically significant stenosis.  Tortuous proximal right vertebral artery.  Otherwise normal vertebral artery throughout the neck.  Highly tortuous proximal left common carotid artery.  Mild plaque at the origin with no stenosis.  At the left carotid bifurcation there is bulky calcified plaque extending into the left ICA origin and bulb resulting in stenosis of 58-55 % with respect to the distal vessel.  Beyond this level, tortuous but otherwise negative cervical left ICA.  Minimal plaque at the left vertebral artery origin which is normal. Tortuous proximal left vertebral artery.  Negative cervical left vertebral artery otherwise.  Intracranial findings are below.   Review of the MIP images confirms the above findings.  IMPRESSION: 1.  Ectasia/fusiform aneurysmal enlargement of the visualized thoracic aorta.  Ascending aorta up to 44 mm diameter. 2.  Highly tortuous proximal great vessels diffusely.  Great vessel origin atherosclerosis without significant stenosis. 3.  Left worse than right calcified carotid bifurcation atherosclerosis, no significant stenosis on the right and 50-55%  stenosis at the left ICA origin. 4.  Intracranial findings are below.  CTA HEAD  Findings:  No acute osseous abnormality identified.  Visualized scalp soft tissues are within normal limits.  No CT changes related to the left brain stem infarct.  Dilated perivascular spaces and other scattered white matter and deep gray matter hypodensity. No midline shift, mass effect, or evidence of mass lesion.  No acute intracranial hemorrhage identified.  No ventriculomegaly. No abnormal enhancement identified.  Vascular Findings: Major intracranial venous structures are patent.  Codominant distal vertebral arteries are patent.  Scattered calcified plaque on the left without significant distal left vertebral artery stenosis.  Soft and calcified plaque on the right without  hemodynamically significant stenosis.  Normal PICA vessels. Patent vertebrobasilar junction.  Proximal basilar artery is within normal limits.  Just beyond the AICA origins there is a high-grade stenosis along a 3 mm segment, visually a near radiographic string sign, numerically 76% stenosis with respect the distal vessel (but could be numerically underestimated).  The basilar artery beyond this level remains patent.  SCA and PCA origins are patent.  Mild bilateral PCA irregularity, otherwise PCA branches are within normal limits.  Posterior communicating arteries are diminutive or absent.  Antegrade flow in both ICA siphons.  Calcified atherosclerosis is greater on the left.  Mild ICA ectasia.  No hemodynamically significant ICA siphon stenosis.  Normal ophthalmic artery origins.  Normal carotid termini, MCA and ACA origins.  Fenestrated anterior communicating artery, otherwise within normal limits.  ACA branches are within normal limits. Bilateral MCA branches are within normal limits.   Review of the MIP images confirms the above findings.  IMPRESSION: 1.  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 2.  Mild intracranial atherosclerosis elsewhere and a degree of ICA and vertebral artery dolichoectasia, no other intracranial stenosis.  No major circle of Willis branch occlusion. 3.  Stable CT appearance of the brain.   Original Report Authenticated By: Harley Hallmark, M.D.    Mr Brain Wo Contrast  04/16/2012  *RADIOLOGY REPORT*  Clinical Data:  78 year old male increased weakness greater on the right.  Comparison: Head CT without contrast 04/15/2012.  Brain MRI 90 11.  MRI HEAD WITHOUT CONTRAST  Technique: Multiplanar, multiecho pulse sequences of the brain and surrounding structures were obtained according to standard protocol without intravenous contrast.  Findings: No supratentorial restricted diffusion.  Cerebellum diffusion also within normal limits.  Confirmed on thin slice  diffusion imaging there is a left paracentral patchy diffusion abnormality at the left pontomedullary junction (series 11 image 17).  Major intracranial vascular flow voids are stable.  No midline shift, ventriculomegaly, mass effect, evidence of mass lesion, extra-axial collection or acute intracranial hemorrhage. Cervicomedullary junction and pituitary are within normal limits.  Pronounced and extensive dilated perivascular spaces throughout the deep gray matter nuclei and extending toward the corona radiata (etat crible appearance) are felt to be superimposed on some chronic lacunar infarcts. Mild for age additional patchy T2 and FLAIR hyperintensity in the cerebral white matter.  Overall signal in the supratentorial brain is stable.  No signal changes mass effect or hemorrhage in the left brain stem.  Negative visualized cervical spine normal marrow signal.  Chronic paranasal sinus mucous retention cyst are stable.  Stable orbits. Mastoids are clear.  Negative scalp soft tissues.  IMPRESSION: 1. 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  No mass effect or hemorrhage.  Basilar artery perforator territory. 2.  See MRA findings below. 3. Otherwise  stable MRI appearance of the brain since 2011 including extensive perivascular spaces (etat crible appearance).  MRA HEAD WITHOUT CONTRAST  Technique: Angiographic images of the Circle of Willis were obtained using MRA technique without  intravenous contrast.  Findings: Antegrade flow in the posterior circulation with codominant distal vertebral arteries.  Normal PICA origins.  The moderate stenosis of the distal right vertebral artery just proximal to the vertebrobasilar junction which remains patent. However, there is severe stenosis of the mid basilar artery (series 805 image 7) which does not appear to be related to technical artifact.  Despite this the distal basilar is patent.  SCA and PCA origins are within normal limits.  Posterior  communicating arteries are diminutive or absent.  Bilateral PCA branches are within normal limits.  Antegrade flow in both ICA siphons.  No ICA stenosis.  Ophthalmic artery origins are within normal limits.  Carotid termini are patent within normal limits.  MCA and ACA origins are within normal limits.  Anterior communicating artery, visualized bilateral ACA, visualized right MCA branches are within normal limits.  There is mild to moderate irregularity in the left MCA M2 branches, no major branch occlusion.  IMPRESSION: 1.  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 2.  Distal basilar remains patent. 3.  Mild to moderate distal right vertebral artery stenosis. 4.  Mild anterior circulation atherosclerosis, most apparent in the left MCA M2 branches.  Salient findings discussed by telephone with Dr. Amada Jupiter on 04/16/2012 at 1255 hours.   Original Report Authenticated By: Harley Hallmark, M.D.    Dg Swallowing Func-no Report  04/16/2012  CLINICAL DATA: CVA   FLUOROSCOPY FOR SWALLOWING FUNCTION STUDY:  Fluoroscopy was provided for swallowing function study, which was  administered by a speech pathologist.  Final results and recommendations  from this study are contained within the speech pathology report.     Mr Maxine Glenn Head/brain Wo Cm  04/16/2012  *RADIOLOGY REPORT*  Clinical Data:  78 year old male increased weakness greater on the right.  Comparison: Head CT without contrast 04/15/2012.  Brain MRI 90 11.  MRI HEAD WITHOUT CONTRAST  Technique: Multiplanar, multiecho pulse sequences of the brain and surrounding structures were obtained according to standard protocol without intravenous contrast.  Findings: No supratentorial restricted diffusion.  Cerebellum diffusion also within normal limits.  Confirmed on thin slice diffusion imaging there is a left paracentral patchy diffusion abnormality at the left pontomedullary junction (series 11 image 17).  Major intracranial vascular flow  voids are stable.  No midline shift, ventriculomegaly, mass effect, evidence of mass lesion, extra-axial collection or acute intracranial hemorrhage. Cervicomedullary junction and pituitary are within normal limits.  Pronounced and extensive dilated perivascular spaces throughout the deep gray matter nuclei and extending toward the corona radiata (etat crible appearance) are felt to be superimposed on some chronic lacunar infarcts. Mild for age additional patchy T2 and FLAIR hyperintensity in the cerebral white matter.  Overall signal in the supratentorial brain is stable.  No signal changes mass effect or hemorrhage in the left brain stem.  Negative visualized cervical spine normal marrow signal.  Chronic paranasal sinus mucous retention cyst are stable.  Stable orbits. Mastoids are clear.  Negative scalp soft tissues.  IMPRESSION: 1. 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  No mass effect or hemorrhage.  Basilar artery perforator territory. 2.  See MRA findings below. 3. Otherwise stable MRI appearance of the brain since 2011 including extensive perivascular spaces (etat crible appearance).  MRA HEAD  WITHOUT CONTRAST  Technique: Angiographic images of the Circle of Willis were obtained using MRA technique without  intravenous contrast.  Findings: Antegrade flow in the posterior circulation with codominant distal vertebral arteries.  Normal PICA origins.  The moderate stenosis of the distal right vertebral artery just proximal to the vertebrobasilar junction which remains patent. However, there is severe stenosis of the mid basilar artery (series 805 image 7) which does not appear to be related to technical artifact.  Despite this the distal basilar is patent.  SCA and PCA origins are within normal limits.  Posterior communicating arteries are diminutive or absent.  Bilateral PCA branches are within normal limits.  Antegrade flow in both ICA siphons.  No ICA stenosis.  Ophthalmic artery  origins are within normal limits.  Carotid termini are patent within normal limits.  MCA and ACA origins are within normal limits.  Anterior communicating artery, visualized bilateral ACA, visualized right MCA branches are within normal limits.  There is mild to moderate irregularity in the left MCA M2 branches, no major branch occlusion.  IMPRESSION: 1.  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 2.  Distal basilar remains patent. 3.  Mild to moderate distal right vertebral artery stenosis. 4.  Mild anterior circulation atherosclerosis, most apparent in the left MCA M2 branches.  Salient findings discussed by telephone with Dr. Amada Jupiter on 04/16/2012 at 1255 hours.   Original Report Authenticated By: Harley Hallmark, M.D.     Medications: I have reviewed the patient's current medications. Scheduled Meds:    . aspirin  81 mg Oral Daily  . clopidogrel  75 mg Oral Q breakfast  . enoxaparin  40 mg Subcutaneous Q24H  . loratadine  10 mg Oral QPM  . pantoprazole  40 mg Oral Daily  . potassium chloride  40 mEq Oral BID   Continuous Infusions:    . DOPamine 1 mcg/kg/min (04/17/12 1558)   PRN Meds:.fentaNYL, hydrALAZINE, methocarbamol, oxyCODONE, RESOURCE THICKENUP CLEAR, senna-docusate, DISCONTD: food thickener  2D ECHO on 04/16/2012 Study Conclusions - Left ventricle: The cavity size was normal. There was mild to moderateconcentric hypertrophy with disproportionate upper septal thickening. Systolic function was normal. The estimated ejection fraction was in the range of 55% to 60%. Wall motion was normal; there were no regional wall motion abnormalities. - Aortic valve: Moderately to severely calcified annulus. Mildly to moderately thickened and calcified leaflets. Cusp separation was mildly to moderately reduced. There was mild stenosis. Mild regurgitation. Valve area: 1.73cm^2(VTI). Valve area: 1.56cm^2 (Vmax). - Mitral valve: Calcified annulus. - Left atrium: The  atrium was mildly dilated. - Right ventricle: The cavity size was normal. Wall thickness was mildly increased. - Pericardium, extracardiac: A very small, free-flowing pericardial effusion was identified posterior to the heart.  Carotid dopplers on 04/17/2012 Bilaterally no significant ICA stenosis with antegrade vertebral flow.  Assessment/Plan: Right-sided weakness with slurred speech concerning for stroke MRI/MRA on 04/16/2012 showed small acute brainstem infarct at the left pontomedullary junction, MRA showed high grade stenosis of the mid basilar artery, radiographic string sign. CTA of head and neck confirmed the findings. Neurology recommended ASA 81 mg daily and Plavix 75 mg daily. Carotid dopplers on 04/17/2012 showed no significant ICA stenosis with antegrade vertebral flow bilaterally. 2D ECHO with results as indicated about. PT/OT recommending SNF. Hemoglobin A1C 5.8 on 04/16/2012. LDL 80.  Second degree AV block with possible Mobitz II Cardiology Dr. Johney Frame following, recommending conservative management, avoid any AV nodal blocking agents.   Sinus bradycardia Stable.  Check TSH normal on 04/16/2012.  Ectasia/fusiform aneurysmal enlargement of the visualized thoracic aorta Ascending aorta up to 44 mm, will need close followup as outpatient.  Hypertension Was on dopamine from 9/9 till 9/10 for SBP greater than 140. Discussed with neurology for the time being hold of any antihypertensive medications unless SBP is greater than 170s consistently.  Recent admission for T11 compression fracture  Continue pain medications. Continue small dose of fentanyl for breakthrough pain.   Hypokalemia Replace as needed.  Recent admission hypercalcemia Resolved.   Elevated PSA Outpatient management.  Prophylaxis Lovenox  Code status Full code.  Disposition Transfer to telemetry this afternoon if stable. Will need CIR versus SNF.   LOS: 3 days  Chiara Coltrin A, MD 04/18/2012, 8:49 AM

## 2012-04-18 NOTE — Progress Notes (Addendum)
Physical Therapy Treatment Patient Details Name: Christopher [name] MRN: [id number] DOB: Feb 17, 1924 Today's Date: 04/18/2012 Time: 7829-5621 PT Time Calculation (min): 38 min  PT Assessment / Plan / Recommendation Comments on Treatment Session  Pt progressing well.  Some mild coordination issues.  Dealing with the bracing will be the bigger issue.    Follow Up Recommendations  Inpatient Rehab    Barriers to Discharge        Equipment Recommendations  Defer to next venue    Recommendations for Other Services Rehab consult  Frequency Min 4X/week   Plan Discharge plan remains appropriate;Frequency remains appropriate    Precautions / Restrictions Precautions Precautions: Fall;Back Required Braces or Orthoses: Spinal Brace Spinal Brace: Thoracolumbosacral orthotic;Applied in sitting position;Applied in standing position Restrictions Weight Bearing Restrictions: No   Pertinent Vitals/Pain     Mobility  Bed Mobility Bed Mobility: Rolling Right;Right Sidelying to Sit;Sitting - Scoot to Edge of Bed Rolling Right: 4: Min guard;With rail Rolling Left: 4: Min assist Right Sidelying to Sit: HOB flat;4: Min guard;With rails Sitting - Scoot to Edge of Bed: 5: Supervision Details for Bed Mobility Assistance: VC for proper sequencing to maintain back precautions during transfer. Increased assistance through trunk and pelvis into sitting from sidelying Transfers Transfers: Sit to Stand;Stand to Sit Sit to Stand: 4: Min assist;With upper extremity assist;From bed Stand to Sit: 4: Min guard;With upper extremity assist;With armrests;To chair/3-in-1 Details for Transfer Assistance: Cues for proper technique, safety, and hand placement.  Manual and tactile cues for R LE. Ambulation/Gait Ambulation/Gait Assistance: 4: Min assist Ambulation Distance (Feet): 75 Feet Assistive device: Rolling walker Ambulation/Gait Assistance Details: generally steady with good insight on questioning where he should  stand in relation to the RW.  Short step length Gait Pattern: Step-through pattern;Decreased step length - right;Decreased step length - left;Decreased stride length (mildly stooped posture) Gait velocity: decreased Stairs: No Wheelchair Mobility Wheelchair Mobility: No Modified Rankin (Stroke Patients Only) Modified Rankin: Moderate disability    Exercises Other Exercises Other Exercises: Noted mild decreased strength RUE and mild decreased coordination RUE   PT Diagnosis:    PT Problem List:   PT Treatment Interventions:     PT Goals Acute Rehab PT Goals Time For Goal Achievement: 04/30/12 Potential to Achieve Goals: Good PT Goal: Supine/Side to Sit - Progress: Progressing toward goal PT Goal: Sit to Supine/Side - Progress: Progressing toward goal PT Goal: Sit to Stand - Progress: Progressing toward goal PT Goal: Stand to Sit - Progress: Progressing toward goal PT Transfer Goal: Bed to Chair/Chair to Bed - Progress: Progressing toward goal PT Goal: Ambulate - Progress: Progressing toward goal  Visit Information  Last PT Received On: 04/18/12 Assistance Needed: +1 PT/OT Co-Evaluation/Treatment: Yes    Subjective Data  Subjective: I'm 87 and this is the first time I've used an Neurosurgeon Patient Stated Goal: to go home   Cognition  Overall Cognitive Status: Appears within functional limits for tasks assessed/performed Arousal/Alertness: Awake/alert Orientation Level: Appears intact for tasks assessed Behavior During Session: [facility] for tasks performed    Balance  Balance Balance Assessed: No Static Standing Balance Static Standing - Balance Support: Left upper extremity supported;During functional activity Static Standing - Level of Assistance: 5: Stand by assistance Static Standing - Comment/# of Minutes: >12 minutes washing face, then shaving  End of Session PT - End of Session Equipment Utilized During Treatment: Gait belt Activity Tolerance: Patient tolerated  treatment well Patient left: in chair;with call bell/phone within reach Nurse Communication: Mobility  status   GP     Christopher [name], Christopher [name] 04/18/2012, 5:02 PM  04/18/2012  [place] Bing, PT [phone] (pager)

## 2012-04-19 ENCOUNTER — Inpatient Hospital Stay (HOSPITAL_COMMUNITY)
Admission: RE | Admit: 2012-04-19 | Discharge: 2012-05-03 | DRG: 945 | Disposition: A | Discharge status: Home Health | Payer: Medicare Other | Source: Ambulatory Visit | Attending: Physical Medicine & Rehabilitation | Admitting: Physical Medicine & Rehabilitation

## 2012-04-19 DIAGNOSIS — I6330 Cerebral infarction due to thrombosis of unspecified cerebral artery: Secondary | ICD-10-CM | POA: Diagnosis present

## 2012-04-19 DIAGNOSIS — E876 Hypokalemia: Secondary | ICD-10-CM | POA: Diagnosis present

## 2012-04-19 DIAGNOSIS — E785 Hyperlipidemia, unspecified: Secondary | ICD-10-CM | POA: Diagnosis present

## 2012-04-19 DIAGNOSIS — K219 Gastro-esophageal reflux disease without esophagitis: Secondary | ICD-10-CM | POA: Diagnosis present

## 2012-04-19 DIAGNOSIS — Z5189 Encounter for other specified aftercare: Principal | ICD-10-CM

## 2012-04-19 DIAGNOSIS — I639 Cerebral infarction, unspecified: Secondary | ICD-10-CM | POA: Diagnosis present

## 2012-04-19 DIAGNOSIS — M810 Age-related osteoporosis without current pathological fracture: Secondary | ICD-10-CM | POA: Diagnosis present

## 2012-04-19 DIAGNOSIS — I441 Atrioventricular block, second degree: Secondary | ICD-10-CM | POA: Diagnosis present

## 2012-04-19 DIAGNOSIS — I498 Other specified cardiac arrhythmias: Secondary | ICD-10-CM | POA: Diagnosis present

## 2012-04-19 DIAGNOSIS — I10 Essential (primary) hypertension: Secondary | ICD-10-CM | POA: Diagnosis present

## 2012-04-19 DIAGNOSIS — R4789 Other speech disturbances: Secondary | ICD-10-CM | POA: Diagnosis present

## 2012-04-19 DIAGNOSIS — R1310 Dysphagia, unspecified: Secondary | ICD-10-CM | POA: Diagnosis present

## 2012-04-19 DIAGNOSIS — Z87891 Personal history of nicotine dependence: Secondary | ICD-10-CM

## 2012-04-19 DIAGNOSIS — IMO0002 Reserved for concepts with insufficient information to code with codable children: Secondary | ICD-10-CM | POA: Diagnosis present

## 2012-04-19 LAB — BASIC METABOLIC PANEL
Calcium: 9.6 mg/dL (ref 8.4–10.5)
GFR calc Af Amer: 72 mL/min — ABNORMAL LOW (ref >90)
GFR calc non Af Amer: 62 mL/min — ABNORMAL LOW (ref >90)
Glucose, Bld: 96 mg/dL (ref 70–99)
Potassium: 4.1 mEq/L (ref 3.5–5.1)
Sodium: 143 mEq/L (ref 135–145)

## 2012-04-19 MED ORDER — POLYETHYLENE GLYCOL 3350 17 G PO PACK
17.0000 g | PACK | Freq: Every day | ORAL | Status: DC | PRN
  Administered 2012-05-02: 17 g via ORAL
  Filled 2012-04-19: qty 1

## 2012-04-19 MED ORDER — ASPIRIN EC 81 MG PO TBEC
81.0000 mg | DELAYED_RELEASE_TABLET | Freq: Every day | ORAL | Status: DC
  Administered 2012-04-20 – 2012-05-03 (×14): 81 mg via ORAL
  Filled 2012-04-19 (×16): qty 1

## 2012-04-19 MED ORDER — OXYCODONE HCL 5 MG PO TABS
5.0000 mg | ORAL_TABLET | ORAL | Status: DC | PRN
  Administered 2012-04-19 – 2012-05-03 (×49): 5 mg via ORAL
  Filled 2012-04-19 (×49): qty 1

## 2012-04-19 MED ORDER — ASPIRIN EC 81 MG PO TBEC
81.0000 mg | DELAYED_RELEASE_TABLET | Freq: Every day | ORAL | Status: DC
  Administered 2012-04-19: 81 mg via ORAL
  Filled 2012-04-19: qty 1

## 2012-04-19 MED ORDER — ENOXAPARIN SODIUM 40 MG/0.4ML ~~LOC~~ SOLN
40.0000 mg | SUBCUTANEOUS | Status: DC
  Administered 2012-04-19 – 2012-04-28 (×10): 40 mg via SUBCUTANEOUS
  Filled 2012-04-19 (×11): qty 0.4

## 2012-04-19 MED ORDER — SORBITOL 70 % SOLN: 30.0000 mL | Freq: Every day | Status: DC | PRN

## 2012-04-19 MED ORDER — CLOPIDOGREL BISULFATE 75 MG PO TABS
75.0000 mg | ORAL_TABLET | Freq: Every day | ORAL | Status: DC
  Administered 2012-04-20 – 2012-05-03 (×14): 75 mg via ORAL
  Filled 2012-04-19 (×16): qty 1

## 2012-04-19 MED ORDER — PANTOPRAZOLE SODIUM 40 MG PO TBEC
40.0000 mg | DELAYED_RELEASE_TABLET | Freq: Every day | ORAL | Status: DC
  Administered 2012-04-20 – 2012-05-03 (×14): 40 mg via ORAL
  Filled 2012-04-19 (×13): qty 1

## 2012-04-19 MED ORDER — BIOTENE DRY MOUTH MT LIQD: 15.0000 mL | Freq: Two times a day (BID) | OROMUCOSAL | Status: DC

## 2012-04-19 MED ORDER — ONDANSETRON HCL 4 MG PO TABS: 4.0000 mg | ORAL_TABLET | Freq: Four times a day (QID) | ORAL | Status: DC | PRN

## 2012-04-19 MED ORDER — INFLUENZA VIRUS VACC SPLIT PF IM SUSP
0.5000 mL | INTRAMUSCULAR | Status: AC
  Administered 2012-04-20: 0.5 mL via INTRAMUSCULAR
  Filled 2012-04-19: qty 0.5

## 2012-04-19 MED ORDER — LORATADINE 10 MG PO TABS
10.0000 mg | ORAL_TABLET | Freq: Every evening | ORAL | Status: DC
  Administered 2012-04-20 – 2012-05-01 (×12): 10 mg via ORAL
  Filled 2012-04-19 (×15): qty 1

## 2012-04-19 MED ORDER — DICLOFENAC SODIUM 1 % TD GEL
1.0000 "application " | Freq: Four times a day (QID) | TRANSDERMAL | Status: DC
  Administered 2012-04-20 – 2012-05-03 (×50): 1 via TOPICAL
  Filled 2012-04-19: qty 100

## 2012-04-19 MED ORDER — METHOCARBAMOL 500 MG PO TABS
500.0000 mg | ORAL_TABLET | Freq: Three times a day (TID) | ORAL | Status: DC | PRN
  Administered 2012-04-20 – 2012-04-25 (×7): 500 mg via ORAL
  Filled 2012-04-19 (×7): qty 1

## 2012-04-19 MED ORDER — RESOURCE THICKENUP CLEAR PO POWD: ORAL | Status: DC | PRN

## 2012-04-19 MED ORDER — ACETAMINOPHEN 325 MG PO TABS
325.0000 mg | ORAL_TABLET | ORAL | Status: DC | PRN
  Administered 2012-04-22 – 2012-04-30 (×8): 650 mg via ORAL
  Filled 2012-04-19 (×9): qty 2

## 2012-04-19 MED ORDER — ONDANSETRON HCL 4 MG/2ML IJ SOLN: 4.0000 mg | Freq: Four times a day (QID) | INTRAMUSCULAR | Status: DC | PRN

## 2012-04-19 MED ORDER — POTASSIUM CHLORIDE CRYS ER 20 MEQ PO TBCR
20.0000 meq | EXTENDED_RELEASE_TABLET | Freq: Every day | ORAL | Status: DC
  Administered 2012-04-20 – 2012-05-03 (×14): 20 meq via ORAL
  Filled 2012-04-19 (×17): qty 1

## 2012-04-19 NOTE — Progress Notes (Signed)
Received call from Dr Rito Ehrlich reporting that pt ready to admit to CIR today. Pt's CM and RN aware. Please call for questions: [phone].

## 2012-04-19 NOTE — PMR Pre-admission (Signed)
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 Date: 04/10/91     Deduct: $1184      Out of Pocket Max: none      Life Max: unlimited CIR: 100%      SNF: 100 days Outpatient: 80%     Co-Pay: 20% Home Health: 100%      Co-Pay: 0 DME: 80%     Co-Pay: 20% Providers: Patient's choice  Emergency Contact Information Contact Information    Name Relation Home Work Mobile   Lewis,[name] Daughter [phone]     Elizer, Szymborski [phone]       Current Medical History  Patient Admitting Diagnosis: Left ponto-medullary infarct with right hemiparesis  History of Present Illness: 78 y.o. right-handed male with history of hypertension as well as hyperlipidemia and chronic back pain with associated vertebral fractures. Patient recently discharged from the hospital with a T.-11 compression fracture. Admitted 04/15/2012 with right side weakness and slurred speech. 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 Patient did not receive TPA. Echocardiogram with ejection fraction of 60% and no regional wall motion abnormalities. Carotid Dopplers were pending. Neurology services consulted placed on Plavix therapy for CVA prophylaxis as well as subcutaneous Lovenox. Bouts of orthostatic hypotension received fluid bolus and monitored. Speech therapy followup maintain on a dysphagia 1 nectar thick liquid diet. Physical and occupational therapy ongoing with recommendations of physical medicine rehabilitation consult to consider inpatient rehabilitation services.   Total: 2 =NIH   Past Medical History  Past Medical History    Diagnosis Date  . GERD (gastroesophageal reflux disease) 08/1996  . Hyperlipemia 08/1986  . Hypertension Pre 1996  . Osteoporosis 12/2004  . Pneumonia 22 yoa  . History of Doppler echocardiogram 05/1995    MVP, MOD AI, MILD TR, MILD MR  . Elevated PSA     decided against further eval  . Diverticulosis   . Bilateral sensorineural hearing loss 02/2012    some conductive left side  . Compression fracture     multiple  . AV block, 2nd degree 04/2012   Family History  family history includes Diabetes in his sister; Heart disease in his mother; Hypertension in his mother and sisters; and Stroke in his father, mother, and sister.  There is no history of Cancer.  Prior Rehab/Hospitalizations: none  Current Medications  Current facility-administered medications:aspirin EC tablet 81 mg, 81 mg, Oral, Daily, Osvaldo Shipper, MD;  atropine injection 0.4 mg, 0.4 mg, Intravenous, Once PRN, Cristal Ford, MD;  clopidogrel (PLAVIX) tablet 75 mg, 75 mg, Oral, Q breakfast, Cristal Ford, MD, 75 mg at 04/19/12 0807;  enoxaparin (LOVENOX) injection 40 mg, 40 mg, Subcutaneous, Q24H, Eduard Clos, MD, 40 mg at 04/18/12 2142 fentaNYL (SUBLIMAZE) injection 12.5 mcg, 12.5 mcg, Intravenous, Q4H PRN, Eduard Clos, MD, 12.5 mcg at 04/16/12 1712;  hydrALAZINE (APRESOLINE) injection 10 mg, 10 mg, Intravenous, Q4H PRN, Eduard Clos, MD;  loratadine (CLARITIN) tablet 10 mg, 10 mg, Oral, QPM, Eduard Clos, MD, 10 mg at 04/18/12 1704;  methocarbamol (ROBAXIN) tablet 500 mg, 500 mg, Oral, Q8H PRN, Ali Lowe  Claiborne Billings, NP, 500 mg at 04/16/12 1403 oxyCODONE (Oxy IR/ROXICODONE) immediate release tablet 5 mg, 5 mg, Oral, Q4H PRN, Eduard Clos, MD, 5 mg at 04/19/12 0609;  pantoprazole (PROTONIX) EC tablet 40 mg, 40 mg, Oral, Daily, Eduard Clos, MD, 40 mg at 04/18/12 1030;  potassium chloride SA (K-DUR,KLOR-CON) CR tablet 40 mEq, 40 mEq, Oral, BID, Layne Benton, NP, 40 mEq at 04/18/12  2241;  RESOURCE THICKENUP CLEAR, , Oral, PRN, Breck Coons Litaker, CCC-SLP senna-docusate (Senokot-S) tablet 1 tablet, 1 tablet, Oral, QHS PRN, Eduard Clos, MD;  DISCONTD: aspirin chewable tablet 81 mg, 81 mg, Oral, Daily, Cristal Ford, MD, 81 mg at 04/18/12 1030;  DISCONTD: DOPamine (INTROPIN) 800 mg in dextrose 5 % 250 mL infusion, 2-20 mcg/kg/min, Intravenous, Continuous, Layne Benton, NP, Last Rate: 1.4 mL/hr at 04/17/12 1558, 1 mcg/kg/min at 04/17/12 1558  Patients Current Diet: Dysphagia  Precautions / Restrictions Precautions Precautions: Fall;Back Precaution Comments: Pt had difficulty recalling the 3 back precautions.  Needed cueing for safety Spinal Brace: Thoracolumbosacral orthotic;Applied in sitting position;Applied in standing position Restrictions Weight Bearing Restrictions: No   Prior Activity Level Community (5-7x/wk): Very active daily Home Assistive Devices / Equipment Home Assistive Devices/Equipment: Dan Humphreys (specify type) Home Adaptive Equipment: Walker - rolling;Bedside commode/3-in-1  Prior Functional Level Level of Independence: Independent Able to Take Stairs?: Yes Driving: Yes Vocation: Retired Comments: Very physically active  Current Functional Level Cognition  Arousal/Alertness: Awake/alert Overall Cognitive Status: Impaired Overall Cognitive Status: Appears within functional limits for tasks assessed/performed Current Attention Level: Selective Attention - Other Comments: difficulty with maintaining attention for >10 seconds on one task Orientation Level: Oriented X4 Safety/Judgement: Decreased awareness of safety precautions;Decreased safety judgement for tasks assessed Attention: Alternating Memory: Impaired Memory Impairment: Decreased short term memory;Storage deficit;Decreased recall of new information Awareness: Appears intact Problem Solving: Appears intact Safety/Judgment: Appears intact    Extremity Assessment (includes  Sensation/Coordination)  RUE ROM/Strength/Tone: Deficits RUE ROM/Strength/Tone Deficits: 3+/5 throughout RUE Sensation: WFL - Light Touch;WFL - Proprioception  RLE ROM/Strength/Tone: Deficits RLE ROM/Strength/Tone Deficits: grossly 3+-4/5 RLE Sensation: WFL - Light Touch    ADLs  Grooming: Performed;Shaving;Wash/dry face (Min guard A wash face, Mod A to finish shaving) Where Assessed - Grooming: Supported standing;Supported sitting (standing for face, 1/2 and 1/2 for shaving) Upper Body Dressing: Performed;Set up Where Assessed - Upper Body Dressing: Unsupported sitting Lower Body Dressing: Performed;+1 Total assistance Where Assessed - Lower Body Dressing: Unsupported sitting Toilet Transfer: Simulated;Minimal assistance Toilet Transfer: Patient Percentage: 60% Toilet Transfer Method: Sit to stand Toilet Transfer Equipment:  (Bed, out into hallway, stood at sink grooming, to recliner) Equipment Used: Rolling walker;Back brace;Gait belt Transfers/Ambulation Related to ADLs: +2 for lines and tubes ADL Comments: Pt has back brace (described as corset by pt) at home. Asked family member to bring in back brace.     Mobility  Bed Mobility: Rolling Right;Right Sidelying to Sit;Sitting - Scoot to Edge of Bed Rolling Right: 4: Min guard;With rail Rolling Left: 4: Min assist Right Sidelying to Sit: HOB flat;4: Min guard;With rails Left Sidelying to Sit: 3: Mod assist;With rails Sitting - Scoot to Edge of Bed: 5: Supervision    Transfers  Transfers: Sit to Stand;Stand to Sit Sit to Stand: 4: Min assist;With upper extremity assist;From bed Sit to Stand: Patient Percentage: 70% Stand to Sit: 4: Min guard;With upper extremity assist;With armrests;To chair/3-in-1 Stand to Sit: Patient Percentage: 70%    Ambulation / Gait / Stairs / Wheelchair Mobility  Ambulation/Gait Ambulation/Gait  Assistance: 4: Min assist Ambulation/Gait: Patient Percentage: 70% Ambulation Distance (Feet): 75  Feet Assistive device: Rolling walker Ambulation/Gait Assistance Details: generally steady with good insight on questioning where he should stand in relation to the RW.  Short step length Gait Pattern: Step-through pattern;Decreased step length - right;Decreased step length - left;Decreased stride length (mildly stooped posture) Gait velocity: decreased Stairs: No Wheelchair Mobility Wheelchair Mobility: No    Posture / Holiday representative Standing - Balance Support: Left upper extremity supported;During functional activity Static Standing - Level of Assistance: 5: Stand by assistance Static Standing - Comment/# of Minutes: >12 minutes washing face, then shaving     Previous Home Environment Living Arrangements: Alone Lives With: Alone Available Help at Discharge: Family Type of Home: House Home Layout: One level Home Access: Level entry Bathroom Shower/Tub: Tub/shower unit;Curtain Firefighter: Standard Home Care Services: No  Discharge Living Setting Plans for Discharge Living Setting: Patient's home;Alone;House Type of Home at Discharge: House Discharge Home Layout: One level Do you have any problems obtaining your medications?: No  Social/Family/Support Systems Patient Roles: Parent (Widowed x 4 yrs) Contact Information: Son and daughter Anticipated Caregiver: Daughter & Son:  Anticipated Caregiver's Contact Information: Gillian Scarce: [phone] / Shela Nevin: [phone] Ability/Limitations of Caregiver: none Caregiver Availability: Intermittent Discharge Plan Discussed with Primary Caregiver: Yes Is Caregiver In Agreement with Plan?: Yes Does Caregiver/Family have Issues with Lodging/Transportation while Pt is in Rehab?: No  Goals/Additional Needs Patient/Family Goal for Rehab: Mod I: PT,OT & ST Expected length of stay: 2 weeks Cultural Considerations: none Dietary Needs: Dys-1 Equipment Needs: TBD Pt/Family Agrees to Admission and willing to  participate: Yes Program Orientation Provided & Reviewed with Pt/Caregiver Including Roles  & Responsibilities: Yes  Patient Condition: Please see physician update to information in consult dated 04/17/12.  Preadmission Screen Completed By:  Meryl Dare, 04/19/2012 9:45 AM ______________________________________________________________________   Discussed status with Dr. Riley Kill on 04/19/12 at 11:43 AM and received telephone approval for admission today.  Admission Coordinator:  Meryl Dare, time11:43 AM / Date 04/19/12

## 2012-04-19 NOTE — Progress Notes (Signed)
Stroke Team Progress Note  HISTORY Christopher [name] is an 78 y.o. male with a history of hypertension and hyperlipidemia as well as a history of chronic back pain associated with vertebral fractures, presenting with new-onset slurred speech and weakness involving his right arm and leg. He was last known normal at about 4 AM 04/15/2012. He noticed difficulty moving his right arm and right leg when he woke up at about 7 AM. Strength improved. Family members noted however that his face was asymmetrical with speech was slurred. He also had inability to walk, including with use of his walker. CT scan showed no acute intracranial abnormality. Patient has been on aspirin 81 mg per day. Is no previous history stroke nor TIA. NIH stroke score was 4. Patient was not a TPA candidate secondary to delay in arriva. He was admitted to the neuro ICU for further evaluation and treatment.  SUBJECTIVE CIR admissions coordinator at bedside. Plans for discharge there today.  OBJECTIVE Most recent Vital Signs: Filed Vitals:   04/18/12 1800 04/18/12 1949 04/19/12 0300 04/19/12 0541  BP: 149/69 136/72 128/63 132/68  Pulse: 76 76 77 76  Temp:  98.9 F (37.2 C) 98.6 F (37 C) 98.6 F (37 C)  TempSrc:  Oral Oral Oral  Resp: 20 19 18 18   Height:  6' (1.829 m)    Weight:  82.555 kg (182 lb)    SpO2: 96% 98% 100% 100%   Intake/Output from previous day: 09/10 0701 - 09/11 0700 In: 340 [P.O.:340] Out: 475 [Urine:475]  MEDICATIONS     . aspirin EC  81 mg Oral Daily  . clopidogrel  75 mg Oral Q breakfast  . enoxaparin  40 mg Subcutaneous Q24H  . loratadine  10 mg Oral QPM  . pantoprazole  40 mg Oral Daily  . potassium chloride  40 mEq Oral BID  . DISCONTD: aspirin  81 mg Oral Daily   PRN:  atropine, fentaNYL, hydrALAZINE, methocarbamol, oxyCODONE, RESOURCE THICKENUP CLEAR, senna-docusate  Diet:  Dysphagia 1 nectar thick liquids Activity:  OOB with assistance DVT Prophylaxis:  Lovenox 40 mg sq daily   CLINICALLY  SIGNIFICANT STUDIES Basic Metabolic Panel:   Lab 04/19/12 0555 04/18/12 1040  NA 143 138  K 4.1 3.4*  CL 107 103  CO2 26 26  GLUCOSE 96 133*  BUN 28* 29*  CREATININE 1.06 1.01  CALCIUM 9.6 9.5  MG -- --  PHOS -- --   Liver Function Tests:   Lab 04/16/12 0605 04/15/12 1814  AST 10 9  ALT 8 10  ALKPHOS 57 60  BILITOT 0.5 0.4  PROT 6.7 7.3  ALBUMIN 3.0* 3.2*   CBC:   Lab 04/16/12 0605 04/15/12 2118 04/15/12 1814  WBC 6.7 7.7 --  NEUTROABS 3.9 -- 4.8  HGB 12.8* 12.7* --  HCT 38.0* 37.2* --  MCV 94.1 94.4 --  PLT 246 256 --   Coagulation:   Lab 04/15/12 1814  LABPROT 14.7  INR 1.13   Cardiac Enzymes:   Lab 04/15/12 2118 04/15/12 1815  CKTOTAL -- 24  CKMB -- 1.6  CKMBINDEX -- --  TROPONINI <0.30 <0.30   Lipid Panel    Component Value Date/Time   CHOL 131 04/16/2012 0605   TRIG 100 04/16/2012 0605   HDL 31* 04/16/2012 0605   CHOLHDL 4.2 04/16/2012 0605   VLDL 20 04/16/2012 0605   LDLCALC 80 04/16/2012 0605   HgbA1C  Lab Results  Component Value Date   HGBA1C 5.8* 04/16/2012   CT  Head  04/15/2012  No acute intracranial abnormality.  Chronic small vessel disease and widespread dilated perivascular spaces.     CT of the angio brain  04/16/2012  1.  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 2.  Mild intracranial atherosclerosis elsewhere and a degree of ICA and vertebral artery dolichoectasia, no other intracranial stenosis.  No major circle of Willis branch occlusion. 3.  Stable CT appearance of the brain.    CT of the angio neck  04/16/2012   1.  Ectasia/fusiform aneurysmal enlargement of the visualized thoracic aorta.  Ascending aorta up to 44 mm diameter. 2.  Highly tortuous proximal great vessels diffusely.  Great vessel origin atherosclerosis without significant stenosis. 3.  Left worse than right calcified carotid bifurcation atherosclerosis, no significant stenosis on the right and 50-55% stenosis at the left ICA origin.  MRI of the brain   04/16/2012 1. 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  No mass effect or hemorrhage.  Basilar artery perforator territory. 2.  See MRA findings below. 3. Otherwise stable MRI appearance of the bran since 2011 including extensive perivascular spaces (etat crible appearance).   MRA of the brain  04/16/2012  1.  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 2.  Distal basilar remains patent. 3.  Mild to moderate distal right vertebral artery stenosis. 4.  Mild anterior circulation atherosclerosis, most apparent in the left MCA M2 branches.  2D Echocardiogram  EF 55-60% with no source of embolus.   Carotid Doppler  No internal carotid artery stenosis bilaterally. Vertebrals with antegrade flow bilaterally.   CXR  04/16/2012   1.  Atelectasis and/or consolidation left lower lobe with small left-sided pleural effusion. 2.  Large hiatal hernia again noted. 3.  Moderate cardiomegaly, unchanged. 4.  Atherosclerosis.    EKG  Sinus bradycardia with 1st degree A-V block; T wave abnormality, consider anterolateral ischemia; Prolonged QT  Physical Exam   Neurologic Exam:  Mental Status:  Alert, oriented, thought content appropriate. Speech fluent without evidence of aphasia. No significant dysarthria. Able to follow 3 step commands without difficulty.  Cranial Nerves:  II- Visual fields grossly intact.  III/IV/VI-Extraocular movements intact. Pupils reactive bilaterally.  V/VII-slight right lower facial droop.  VIII-hearing grossly intact  IX/X-uvula deviated to the right  XI-bilateral shoulder shrug intact  XII-midline tongue extension  Motor: 5/5 LUE/LLE, 4+/% RUE/RLE.normal tone and bulk, mild right pronator drift.  Sensory: Light touch intact throughout, bilaterally  Deep Tendon Reflexes: 2+ and symmetric throughout  Plantars: upgoing on right, downgoing on left.  Cerebellar: Normal finger-to-nose  Therapy Recommendations PT - CIR; OT - CIR; ST -  CIR  ASSESSMENT [name] is a 78 y.o. male presenting with slurred speech and right hemiparesis.  Imaging confirms a  left pontomedullary junction brainstem infarct. Infarct felt to be thrombotic secondary to high grade proximal basilar/terminal left vertebral stenosis  Work up completed. On aspirin 81 mg orally every day prior to admission. Now on aspirin 81 mg orally every day and clopidogrel 75 mg orally every day for secondary stroke prevention. Patient with resultant right facial weakness, right hemiparesis. Rehab consulted with plans for discharge there today.  -hyperlipidemia, LDL 80, not on statin -Permissive hypertension- off dopamine drip since yest afternoon -Mobitz type II, no intervention needed, likely related to age, avoid AV nodal blocking agents -hypokalemia, 3.3  Hospital day # 4  TREATMENT/PLAN -Continue aspirin 81 mg orally every day and clopidogrel 75 mg orally  every day for secondary stroke prevention  x 3 mos for basilar stenosis then one alone. -agree with plans for discharge to rehab today -Stroke Service will sign off. Follow up with Dr. Pearlean Brownie, Stroke Clinic, in 2 months.    Annie Main, MSN, RN, ANVP-BC, ANP-BC, [facility] Pager: [phone] 04/19/2012 9:43 AM  Scribe for Dr. Delia Heady, Stroke Center Medical Director, who has personally reviewed chart, pertinent data, examined the patient and developed the plan of care. Pager:  [phone]

## 2012-04-19 NOTE — Progress Notes (Signed)
   SUBJECTIVE: The patient is doing well today.  At this time, he denies chest pain, shortness of breath, or any new concerns.  Making slow recovery from his stroke.     Marland Kitchen aspirin  81 mg Oral Daily  . clopidogrel  75 mg Oral Q breakfast  . enoxaparin  40 mg Subcutaneous Q24H  . loratadine  10 mg Oral QPM  . pantoprazole  40 mg Oral Daily  . potassium chloride  40 mEq Oral BID      . DOPamine 1 mcg/kg/min (04/17/12 1558)    OBJECTIVE: Physical Exam: Filed Vitals:   04/18/12 1800 04/18/12 1949 04/19/12 0300 04/19/12 0541  BP: 149/69 136/72 128/63 132/68  Pulse: 76 76 77 76  Temp:  98.9 F (37.2 C) 98.6 F (37 C) 98.6 F (37 C)  TempSrc:  Oral Oral Oral  Resp: 20 19 18 18   Height:  6' (1.829 m)    Weight:  182 lb (82.555 kg)    SpO2: 96% 98% 100% 100%    Intake/Output Summary (Last 24 hours) at 04/19/12 0837 Last data filed at 04/18/12 1700  Gross per 24 hour  Intake    340 ml  Output    475 ml  Net   -135 ml    Telemetry reveals sinus rhythm with rare second degree AV block (improved), sinus bradycardia/ junctional rhythm also observed though heart rates are typically preserved  GEN- The patient is well appearing, alert and oriented x 3 today.   Head- normocephalic, atraumatic Eyes-  Sclera clear, conjunctiva pink Ears- hearing intact Oropharynx- clear Lungs- Clear to ausculation bilaterally, normal work of breathing Heart- Regular rate and rhythm, no murmurs, rubs or gallops, PMI not laterally displaced GI- soft, NT, ND, + BS Extremities- no clubbing, cyanosis, or edema   LABS: Basic Metabolic Panel:  Basename 04/19/12 0555 04/18/12 1040  NA 143 138  K 4.1 3.4*  CL 107 103  CO2 26 26  GLUCOSE 96 133*  BUN 28* 29*  CREATININE 1.06 1.01  CALCIUM 9.6 9.5  MG -- --  PHOS -- --   ASSESSMENT AND PLAN:  Principal Problem:  *Right sided weakness Active Problems:  HYPERLIPIDEMIA  HYPERTENSION  Compression fracture  Bradycardia  Hypokalemia  Second  degree AV block  Hypotension  1. Second degree AV block/ sinus bradycardia Telemetry is very stable overnight. Avoid AV nodal blocking agents.  Conservative managmenet  2. HTN- stable   3. CVA- per primary team/ neurology   We will see as needed going forward,  Please call with questions Please call with questions.   Hillis Range, MD 04/19/2012 8:37 AM

## 2012-04-19 NOTE — H&P (View-Only) (Signed)
Physical Medicine and Rehabilitation Admission H&P    Chief Complaint  Patient presents with  . Weakness  : HPI: Christopher [name] is a 78 y.o. right-handed male with history of hypertension as well as hyperlipidemia and chronic back pain with associated vertebral fractures. Patient recently discharged from the hospital with a T.-11 compression fracture. Admitted 04/15/2012 with right side weakness and slurred speech. 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 Patient did not receive TPA. Echocardiogram with ejection fraction of 60% and no regional wall motion abnormalities. Carotid Dopplers showed no ICA stenosis. Neurology services consulted placed on Plavix therapy as well as aspirin for CVA prophylaxis as well as subcutaneous Lovenox for DVT prophylaxis. Bouts of orthostatic hypotension received fluid bolus and monitored. Cardiology services consulted 04/17/2012 Dr. Johney Frame in reference to telemetry monitor showing episodes of AV block. It was felt that this episodic AV block represented advanced degeneration of his conduction system at some point in the future possibly may need a pacemaker. He was advised conservative care for now and monitor from a distance as patient was asymptomatic. Speech therapy followup maintain on a dysphagia 1 nectar thick liquid diet. Patient continues to wear a back brace from recent thoracic compression fracture that could be applied in the sitting position. Physical and occupational therapy ongoing with recommendations of physical medicine rehabilitation consult to consider inpatient rehabilitation services. Patient was felt to be due to candidate for inpatient rehabilitation services and was admitted for [facility]  Review of Systems  Gastrointestinal: Positive for constipation.  Musculoskeletal: Positive for myalgias and  back pain.  Neurological: Positive for focal weakness and weakness.  All other systems reviewed and are negative   Past Medical History  Diagnosis Date  . GERD (gastroesophageal reflux disease) 08/1996  . Hyperlipemia 08/1986  . Hypertension Pre 1996  . Osteoporosis 12/2004  . Pneumonia 22 yoa  . History of Doppler echocardiogram 05/1995    MVP, MOD AI, MILD TR, MILD MR  . Elevated PSA     decided against further eval  . Diverticulosis   . Bilateral sensorineural hearing loss 02/2012    some conductive left side  . Compression fracture     multiple  . AV block, 2nd degree 04/2012   Past Surgical History  Procedure Date  . Cholecystectomy 05/1995  . Exercise treadmill 09/12/2006    NML  . Inguinal hernia repair 08/14/2010    Dr. Daphine Deutscher  . Esophagogastroduodenoscopy 08/18/2004    [facility]. Dr. Russella Dar  . Colonoscopy 05/01/2007    divericulosis, int hemorrhoids - Dr. Russella Dar   Family History  Problem Relation Age of Onset  . Heart disease Mother     MI  . Hypertension Mother   . Stroke Mother     ?  . Stroke Father   . Diabetes Sister   . Hypertension Sister   . Hypertension Sister   . Hypertension Sister   . Stroke Sister     recurrent mini strokes  . Cancer Neg Hx    Social History:  reports that he has quit smoking. He has quit using smokeless tobacco. He reports that he does not drink alcohol or use illicit drugs. Allergies: No Known Allergies Medications Prior to Admission  Medication Sig Dispense Refill  . acetaminophen (TYLENOL) 500 MG tablet Take 1,000 mg by mouth every 6 (six) hours as needed. For pain      .  alendronate (FOSAMAX) 70 MG tablet Take 70 mg by mouth every 7 (seven) days. Take with a full glass of water on an empty stomach.      Marland Kitchen aspirin EC 81 MG tablet Take 81 mg by mouth daily.      . Cholecalciferol (VITAMIN D) 1000 UNITS capsule Take 1,000 Units by mouth every evening.       . Emollient (EUCERIN CALMING DAILY MOIST) CREA Apply topically. Apply  four times a day to affected area       . esomeprazole (NEXIUM) 40 MG capsule Take 40 mg by mouth daily before breakfast.      . Flaxseed, Linseed, (FLAX SEED OIL) 1000 MG CAPS Take 1 capsule by mouth every evening.       Marland Kitchen ibuprofen (ADVIL,MOTRIN) 200 MG tablet Take 200 mg by mouth every 6 (six) hours as needed. For pain.      Marland Kitchen lisinopril-hydrochlorothiazide (PRINZIDE,ZESTORETIC) 20-25 MG per tablet Take 1 tablet by mouth daily.      Marland Kitchen loratadine (CLARITIN) 10 MG tablet Take 10 mg by mouth every evening.       Marland Kitchen oxyCODONE (OXY IR/ROXICODONE) 5 MG immediate release tablet Take 5 mg by mouth every 4 (four) hours as needed. For pain.      . potassium chloride SA (K-DUR,KLOR-CON) 20 MEQ tablet Take 20 mEq by mouth 2 (two) times daily.        Home: Home Living Lives With: Alone Available Help at Discharge: Family Type of Home: House Home Access: Level entry Home Layout: One level Bathroom Shower/Tub: Forensic scientist: Standard Home Adaptive Equipment: Walker - rolling;Bedside commode/3-in-1   Functional History: Prior Function Dressing: Moderate Vocation: Retired Comments: Independent prior to recent T11 compression fx. Since fx, pt has had family member stop by intermittently during day to assist with ADLs. Has been sponge bathing since T11 fx.  Functional Status:  Mobility: Bed Mobility Bed Mobility: Rolling Right;Right Sidelying to Sit;Sitting - Scoot to Edge of Bed Rolling Right: 4: Min guard;With rail Rolling Left: 4: Min assist Right Sidelying to Sit: HOB flat;4: Min guard;With rails Left Sidelying to Sit: 3: Mod assist;With rails Sitting - Scoot to Edge of Bed: 5: Supervision Transfers Transfers: Sit to Stand;Stand to Sit Sit to Stand: 4: Min assist;With upper extremity assist;From bed Sit to Stand: Patient Percentage: 70% Stand to Sit: 4: Min guard;With upper extremity assist;With armrests;To chair/3-in-1 Stand to Sit: Patient Percentage:  70% Ambulation/Gait Ambulation/Gait Assistance: 4: Min assist Ambulation/Gait: Patient Percentage: 70% Ambulation Distance (Feet): 75 Feet Assistive device: Rolling walker Ambulation/Gait Assistance Details: generally steady with good insight on questioning where he should stand in relation to the RW.  Short step length Gait Pattern: Step-through pattern;Decreased step length - right;Decreased step length - left;Decreased stride length (mildly stooped posture) Gait velocity: decreased Stairs: No Wheelchair Mobility Wheelchair Mobility: No  ADL: ADL Grooming: Performed;Shaving;Wash/dry face (Min guard A wash face, Mod A to finish shaving) Where Assessed - Grooming: Supported standing;Supported sitting (standing for face, 1/2 and 1/2 for shaving) Upper Body Dressing: Performed;Set up Where Assessed - Upper Body Dressing: Unsupported sitting Lower Body Dressing: Performed;+1 Total assistance Where Assessed - Lower Body Dressing: Unsupported sitting Toilet Transfer: Simulated;Minimal assistance Toilet Transfer Method: Sit to stand Toilet Transfer Equipment:  (Bed, out into hallway, stood at sink grooming, to recliner) Equipment Used: Rolling walker;Back brace;Gait belt Transfers/Ambulation Related to ADLs: +2 for lines and tubes ADL Comments: Pt has back brace (described as corset by pt) at home. Asked  family member to bring in back brace.   Cognition: Cognition Overall Cognitive Status: Impaired Arousal/Alertness: Awake/alert Orientation Level: Oriented X4 Attention: Alternating Memory: Impaired Memory Impairment: Decreased short term memory;Storage deficit;Decreased recall of new information Awareness: Appears intact Problem Solving: Appears intact Safety/Judgment: Appears intact Cognition Overall Cognitive Status: Appears within functional limits for tasks assessed/performed Area of Impairment: Safety/judgement Arousal/Alertness: Awake/alert Orientation Level: Appears intact  for tasks assessed Behavior During Session: [facility] for tasks performed Current Attention Level: Selective Attention - Other Comments: difficulty with maintaining attention for >10 seconds on one task Safety/Judgement: Decreased awareness of safety precautions;Decreased safety judgement for tasks assessed   Blood pressure 132/68, pulse 76, temperature 98.6 F (37 C), temperature source Oral, resp. rate 18, height 6' (1.829 m), weight 82.555 kg (182 lb), SpO2 100.00%. Physical Exam  Vitals reviewed.  Constitutional: He appears well-developed.  HENT: missing multiple teeth, poor dentition, lower partial dentures Head: Normocephalic.  Eyes:  Pupils round and reactive to light  Neck: Normal range of motion. Neck supple. No thyromegaly present.  Cardiovascular:  Cardiac rate is controlled  Pulmonary/Chest: Breath sounds normal. No respiratory distress.  Abdominal: Bowel sounds are normal. He exhibits no distension. There is no tenderness.  Musculoskeletal: He exhibits no edema. Right wrist slightly tender and mild swelling noted. No redness or warmth, mid back tender  Neurological: He is alert.  Patient was able to give appropriate age date of birth in year. He followed three-step commands. Cognitively quite appropriate. Voice a little hoarse Right central 7 and tongue deviation, speech slightly slurred but intelligible. Strength 4/5 RUE, 3+ to4 RLE. No gross sensory changes. Positive right pronator drift. Decreased FTN RUE. Skin: Skin is warm and dry.  Psychiatric: He has a normal mood and affect. His behavior is normal. Judgment and thought content normal   Results for orders placed during the hospital encounter of 04/15/12 (from the past 48 hour(s))  BASIC METABOLIC PANEL     Status: Abnormal   Collection Time   04/18/12 10:40 AM      Component Value Range Comment   Sodium 138  135 - 145 mEq/L    Potassium 3.4 (*) 3.5 - 5.1 mEq/L    Chloride 103  96 - 112 mEq/L    CO2 26  19 - 32 mEq/L     Glucose, Bld 133 (*) 70 - 99 mg/dL    BUN 29 (*) 6 - 23 mg/dL    Creatinine, Ser 1.61  0.50 - 1.35 mg/dL    Calcium 9.5  8.4 - 09.6 mg/dL    GFR calc non Af Amer 66 (*) >90 mL/min    GFR calc Af Amer 76 (*) >90 mL/min   BASIC METABOLIC PANEL     Status: Abnormal   Collection Time   04/19/12  5:55 AM      Component Value Range Comment   Sodium 143  135 - 145 mEq/L    Potassium 4.1  3.5 - 5.1 mEq/L DELTA CHECK NOTED   Chloride 107  96 - 112 mEq/L    CO2 26  19 - 32 mEq/L    Glucose, Bld 96  70 - 99 mg/dL    BUN 28 (*) 6 - 23 mg/dL    Creatinine, Ser 0.45  0.50 - 1.35 mg/dL    Calcium 9.6  8.4 - 40.9 mg/dL    GFR calc non Af Amer 62 (*) >90 mL/min    GFR calc Af Amer 72 (*) >90 mL/min    No results  found.  Post Admission Physician Evaluation: 1. Functional deficits secondary  to left pontomedullary infarct, recent T11 comp fx. 2. Patient is admitted to receive collaborative, interdisciplinary care between the physiatrist, rehab nursing staff, and therapy team. 3. Patient's level of medical complexity and substantial therapy needs in context of that medical necessity cannot be provided at a lesser intensity of care such as a SNF. 4. Patient has experienced substantial functional loss from his/her baseline which was documented above under the "Functional History" and "Functional Status" headings.  Judging by the patient's diagnosis, physical exam, and functional history, the patient has potential for functional progress which will result in measurable gains while on inpatient rehab.  These gains will be of substantial and practical use upon discharge  in facilitating mobility and self-care at the household level. 5. Physiatrist will provide 24 hour management of medical needs as well as oversight of the therapy plan/treatment and provide guidance as appropriate regarding the interaction of the two. 6. 24 hour rehab nursing will assist with bladder management, bowel management, safety,  skin/wound care, disease management, medication administration, pain management and patient education  and help integrate therapy concepts, techniques,education, etc. 7. PT will assess and treat for:  fxnl mobility, pain mgt, back precautions, NMR, adaptive equipment.  Goals are: mod I to supervision. 8. OT will assess and treat for: UES, ADL's, NMR, fxnl mobility, don doff of brace, adaptive equipment.   Goals are: mod I to minimal assist. 9. SLP will assess and treat for: speech, communication, swallow.  Goals are: mod I. 10. Case Management and Social Worker will assess and treat for psychological issues and discharge planning. 11. Team conference will be held weekly to assess progress toward goals and to determine barriers to discharge. 12. Patient will receive at least 3 hours of therapy per day at least 5 days per week. 13. ELOS: 10 days      Prognosis:  excellent   Medical Problem List and Plan: 1. 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 DVT Prophylaxis/Anticoagulation: Subcutaneous Lovenox. Monitor platelet counts any signs of bleeding 3. Pain Management: Oxycodone and Robaxin as needed. Monitor the increased mobility  -add voltaren gel to right wrist, ? Mild OA  -back brace 4. Neuropsych: This patient is capable of making decisions on his/her own behalf. 5. Recent thoracic T11 compression fracture. Conservative care with a back brace when out of bed 6. Hypertension/second degree AV block/sinus bradycardia. Telemetry monitor has been stable. Followup per cardiology services with conservative care. Avoid AV nodal blocking agents. Monitor for any signs of orthostasis 7. Dysphagia. Dysphagia 1 nectar liquid diet. Followup per speech therapy. Monitor for any signs of aspiration 8. GERD. Protonix  04/19/2012, Ivory Broad, MD

## 2012-04-19 NOTE — Progress Notes (Signed)
Physical Therapy Treatment Patient Details Name: Christopher [name] MRN: [id number] DOB: Nov 26, 1923 Today's Date: 04/19/2012 Time: 8469-6295 PT Time Calculation (min): 24 min  PT Assessment / Plan / Recommendation Comments on Treatment Session  Patient progressing well. Complained of shortness of breathe with increase dyspnea entering room. Patient educated on pursed-lipped breathing and stated that SOB improved with ambulation and sitting up. Patient stated that MD stated he could don/dof brace in sitting. Will need MD to clarify. Patient progressing very well and is motivated to progress with therapy    Follow Up Recommendations  Inpatient Rehab    Barriers to Discharge        Equipment Recommendations  Defer to next venue    Recommendations for Other Services    Frequency Min 4X/week   Plan Discharge plan remains appropriate;Frequency remains appropriate    Precautions / Restrictions Precautions Precautions: Back Precaution Comments: Patient ablet o recall 2/3 precautions. Reeducated on no bending Required Braces or Orthoses: Spinal Brace Spinal Brace: Thoracolumbosacral orthotic   Pertinent Vitals/Pain     Mobility  Bed Mobility Rolling Right: 4: Min assist Rolling Left: 4: Min assist Right Sidelying to Sit: 4: Min assist;With rails Details for Bed Mobility Assistance: Patient able to roll ~90% of way needing assistance for full roll to don brace. A with shoulders up into sitting position Transfers Sit to Stand: 4: Min assist;With upper extremity assist;From bed;From chair/3-in-1 Stand to Sit: 4: Min assist;With upper extremity assist;To chair/3-in-1 Details for Transfer Assistance: Patient able to stand x2 with Min A for initiattion of stand and for safety. Cues for safe hand placement and to sit slowly Ambulation/Gait Ambulation/Gait Assistance: 4: Min assist Ambulation Distance (Feet): 150 Feet (one seated rest break) Assistive device: Rolling walker Ambulation/Gait  Assistance Details: Cues and A for safety with management of RW and cues for upright posture.  Gait Pattern: Step-through pattern;Decreased stride length;Trunk flexed Gait velocity: decreased Modified Rankin (Stroke Patients Only) Pre-Morbid Rankin Score: Moderate disability Modified Rankin: Moderate disability    Exercises     PT Diagnosis:    PT Problem List:   PT Treatment Interventions:     PT Goals Acute Rehab PT Goals PT Goal: Supine/Side to Sit - Progress: Progressing toward goal PT Goal: Sit to Stand - Progress: Progressing toward goal PT Goal: Stand to Sit - Progress: Progressing toward goal PT Transfer Goal: Bed to Chair/Chair to Bed - Progress: Progressing toward goal PT Goal: Ambulate - Progress: Met  Visit Information  Last PT Received On: 04/19/12 Assistance Needed: +1    Subjective Data      Cognition  Overall Cognitive Status: Appears within functional limits for tasks assessed/performed Arousal/Alertness: Awake/alert Orientation Level: Appears intact for tasks assessed Behavior During Session: [facility] for tasks performed    Balance     End of Session PT - End of Session Equipment Utilized During Treatment: Gait belt;Back brace Activity Tolerance: Patient tolerated treatment well Patient left: in chair;with call bell/phone within reach Nurse Communication: Mobility status   GP     Fredrich Birks 04/19/2012, 11:38 AM 04/19/2012 Fredrich Birks PTA [phone] pager [phone] office

## 2012-04-19 NOTE — Plan of Care (Addendum)
Overall Plan of Care [facility]) Patient Details Name: Christopher [name] MRN: [id number] DOB: Nov 08, 1923  Diagnosis:  Rehabilitation for stroke  Primary Diagnosis:    CVA (cerebral infarction) Co-morbidities: GERD (gastroesophageal reflux disease)  08/1996  .  Hyperlipemia  08/1986  .  Hypertension  Pre 1996  .  Osteoporosis  12/2004  .  Pneumonia  22 yoa  .  History of Doppler echocardiogram  05/1995  MVP, MOD AI, MILD TR, MILD MR  .  Elevated PSA  decided against further eval  .  Diverticulosis  .  Bilateral sensorineural hearing loss  02/2012  some conductive left side  .  Compression fracture  multiple  .  AV block, 2nd degree    Functional Problem List  Patient demonstrates impairments in the following areas: Balance, Bladder, Bowel, Endurance, Medication Management, Motor, Nutrition, Pain and Safety  Basic ADL's: bathing, dressing and toileting Advanced ADL's: n/a at this time  Transfers:  bed mobility, bed to chair, car and furniture Locomotion:  ambulation and stairs  Additional Impairments:  Swallowing, Communication  expression and Social Cognition   memory  Anticipated Outcomes Item Anticipated Outcome  Eating/Swallowing  Supervision   Basic self-care  supervision  Tolieting  Mod I  Bowel/Bladder  Continent mod independent  Transfers  Modified independent basic, supervision car  Locomotion  Modified independent ambulation x 150' controlled, 50' home environment  Communication  Supervision-Min assist  Cognition  Supervision   Pain  3 or less 1-10 scale   Safety/Judgment  Supervision   Other  Skin: no breakdown, min assist with breakdown prevention interventions    Therapy Plan: PT Frequency: 1-2 X/day, 60-90 minutes;5 out of 7 days OT Frequency: 1-2 X/day, 60-90 minutes;5 out of 7 days  SLP Frequency: 1-2 X/day, 60-90 minutes;5 out of 7 days  Team Interventions: Item RN PT OT SLP SW TR Other  Self Care/Advanced ADL Retraining   x       Neuromuscular Re-Education  x x      Therapeutic Activities  x x x     UE/LE Strength Training/ROM  x x      UE/LE Coordination Activities  x x      Visual/Perceptual Remediation/Compensation         DME/Adaptive Equipment Instruction  x x      Therapeutic Exercise  x x x     Balance/Vestibular Training  x x      Patient/Family Education x x x x     Cognitive Remediation/Compensation    x     Functional Mobility Training  x x      Ambulation/Gait Training  x       Stair Training  x       Wheelchair Propulsion/Positioning  x       Functional Child psychotherapist    x     Speech/Language Facilitation    x     Bladder Management x        Bowel Management x        Disease Management/Prevention x        Pain Management x        Medication Management x        Skin Care/Wound Management x        Splinting/Orthotics  x x      Discharge Planning  x  Psychosocial Support                            Team Discharge Planning: Destination:  Home Projected Follow-up:  PT, OT and SLP Home Health Projected Equipment Needs:  Tub Bench and Walker Patient/family involved in discharge planning:  Yes  MD ELOS: 2 weeks Medical Rehab Prognosis:  Good Assessment: 78 year old male admitted for CVA now requiring CIR level PT, OT, SLP, 24 7 rehabilitation RN and M.D.

## 2012-04-19 NOTE — Progress Notes (Signed)
Patient admitted to 4036 at 1630. Daughter accompanied. Patient alert and oriented x4. Reviewed rehab schedule, call bell system, safety plan. Patient and daughter verbalized understanding. Christopher [name]

## 2012-04-19 NOTE — Interval H&P Note (Signed)
Christopher [name] was admitted today to Inpatient Rehabilitation with the diagnosis of thrombotic left pontomedullary infarct, T11 compression fx.  The patient's history has been reviewed, patient examined, and there is no change in status.  Patient continues to be appropriate for intensive inpatient rehabilitation.  I have reviewed the patient's chart and labs.  Questions were answered to the patient's satisfaction.  [name] T 04/19/2012, 4:56 PM

## 2012-04-19 NOTE — Discharge Summary (Addendum)
Physician Discharge Summary  Patient ID: Christopher [name] MRN: [id number] DOB/AGE: [birth year] 78 y.o.  Admit date: 04/15/2012 Discharge date: 04/19/2012  PCP: Eustaquio Boyden, MD  DISCHARGE DIAGNOSES:  Principal Problem:  *Acute ischemic stroke Active Problems:  HYPERLIPIDEMIA  HYPERTENSION  Compression fracture  Right sided weakness  Bradycardia  Hypokalemia  Second degree AV block  Hypotension   RECOMMENDATIONS TO PCP: 1. Follow up for vertebral fracture. 2. Has aneurysmal enlargement of thoracic aorta. Please follow.  DISCHARGE CONDITION: fair  INITIAL HISTORY: 78 year old male who was recently discharged in August after being treated for T11 compression fracture with history of hypertension elevated PSA started noticing weakness in the right upper and lower extremity when he woke up on day of admission at around 7 AM. This weakness persisted and later noticed slurred speech and also right facial droop. Since the symptoms persisted he called his family around 7 PM and EMS was called patient was brought to the ER. CT head did not show any acute changes. Patient was admitted for further workup for possible CVA. Patient was not a candidate for TPA because patient was out of the window. Neurologist was consulted. In addition patient had been having constant back pain from his recent T11 fracture. Otherwise denied any chest pain shortness of breath. Still had slurred speech and also noticed some difficulty swallowing his pills. Denied any visual symptoms.   HOSPITAL COURSE:   Right-sided weakness with slurred speech concerning for stroke  MRI/MRA on 04/16/2012 showed small acute brainstem infarct at the left pontomedullary junction, MRA showed high grade stenosis of the mid basilar artery, radiographic string sign. CTA of head and neck confirmed the findings. Neurology recommended ASA 81 mg daily for 3 months and Plavix 75 mg daily. Carotid dopplers on 04/17/2012 showed no significant  ICA stenosis with antegrade vertebral flow bilaterally. 2D ECHO with results as indicated below. PT/OT recommending CIR. Hemoglobin A1C 5.8 on 04/16/2012. LDL 80. He has been accepted at [facility] and has a bed available today.  Second degree AV block with possible Mobitz II  Cardiology Dr. Johney Frame following, recommending conservative management, avoid any AV nodal blocking agents. Pacemaker at future date if need arises. I have discussed this with Dr. Johney Frame.  Sinus bradycardia  Stable. TSH normal on 04/16/2012.   Ectasia/fusiform aneurysmal enlargement of the visualized thoracic aorta  Ascending aorta up to 44 mm, will need close followup as outpatient.   Hypertension  Was on dopamine from 9/9 till 9/10 for SBP greater than 140. Discussed with neurology for the time being hold of any antihypertensive medications unless SBP is greater than 170s consistently. Patient blood pressure is stable. He will not require any further antihypertensive treatment at this time. If blood pressure rises greater than 170 and remains high consistently please initiate low-dose antihypertensive treatment. Please avoid AV nodal agents.  Recent admission for T11 compression fracture  Continue pain medications. His symptoms are better compared to 2 weeks ago, when he had his acute fracture. At this time. We would continue recommending physical and occupational therapy. If he has persistent pain at 4-6 weeks' time and if the pain is severe. Her vertebroplasty can be considered. He was seen by neurosurgeon (Dr. Othelia Pulling) during the previous hospitalization.  Recent admission hypercalcemia  Resolved. SPEP was unremarkable.  Elevated PSA  Outpatient management.   Code status  Full code.   Overall patient remained stable. His telemetry does not show any significant or symptomatic bradycardia. AV nodal agents to be avoided. He is  stable for transfer to Inpatient rehabilitation at this time.  PERTINENT LABS:  2D ECHO on  04/16/2012  Study Conclusions - Left ventricle: The cavity size was normal. There was mild to moderateconcentric hypertrophy with disproportionate upper septal thickening. Systolic function was normal. The estimated ejection fraction was in the range of 55% to 60%. Wall motion was normal; there were no regional wall motion abnormalities. - Aortic valve: Moderately to severely calcified annulus. Mildly to moderately thickened and calcified leaflets. Cusp separation was mildly to moderately reduced. There was mild stenosis. Mild regurgitation. Valve area: 1.73cm^2(VTI). Valve area: 1.56cm^2 (Vmax). - Mitral valve: Calcified annulus. - Left atrium: The atrium was mildly dilated. - Right ventricle: The cavity size was normal. Wall thickness was mildly increased. - Pericardium, extracardiac: A very small, free-flowing pericardial effusion was identified posterior to the heart.  IMAGING STUDIES Ct Angio Head W/cm &/or Wo Cm  04/16/2012  *RADIOLOGY REPORT*  Clinical Data:  78 year old male with suspected severe basilar artery stenosis.  Acute left brain stem infarct.  CT ANGIOGRAPHY HEAD AND NECK  Technique:  Multidetector CT imaging of the head and neck was performed using the standard protocol during bolus administration of intravenous contrast.  Multiplanar CT image reconstructions including MIPs were obtained to evaluate the vascular anatomy. Carotid stenosis measurements (when applicable) are obtained utilizing NASCET criteria, using the distal internal carotid diameter as the denominator.  Contrast: 50mL OMNIPAQUE IOHEXOL 350 MG/ML SOLN  Comparison:  Brain MRI and MRA earlier the same day.  CTA NECK  Findings:  No superior mediastinal lymphadenopathy.  Scarring and/or atelectasis in the lungs greater on the left.  Negative thyroid, larynx, pharynx, parapharyngeal spaces, retropharyngeal space, sublingual space, submandibular and parotid glands. No cervical lymphadenopathy.  Postoperative changes to the  globes. Maxillary sinus mucous retention cyst.  Other Visualized paranasal sinuses and mastoids are clear.  Mild for age cervical spine degeneration. No acute osseous abnormality identified.  Vascular Findings:  Ectatic visualized thoracic aorta, visualized ascending aorta measuring up to 44 mm diameter.  Some tapering in the aortic arch, proximal descending aorta measuring 33 mm diameter.  Scattered soft and calcified plaque at the great vessel origins.  Three-vessel arch configuration.  Diffuse tortuosity of all great vessels.  Ectatic right brachial cephalic artery and left subclavian artery.  No stenosis at the right common carotid artery origin.  Tortuous proximal right common carotid.  Confluent calcified plaque at the right ICA origin and medial bulb.  Subsequent stenosis is less than 50 % with respect to the distal vessel.  Mild ectatic and tortuous cervical right ICA otherwise.  Calcified atherosclerosis at the right vertebral artery origin resulting and no hemodynamically significant stenosis.  Tortuous proximal right vertebral artery.  Otherwise normal vertebral artery throughout the neck.  Highly tortuous proximal left common carotid artery.  Mild plaque at the origin with no stenosis.  At the left carotid bifurcation there is bulky calcified plaque extending into the left ICA origin and bulb resulting in stenosis of 58-55 % with respect to the distal vessel.  Beyond this level, tortuous but otherwise negative cervical left ICA.  Minimal plaque at the left vertebral artery origin which is normal. Tortuous proximal left vertebral artery.  Negative cervical left vertebral artery otherwise.  Intracranial findings are below.   Review of the MIP images confirms the above findings.  IMPRESSION: 1.  Ectasia/fusiform aneurysmal enlargement of the visualized thoracic aorta.  Ascending aorta up to 44 mm diameter. 2.  Highly tortuous proximal great vessels diffusely.  Great vessel origin atherosclerosis without  significant stenosis. 3.  Left worse than right calcified carotid bifurcation atherosclerosis, no significant stenosis on the right and 50-55% stenosis at the left ICA origin. 4.  Intracranial findings are below.  CTA HEAD  Findings:  No acute osseous abnormality identified.  Visualized scalp soft tissues are within normal limits.  No CT changes related to the left brain stem infarct.  Dilated perivascular spaces and other scattered white matter and deep gray matter hypodensity. No midline shift, mass effect, or evidence of mass lesion.  No acute intracranial hemorrhage identified.  No ventriculomegaly. No abnormal enhancement identified.  Vascular Findings: Major intracranial venous structures are patent.  Codominant distal vertebral arteries are patent.  Scattered calcified plaque on the left without significant distal left vertebral artery stenosis.  Soft and calcified plaque on the right without hemodynamically significant stenosis.  Normal PICA vessels. Patent vertebrobasilar junction.  Proximal basilar artery is within normal limits.  Just beyond the AICA origins there is a high-grade stenosis along a 3 mm segment, visually a near radiographic string sign, numerically 76% stenosis with respect the distal vessel (but could be numerically underestimated).  The basilar artery beyond this level remains patent.  SCA and PCA origins are patent.  Mild bilateral PCA irregularity, otherwise PCA branches are within normal limits.  Posterior communicating arteries are diminutive or absent.  Antegrade flow in both ICA siphons.  Calcified atherosclerosis is greater on the left.  Mild ICA ectasia.  No hemodynamically significant ICA siphon stenosis.  Normal ophthalmic artery origins.  Normal carotid termini, MCA and ACA origins.  Fenestrated anterior communicating artery, otherwise within normal limits.  ACA branches are within normal limits. Bilateral MCA branches are within normal limits.   Review of the MIP images  confirms the above findings.  IMPRESSION: 1.  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 2.  Mild intracranial atherosclerosis elsewhere and a degree of ICA and vertebral artery dolichoectasia, no other intracranial stenosis.  No major circle of Willis branch occlusion. 3.  Stable CT appearance of the brain.   Original Report Authenticated By: Harley Hallmark, M.D.    Dg Chest 2 View  04/16/2012  *RADIOLOGY REPORT*  Clinical Data: Stroke.  Recent T11 fracture.  Back pain.  CHEST - 2 VIEW  Comparison: Chest x-ray 04/02/2012.  Findings: There is a retrocardiac opacity on the left, opacities in the left base may represent areas of atelectasis and/or consolidation, with superimposed small left-sided pleural effusion. Large hiatal hernia.  Right lung is clear.  No evidence of pulmonary edema.  Moderate cardiomegaly is unchanged.  Mediastinal contours are slightly distorted by patient rotation to the right and lordotic positioning, but similar to priors.  Atherosclerosis in the thoracic aorta.  IMPRESSION: 1.  Atelectasis and/or consolidation left lower lobe with small left-sided pleural effusion. 2.  Large hiatal hernia again noted. 3.  Moderate cardiomegaly, unchanged. 4.  Atherosclerosis.   Original Report Authenticated By: Florencia Reasons, M.D.    Ct Head Wo Contrast  04/15/2012  *RADIOLOGY REPORT*  Clinical Data: 78 year old male with weakness.  CT HEAD WITHOUT CONTRAST  Technique:  Contiguous axial images were obtained from the base of the skull through the vertex without contrast.  Comparison: Head CT of brain MRI 04/16/2010.  Findings: Left maxillary sinus mucous retention cyst.  Other Visualized paranasal sinuses and mastoids are clear.  No acute osseous abnormality identified.  Visualized orbits and scalp soft tissues are within normal limits.  Calcified atherosclerosis at  the skull base.  Widespread chronic lacunar infarcts in the deep gray matter nuclei.  Similar appearance on  the prior; by MRI some of these are dilated perivascular spaces.  No ventriculomegaly. No midline shift, mass effect, or evidence of mass lesion.  No acute intracranial hemorrhage identified.   Scattered other cerebral white matter hypodensity also in part related to perivascular spaces. No evidence of cortically based acute infarction identified.  No suspicious intracranial vascular hyperdensity.  IMPRESSION: No acute intracranial abnormality.  Chronic small vessel disease and widespread dilated perivascular spaces.   Original Report Authenticated By: Harley Hallmark, M.D.    Ct Angio Neck W/cm &/or Wo/cm  04/16/2012  *RADIOLOGY REPORT*  Clinical Data:  78 year old male with suspected severe basilar artery stenosis.  Acute left brain stem infarct.  CT ANGIOGRAPHY HEAD AND NECK  Technique:  Multidetector CT imaging of the head and neck was performed using the standard protocol during bolus administration of intravenous contrast.  Multiplanar CT image reconstructions including MIPs were obtained to evaluate the vascular anatomy. Carotid stenosis measurements (when applicable) are obtained utilizing NASCET criteria, using the distal internal carotid diameter as the denominator.  Contrast: 50mL OMNIPAQUE IOHEXOL 350 MG/ML SOLN  Comparison:  Brain MRI and MRA earlier the same day.  CTA NECK  Findings:  No superior mediastinal lymphadenopathy.  Scarring and/or atelectasis in the lungs greater on the left.  Negative thyroid, larynx, pharynx, parapharyngeal spaces, retropharyngeal space, sublingual space, submandibular and parotid glands. No cervical lymphadenopathy.  Postoperative changes to the globes. Maxillary sinus mucous retention cyst.  Other Visualized paranasal sinuses and mastoids are clear.  Mild for age cervical spine degeneration. No acute osseous abnormality identified.  Vascular Findings:  Ectatic visualized thoracic aorta, visualized ascending aorta measuring up to 44 mm diameter.  Some tapering in the  aortic arch, proximal descending aorta measuring 33 mm diameter.  Scattered soft and calcified plaque at the great vessel origins.  Three-vessel arch configuration.  Diffuse tortuosity of all great vessels.  Ectatic right brachial cephalic artery and left subclavian artery.  No stenosis at the right common carotid artery origin.  Tortuous proximal right common carotid.  Confluent calcified plaque at the right ICA origin and medial bulb.  Subsequent stenosis is less than 50 % with respect to the distal vessel.  Mild ectatic and tortuous cervical right ICA otherwise.  Calcified atherosclerosis at the right vertebral artery origin resulting and no hemodynamically significant stenosis.  Tortuous proximal right vertebral artery.  Otherwise normal vertebral artery throughout the neck.  Highly tortuous proximal left common carotid artery.  Mild plaque at the origin with no stenosis.  At the left carotid bifurcation there is bulky calcified plaque extending into the left ICA origin and bulb resulting in stenosis of 58-55 % with respect to the distal vessel.  Beyond this level, tortuous but otherwise negative cervical left ICA.  Minimal plaque at the left vertebral artery origin which is normal. Tortuous proximal left vertebral artery.  Negative cervical left vertebral artery otherwise.  Intracranial findings are below.   Review of the MIP images confirms the above findings.  IMPRESSION: 1.  Ectasia/fusiform aneurysmal enlargement of the visualized thoracic aorta.  Ascending aorta up to 44 mm diameter. 2.  Highly tortuous proximal great vessels diffusely.  Great vessel origin atherosclerosis without significant stenosis. 3.  Left worse than right calcified carotid bifurcation atherosclerosis, no significant stenosis on the right and 50-55% stenosis at the left ICA origin. 4.  Intracranial findings are below.  CTA HEAD  Findings:  No acute osseous abnormality identified.  Visualized scalp soft tissues are within normal  limits.  No CT changes related to the left brain stem infarct.  Dilated perivascular spaces and other scattered white matter and deep gray matter hypodensity. No midline shift, mass effect, or evidence of mass lesion.  No acute intracranial hemorrhage identified.  No ventriculomegaly. No abnormal enhancement identified.  Vascular Findings: Major intracranial venous structures are patent.  Codominant distal vertebral arteries are patent.  Scattered calcified plaque on the left without significant distal left vertebral artery stenosis.  Soft and calcified plaque on the right without hemodynamically significant stenosis.  Normal PICA vessels. Patent vertebrobasilar junction.  Proximal basilar artery is within normal limits.  Just beyond the AICA origins there is a high-grade stenosis along a 3 mm segment, visually a near radiographic string sign, numerically 76% stenosis with respect the distal vessel (but could be numerically underestimated).  The basilar artery beyond this level remains patent.  SCA and PCA origins are patent.  Mild bilateral PCA irregularity, otherwise PCA branches are within normal limits.  Posterior communicating arteries are diminutive or absent.  Antegrade flow in both ICA siphons.  Calcified atherosclerosis is greater on the left.  Mild ICA ectasia.  No hemodynamically significant ICA siphon stenosis.  Normal ophthalmic artery origins.  Normal carotid termini, MCA and ACA origins.  Fenestrated anterior communicating artery, otherwise within normal limits.  ACA branches are within normal limits. Bilateral MCA branches are within normal limits.   Review of the MIP images confirms the above findings.  IMPRESSION: 1.  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 2.  Mild intracranial atherosclerosis elsewhere and a degree of ICA and vertebral artery dolichoectasia, no other intracranial stenosis.  No major circle of Willis branch occlusion. 3.  Stable CT appearance of the  brain.   Original Report Authenticated By: Harley Hallmark, M.D.    Mr Brain Wo Contrast  04/16/2012  *RADIOLOGY REPORT*  Clinical Data:  78 year old male increased weakness greater on the right.  Comparison: Head CT without contrast 04/15/2012.  Brain MRI 90 11.  MRI HEAD WITHOUT CONTRAST  Technique: Multiplanar, multiecho pulse sequences of the brain and surrounding structures were obtained according to standard protocol without intravenous contrast.  Findings: No supratentorial restricted diffusion.  Cerebellum diffusion also within normal limits.  Confirmed on thin slice diffusion imaging there is a left paracentral patchy diffusion abnormality at the left pontomedullary junction (series 11 image 17).  Major intracranial vascular flow voids are stable.  No midline shift, ventriculomegaly, mass effect, evidence of mass lesion, extra-axial collection or acute intracranial hemorrhage. Cervicomedullary junction and pituitary are within normal limits.  Pronounced and extensive dilated perivascular spaces throughout the deep gray matter nuclei and extending toward the corona radiata (etat crible appearance) are felt to be superimposed on some chronic lacunar infarcts. Mild for age additional patchy T2 and FLAIR hyperintensity in the cerebral white matter.  Overall signal in the supratentorial brain is stable.  No signal changes mass effect or hemorrhage in the left brain stem.  Negative visualized cervical spine normal marrow signal.  Chronic paranasal sinus mucous retention cyst are stable.  Stable orbits. Mastoids are clear.  Negative scalp soft tissues.  IMPRESSION: 1. 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  No mass effect or hemorrhage.  Basilar artery perforator territory. 2.  See MRA findings below. 3. Otherwise stable MRI appearance of the brain since 2011 including extensive perivascular spaces (etat crible appearance).  MRA HEAD WITHOUT CONTRAST  Technique: Angiographic images of  the Circle of Willis were obtained using MRA technique without  intravenous contrast.  Findings: Antegrade flow in the posterior circulation with codominant distal vertebral arteries.  Normal PICA origins.  The moderate stenosis of the distal right vertebral artery just proximal to the vertebrobasilar junction which remains patent. However, there is severe stenosis of the mid basilar artery (series 805 image 7) which does not appear to be related to technical artifact.  Despite this the distal basilar is patent.  SCA and PCA origins are within normal limits.  Posterior communicating arteries are diminutive or absent.  Bilateral PCA branches are within normal limits.  Antegrade flow in both ICA siphons.  No ICA stenosis.  Ophthalmic artery origins are within normal limits.  Carotid termini are patent within normal limits.  MCA and ACA origins are within normal limits.  Anterior communicating artery, visualized bilateral ACA, visualized right MCA branches are within normal limits.  There is mild to moderate irregularity in the left MCA M2 branches, no major branch occlusion.  IMPRESSION: 1.  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 2.  Distal basilar remains patent. 3.  Mild to moderate distal right vertebral artery stenosis. 4.  Mild anterior circulation atherosclerosis, most apparent in the left MCA M2 branches.  Salient findings discussed by telephone with Dr. Amada Jupiter on 04/16/2012 at 1255 hours.   Original Report Authenticated By: Harley Hallmark, M.D.    Mr Thoracic Spine Wo Contrast  04/10/2012  **ADDENDUM** CREATED: 04/10/2012 17:27:03  Original report read by Dr. Alfredo Batty.  Addendum by Dr. Purcell Mouton. These examinations were reviewed at the request of Dr. Rosalia Hammers on 04/10/2012. In the findings from the thoracic spine, the acute/subacute superior endplate compression fracture is present at T11 (not L1). In the impression, this should read "acute/subacute superior endplate compression  fracture at T11 without significant retropulsion of bone". The lumbar spine findings and impression appear correct.  **END ADDENDUM** SIGNED BY: Gerrianne Scale, M.D.   04/03/2012  *RADIOLOGY REPORT*  Clinical Data:  Severe low back pain and weakness.  MRI THORACIC AND LUMBAR SPINE WITHOUT CONTRAST  Technique:  Multiplanar and multiecho pulse sequences of the thoracic and lumbar spine were obtained without intravenous contrast.  Comparison:  CT of the abdomen and pelvis 04/02/2012.  MRI THORACIC SPINE  Findings: Normal signal is present throughout the thoracic spinal cord.  An acute / subacute superior endplate compression fracture is present at L1.  The vertebral body height is reduced to 17 mm compared 22 mm at the T10 level.  Edematous changes are evident. An anterior wedge compression fracture at T7 is remote.  No significant retropulsion of bone is present at either level. Facet hypertrophy results in mild osseous foraminal narrowing at T9- 10 and T10-11 bilaterally.  Bibasilar atelectasis is present, right greater than left.  There is aneurysmal dilation of the descending thoracic aorta measuring 3.6 cm in short access.  IMPRESSION:  1.  No acute / subacute superior endplate compression fracture at T11 without significant retropulsion of bone. 2.  Mild osseous foraminal narrowing at T9-10 and T10-11 bilaterally secondary to facet hypertrophy. 3.  Remote anterior endplate compression fracture of T7 with exaggerated kyphosis. 4.  No significant disc herniation or stenosis elsewhere. 5.  Descending thoracic aortic aneurysm.  MRI LUMBAR SPINE  Findings: Five lumbar-type vertebral bodies are present.  Leftward curvature lumbar spine is centered at L2-3.  A remote vertebral plana compression fracture at L1  is stable.  Remote fractures are noted at L2 and L3.  There is some loss of height involving the L4 and L5 vertebral bodies as well.  The acute / subacute T11 superior endplate compression fracture is again  noted.  There is some edema or hemorrhage anterior to the T11 vertebral body.  No other acute fracture is present.  T12-L1:  No significant disc herniation or stenosis is present.  L1-2:  A mild rightward disc bulge is present without significant stenosis.  The conus medullaris terminates posterior to L1 without significant stenosis.  L2-3:  A broad-based disc herniation is asymmetric to the right. Asymmetric facet hypertrophy is evident.  This results in mild right lateral recess narrowing.  There is some encroachment on the right neural foramen without significant stenosis.  L3-4:  A mild broad-based disc bulge is present.  The disc bulges into the inferior recess of the right lateral foramen without significant stenosis.  L4-5:  A mild broad-based disc bulge is present.  Mild facet hypertrophy is evident bilaterally.  This results in some encroachment on the neural foramina bilaterally without compressive stenosis.  L5-S1:  A mild broad-based disc bulge is present.  Mild facet hypertrophy is evident bilaterally.  No significant stenosis is present.  IMPRESSION:  1.  Acute / subacute superior endplate compression fracture at T11. 2.  Fractures at L1, L2, and L3 are remote. 3.  Levoconvex scoliosis of the lumbar spine is centered at L2-3. 4.  Mild right lateral recess narrowing at L2-3. 5.  Disc bulging and facet hypertrophy result in some encroachment on the neural foramina at L2-3, L3-4 and L4-5 without significant stenosis.  Original Report Authenticated By: Gerrianne Scale, M.D.    Mr Lumbar Spine Wo Contrast  04/10/2012  **ADDENDUM** CREATED: 04/10/2012 17:27:03  Original report read by Dr. Alfredo Batty.  Addendum by Dr. Purcell Mouton. These examinations were reviewed at the request of Dr. Rosalia Hammers on 04/10/2012. In the findings from the thoracic spine, the acute/subacute superior endplate compression fracture is present at T11 (not L1). In the impression, this should read "acute/subacute superior endplate compression  fracture at T11 without significant retropulsion of bone". The lumbar spine findings and impression appear correct.  **END ADDENDUM** SIGNED BY: Gerrianne Scale, M.D.   04/03/2012  *RADIOLOGY REPORT*  Clinical Data:  Severe low back pain and weakness.  MRI THORACIC AND LUMBAR SPINE WITHOUT CONTRAST  Technique:  Multiplanar and multiecho pulse sequences of the thoracic and lumbar spine were obtained without intravenous contrast.  Comparison:  CT of the abdomen and pelvis 04/02/2012.  MRI THORACIC SPINE  Findings: Normal signal is present throughout the thoracic spinal cord.  An acute / subacute superior endplate compression fracture is present at L1.  The vertebral body height is reduced to 17 mm compared 22 mm at the T10 level.  Edematous changes are evident. An anterior wedge compression fracture at T7 is remote.  No significant retropulsion of bone is present at either level. Facet hypertrophy results in mild osseous foraminal narrowing at T9- 10 and T10-11 bilaterally.  Bibasilar atelectasis is present, right greater than left.  There is aneurysmal dilation of the descending thoracic aorta measuring 3.6 cm in short access.  IMPRESSION:  1.  No acute / subacute superior endplate compression fracture at T11 without significant retropulsion of bone. 2.  Mild osseous foraminal narrowing at T9-10 and T10-11 bilaterally secondary to facet hypertrophy. 3.  Remote anterior endplate compression fracture of T7 with exaggerated kyphosis. 4.  No significant disc  herniation or stenosis elsewhere. 5.  Descending thoracic aortic aneurysm.  MRI LUMBAR SPINE  Findings: Five lumbar-type vertebral bodies are present.  Leftward curvature lumbar spine is centered at L2-3.  A remote vertebral plana compression fracture at L1 is stable.  Remote fractures are noted at L2 and L3.  There is some loss of height involving the L4 and L5 vertebral bodies as well.  The acute / subacute T11 superior endplate compression fracture is again  noted.  There is some edema or hemorrhage anterior to the T11 vertebral body.  No other acute fracture is present.  T12-L1:  No significant disc herniation or stenosis is present.  L1-2:  A mild rightward disc bulge is present without significant stenosis.  The conus medullaris terminates posterior to L1 without significant stenosis.  L2-3:  A broad-based disc herniation is asymmetric to the right. Asymmetric facet hypertrophy is evident.  This results in mild right lateral recess narrowing.  There is some encroachment on the right neural foramen without significant stenosis.  L3-4:  A mild broad-based disc bulge is present.  The disc bulges into the inferior recess of the right lateral foramen without significant stenosis.  L4-5:  A mild broad-based disc bulge is present.  Mild facet hypertrophy is evident bilaterally.  This results in some encroachment on the neural foramina bilaterally without compressive stenosis.  L5-S1:  A mild broad-based disc bulge is present.  Mild facet hypertrophy is evident bilaterally.  No significant stenosis is present.  IMPRESSION:  1.  Acute / subacute superior endplate compression fracture at T11. 2.  Fractures at L1, L2, and L3 are remote. 3.  Levoconvex scoliosis of the lumbar spine is centered at L2-3. 4.  Mild right lateral recess narrowing at L2-3. 5.  Disc bulging and facet hypertrophy result in some encroachment on the neural foramina at L2-3, L3-4 and L4-5 without significant stenosis.  Original Report Authenticated By: Gerrianne Scale, M.D.    Ct Abdomen Pelvis W Contrast  04/02/2012  *RADIOLOGY REPORT*  Clinical Data: Low back and lower abdominal pain.  CT ABDOMEN AND PELVIS WITH CONTRAST  Technique:  Multidetector CT imaging of the abdomen and pelvis was performed following the standard protocol during bolus administration of intravenous contrast.  Contrast: OMNIPAQUE IOHEXOL 300 MG/ML  SOLN  Comparison: Plain films lumbar spine 10/11/2004.  Findings: Large  hiatal hernia is noted.  There is some dependent atelectasis in the lung bases.  No pleural or pericardial effusion. There is cardiomegaly.  Coronary and aortic atherosclerotic vascular disease is identified.  The patient is status post cholecystectomy.  There is a low attenuating lesion near the dome of the liver measuring 1.3 cm in diameter most consistent with a cyst. A tiny calcified lesion is seen off the serosal surface of the posterior right hepatic lobe which may be due to old granulomatous disease or some other insult. There is mild biliary ductal prominence likely related to cholecystectomy and the patient's age.  No ductal stone is seen. Small bilateral renal cysts are noted.  The kidneys are otherwise unremarkable. The spleen is unremarkable.  Very mild prominence of the distal pancreatic duct is noted without obstructing mass is noted.  The stomach and small bowel are unremarkable.  There is some sigmoid diverticulosis without diverticulitis.  The appendix appears normal.  No lymphadenopathy or fluid is identified. Scattered atherosclerotic vascular disease is seen in the descending abdominal aorta without aneurysm.  No dissection is present.  The prostate gland is enlarged.  Urinary  bladder seminal vesicles are normal appearance.  Small fat containing inguinal hernias are seen bilaterally.  Bones demonstrate compression fracture deformities of T7, L1, L2, L3, L4 and L5.  T7 is not included on the prior plain films but the other compression fracture deformities are remote.  Also seen are old right superior and inferior pubic rami fractures.  IMPRESSION:  1.  No acute finding. 2.  Hiatal hernia. 3.  Enlarged prostate gland. 4.  Mild sigmoid diverticulosis without diverticulitis.   Original Report Authenticated By: Bernadene Bell. Maricela Curet, M.D.    US Aorta  04/02/2012  *RADIOLOGY REPORT*  Clinical Data:  Back pain and abdominal pain.  ULTRASOUND OF ABDOMINAL AORTA  Technique:  Ultrasound examination of the  abdominal aorta was performed to evaluate for abdominal aortic aneurysm.  Comparison: CT scan, same date.  Abdominal Aorta:  Mild tortuosity and ectasia of the aorta but no focal aneurysm.  Moderate atherosclerotic calcifications.        Maximum AP diameter:  2.2 cm.       Maximum TRV diameter:  2.4 cm.  IMPRESSION: No abdominal aortic aneurysm identified.   Original Report Authenticated By: P. Loralie Champagne, M.D.    Dg Chest Port 1 View  04/02/2012  *RADIOLOGY REPORT*  Clinical Data: Possible lung mass.  PORTABLE CHEST - 1 VIEW  Comparison: PA and lateral chest 08/12/2010.  Findings: Lung volumes are much lower than on the comparison study. No mass is identified.  The patient has a large hiatal hernia. Heart size is upper normal.  No pneumothorax or pleural fluid.  IMPRESSION:  1.  No acute finding in a low-volume chest. 2.  Large hiatal hernia.   Original Report Authenticated By: Bernadene Bell. D'ALESSIO, M.D.    Dg Swallowing Func-no Report  04/16/2012  CLINICAL DATA: CVA   FLUOROSCOPY FOR SWALLOWING FUNCTION STUDY:  Fluoroscopy was provided for swallowing function study, which was  administered by a speech pathologist.  Final results and recommendations  from this study are contained within the speech pathology report.     Mr Maxine Glenn Head/brain Wo Cm  04/16/2012  *RADIOLOGY REPORT*  Clinical Data:  78 year old male increased weakness greater on the right.  Comparison: Head CT without contrast 04/15/2012.  Brain MRI 90 11.  MRI HEAD WITHOUT CONTRAST  Technique: Multiplanar, multiecho pulse sequences of the brain and surrounding structures were obtained according to standard protocol without intravenous contrast.  Findings: No supratentorial restricted diffusion.  Cerebellum diffusion also within normal limits.  Confirmed on thin slice diffusion imaging there is a left paracentral patchy diffusion abnormality at the left pontomedullary junction (series 11 image 17).  Major intracranial vascular flow voids are  stable.  No midline shift, ventriculomegaly, mass effect, evidence of mass lesion, extra-axial collection or acute intracranial hemorrhage. Cervicomedullary junction and pituitary are within normal limits.  Pronounced and extensive dilated perivascular spaces throughout the deep gray matter nuclei and extending toward the corona radiata (etat crible appearance) are felt to be superimposed on some chronic lacunar infarcts. Mild for age additional patchy T2 and FLAIR hyperintensity in the cerebral white matter.  Overall signal in the supratentorial brain is stable.  No signal changes mass effect or hemorrhage in the left brain stem.  Negative visualized cervical spine normal marrow signal.  Chronic paranasal sinus mucous retention cyst are stable.  Stable orbits. Mastoids are clear.  Negative scalp soft tissues.  IMPRESSION: 1. 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  No mass effect or hemorrhage.  Basilar artery  perforator territory. 2.  See MRA findings below. 3. Otherwise stable MRI appearance of the brain since 2011 including extensive perivascular spaces (etat crible appearance).  MRA HEAD WITHOUT CONTRAST  Technique: Angiographic images of the Circle of Willis were obtained using MRA technique without  intravenous contrast.  Findings: Antegrade flow in the posterior circulation with codominant distal vertebral arteries.  Normal PICA origins.  The moderate stenosis of the distal right vertebral artery just proximal to the vertebrobasilar junction which remains patent. However, there is severe stenosis of the mid basilar artery (series 805 image 7) which does not appear to be related to technical artifact.  Despite this the distal basilar is patent.  SCA and PCA origins are within normal limits.  Posterior communicating arteries are diminutive or absent.  Bilateral PCA branches are within normal limits.  Antegrade flow in both ICA siphons.  No ICA stenosis.  Ophthalmic artery origins are  within normal limits.  Carotid termini are patent within normal limits.  MCA and ACA origins are within normal limits.  Anterior communicating artery, visualized bilateral ACA, visualized right MCA branches are within normal limits.  There is mild to moderate irregularity in the left MCA M2 branches, no major branch occlusion.  IMPRESSION: 1.  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 2.  Distal basilar remains patent. 3.  Mild to moderate distal right vertebral artery stenosis. 4.  Mild anterior circulation atherosclerosis, most apparent in the left MCA M2 branches.  Salient findings discussed by telephone with Dr. Amada Jupiter on 04/16/2012 at 1255 hours.   Original Report Authenticated By: Harley Hallmark, M.D.     DISCHARGE EXAMINATION: Blood pressure 132/68, pulse 76, temperature 98.6 F (37 C), temperature source Oral, resp. rate 18, height 6' (1.829 m), weight 82.555 kg (182 lb), SpO2 100.00%. General appearance: alert, cooperative, appears stated age and no distress Resp: clear to auscultation bilaterally Cardio: regular rate and rhythm, S1, S2 normal, no murmur, click, rub or gallop GI: soft, non-tender; bowel sounds normal; no masses,  no organomegaly Neurologic: right sided weakness is noted  DISPOSITION: Inpatient rehab  Discharge Orders    Future Appointments: Provider: Department: Dept Phone: Center:   04/25/2012 9:00 AM [facility] Dexa 1 [facility] [phone] [facility]   05/22/2012 8:15 AM Eustaquio Boyden, MD [facility] [phone] [facility]   02/12/2013 8:15 AM [facility] [phone] [facility]   02/15/2013 8:30 AM Eustaquio Boyden, MD [facility] [phone] [facility]      Medications:  Scheduled:   . aspirin EC  81 mg Oral Daily  . clopidogrel  75 mg Oral Q breakfast  . enoxaparin  40 mg Subcutaneous Q24H  . loratadine  10 mg Oral QPM  . pantoprazole  40 mg Oral Daily  . potassium chloride  40  mEq Oral BID  . DISCONTD: aspirin  81 mg Oral Daily   YQI:[id number], fentaNYL, hydrALAZINE, methocarbamol, oxyCODONE, RESOURCE THICKENUP CLEAR, senna-docusate  Please note that final discharge medication list will be generated by rehab physicians.   Follow-up Information    Follow up with Eustaquio Boyden, MD. Schedule an appointment as soon as possible for a visit in 2 weeks. (post hospitalization follow up)    Contact information:   [address] [place] [address] [phone]       Call Hillis Range, MD. (As needed. For probelms with heart rate per your PCP)    Contact information:   [address], [address] [place] [address] [phone]  Follow up with Gates Rigg, MD. Schedule an appointment as soon as possible for a visit in 2 months. (stroke follow up)    Contact information:   [address], [address] [facility] [place] [address] [phone]          TOTAL DISCHARGE TIME: 35 mins  [facility]  [facility] Pager [phone]  04/19/2012, 8:55 AM

## 2012-04-19 NOTE — Progress Notes (Signed)
CIR Admission RN: Met with pt and his daughter yesterday to discuss possible admission to CIR. Pt would benefit from inpatient rehab prior to d/c to home. Both are in agreement with plan to come to rehab. Pt's nurse reported pt to be transferred to telemetry. Will f/u today. Please call with questions: [phone].

## 2012-04-19 NOTE — H&P (Signed)
Physical Medicine and Rehabilitation Admission H&P    Chief Complaint  Patient presents with  . Weakness  : HPI: Christopher [name] is a 78 y.o. right-handed male with history of hypertension as well as hyperlipidemia and chronic back pain with associated vertebral fractures. Patient recently discharged from the hospital with a T.-11 compression fracture. Admitted 04/15/2012 with right side weakness and slurred speech. 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 Patient did not receive TPA. Echocardiogram with ejection fraction of 60% and no regional wall motion abnormalities. Carotid Dopplers showed no ICA stenosis. Neurology services consulted placed on Plavix therapy as well as aspirin for CVA prophylaxis as well as subcutaneous Lovenox for DVT prophylaxis. Bouts of orthostatic hypotension received fluid bolus and monitored. Cardiology services consulted 04/17/2012 Dr. Allred in reference to telemetry monitor showing episodes of AV block. It was felt that this episodic AV block represented advanced degeneration of his conduction system at some point in the future possibly may need a pacemaker. He was advised conservative care for now and monitor from a distance as patient was asymptomatic. Speech therapy followup maintain on a dysphagia 1 nectar thick liquid diet. Patient continues to wear a back brace from recent thoracic compression fracture that could be applied in the sitting position. Physical and occupational therapy ongoing with recommendations of physical medicine rehabilitation consult to consider inpatient rehabilitation services. Patient was felt to be due to candidate for inpatient rehabilitation services and was admitted for [facility] program  Review of Systems  Gastrointestinal: Positive for constipation.  Musculoskeletal: Positive for myalgias and  back pain.  Neurological: Positive for focal weakness and weakness.  All other systems reviewed and are negative   Past Medical History  Diagnosis Date  . GERD (gastroesophageal reflux disease) 08/1996  . Hyperlipemia 08/1986  . Hypertension Pre 1996  . Osteoporosis 12/2004  . Pneumonia 22 yoa  . History of Doppler echocardiogram 05/1995    MVP, MOD AI, MILD TR, MILD MR  . Elevated PSA     decided against further eval  . Diverticulosis   . Bilateral sensorineural hearing loss 02/2012    some conductive left side  . Compression fracture     multiple  . AV block, 2nd degree 04/2012   Past Surgical History  Procedure Date  . Cholecystectomy 05/1995  . Exercise treadmill 09/12/2006    NML  . Inguinal hernia repair 08/14/2010    Dr. Martin  . Esophagogastroduodenoscopy 08/18/2004    [facility]. [facility]. Dr. Stark  . Colonoscopy 05/01/2007    divericulosis, int hemorrhoids - Dr. Stark   Family History  Problem Relation Age of Onset  . Heart disease Mother     MI  . Hypertension Mother   . Stroke Mother     ?  . Stroke Father   . Diabetes Sister   . Hypertension Sister   . Hypertension Sister   . Hypertension Sister   . Stroke Sister     recurrent mini strokes  . Cancer Neg Hx    Social History:  reports that he has quit smoking. He has quit using smokeless tobacco. He reports that he does not drink alcohol or use illicit drugs. Allergies: No Known Allergies Medications Prior to Admission  Medication Sig Dispense Refill  . acetaminophen (TYLENOL) 500 MG tablet Take 1,000 mg by mouth every 6 (six) hours as needed. For pain      .   alendronate (FOSAMAX) 70 MG tablet Take 70 mg by mouth every 7 (seven) days. Take with a full glass of water on an empty stomach.      . aspirin EC 81 MG tablet Take 81 mg by mouth daily.      . Cholecalciferol (VITAMIN D) 1000 UNITS capsule Take 1,000 Units by mouth every evening.       . Emollient (EUCERIN CALMING DAILY MOIST) CREA Apply topically. Apply  four times a day to affected area       . esomeprazole (NEXIUM) 40 MG capsule Take 40 mg by mouth daily before breakfast.      . Flaxseed, Linseed, (FLAX SEED OIL) 1000 MG CAPS Take 1 capsule by mouth every evening.       . ibuprofen (ADVIL,MOTRIN) 200 MG tablet Take 200 mg by mouth every 6 (six) hours as needed. For pain.      . lisinopril-hydrochlorothiazide (PRINZIDE,ZESTORETIC) 20-25 MG per tablet Take 1 tablet by mouth daily.      . loratadine (CLARITIN) 10 MG tablet Take 10 mg by mouth every evening.       . oxyCODONE (OXY IR/ROXICODONE) 5 MG immediate release tablet Take 5 mg by mouth every 4 (four) hours as needed. For pain.      . potassium chloride SA (K-DUR,KLOR-CON) 20 MEQ tablet Take 20 mEq by mouth 2 (two) times daily.        Home: Home Living Lives With: Alone Available Help at Discharge: Family Type of Home: House Home Access: Level entry Home Layout: One level Bathroom Shower/Tub: Tub/shower unit;Curtain Bathroom Toilet: Standard Home Adaptive Equipment: Walker - rolling;Bedside commode/3-in-1   Functional History: Prior Function Dressing: Moderate Vocation: Retired Comments: Independent prior to recent T11 compression fx. Since fx, pt has had family member stop by intermittently during day to assist with ADLs. Has been sponge bathing since T11 fx.  Functional Status:  Mobility: Bed Mobility Bed Mobility: Rolling Right;Right Sidelying to Sit;Sitting - Scoot to Edge of Bed Rolling Right: 4: Min guard;With rail Rolling Left: 4: Min assist Right Sidelying to Sit: HOB flat;4: Min guard;With rails Left Sidelying to Sit: 3: Mod assist;With rails Sitting - Scoot to Edge of Bed: 5: Supervision Transfers Transfers: Sit to Stand;Stand to Sit Sit to Stand: 4: Min assist;With upper extremity assist;From bed Sit to Stand: Patient Percentage: 70% Stand to Sit: 4: Min guard;With upper extremity assist;With armrests;To chair/3-in-1 Stand to Sit: Patient Percentage:  70% Ambulation/Gait Ambulation/Gait Assistance: 4: Min assist Ambulation/Gait: Patient Percentage: 70% Ambulation Distance (Feet): 75 Feet Assistive device: Rolling walker Ambulation/Gait Assistance Details: generally steady with good insight on questioning where he should stand in relation to the RW.  Short step length Gait Pattern: Step-through pattern;Decreased step length - right;Decreased step length - left;Decreased stride length (mildly stooped posture) Gait velocity: decreased Stairs: No Wheelchair Mobility Wheelchair Mobility: No  ADL: ADL Grooming: Performed;Shaving;Wash/dry face (Min guard A wash face, Mod A to finish shaving) Where Assessed - Grooming: Supported standing;Supported sitting (standing for face, 1/2 and 1/2 for shaving) Upper Body Dressing: Performed;Set up Where Assessed - Upper Body Dressing: Unsupported sitting Lower Body Dressing: Performed;+1 Total assistance Where Assessed - Lower Body Dressing: Unsupported sitting Toilet Transfer: Simulated;Minimal assistance Toilet Transfer Method: Sit to stand Toilet Transfer Equipment:  (Bed, out into hallway, stood at sink grooming, to recliner) Equipment Used: Rolling walker;Back brace;Gait belt Transfers/Ambulation Related to ADLs: +2 for lines and tubes ADL Comments: Pt has back brace (described as corset by pt) at home. Asked   family member to bring in back brace.   Cognition: Cognition Overall Cognitive Status: Impaired Arousal/Alertness: Awake/alert Orientation Level: Oriented X4 Attention: Alternating Memory: Impaired Memory Impairment: Decreased short term memory;Storage deficit;Decreased recall of new information Awareness: Appears intact Problem Solving: Appears intact Safety/Judgment: Appears intact Cognition Overall Cognitive Status: Appears within functional limits for tasks assessed/performed Area of Impairment: Safety/judgement Arousal/Alertness: Awake/alert Orientation Level: Appears intact  for tasks assessed Behavior During Session: WFL for tasks performed Current Attention Level: Selective Attention - Other Comments: difficulty with maintaining attention for >10 seconds on one task Safety/Judgement: Decreased awareness of safety precautions;Decreased safety judgement for tasks assessed   Blood pressure 132/68, pulse 76, temperature 98.6 F (37 C), temperature source Oral, resp. rate 18, height 6' (1.829 m), weight 82.555 kg (182 lb), SpO2 100.00%. Physical Exam  Vitals reviewed.  Constitutional: He appears well-developed.  HENT: missing multiple teeth, poor dentition, lower partial dentures Head: Normocephalic.  Eyes:  Pupils round and reactive to light  Neck: Normal range of motion. Neck supple. No thyromegaly present.  Cardiovascular:  Cardiac rate is controlled  Pulmonary/Chest: Breath sounds normal. No respiratory distress.  Abdominal: Bowel sounds are normal. He exhibits no distension. There is no tenderness.  Musculoskeletal: He exhibits no edema. Right wrist slightly tender and mild swelling noted. No redness or warmth, mid back tender  Neurological: He is alert.  Patient was able to give appropriate age date of birth in year. He followed three-step commands. Cognitively quite appropriate. Voice a little hoarse Right central 7 and tongue deviation, speech slightly slurred but intelligible. Strength 4/5 RUE, 3+ to4 RLE. No gross sensory changes. Positive right pronator drift. Decreased FTN RUE. Skin: Skin is warm and dry.  Psychiatric: He has a normal mood and affect. His behavior is normal. Judgment and thought content normal   Results for orders placed during the hospital encounter of 04/15/12 (from the past 48 hour(s))  BASIC METABOLIC PANEL     Status: Abnormal   Collection Time   04/18/12 10:40 AM      Component Value Range Comment   Sodium 138  135 - 145 mEq/L    Potassium 3.4 (*) 3.5 - 5.1 mEq/L    Chloride 103  96 - 112 mEq/L    CO2 26  19 - 32 mEq/L     Glucose, Bld 133 (*) 70 - 99 mg/dL    BUN 29 (*) 6 - 23 mg/dL    Creatinine, Ser 1.01  0.50 - 1.35 mg/dL    Calcium 9.5  8.4 - 10.5 mg/dL    GFR calc non Af Amer 66 (*) >90 mL/min    GFR calc Af Amer 76 (*) >90 mL/min   BASIC METABOLIC PANEL     Status: Abnormal   Collection Time   04/19/12  5:55 AM      Component Value Range Comment   Sodium 143  135 - 145 mEq/L    Potassium 4.1  3.5 - 5.1 mEq/L DELTA CHECK NOTED   Chloride 107  96 - 112 mEq/L    CO2 26  19 - 32 mEq/L    Glucose, Bld 96  70 - 99 mg/dL    BUN 28 (*) 6 - 23 mg/dL    Creatinine, Ser 1.06  0.50 - 1.35 mg/dL    Calcium 9.6  8.4 - 10.5 mg/dL    GFR calc non Af Amer 62 (*) >90 mL/min    GFR calc Af Amer 72 (*) >90 mL/min    No results   found.  Post Admission Physician Evaluation: 1. Functional deficits secondary  to left pontomedullary infarct, recent T11 comp fx. 2. Patient is admitted to receive collaborative, interdisciplinary care between the physiatrist, rehab nursing staff, and therapy team. 3. Patient's level of medical complexity and substantial therapy needs in context of that medical necessity cannot be provided at a lesser intensity of care such as a SNF. 4. Patient has experienced substantial functional loss from his/her baseline which was documented above under the "Functional History" and "Functional Status" headings.  Judging by the patient's diagnosis, physical exam, and functional history, the patient has potential for functional progress which will result in measurable gains while on inpatient rehab.  These gains will be of substantial and practical use upon discharge  in facilitating mobility and self-care at the household level. 5. Physiatrist will provide 24 hour management of medical needs as well as oversight of the therapy plan/treatment and provide guidance as appropriate regarding the interaction of the two. 6. 24 hour rehab nursing will assist with bladder management, bowel management, safety,  skin/wound care, disease management, medication administration, pain management and patient education  and help integrate therapy concepts, techniques,education, etc. 7. PT will assess and treat for:  fxnl mobility, pain mgt, back precautions, NMR, adaptive equipment.  Goals are: mod I to supervision. 8. OT will assess and treat for: UES, ADL's, NMR, fxnl mobility, don doff of brace, adaptive equipment.   Goals are: mod I to minimal assist. 9. SLP will assess and treat for: speech, communication, swallow.  Goals are: mod I. 10. Case Management and Social Worker will assess and treat for psychological issues and discharge planning. 11. Team conference will be held weekly to assess progress toward goals and to determine barriers to discharge. 12. Patient will receive at least 3 hours of therapy per day at least 5 days per week. 13. ELOS: 10 days      Prognosis:  excellent   Medical Problem List and Plan: 1. 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 DVT Prophylaxis/Anticoagulation: Subcutaneous Lovenox. Monitor platelet counts any signs of bleeding 3. Pain Management: Oxycodone and Robaxin as needed. Monitor the increased mobility  -add voltaren gel to right wrist, ? Mild OA  -back brace 4. Neuropsych: This patient is capable of making decisions on his/her own behalf. 5. Recent thoracic T11 compression fracture. Conservative care with a back brace when out of bed 6. Hypertension/second degree AV block/sinus bradycardia. Telemetry monitor has been stable. Followup per cardiology services with conservative care. Avoid AV nodal blocking agents. Monitor for any signs of orthostasis 7. Dysphagia. Dysphagia 1 nectar liquid diet. Followup per speech therapy. Monitor for any signs of aspiration 8. GERD. Protonix  04/19/2012, Zach Jacoria Keiffer, MD 

## 2012-04-19 NOTE — Progress Notes (Signed)
SLP has reviewed and agrees with student's note below.  Windie Marasco Willis Barbaraann Avans M.Ed CCC-SLP Pager [phone]  04/19/2012  

## 2012-04-19 NOTE — Progress Notes (Signed)
Speech Language Pathology Dysphagia Treatment Patient Details Name: Christopher [name] MRN: [id number] DOB: 12-23-1923 Today's Date: 04/19/2012 Time: 4098-1191 SLP Time Calculation (min): 29 min  Assessment / Plan / Recommendation Clinical Impression  Pt. seen for dysphagia treatment focusing on utilization of compensatory strategies, safe swallow function, and trials of upgraded textures (partial plate now at bedside). Minimal verbal cueing required for Pt. to implement compensatory strategies on nectar thick liquids. No s/s of aspiration observed with nectar, puree, or solid consistencies at bedside. Matication and transit with cracker slightly delayed, however functional for Dys 3 texture. Pt. denied any globus sensation in pharynx.  SLP recommends diet texture upgrade to Dys. 3 diet and continue nectar thick liquids and meds whole in puree. Aspiration precautions and compensatory strategies reviewed with Pt. ST recommend in next venue of care (inpatient rehab at [facility]) to ensure continued safety and readiness for repeat MBS (recommend repeat MBS early next week for possible liquid upgrade).     Diet Recommendation  Initiate / Change Diet: Dysphagia 3 (mechanical soft);Nectar-thick liquid    SLP Plan Continue with current plan of care      Swallowing Goals  SLP Swallowing Goals Patient will consume recommended diet without observed clinical signs of aspiration with: Modified independent assistance Swallow Study Goal #1 - Progress: Progressing toward goal Patient will utilize recommended strategies during swallow to increase swallowing safety with: Minimal cueing Swallow Study Goal #2 - Progress: Progressing toward goal  General Temperature Spikes Noted: No Respiratory Status: Room air Behavior/Cognition: Alert;Cooperative;Pleasant mood Oral Cavity - Dentition: Missing dentition (partials present) Patient Positioning: Upright in bed  Oral Cavity - Oral Hygiene Does patient have any of the  following "at risk" factors?: Diet - patient on thickened liquids;Other - dysphagia Brush patient's teeth BID with toothbrush (using toothpaste with fluoride): Yes Patient is HIGH RISK - Oral Care Protocol followed (see row info): Yes Patient is AT RISK - Oral Care Protocol followed (see row info): Yes   Dysphagia Treatment Treatment focused on: Skilled observation of diet tolerance;Upgraded PO texture trials;Patient/family/caregiver education;Utilization of compensatory strategies Treatment Methods/Modalities: Skilled observation Patient observed directly with PO's: Yes Type of PO's observed: Regular;Dysphagia 1 (puree);Nectar-thick liquids Feeding: Able to feed self Liquids provided via: Cup;No straw Oral Phase Signs & Symptoms: Prolonged oral phase (with solid consistencies likely due to partials ) Type of cueing: Verbal Amount of cueing: Minimal   GO     Theotis Burrow 04/19/2012, 2:24 PM

## 2012-04-20 ENCOUNTER — Inpatient Hospital Stay (HOSPITAL_COMMUNITY): Payer: Medicare Other

## 2012-04-20 ENCOUNTER — Inpatient Hospital Stay (HOSPITAL_COMMUNITY): Payer: Medicare Other | Admitting: Occupational Therapy

## 2012-04-20 ENCOUNTER — Inpatient Hospital Stay (HOSPITAL_COMMUNITY): Payer: Medicare Other | Admitting: Speech Pathology

## 2012-04-20 DIAGNOSIS — I639 Cerebral infarction, unspecified: Secondary | ICD-10-CM

## 2012-04-20 DIAGNOSIS — I6330 Cerebral infarction due to thrombosis of unspecified cerebral artery: Secondary | ICD-10-CM

## 2012-04-20 DIAGNOSIS — I69993 Ataxia following unspecified cerebrovascular disease: Secondary | ICD-10-CM

## 2012-04-20 DIAGNOSIS — Z5189 Encounter for other specified aftercare: Secondary | ICD-10-CM

## 2012-04-20 HISTORY — DX: Cerebral infarction, unspecified: I63.9

## 2012-04-20 LAB — COMPREHENSIVE METABOLIC PANEL
Albumin: 3 g/dL — ABNORMAL LOW (ref 3.5–5.2)
BUN: 27 mg/dL — ABNORMAL HIGH (ref 6–23)
Creatinine, Ser: 1.04 mg/dL (ref 0.50–1.35)
GFR calc Af Amer: 73 mL/min — ABNORMAL LOW (ref >90)
Glucose, Bld: 112 mg/dL — ABNORMAL HIGH (ref 70–99)
Total Bilirubin: 0.8 mg/dL (ref 0.3–1.2)
Total Protein: 7.1 g/dL (ref 6.0–8.3)

## 2012-04-20 LAB — CBC WITH DIFFERENTIAL/PLATELET
Basophils Relative: 0 % (ref 0–1)
Eosinophils Absolute: 0.2 10*3/uL (ref 0.0–0.7)
HCT: 38 % — ABNORMAL LOW (ref 39.0–52.0)
Hemoglobin: 12.8 g/dL — ABNORMAL LOW (ref 13.0–17.0)
MCH: 31.6 pg (ref 26.0–34.0)
MCHC: 33.7 g/dL (ref 30.0–36.0)
MCV: 93.8 fL (ref 78.0–100.0)
Monocytes Absolute: 1 10*3/uL (ref 0.1–1.0)
Monocytes Relative: 11 % (ref 3–12)

## 2012-04-20 LAB — URINALYSIS, ROUTINE W REFLEX MICROSCOPIC
Ketones, ur: NEGATIVE mg/dL
Leukocytes, UA: NEGATIVE
Nitrite: NEGATIVE
Protein, ur: NEGATIVE mg/dL

## 2012-04-20 MED ORDER — BIOTENE DRY MOUTH MT LIQD
15.0000 mL | Freq: Two times a day (BID) | OROMUCOSAL | Status: DC
  Administered 2012-04-21 – 2012-04-24 (×6): 15 mL via OROMUCOSAL

## 2012-04-20 MED ORDER — CHLORHEXIDINE GLUCONATE 0.12 % MT SOLN
15.0000 mL | Freq: Two times a day (BID) | OROMUCOSAL | Status: DC
  Administered 2012-04-21 – 2012-04-25 (×10): 15 mL via OROMUCOSAL
  Filled 2012-04-20 (×14): qty 15

## 2012-04-20 NOTE — Care Management Note (Signed)
Inpatient Rehabilitation Center Individual Statement of Services  Patient Name:  Christopher [name]  Date:  04/20/2012  Welcome to the Inpatient Rehabilitation Center.  Our goal is to provide you with an individualized program based on your diagnosis and situation, designed to meet your specific needs.  With this comprehensive rehabilitation program, you will be expected to participate in at least 3 hours of rehabilitation therapies Monday-Friday, with modified therapy programming on the weekends.  Your rehabilitation program will include the following services:  Physical Therapy (PT), Occupational Therapy (OT), Speech Therapy (ST), 24 hour per day rehabilitation nursing, Therapeutic Recreaction (TR), Neuropsychology, Case Management (RN and Social Worker), Rehabilitation Medicine, Nutrition Services and Pharmacy Services  Weekly team conferences will be held on Wednesday to discuss your progress.  Your RN Case Designer, television/film set will talk with you frequently to get your input and to update you on team discussions.  Team conferences with you and your family in attendance may also be held.  Expected length of stay: 2 weeks Overall anticipated outcome: supervision/mod/i level  Depending on your progress and recovery, your program may change.  Your RN Case Estate agent will coordinate services and will keep you informed of any changes.  Your RN Sports coach and SW names and contact numbers are listed  below.  The following services may also be recommended but are not provided by the Inpatient Rehabilitation Center:   Driving Evaluations  Home Health Rehabiltiation Services  Outpatient Rehabilitatation [facility]  Vocational Rehabilitation   Arrangements will be made to provide these services after discharge if needed.  Arrangements include referral to agencies that provide these services.  Your insurance has been verified to be:  Medicare Your primary doctor is:  Dr Eustaquio Boyden  Pertinent information will be shared with your doctor and your insurance company.    Social Worker:  Dossie Der, [place] [phone]  Information discussed with and copy given to patient by: Lucy Chris, 04/20/2012, 11:35 AM

## 2012-04-20 NOTE — Progress Notes (Signed)
Patient ID: Christopher [name], male   DOB: 03/31/1924, 78 y.o.   MRN: [id number]  Subjective/Complaints: I walked a mile a day Review of Systems  Respiratory: Positive for shortness of breath. Negative for cough, hemoptysis, sputum production and wheezing.        Nocturnal  All other systems reviewed and are negative.   Objective: Vital Signs: Blood pressure 118/87, pulse 75, temperature 98 F (36.7 C), temperature source Oral, resp. rate 19, height 5\' 11"  (1.803 m), weight 68.7 kg (151 lb 7.3 oz), SpO2 95.00%. No results found. Results for orders placed during the hospital encounter of 04/19/12 (from the past 72 hour(s))  CBC WITH DIFFERENTIAL     Status: Abnormal   Collection Time   04/20/12  5:10 AM      Component Value Range Comment   WBC 9.2  4.0 - 10.5 K/uL    RBC 4.05 (*) 4.22 - 5.81 MIL/uL    Hemoglobin 12.8 (*) 13.0 - 17.0 g/dL    HCT 11.9 (*) 14.7 - 52.0 %    MCV 93.8  78.0 - 100.0 fL    MCH 31.6  26.0 - 34.0 pg    MCHC 33.7  30.0 - 36.0 g/dL    RDW 82.9  56.2 - 13.0 %    Platelets 250  150 - 400 K/uL    Neutrophils Relative 67  43 - 77 %    Neutro Abs 6.2  1.7 - 7.7 K/uL    Lymphocytes Relative 19  12 - 46 %    Lymphs Abs 1.8  0.7 - 4.0 K/uL    Monocytes Relative 11  3 - 12 %    Monocytes Absolute 1.0  0.1 - 1.0 K/uL    Eosinophils Relative 3  0 - 5 %    Eosinophils Absolute 0.2  0.0 - 0.7 K/uL    Basophils Relative 0  0 - 1 %    Basophils Absolute 0.0  0.0 - 0.1 K/uL   COMPREHENSIVE METABOLIC PANEL     Status: Abnormal   Collection Time   04/20/12  5:10 AM      Component Value Range Comment   Sodium 142  135 - 145 mEq/L    Potassium 3.7  3.5 - 5.1 mEq/L    Chloride 106  96 - 112 mEq/L    CO2 26  19 - 32 mEq/L    Glucose, Bld 112 (*) 70 - 99 mg/dL    BUN 27 (*) 6 - 23 mg/dL    Creatinine, Ser 8.65  0.50 - 1.35 mg/dL    Calcium 9.5  8.4 - 78.4 mg/dL    Total Protein 7.1  6.0 - 8.3 g/dL    Albumin 3.0 (*) 3.5 - 5.2 g/dL    AST 18  0 - 37 U/L    ALT 25  0 - 53  U/L    Alkaline Phosphatase 65  39 - 117 U/L    Total Bilirubin 0.8  0.3 - 1.2 mg/dL    GFR calc non Af Amer 63 (*) >90 mL/min    GFR calc Af Amer 73 (*) >90 mL/min      HEENT: poor dentition Cardio: RRR Resp: CTA B/L GI: BS positive Extremity:  Pulses positive and No Edema Skin:   Intact Neuro: Alert/Oriented Musc/Skel:  Normal Motor 4/5 on R side, 5/5 on L side Sensory- intact light touch BUE and BLE Cerebellar no ataxia  Assessment/Plan: 1. Functional deficits secondary to L pontomedullary infarct with balance  d/o which require 3+ hours per day of interdisciplinary therapy in a comprehensive inpatient rehab setting. Physiatrist is providing close team supervision and 24 hour management of active medical problems listed below. Physiatrist and rehab team continue to assess barriers to discharge/monitor patient progress toward functional and medical goals. FIM:             FIM - Banker Devices: Bed rails;Arm rests Bed/Chair Transfer: 4: Supine > Sit: Min A (steadying Pt. > 75%/lift 1 leg);4: Sit > Supine: Min A (steadying pt. > 75%/lift 1 leg);3: Bed > Chair or W/C: Mod A (lift or lower assist);3: Chair or W/C > Bed: Mod A (lift or lower assist)     Comprehension Comprehension Mode: Auditory Comprehension: 6-Follows complex conversation/direction: With extra time/assistive device  Expression Expression Mode: Verbal Expression: 6-Expresses complex ideas: With extra time/assistive device  Social Interaction Social Interaction: 6-Interacts appropriately with others with medication or extra time (anti-anxiety, antidepressant).  Problem Solving Problem Solving: 6-Solves complex problems: With extra time  Memory Memory: 6-More than reasonable amt of time  Medical Problem List and Plan:  1. 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 DVT Prophylaxis/Anticoagulation: Subcutaneous Lovenox. Monitor platelet counts any signs of bleeding  3. Pain Management: Oxycodone and Robaxin as needed. Monitor the increased mobility  -add voltaren gel to right wrist, ? Mild OA  -back brace  4. Neuropsych: This patient is capable of making decisions on his/her own behalf.  5. Recent thoracic T11 compression fracture. Conservative care with a back brace when out of bed  6. Hypertension/second degree AV block/sinus bradycardia. Telemetry monitor has been stable. Followup per cardiology services with conservative care. Avoid AV nodal blocking agents. Monitor for any signs of orthostasis  7. Dysphagia. Dysphagia 1 nectar liquid diet. Followup per speech therapy. Monitor for any signs of aspiration  8. GERD. Protonix  LOS (Days) 1 A FACE TO FACE EVALUATION WAS PERFORMED  KIRSTEINS,ANDREW E 04/20/2012, 8:05 AM

## 2012-04-20 NOTE — Evaluation (Signed)
Physical Therapy Assessment and Plan  Patient Details  Name: Christopher [name] MRN: [id number] Date of Birth: 1924/05/13  PT Diagnosis: Difficulty walking, Hemiparesis dominant and Muscle weakness Rehab Potential:good    ELOS:   2 weeks  Today's Date: 04/20/2012 Time: 4098-1191 Time Calculation (min): 58 min  Problem List:  Patient Active Problem List  Diagnosis  . HYPERLIPIDEMIA  . HYPERTENSION  . HEMORRHOIDS, INTERNAL  . GERD  . HIATAL HERNIA  . DIVERTICULOSIS, COLON  . ROSACEA  . OSTEOPOROSIS  . PROSTATE SPECIFIC ANTIGEN, ELEVATED  . Dry skin  . Cellulitis of hand, right  . Medicare annual wellness visit, subsequent  . Abdominal pain  . Compression fracture  . Hypercalcemia  . Anemia  . Urgency of urination  . Constipation due to pain medication  . Dyspnea  . Right sided weakness  . Bradycardia  . Hypokalemia  . Second degree AV block  . Hypotension  . Acute ischemic stroke  . CVA (cerebral infarction)    Past Medical History:  Past Medical History  Diagnosis Date  . GERD (gastroesophageal reflux disease) 08/1996  . Hyperlipemia 08/1986  . Hypertension Pre 1996  . Osteoporosis 12/2004  . Pneumonia 22 yoa  . History of Doppler echocardiogram 05/1995    MVP, MOD AI, MILD TR, MILD MR  . Elevated PSA     decided against further eval  . Diverticulosis   . Bilateral sensorineural hearing loss 02/2012    some conductive left side  . Compression fracture     multiple  . AV block, 2nd degree 04/2012   Past Surgical History:  Past Surgical History  Procedure Date  . Cholecystectomy 05/1995  . Exercise treadmill 09/12/2006    NML  . Inguinal hernia repair 08/14/2010    Dr. Daphine Deutscher  . Esophagogastroduodenoscopy 08/18/2004    H. H. Dr. Russella Dar  . Colonoscopy 05/01/2007    divericulosis, int hemorrhoids - Dr. Russella Dar    Assessment & Plan Clinical Impression: Christopher [name] is a 78 y.o. right-handed male with history of hypertension as well as hyperlipidemia and  chronic back pain with associated vertebral fractures. Patient recently discharged from the hospital with a T-11 compression fracture. Pt was receiving HHPT due to T11 fx.   Admitted 04/15/2012 with right side weakness and slurred speech. 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 Patient did not receive TPA. Echocardiogram with ejection fraction of 60% and no regional wall motion abnormalities.   Patient transferred to CIR on 04/19/2012 .   Patient currently requires mod with mobility secondary to muscle weakness and muscle joint tightness and impaired timing and sequencing, unbalanced muscle activation and decreased coordination.  Prior to hospitalization, patient was independent with mobility and lived alone Alone in a House single level home.  Adult children check on him and assist with donning TLSO, cooking, cleaning, etc, daily.  Home access is threshold to enter, sidewalk from car into house.  Patient will benefit from skilled PT intervention to maximize safe functional mobility, minimize fall risk and decrease caregiver burden for planned discharge home with intermittent assist.  Anticipate patient will benefit from follow up [facility] at discharge.  PT - End of Session Activity Tolerance: Tolerates < 10 min activity with changes in vital signs (dyspneic 2/4 majority of session, in sitting with TLSO on) PT Assessment Rehab Potential: Good Barriers to Discharge: Decreased caregiver support PT Plan  PT Frequency: 1-2 X/day, 60-90 minutes;5 out of 7 days Estimated Length of Stay: 2 weeks PT Treatment/Interventions: Ambulation/gait training;Balance/vestibular training;Discharge planning;DME/adaptive equipment instruction;Functional mobility training;Neuromuscular re-education;Patient/family education;Splinting/orthotics;Stair training;Therapeutic Activities;Therapeutic  Exercise;UE/LE Strength taining/ROM;UE/LE Coordination activities;Wheelchair propulsion/positioning PT Recommendation Follow Up Recommendations: Home health PT Equipment Recommended: Rolling walker with 5" wheels  PT Evaluation Precautions/Restrictions Precautions Precautions: Back;Fall Required Braces or Orthoses: Spinal Brace Spinal Brace: Thoracolumbosacral orthotic Restrictions Weight Bearing Restrictions: No Vital Signs Therapy Vitals Resp:  (pt dyspneic 2/4 in sitting, wearing TLSO, without exertion) Pain Pain Assessment Pain Assessment: 0-10 Pain Score:   5 Pain Type: Chronic pain Pain Location: Back Pain Orientation: Lower Pain Descriptors: Aching Pain Frequency: Occasional Pain Onset: Gradual Patients Stated Pain Goal: 2 Pain Intervention(s): Medication (See eMAR) Multiple Pain Sites: No Home Living/Prior Functioning Home Living Home Access: threshold to enter single level,  (sidewalk to house) Home Adaptive Equipment: Walker - rolling Prior Function Comments: likes to sit in the shade in the back yard Vision/Perception  Vision - History Baseline Vision: Wears glasses only for reading Patient Visual Report: No change from baseline Vision - Assessment Eye Alignment: Within Functional Limits Perception Perception: Within Functional Limits Praxis Praxis: Intact  Cognition- And O x 4 Overall Cognitive Status: Appears within functional limits for tasks assessed Sensation Sensation Light Touch: Appears Intact Proprioception: Appears Intact Coordination Heel Shin Test: decreased speed, accuracy and excursion Motor  Motor Motor: Hemiplegia  Mobility Bed Mobility Bed Mobility: Rolling Right;Rolling Left;Right Sidelying to Sit Rolling Right: 5: Supervision Rolling Left: 5: Supervision Rolling Left Details: Verbal cues for technique Right Sidelying to Sit: 4: Min assist Right Sidelying to Sit Details: Manual facilitation for placement Transfers Sit to  Stand: 4: Min assist;With upper extremity assist;With armrests;From bed Stand to Sit: 4: Min assist Locomotion  Ambulation Ambulation: Yes Ambulation/Gait Assistance: 4: Min assist Ambulation Distance (Feet): 25 Feet Assistive device: Rolling walker (pt was using RW  prior to CVA due to recent T11 fx) Gait Gait: Yes Gait Pattern: Step-through pattern;Decreased stance time - right;Decreased hip/knee flexion - right;Decreased dorsiflexion - right;Trunk flexed;Narrow base of support;Decreased stride length (R hip ER) Gait velocity: decreased Stairs / Additional Locomotion Stairs: No Wheelchair Mobility Wheelchair Mobility: Yes Wheelchair Propulsion: Both upper extremities Wheelchair Parts Management: Needs assistance Distance: 25  Trunk/Postural Assessment  Thoracic Assessment Thoracic Assessment: Exceptions to [facility] (kyphotic; ) Lumbar Assessment Lumbar Assessment: Exceptions to [facility]  Balance Balance Balance Assessed: Yes Static Sitting Balance Static Sitting - Level of Assistance: 7: Independent Static Standing Balance Static Standing - Level of Assistance: 5: Stand by assistance Dynamic Standing Balance Dynamic Standing - Level of Assistance: 4: Min assist Extremity Assessment  RUE Assessment RUE Assessment: Within Functional Limits (movement is slower, but able to use it functionally) LUE Assessment LUE Assessment: Within Functional Limits RLE Assessment- heel cord and hamstrings tight RLE Assessment:  (dependent cyanosis of foot evident ) RLE Strength RLE Overall Strength Comments: grossly 4/5 throughout LLE Assessment- heel cord and hamstrings tight LLE Assessment:  WFL (dependent cyanosis of foot evident)  See FIM for current functional status Refer to Care Plan for Long Term Goals  Recommendations for other services: None  Discharge Criteria: Patient will be discharged from PT if patient refuses treatment 3 consecutive times without medical reason, if treatment  goals not met, if there is a change in medical status, if patient makes no progress towards goals or if patient is discharged from hospital.  The above assessment, treatment plan, treatment alternatives and goals were discussed and mutually  agreed upon: by patient  Treatment today: dynamic sitting balance sitting on EOB, back unsupported, without TLSO, eating breakfast using right hand primarily, feet supported, with supervision.   No c/o back pain.  Cues for upright posture.    Aftyn Nott 04/20/2012, 3:12 PM

## 2012-04-20 NOTE — Progress Notes (Signed)
Social Work Assessment and Plan Social Work Assessment and Plan  Patient Details  Name: Christopher [name] MRN: [id number] Date of Birth: 04/13/1924  Today's Date: 04/20/2012  Problem List:  Patient Active Problem List  Diagnosis  . HYPERLIPIDEMIA  . HYPERTENSION  . HEMORRHOIDS, INTERNAL  . GERD  . HIATAL HERNIA  . DIVERTICULOSIS, COLON  . ROSACEA  . OSTEOPOROSIS  . PROSTATE SPECIFIC ANTIGEN, ELEVATED  . Dry skin  . Cellulitis of hand, right  . Medicare annual wellness visit, subsequent  . Abdominal pain  . Compression fracture  . Hypercalcemia  . Anemia  . Urgency of urination  . Constipation due to pain medication  . Dyspnea  . Right sided weakness  . Bradycardia  . Hypokalemia  . Second degree AV block  . Hypotension  . Acute ischemic stroke  . CVA (cerebral infarction)   Past Medical History:  Past Medical History  Diagnosis Date  . GERD (gastroesophageal reflux disease) 08/1996  . Hyperlipemia 08/1986  . Hypertension Pre 1996  . Osteoporosis 12/2004  . Pneumonia 22 yoa  . History of Doppler echocardiogram 05/1995    MVP, MOD AI, MILD TR, MILD MR  . Elevated PSA     decided against further eval  . Diverticulosis   . Bilateral sensorineural hearing loss 02/2012    some conductive left side  . Compression fracture     multiple  . AV block, 2nd degree 04/2012   Past Surgical History:  Past Surgical History  Procedure Date  . Cholecystectomy 05/1995  . Exercise treadmill 09/12/2006    NML  . Inguinal hernia repair 08/14/2010    Dr. Daphine Deutscher  . Esophagogastroduodenoscopy 08/18/2004    H. H. Dr. Russella Dar  . Colonoscopy 05/01/2007    divericulosis, int hemorrhoids - Dr. Russella Dar   Social History:  reports that he has quit smoking. He has quit using smokeless tobacco. He reports that he does not drink alcohol or use illicit drugs.  Family / Support Systems Marital Status: Widow/Widower How Long?: 4 years Patient Roles: Parent Children: [name]-son  [phone]   [name] Lewis-daughter  [phone] Anticipated Caregiver: Children-help each other Ability/Limitations of Caregiver: Can not provide 24 hour care Caregiver Availability: Intermittent Family Dynamics: Pt has been quite active until now-even with his back pain.  He is close with his children and they are involved.    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  Son reports pthas always been active and healthy. Recent Psychosocial Issues: recent admisison for back issues wearing a brace now. Pyschiatric History: No hisotry- deprssion screen score-2.  Pt feels optimisitc regarding his recovery from this stroke.  He is one to take it as it comes. Substance Abuse History: No issues  Patient / Family Perceptions, Expectations & Goals Pt/Family understanding of illness & functional limitations: Pt and son can explain his stroke and deficits.  He is using his arm well and is hoepful his leg will come along also.  Pt has always been active and independent all of his life  and wants to remain so. Premorbid pt/family roles/activities: Father, Emelia Loron, retiree, Home owner, [employer] member, etc Anticipated changes in roles/activities/participation: Resume Pt/family expectations/goals: Pt states: " I want to do for myself, and not burden others. "  Son reports: " We are hopeful he will do well here, but will help him  at home."  [employer]: None Premorbid Home Care/DME Agencies: Other (Comment) ([facility]-PT, RN) Transportation available at discharge: Family-children Resource referrals recommended: Support group (specify) ([facility] Support Group)  Discharge Planning Living Arrangements: Alone Support Systems: Children;Other relatives;Friends/neighbors;Church/faith community Type of Residence: Private residence Insurance Resources: [employer] Financial Resources: Social Security Financial Screen Referred: No Living Expenses: Own Money Management: Patient Do you have any problems obtaining your medications?: No Home Management: Patient but now will have family do Patient/Family Preliminary Plans: Return home with family coming in and out-run a Florist so making deliveries.  If more assistance needed will come up with a new plan. Social Work Anticipated Follow Up Needs: HH/OP;Support Group  Clinical Impression Pleasant gentleman along with family members, very positive and motivated to improve and do for himself.  Should do well here, has good movement, brace is bundlesome. Family here to observe in therapies.  Lucy Chris 04/20/2012, 11:49 AM

## 2012-04-20 NOTE — Progress Notes (Signed)
Patient information reviewed and entered into [facility] system by Keonta Monceaux, RN, CRRN, PPS Coordinator.  Information including medical coding and functional independence measure will be reviewed and updated through discharge.     Per nursing patient was given "Data Collection Information Summary for Patients in Inpatient Rehabilitation Facilities with attached "Privacy Act Statement-Health Care Records" upon admission.   

## 2012-04-20 NOTE — Evaluation (Signed)
Speech Language Pathology Assessment and Plan  Patient Details  Name: Christopher [name] MRN: [id number] Date of Birth: 06/26/1924  SLP Diagnosis: Speech and Language deficits;Dysphagia;Cognitive Impairments  Rehab Potential: Good ELOS:  2 weeks   Today's Date: 04/20/2012 Time: 0305-0405 Time Calculation (min): 60 min  Problem List:  Patient Active Problem List  Diagnosis  . HYPERLIPIDEMIA  . HYPERTENSION  . HEMORRHOIDS, INTERNAL  . GERD  . HIATAL HERNIA  . DIVERTICULOSIS, COLON  . ROSACEA  . OSTEOPOROSIS  . PROSTATE SPECIFIC ANTIGEN, ELEVATED  . Dry skin  . Cellulitis of hand, right  . Medicare annual wellness visit, subsequent  . Abdominal pain  . Compression fracture  . Hypercalcemia  . Anemia  . Urgency of urination  . Constipation due to pain medication  . Dyspnea  . Right sided weakness  . Bradycardia  . Hypokalemia  . Second degree AV block  . Hypotension  . Acute ischemic stroke  . CVA (cerebral infarction)   Past Medical History:  Past Medical History  Diagnosis Date  . GERD (gastroesophageal reflux disease) 08/1996  . Hyperlipemia 08/1986  . Hypertension Pre 1996  . Osteoporosis 12/2004  . Pneumonia 22 yoa  . History of Doppler echocardiogram 05/1995    MVP, MOD AI, MILD TR, MILD MR  . Elevated PSA     decided against further eval  . Diverticulosis   . Bilateral sensorineural hearing loss 02/2012    some conductive left side  . Compression fracture     multiple  . AV block, 2nd degree 04/2012   Past Surgical History:  Past Surgical History  Procedure Date  . Cholecystectomy 05/1995  . Exercise treadmill 09/12/2006    NML  . Inguinal hernia repair 08/14/2010    Dr. Daphine Deutscher  . Esophagogastroduodenoscopy 08/18/2004    H. H. Dr. Russella Dar  . Colonoscopy 05/01/2007    divericulosis, int hemorrhoids - Dr. Russella Dar    Assessment / Plan / Recommendation Clinical Impression  Christopher [name] is a 78 y.o. right-handed male with history of hypertension as well  as hyperlipidemia and chronic back pain with associated vertebral fractures. Patient recently discharged from the hospital with a T-11 compression fracture. Patient continues to wear a back brace from recent thoracic compression fracture that could be applied in the sitting position.  Admitted 04/15/2012 with right side weakness and slurred speech. MRI of the brain showed small acute brainstem infarct in the left Pontomedullary junction without mass effect. Acute speech therapy reports patient is tolerating a dysphagia 1 nectar thick liquid diet. Patient transferred to [facility] 04/19/2012.  SLP evaluation revealed mild cognitive deficits as evidenced by decreased recall (specifically about T11 compression fracture) and decreased topic maintenance.  Oral mechanism exam revealed mild sensory deficits in the face and lips, and mildly decreased range of motion of the tongue which impact speech intelligilbity as well as swallow function.  During bedside swallowing evaluation, patient exhibited prolonged oral transit time, and mild oral residue while consuming Dys. 2 textures.  Nectar thick liquids, and thin liquids resulted in delayed swallow and cough (x1) which SLP suspects to be due to initial thin trials.  Patient's diet upgraded to Dys. 2 textures and nectar thick liquids.  Patient continues to be a good candidate for inpatient rehabilitation services.  SLP services are recommended during CIR stay to maximize functional independence and reduce burden of care upon discharge.      SLP Assessment  Patient will need skilled Speech Language Pathology Services during CIR admission  Recommendations  Follow up Recommendations: Other (comment) (TBD) Equipment Recommended: None recommended by SLP    SLP Frequency 1-2 X/day, 30-60 minutes;5 out of 7 days   SLP Treatment/Interventions Internal/external aids;Dysphagia/aspiration precaution training;Cognitive remediation/compensation;Cueing hierarchy;Functional  tasks;Environmental controls;Patient/family education;Speech/Language facilitation;Therapeutic Activities;Multimodal communication approach    Pain 0  Prior Functioning Type of Home: House Lives With: Alone Available Help at Discharge: Family Vocation: Retired  Teacher, music Term Goals: Week 1: SLP Short Term Goal 1 (Week 1): Patient will tolerate Dys. 2 textures and nectar thick liquids with supervision cues to recall safe swallowing strategies with no overt s/s of aspiration.   SLP Short Term Goal 2 (Week 1): Patient will problem solve complex functional tasks with min assist verbal cues.   SLP Short Term Goal 3 (Week 1): Patient will identify at least two strategies to improve speech intelligibility with supervision cues.   SLP Short Term Goal 4 (Week 1): Patient will utilize speech intelligibility strategies at the conversational level with supervision cues.   SLP Short Term Goal 5 (Week 1): Patient will answer questions with no more than 2 verbal cues to stay on topic.    See FIM for current functional status Refer to Care Plan for Long Term Goals  Recommendations for other services: None  Discharge Criteria: Patient will be discharged from SLP if patient refuses treatment 3 consecutive times without medical reason, if treatment goals not met, if there is a change in medical status, if patient makes no progress towards goals or if patient is discharged from hospital.  The above assessment, treatment plan, treatment alternatives and goals were discussed and mutually agreed upon: by patient  Christopher [name]  Graduate Clinician Speech Language Pathology   Page, Joni Reining 04/20/2012, 5:33 PM  The above assessment and treatment plan has been reviewed and SLP is in agreement. Fae Pippin, M.A., CCC-SLP [phone]

## 2012-04-20 NOTE — Evaluation (Signed)
Occupational Therapy Assessment and Plan  Patient Details  Name: Christopher [name] MRN: [id number] Date of Birth: 1923-12-10  OT Diagnosis: hemiplegia affecting dominant side Rehab Potential: Rehab Potential: Good ELOS: 2 weeks   Today's Date: 04/20/2012 Time: 3086-5784 Time Calculation (min): 62 min  1:1 Pt seen for initial evaluation and BADL retraining of B/D at sink level to introduce pt. to AE for his back precautions.  Pt's TLSO appears to be too long and asked PA to contact [employer].  Pt demonstrated fair activity tolerance, good use of RUE, and fair balance with self care.  Problem List:  Patient Active Problem List  Diagnosis  . HYPERLIPIDEMIA  . HYPERTENSION  . HEMORRHOIDS, INTERNAL  . GERD  . HIATAL HERNIA  . DIVERTICULOSIS, COLON  . ROSACEA  . OSTEOPOROSIS  . PROSTATE SPECIFIC ANTIGEN, ELEVATED  . Dry skin  . Cellulitis of hand, right  . Medicare annual wellness visit, subsequent  . Abdominal pain  . Compression fracture  . Hypercalcemia  . Anemia  . Urgency of urination  . Constipation due to pain medication  . Dyspnea  . Right sided weakness  . Bradycardia  . Hypokalemia  . Second degree AV block  . Hypotension  . Acute ischemic stroke  . CVA (cerebral infarction)    Past Medical History:  Past Medical History  Diagnosis Date  . GERD (gastroesophageal reflux disease) 08/1996  . Hyperlipemia 08/1986  . Hypertension Pre 1996  . Osteoporosis 12/2004  . Pneumonia 22 yoa  . History of Doppler echocardiogram 05/1995    MVP, MOD AI, MILD TR, MILD MR  . Elevated PSA     decided against further eval  . Diverticulosis   . Bilateral sensorineural hearing loss 02/2012    some conductive left side  . Compression fracture     multiple  . AV block, 2nd degree 04/2012   Past Surgical History:  Past Surgical History  Procedure Date  . Cholecystectomy 05/1995  . Exercise treadmill 09/12/2006    NML  . Inguinal hernia repair 08/14/2010    Dr. Daphine Deutscher  .  Esophagogastroduodenoscopy 08/18/2004    [facility] Dr. Russella Dar  . Colonoscopy 05/01/2007    divericulosis, int hemorrhoids - Dr. Russella Dar    Assessment & Plan Clinical Impression:  [name] is a 78 y.o. right-handed male with history of hypertension as well as hyperlipidemia and chronic back pain with associated vertebral fractures. Patient recently discharged from the hospital with a T.-11 compression fracture. Admitted 04/15/2012 with right side weakness and slurred speech. 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 Patient did not receive TPA. Echocardiogram with ejection fraction of 60% and no regional wall motion abnormalities. Carotid Dopplers showed no ICA stenosis. Neurology services consulted placed on Plavix therapy as well as aspirin for CVA prophylaxis as well as subcutaneous Lovenox for DVT prophylaxis. Bouts of orthostatic hypotension received fluid bolus and monitored. Cardiology services consulted 04/17/2012 Dr. Johney Frame in reference to telemetry monitor showing episodes of AV block. It was felt that this episodic AV block represented advanced degeneration of his conduction system at some point in the future possibly may need a pacemaker. He was advised conservative care for now and monitor from a distance as patient was asymptomatic. Speech therapy followup maintain on a dysphagia 1 nectar thick liquid diet. Patient continues to wear a back brace from recent thoracic compression fracture that  could be applied in the sitting position.  Patient transferred to CIR on 04/19/2012 .    Patient currently requires max with basic self-care skills secondary to muscle weakness, decreased coordination and decreased standing balance.  Prior to hospitalization, patient could complete ADLs independently.  Patient will benefit from skilled intervention to increase independence  with basic self-care skills prior to discharge home with care partner.  Anticipate patient will require intermittent supervision and follow up home health.  OT - End of Session Activity Tolerance: Tolerates 10 - 20 min activity with multiple rests OT Assessment Rehab Potential: Good Barriers to Discharge: None OT Plan OT Frequency: 1-2 X/day, 60-90 minutes;5 out of 7 days Estimated Length of Stay: 2 weeks OT Treatment/Interventions: Balance/vestibular training;Discharge planning;Community reintegration;DME/adaptive equipment instruction;Functional mobility training;Patient/family education;Self Care/advanced ADL retraining;Therapeutic Activities;Therapeutic Exercise;UE/LE Strength taining/ROM;UE/LE Coordination activities OT Recommendation Follow Up Recommendations: Home health OT Equipment Recommended: Tub/shower bench  OT Evaluation Precautions/Restrictions  Precautions Precautions: Back;Fall Required Braces or Orthoses: Spinal Brace Spinal Brace: Thoracolumbosacral orthotic Restrictions Weight Bearing Restrictions: No Vital Signs Therapy Vitals Resp:  (pt dyspneic 2/4 in sitting, wearing TLSO without exertion) Pain Pain Assessment Pain Assessment: 0-10 Pain Score:   2 Pain Type: Chronic pain Pain Location: Back Pain Orientation: Lower Pain Descriptors: Aching Pain Frequency: Occasional Pain Onset: Gradual Patients Stated Pain Goal: 2 Pain Intervention(s): Medication (See eMAR) Multiple Pain Sites: No Home Living/Prior Functioning Home Living Lives With: Alone Available Help at Discharge: Family Type of Home: House Home Access:  (sidewalk to house) Home Layout: One level Firefighter: Standard Home Adaptive Equipment: Environmental consultant - rolling Prior Function Level of Independence:  (independent prior to T11 fx 2 weeks ago) Able to Take Stairs?: Yes Driving: Yes Vocation: Retired Comments: likes to sit in the back yard ADL Refer to [place]   Vision/Perception  Vision  - History Baseline Vision: Wears glasses only for reading Patient Visual Report: No change from baseline Vision - Assessment Eye Alignment: Within Chemical engineer Perception: Within Functional Limits Praxis Praxis: Intact  Cognition Overall Cognitive Status: Appears within functional limits for tasks assessed Orientation Level: Oriented X4 Sensation Sensation Light Touch: Appears Intact Stereognosis: Appears Intact Hot/Cold: Appears Intact Proprioception: Appears Intact Coordination Gross Motor Movements are Fluid and Coordinated: No Fine Motor Movements are Fluid and Coordinated: Yes Heel Shin Test: decreased speed, accuracy and excursion Motor  Motor Motor: Hemiplegia Mobility  Bed Mobility Bed Mobility: Rolling Right;Rolling Left;Right Sidelying to Sit Rolling Right: 5: Supervision Rolling Left: 5: Supervision Rolling Left Details: Verbal cues for technique Right Sidelying to Sit: 4: Min assist Right Sidelying to Sit Details: Manual facilitation for placement Transfers Sit to Stand: 4: Min assist;With upper extremity assist;With armrests;From bed Stand to Sit: 4: Min assist  Trunk/Postural Assessment  Cervical Assessment Cervical Assessment: Within Functional Limits Thoracic Assessment Thoracic Assessment: Exceptions to [facility] (kyphotic; ) Lumbar Assessment Lumbar Assessment: Exceptions to [facility] Postural Control Postural Control: Within Functional Limits  Balance Balance Balance Assessed: Yes Static Sitting Balance Static Sitting - Level of Assistance: 7: Independent Static Standing Balance Static Standing - Level of Assistance: 5: Stand by assistance Dynamic Standing Balance Dynamic Standing - Level of Assistance: 4: Min assist Extremity/Trunk Assessment RUE Assessment RUE Assessment: Within Functional Limits (movement is slower, but able to use it functionally) LUE Assessment LUE Assessment: Within Functional Limits  See FIM for current  functional status Refer to Care Plan for Long Term Goals  Recommendations for other services: None  Discharge Criteria: Patient will be discharged from OT  if patient refuses treatment 3 consecutive times without medical reason, if treatment goals not met, if there is a change in medical status, if patient makes no progress towards goals or if patient is discharged from hospital.  The above assessment, treatment plan, treatment alternatives and goals were discussed and mutually agreed upon: by patient  [facility] 04/20/2012, 11:48 AM

## 2012-04-20 NOTE — Care Management Note (Signed)
    Page 1 of 1   04/20/2012     9:04:11 AM   CARE MANAGEMENT NOTE 04/20/2012  Patient:  Christopher [name], Christopher [name]   Account Number:  [id number]  Date Initiated:  04/17/2012  Documentation initiated by:  [facility]  Subjective/Objective Assessment:   Admmitted to ICU with CVA  Had [facility] RN, PT, and OT.  Has rolling walker.     Action/Plan:   PT/OT evals-recommending inpatient rehab   Anticipated DC Date:  04/20/2012   Anticipated DC Plan:  IP REHAB FACILITY      DC Planning Services  CM consult      Choice offered to / List presented to:             Status of service:  Completed, signed off Medicare Important Message given?   (If response is "NO", the following Medicare IM given date fields will be blank) Date Medicare IM given:   Date Additional Medicare IM given:    Discharge Disposition:  IP REHAB FACILITY  Per UR Regulation:  Reviewed for med. necessity/level of care/duration of stay  If discussed at Long Length of Stay Meetings, dates discussed:    Comments:  04/20/12 discharged to inpatient rehab on 04/19/12

## 2012-04-21 ENCOUNTER — Inpatient Hospital Stay (HOSPITAL_COMMUNITY): Payer: Medicare Other | Admitting: Speech Pathology

## 2012-04-21 ENCOUNTER — Inpatient Hospital Stay (HOSPITAL_COMMUNITY): Payer: Medicare Other

## 2012-04-21 ENCOUNTER — Telehealth: Payer: Self-pay

## 2012-04-21 ENCOUNTER — Inpatient Hospital Stay (HOSPITAL_COMMUNITY): Payer: Medicare Other | Admitting: Occupational Therapy

## 2012-04-21 DIAGNOSIS — I69993 Ataxia following unspecified cerebrovascular disease: Secondary | ICD-10-CM

## 2012-04-21 DIAGNOSIS — I6330 Cerebral infarction due to thrombosis of unspecified cerebral artery: Secondary | ICD-10-CM

## 2012-04-21 DIAGNOSIS — Z5189 Encounter for other specified aftercare: Secondary | ICD-10-CM

## 2012-04-21 MED ORDER — NYSTATIN 100000 UNIT/ML MT SUSP
5.0000 mL | Freq: Four times a day (QID) | OROMUCOSAL | Status: AC
  Administered 2012-04-21 – 2012-04-27 (×27): 500000 [IU] via ORAL
  Filled 2012-04-21 (×28): qty 5

## 2012-04-21 NOTE — Progress Notes (Signed)
Physical Therapy Session Note  Patient Details  Name: NESTER BACHUS MRN: [id number] Date of Birth: [birth year]  Today's Date: 04/21/2012 Time: 1005-1100 Time Calculation (min): 55 min  Short Term Goals: Week 1:  PT Short Term Goal 1 (Week 1):  pt will transfer to L and R w/c>< bed wearing TLSO with supervision PT Short Term Goal 2 (Week 1): pt will perform gait wearing TLSO x 150' with supervision PT Short Term Goal 3 (Week 1): pt will ascend and descend 4 steps with 1 rail with supervision PT Short Term Goal 4 (Week 1): wearing TLSO, pt will tolerate standing and reaching out of base of support x 3 minutes during functional activity with supervision PT Short Term Goal 5 (Week 1): pt will demonstrate 3/3 back precautions during functional activities, with 0-1 cues  Skilled Therapeutic Interventions/Progress Updates:  neuromuscular re-education via demo, visual feedback, tactile and VCs for LLE re-education during seated exs: alternating hip flexion, bil hip abduction using green theratube.  Sit>< stand x 5 with bias toward LLE = increased wt bearing . In standing, bil UE support on rail, closed chain hip abduction with limited hip external rotation, x 8 x 1.  Pt with poor coordination for laterally pushing with abductors.  Gait with RW x 95' , x 10' x 2 with min assist, focusing on upright posture, forward gaze, wider stance.  Pt tends to walk with very narrow stance, feet almost brushing against each other.    Kinetron in standing to facilitate forced use of LLE to increase wt bearing, x 3 minutes, slowly.    Dynamic standing balance on compliant Airex foam mat.  Pt required max assist for balance due to LOB backwards 8/10 reps calf raises.  Pt with slow and inadequate ankle strategy, absent hip strategy.     Pt dyspneic 2/4 after each activity, requiring seated rest breaks.       Therapy Documentation Precautions:  Precautions Precautions: Fall;Back Required Braces or Orthoses: Spinal  Brace Spinal Brace: Thoracolumbosacral orthotic Restrictions Weight Bearing Restrictions: No   Pain: Pain Assessment Pain Assessment: Faces Pain Score:   2 Pain Location: Back Pain Orientation: Lower RN aware, pre-medicated     See FIM for current functional status  Therapy/Group: Individual Therapy  Brendia Dampier 04/21/2012, 3:36 PM

## 2012-04-21 NOTE — Progress Notes (Signed)
Occupational Therapy Session Note  Patient Details  Name: Christopher [name] MRN: [id number] Date of Birth: 1924-05-20  Today's Date: 04/21/2012 Time: 0900-1000 Time Calculation (min): 60 min  Short Term Goals: Week 1:  OT Short Term Goal 1 (Week 1): Pt will be able to don pants over feet with supervision. OT Short Term Goal 2 (Week 1): Pt will don socks with min assist. OT Short Term Goal 3 (Week 1): Pt will don shoes with min to mod assist. OT Short Term Goal 4 (Week 1): Pt will ambulated to toilet with RW with supervision. OT Short Term Goal 5 (Week 1): Pt will toilet with supervision.  Skilled Therapeutic Interventions/Progress Updates:   Pt seen for BADL retraining of  Bathing at shower level and dressing with a focus on functional mobility and standing balance.  Pt did extremely well ambulating to shower with RW with steady assist.  Pt will need a long sponge to wash feet for back precautions or he could try to cross his legs.  Pt was introduced to use of a reacher, which he was able to use well along with crossing his legs.  The sock aide did not work well with the type of socks that he had.  No support needed when he was standing to pull pants over hips. Improved balance.     Therapy Documentation Precautions:  Precautions Precautions: Back;Fall Required Braces or Orthoses: Spinal Brace Spinal Brace: Thoracolumbosacral orthotic Restrictions Weight Bearing Restrictions: No  Pain: 5/10 back, received meds earlier   ADL:  See FIM for current functional status  Therapy/Group: Individual Therapy  Jolissa Kapral 04/21/2012, 11:42 AM

## 2012-04-21 NOTE — Telephone Encounter (Signed)
I passed by and saw pt today. R hemiparesis is improving well. In good spirits.

## 2012-04-21 NOTE — Progress Notes (Signed)
Patient ID: Christopher [name], male   DOB: [birth year], 78 y.o.   MRN: [id number]  Subjective/Complaints: I want to shave. Discussed pt ok for electric shaver Review of Systems  Respiratory: Positive for shortness of breath. Negative for cough, hemoptysis, sputum production and wheezing.        Nocturnal  All other systems reviewed and are negative.   Objective: Vital Signs: Blood pressure 129/86, pulse 69, temperature 97.6 F (36.4 C), temperature source Oral, resp. rate 18, height 5\' 11"  (1.803 m), weight 68.7 kg (151 lb 7.3 oz), SpO2 95.00%. No results found. Results for orders placed during the hospital encounter of 04/19/12 (from the past 72 hour(s))  CBC WITH DIFFERENTIAL     Status: Abnormal   Collection Time   04/20/12  5:10 AM      Component Value Range Comment   WBC 9.2  4.0 - 10.5 K/uL    RBC 4.05 (*) 4.22 - 5.81 MIL/uL    Hemoglobin 12.8 (*) 13.0 - 17.0 g/dL    HCT 40.9 (*) 81.1 - 52.0 %    MCV 93.8  78.0 - 100.0 fL    MCH 31.6  26.0 - 34.0 pg    MCHC 33.7  30.0 - 36.0 g/dL    RDW 91.4  78.2 - 95.6 %    Platelets 250  150 - 400 K/uL    Neutrophils Relative 67  43 - 77 %    Neutro Abs 6.2  1.7 - 7.7 K/uL    Lymphocytes Relative 19  12 - 46 %    Lymphs Abs 1.8  0.7 - 4.0 K/uL    Monocytes Relative 11  3 - 12 %    Monocytes Absolute 1.0  0.1 - 1.0 K/uL    Eosinophils Relative 3  0 - 5 %    Eosinophils Absolute 0.2  0.0 - 0.7 K/uL    Basophils Relative 0  0 - 1 %    Basophils Absolute 0.0  0.0 - 0.1 K/uL   COMPREHENSIVE METABOLIC PANEL     Status: Abnormal   Collection Time   04/20/12  5:10 AM      Component Value Range Comment   Sodium 142  135 - 145 mEq/L    Potassium 3.7  3.5 - 5.1 mEq/L    Chloride 106  96 - 112 mEq/L    CO2 26  19 - 32 mEq/L    Glucose, Bld 112 (*) 70 - 99 mg/dL    BUN 27 (*) 6 - 23 mg/dL    Creatinine, Ser 2.13  0.50 - 1.35 mg/dL    Calcium 9.5  8.4 - 08.6 mg/dL    Total Protein 7.1  6.0 - 8.3 g/dL    Albumin 3.0 (*) 3.5 - 5.2 g/dL    AST 18   0 - 37 U/L    ALT 25  0 - 53 U/L    Alkaline Phosphatase 65  39 - 117 U/L    Total Bilirubin 0.8  0.3 - 1.2 mg/dL    GFR calc non Af Amer 63 (*) >90 mL/min    GFR calc Af Amer 73 (*) >90 mL/min   URINALYSIS, ROUTINE W REFLEX MICROSCOPIC     Status: Normal   Collection Time   04/20/12  9:02 AM      Component Value Range Comment   Color, Urine YELLOW  YELLOW    APPearance CLEAR  CLEAR    Specific Gravity, Urine 1.018  1.005 - 1.030  pH 5.5  5.0 - 8.0    Glucose, UA NEGATIVE  NEGATIVE mg/dL    Hgb urine dipstick NEGATIVE  NEGATIVE    Bilirubin Urine NEGATIVE  NEGATIVE    Ketones, ur NEGATIVE  NEGATIVE mg/dL    Protein, ur NEGATIVE  NEGATIVE mg/dL    Urobilinogen, UA 1.0  0.0 - 1.0 mg/dL    Nitrite NEGATIVE  NEGATIVE    Leukocytes, UA NEGATIVE  NEGATIVE MICROSCOPIC NOT DONE ON URINES WITH NEGATIVE PROTEIN, BLOOD, LEUKOCYTES, NITRITE, OR GLUCOSE <1000 mg/dL.     HEENT: poor dentition Cardio: RRR Resp: CTA B/L GI: BS positive Extremity:  Pulses positive and No Edema Skin:   Intact Neuro: Alert/Oriented Musc/Skel:  Normal Motor 4/5 on R side, 5/5 on L side Sensory- intact light touch BUE and BLE Cerebellar no ataxia  Assessment/Plan: 1. Functional deficits secondary to L pontomedullary infarct with balance d/o which require 3+ hours per day of interdisciplinary therapy in a comprehensive inpatient rehab setting. Physiatrist is providing close team supervision and 24 hour management of active medical problems listed below. Physiatrist and rehab team continue to assess barriers to discharge/monitor patient progress toward functional and medical goals. FIM: FIM - Bathing Bathing Steps Patient Completed: Chest;Right Arm;Left Arm;Abdomen;Front perineal area;Buttocks;Right upper leg;Left upper leg Bathing: 4: Min-Patient completes 8-9 60f 10 parts or 75+ percent  FIM - Upper Body Dressing/Undressing Upper body dressing/undressing steps patient completed: Thread/unthread right sleeve  of pullover shirt/dresss;Thread/unthread left sleeve of pullover shirt/dress;Put head through opening of pull over shirt/dress;Pull shirt over trunk Upper body dressing/undressing: 5: Set-up assist to: Apply TLSO, cervical collar FIM - Lower Body Dressing/Undressing Lower body dressing/undressing: 1: Total-Patient completed less than 25% of tasks  FIM - Toileting Toileting steps completed by patient: Performs perineal hygiene Toileting: 2: Max-Patient completed 1 of 3 steps  FIM - Toilet Transfers Toilet Transfers: 4-To toilet/BSC: Min A (steadying Pt. > 75%);4-From toilet/BSC: Min A (steadying Pt. > 75%)  FIM - Bed/Chair Transfer Bed/Chair Transfer Assistive Devices: Bed rails;Arm rests Bed/Chair Transfer: 4: Supine > Sit: Min A (steadying Pt. > 75%/lift 1 leg);4: Sit > Supine: Min A (steadying pt. > 75%/lift 1 leg);3: Bed > Chair or W/C: Mod A (lift or lower assist);3: Chair or W/C > Bed: Mod A (lift or lower assist)  FIM - Locomotion: Wheelchair Distance: 25 Locomotion: Wheelchair: 1: Travels less than 50 ft with minimal assistance (Pt.>75%) FIM - Locomotion: Ambulation Locomotion: Ambulation Assistive Devices: Walker - Rolling (pt used RW prior to CVA due to T11) Ambulation/Gait Assistance: 4: Min assist Locomotion: Ambulation: 1: Travels less than 50 ft with minimal assistance (Pt.>75%)  Comprehension Comprehension Mode: Auditory Comprehension: 6-Follows complex conversation/direction: With extra time/assistive device  Expression Expression Mode: Verbal Expression: 6-Expresses complex ideas: With extra time/assistive device  Social Interaction Social Interaction: 6-Interacts appropriately with others with medication or extra time (anti-anxiety, antidepressant).  Problem Solving Problem Solving: 6-Solves complex problems: With extra time  Memory Memory: 6-More than reasonable amt of time  Medical Problem List and Plan:  1. Acute brainstem infarct at the left  pontomedullary junction felt to be thrombotic secondary to high-grade proximal basilar terminal left vertebral stenosis  2. DVT Prophylaxis/Anticoagulation: Subcutaneous Lovenox. Monitor platelet counts any signs of bleeding  3. Pain Management: Oxycodone and Robaxin as needed. Monitor the increased mobility  -add voltaren gel to right wrist, ? Mild OA  -back brace  4. Neuropsych: This patient is capable of making decisions on his/her own behalf.  5. Recent thoracic T11 compression fracture.  Conservative care with a back brace when out of bed  6. Hypertension/second degree AV block/sinus bradycardia. Telemetry monitor has been stable. Followup per cardiology services with conservative care. Avoid AV nodal blocking agents. Monitor for any signs of orthostasis  7. Dysphagia. Dysphagia 1 nectar liquid diet. Followup per speech therapy. Monitor for any signs of aspiration  8. GERD. Protonix  LOS (Days) 2 A FACE TO FACE EVALUATION WAS PERFORMED  Cora Stetson E 04/21/2012, 8:08 AM

## 2012-04-21 NOTE — Progress Notes (Signed)
Speech Language Pathology Daily Session Note  Patient Details  Name: JEDRICK HUTCHERSON MRN: [id number] Date of Birth: 1923-10-31  Today's Date: 04/21/2012 Time: 4098-1191 Time Calculation (min): 60 min  Short Term Goals: Week 1: SLP Short Term Goal 1 (Week 1): Patient will tolerate Dys. 2 textures and nectar thick liquids with supervision cues to recall safe swallowing strategies with no overt s/s of aspiration.   SLP Short Term Goal 2 (Week 1): Patient will problem solve complex functional tasks with min assist verbal cues.   SLP Short Term Goal 3 (Week 1): Patient will identify at least two strategies to improve speech intelligibility with supervision cues.   SLP Short Term Goal 4 (Week 1): Patient will utilize speech intelligibility strategies at the conversational level with supervision cues.   SLP Short Term Goal 5 (Week 1): Patient will answer questions with no more than 2 verbal cues to stay on topic.    Skilled Therapeutic Interventions: Treatment session focused on addressing speech intelligibly and dysphagia goals.  SLP facilitated session with setup of oral care and supervision cues to complete thoroughly, trials of thin liquids via cup resulted in cough x1 and 3 throat clears during consumption of 4 oz.  After educating patient regarding water protocol he required supervision cues to repeat steps to SLP.  At this time, SLP recommends initiation of water protocol.  SLP also facilitated session with max assist verbal, visual and demonstrate cues to perform diaphragmatic breathing to support increased vocal intensity, which will in turn increase speech intelligibility.     FIM:  Comprehension Comprehension Mode: Auditory Comprehension: 6-Follows complex conversation/direction: With extra time/assistive device Expression Expression Mode: Verbal Expression: 3-Expresses basic 50 - 74% of the time/requires cueing 25 - 50% of the time. Needs to repeat parts of sentences. Social  Interaction Social Interaction: 6-Interacts appropriately with others with medication or extra time (anti-anxiety, antidepressant). Problem Solving Problem Solving: 5-Solves basic 90% of the time/requires cueing < 10% of the time Memory Memory: 5-Recognizes or recalls 90% of the time/requires cueing < 10% of the time FIM - Eating Eating Activity: 5: Supervision/cues;5: Set-up assist for open containers  Pain Pain Assessment Pain Assessment: No/denies pain  Therapy/Group: Individual Therapy  Charlane Ferretti., CCC-SLP [phone]  Kha Hari 04/21/2012, 4:50 PM

## 2012-04-21 NOTE — Telephone Encounter (Signed)
Christopher [name] called to let Dr Sharen Hones know pt was admitted to [facility] 04/15/12 with stroke on right side. Pt moved to rehab 04/20/12 for approx 2 weeks. Pt is doing a little better.[name] cancelled appt for bone density and will reschedule later.

## 2012-04-22 ENCOUNTER — Inpatient Hospital Stay (HOSPITAL_COMMUNITY): Payer: Medicare Other

## 2012-04-22 ENCOUNTER — Inpatient Hospital Stay (HOSPITAL_COMMUNITY): Payer: Medicare Other | Admitting: Occupational Therapy

## 2012-04-22 ENCOUNTER — Ambulatory Visit (HOSPITAL_COMMUNITY): Payer: Medicare Other

## 2012-04-22 ENCOUNTER — Inpatient Hospital Stay (HOSPITAL_COMMUNITY): Payer: Medicare Other | Admitting: Speech Pathology

## 2012-04-22 LAB — URINE CULTURE: Colony Count: 40000

## 2012-04-22 NOTE — Progress Notes (Signed)
Speech Language Pathology Daily Session Note  Patient Details  Name: Christopher [name] MRN: [id number] Date of Birth: Dec 27, 1923  Today's Date: 04/22/2012 Time: 7829-5621 Time Calculation (min): 50 min  Short Term Goals: Week 1: SLP Short Term Goal 1 (Week 1): Patient will tolerate Dys. 2 textures and nectar thick liquids with supervision cues to recall safe swallowing strategies with no overt s/s of aspiration.   SLP Short Term Goal 2 (Week 1): Patient will problem solve complex functional tasks with min assist verbal cues.   SLP Short Term Goal 3 (Week 1): Patient will identify at least two strategies to improve speech intelligibility with supervision cues.   SLP Short Term Goal 4 (Week 1): Patient will utilize speech intelligibility strategies at the conversational level with supervision cues.   SLP Short Term Goal 5 (Week 1): Patient will answer questions with no more than 2 verbal cues to stay on topic.    Skilled Therapeutic Interventions: SLP facilitated session with mod assist semantic cues during a categorical generation task in which the patient verbally identified objects he might find in a grocery store that began with corresponding letters of the alphabet.  Patient utilized speech intelligibility strategies (e.g. loud voice, slow rate of speech) with supervision level cues.  As a result, his intelligibility increased.  SLP also facilitated the session with direct verbal feedback to redirect the patient to maintain a topic of conversation with min assist.  SLP completed [place] water protocol with patient; patient completed oral care with supervision level cues.  Patient also consumed water with supervision level cues to utilize safe swallowing strategies and demonstrated no overt s/s of aspiration.    FIM:  Comprehension Comprehension Mode: Auditory Comprehension: 5-Understands basic 90% of the time/requires cueing < 10% of the time Expression Expression Mode: Verbal Expression:  5-Expresses basic needs/ideas: With extra time/assistive device Social Interaction Social Interaction: 6-Interacts appropriately with others with medication or extra time (anti-anxiety, antidepressant). Problem Solving Problem Solving: 4-Solves basic 75 - 89% of the time/requires cueing 10 - 24% of the time Memory Memory: 4-Recognizes or recalls 75 - 89% of the time/requires cueing 10 - 24% of the time FIM - Eating Eating Activity: 5: Supervision/cues  Pain Pain Assessment Pain Assessment: No/denies pain  Therapy/Group: Individual Therapy  Jackalyn Lombard, [place]  Graduate Clinician Speech Language Pathology  Page, Joni Reining 04/22/2012, 3:48 PM  The above skilled treatment note has been reviewed and SLP is in agreement. Fae Pippin, M.A., CCC-SLP [phone]

## 2012-04-22 NOTE — Progress Notes (Signed)
Patient ID: Christopher [name], male   DOB: 1924-07-11, 78 y.o.   MRN: [id number]  Subjective/Complaints: No c/o Review of Systems  Respiratory: Negative for cough.    Objective: Vital Signs: Blood pressure 157/90, pulse 66, temperature 98.3 F (36.8 C), temperature source Oral, resp. rate 18, height 5\' 11"  (1.803 m), weight 151 lb 7.3 oz (68.7 kg), SpO2 94.00%.    Elderly male- wearing back brace Chest CTA abd- soft non tender Ext- no edema  Assessment/Plan: 1. Functional deficits secondary to L pontomedullary infarct with balance d/o which require 3+ hours per day of interdisciplinary therapy in a comprehensive inpatient rehab setting. Physiatrist is providing close team supervision and 24 hour management of active medical problems listed below. Physiatrist and rehab team continue to assess barriers to discharge/monitor patient progress toward functional and medical goals.  Medical Problem List and Plan:  1. 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 DVT Prophylaxis/Anticoagulation: Subcutaneous Lovenox. Monitor platelet counts any signs of bleeding  3. Pain Management: Oxycodone and Robaxin as needed. Monitor the increased mobility  -add voltaren gel to right wrist, ? Mild OA  -back brace  4. Neuropsych: This patient is capable of making decisions on his/her own behalf.  5. Recent thoracic T11 compression fracture. Conservative care with a back brace when out of bed  6. Hypertension/second degree AV block/sinus bradycardia. Telemetry monitor has been stable. Followup per cardiology services with conservative care. Avoid AV nodal blocking agents. Monitor for any signs of orthostasis  7. Dysphagia. Dysphagia 1 nectar liquid diet. Followup per speech therapy. Monitor for any signs of aspiration  8. GERD. Protonix  LOS (Days) 3 A FACE TO FACE EVALUATION WAS  PERFORMED  SWORDS,BRUCE HENRY 04/22/2012, 9:06 AM

## 2012-04-22 NOTE — Progress Notes (Signed)
Occupational Therapy Session Note  Patient Details  Name: Christopher [name] MRN: [id number] Date of Birth: 1924/02/19  Today's Date: 04/22/2012 Time: 7829-5621 Time Calculation (min): 45 min  Skilled Therapeutic Interventions/Progress Updates: ADL in w/c at sink with focus on standing balance dynamically and weightbearing through R LE statically as well as bilateral UE integration along with demonstrating BAT precautions in sessions.  Patient also drank 6 oz water via water protocol.  Patient c/o fatigue and that the extreme pain was causing him to feel tired.  Educated patient that asking for pain medicine as soon as he notices the need might help the medication to resolve more of the pain.     Therapy Documentation Precautions:  Precautions Precautions: Fall;Back Required Braces or Orthoses: Spinal Brace Spinal Brace: Thoracolumbosacral orthotic Restrictions Weight Bearing Restrictions: No  Pain:10+/10    See FIM for current functional status  Therapy/Group: Individual Therapy  Bud Face [facility] 04/22/2012, 3:17 PM

## 2012-04-22 NOTE — Plan of Care (Signed)
Problem: RH BOWEL ELIMINATION Goal: RH STG MANAGE BOWEL WITH ASSISTANCE STG Manage Bowel continent mod independent with medications prn  Outcome: Not Progressing Incontinent of stool tonight

## 2012-04-22 NOTE — Progress Notes (Signed)
Physical Therapy Session Note  Patient Details  Name: Christopher [name] MRN: [id number] Date of Birth: November 29, 1923  Today's Date: 04/22/2012 Time: 1445-1530 Time Calculation (min): 45 min  Short Term Goals: Week 1:  PT Short Term Goal 1 (Week 1):  pt will transfer to L and R w/c>< bed wearing TLSO with supervision PT Short Term Goal 2 (Week 1): pt will perform gait wearing TLSO x 150' with supervision PT Short Term Goal 3 (Week 1): pt will ascend and descend 4 steps with 1 rail with supervision PT Short Term Goal 4 (Week 1): wearing TLSO, pt will tolerate standing and reaching out of base of support x 3 minutes during functional activity with supervision PT Short Term Goal 5 (Week 1): pt will demonstrate 3/3 back precautions during functional activities, with 0-1 cues  Skilled Therapeutic Interventions/Progress Updates:   Pt participated in group ambulation/balance.  Gait with RW x 100' x 2, through obstacle course of cones to step around, and canes to step over.   Up/down 5 steps with 2 rails, close supervision, step through pattern.     Kicking a slowly rolling ball with R foot to force use of LLE in stance, 1UE support  Calf raises while standing on compliant Aire[employer] mat, mod assist for balance.     Therapy Documentation Precautions:  Precautions Precautions: Fall;Back Required Braces or Orthoses: Spinal Brace Spinal Brace: Thoracolumbosacral orthotic Restrictions Weight Bearing Restrictions: No General: Amount of Missed PT Time (min): 15 Minutes Missed Time Reason: Nursing care Pain: Pain Assessment Pain Assessment: No/denies pain Pain Score:   8 Pain Location: Back Pain Orientation: Lower Pain Intervention(s): RN made aware     See FIM for current functional status  Therapy/Group: Group Therapy  Bakary Bramer 04/22/2012, 4:24 PM

## 2012-04-22 NOTE — Progress Notes (Signed)
Physical Therapy Session Note  Patient Details  Name: Christopher [name] MRN: [id number] Date of Birth: [birth year]  Today's Date: 04/22/2012 Time: 1005-1040 Time Calculation (min): 35 min  Short Term Goals: Week 1:  PT Short Term Goal 1 (Week 1):  pt will transfer to L and R w/c>< bed wearing TLSO with supervision PT Short Term Goal 2 (Week 1): pt will perform gait wearing TLSO x 150' with supervision PT Short Term Goal 3 (Week 1): pt will ascend and descend 4 steps with 1 rail with supervision PT Short Term Goal 4 (Week 1): wearing TLSO, pt will tolerate standing and reaching out of base of support x 3 minutes during functional activity with supervision PT Short Term Goal 5 (Week 1): pt will demonstrate 3/3 back precautions during functional activities, with 0-1 cues      Skilled Therapeutic Interventions/Progress Updates:   Pt able to state 0/3 back precautions.  Educated again, using acronym BAT.  Gait on carpet x 80' with close supervision/min assist, vcs to widen stance, using RW.Mild L hip instability noted.  Berg, score 23.  Sit>< stand repeatedly during [place], close supervision from higher mat.  When questioned about falls PTA, pt reported that he does not remember any, but MD following him for vertebral fxs believes there have been falls    Therapy Documentation Precautions:  Precautions Precautions: Fall;Back Required Braces or Orthoses: Spinal Brace Spinal Brace: Thoracolumbosacral orthotic Restrictions Weight Bearing Restrictions: No    Vital Signs: Therapy Vitals Pulse Rate: 48  Patient Position, if appropriate: Sitting Oxygen Therapy SpO2: 90 % (after balance activities, 2/4 dyspneic) O2 Device: None (Room air)    Balance: Balance Balance Assessed: Yes Standardized Balance Assessment Standardized Balance Assessment: Berg Balance Test Berg Balance Test Sit to Stand: Able to stand  independently using hands Standing Unsupported: Able to stand 2 minutes with  supervision Sitting with Back Unsupported but Feet Supported on Floor or Stool: Able to sit safely and securely 2 minutes Stand to Sit: Uses backs of legs against chair to control descent Transfers: Needs one person to assist Standing Unsupported with Eyes Closed: Able to stand 10 seconds with supervision Standing Ubsupported with Feet Together: Able to place feet together independently and stand for 1 minute with supervision From Standing, Reach Forward with Outstretched Arm: Reaches forward but needs supervision From Standing Position, Pick up Object from Floor: Unable to try/needs assist to keep balance From Standing Position, Turn to Look Behind Over each Shoulder: Needs supervision when turning Turn 360 Degrees: Needs close supervision or verbal cueing Standing Unsupported, Alternately Place Feet on Step/Stool: Able to complete >2 steps/needs minimal assist Standing Unsupported, One Foot in Front: Loses balance while stepping or standing Standing on One Leg: Unable to try or needs assist to prevent fall Total Score: 23        See FIM for current functional status  Therapy/Group: Individual Therapy  Smera Guyette 04/22/2012, 10:48 AM

## 2012-04-23 ENCOUNTER — Inpatient Hospital Stay (HOSPITAL_COMMUNITY): Payer: Medicare Other | Admitting: Physical Therapy

## 2012-04-23 MED ORDER — HYDROCHLOROTHIAZIDE 25 MG PO TABS
25.0000 mg | ORAL_TABLET | Freq: Every day | ORAL | Status: DC
  Administered 2012-04-23 – 2012-04-25 (×3): 25 mg via ORAL
  Filled 2012-04-23 (×5): qty 1

## 2012-04-23 MED ORDER — LISINOPRIL 20 MG PO TABS
20.0000 mg | ORAL_TABLET | Freq: Every day | ORAL | Status: DC
  Administered 2012-04-23 – 2012-04-26 (×4): 20 mg via ORAL
  Filled 2012-04-23 (×6): qty 1

## 2012-04-23 MED ORDER — CLONIDINE HCL 0.1 MG PO TABS
0.1000 mg | ORAL_TABLET | Freq: Once | ORAL | Status: AC
  Administered 2012-04-23: 0.1 mg via ORAL
  Filled 2012-04-23: qty 1

## 2012-04-23 NOTE — Progress Notes (Signed)
Patient ID: Christopher [name], male   DOB: [birth year], 78 y.o.   MRN: [id number]  Subjective/Complaints: Patient had some neck pain last night-- bilateral without radiation to arms. No trauma Review of Systems  Respiratory: Negative for cough.    Objective: Vital Signs: Blood pressure 150/98, pulse 85, temperature 98.7 F (37.1 C), temperature source Oral, resp. rate 18, height 5\' 11"  (1.803 m), weight 151 lb 7.3 oz (68.7 kg), SpO2 100.00%.  Elderly male- lying in bed NAD Chest CTA abd- soft non tender Ext- no edema Pain with rotation of neck and with extension/flexion  Assessment/Plan: 1. Functional deficits secondary to L pontomedullary infarct with balance d/o which require 3+ hours per day of interdisciplinary therapy in a comprehensive inpatient rehab setting. Physiatrist is providing close team supervision and 24 hour management of active medical problems listed below. Physiatrist and rehab team continue to assess barriers to discharge/monitor patient progress toward functional and medical goals.  Medical Problem List and Plan:  1. 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 DVT Prophylaxis/Anticoagulation: Subcutaneous Lovenox. Monitor platelet counts any signs of bleeding  3. Pain Management: Oxycodone and Robaxin as needed. Monitor the increased mobility  -add voltaren gel to right wrist, ? Mild OA  -back brace  4. Neuropsych: This patient is capable of making decisions on his/her own behalf.  5. Recent thoracic T11 compression fracture. Conservative care with a back brace when out of bed  6. Hypertension/second degree AV block/sinus bradycardia. Telemetry monitor has been stable. Followup per cardiology services with conservative care. Avoid AV nodal blocking agents. Monitor for any signs of orthostasis  7. Dysphagia. Dysphagia 1 nectar liquid diet. Followup per  speech therapy. Monitor for any signs of aspiration  8. GERD. Protonix Neck pain- I will not specifically address today. i suspect this is MSK pain related to therapy.   LOS (Days) 4 A FACE TO FACE EVALUATION WAS PERFORMED  Seema Blum HENRY 04/23/2012, 9:26 AM

## 2012-04-23 NOTE — Progress Notes (Signed)
Dr. Amador Cunas notified of patient c/o headache all shift 10/10 with Tylenol, oxy IR, and Robaxin given at different times; last oxy IR given at 2328; patient with BP 160/108 now with hx HTN; no scheduled BP meds this admission, but took Lisinopril with HCTZ 20/25 at home; hx AV block with possible pacemaker in the future; patient not to receive any AV blocking agents.  Orders received:  Resume Lisinopril 20 mg and HCTZ 25 mg q day; give Catapres 0.1 mg po now as a one time dose; give Oxy IR 5 mg po now.

## 2012-04-23 NOTE — Progress Notes (Signed)
Physical Therapy Note  Patient Details  Name: HILDA WEXLER MRN: [id number] Date of Birth: [birth year] Today's Date: 04/23/2012  1500 -1555 (55 minutes) group Pain: pt reports 5/10 headache/ neck ache/ premedicated Pt participated in PT group session focused on gait training/safety/endurance. Pt ambulates 80 feet X 3 with RW close SBA. Hot packs applied to neck during rest breaks.   Iyan Flett,JIM 04/23/2012, 8:02 AM

## 2012-04-24 ENCOUNTER — Inpatient Hospital Stay (HOSPITAL_COMMUNITY): Payer: Medicare Other | Admitting: Occupational Therapy

## 2012-04-24 ENCOUNTER — Inpatient Hospital Stay (HOSPITAL_COMMUNITY): Payer: Medicare Other | Admitting: Physical Therapy

## 2012-04-24 ENCOUNTER — Inpatient Hospital Stay (HOSPITAL_COMMUNITY): Payer: Medicare Other

## 2012-04-24 ENCOUNTER — Inpatient Hospital Stay (HOSPITAL_COMMUNITY): Payer: Medicare Other | Admitting: Speech Pathology

## 2012-04-24 LAB — GLUCOSE, CAPILLARY: Glucose-Capillary: 177 mg/dL — ABNORMAL HIGH (ref 70–99)

## 2012-04-24 MED ORDER — GABAPENTIN 600 MG PO TABS: 300.0000 mg | ORAL_TABLET | Freq: Two times a day (BID) | ORAL | Status: DC

## 2012-04-24 MED ORDER — POLYVINYL ALCOHOL 1.4 % OP SOLN
1.0000 [drp] | OPHTHALMIC | Status: DC | PRN
  Administered 2012-04-24 – 2012-05-02 (×2): 1 [drp] via OPHTHALMIC
  Filled 2012-04-24: qty 15

## 2012-04-24 MED ORDER — GABAPENTIN 300 MG PO CAPS
300.0000 mg | ORAL_CAPSULE | Freq: Two times a day (BID) | ORAL | Status: DC
  Administered 2012-04-24 – 2012-04-25 (×3): 300 mg via ORAL
  Filled 2012-04-24 (×5): qty 1

## 2012-04-24 NOTE — Progress Notes (Addendum)
Occupational Therapy Session Note  Patient Details  Name: Christopher [name] MRN: [id number] Date of Birth: 1923/09/25  Today's Date: 04/24/2012 Time: 1001-1100 Time Calculation (min): 59 min  Short Term Goals: Week 1:  OT Short Term Goal 1 (Week 1): Pt will be able to don pants over feet with supervision. OT Short Term Goal 2 (Week 1): Pt will don socks with min assist. OT Short Term Goal 3 (Week 1): Pt will don shoes with min to mod assist. OT Short Term Goal 4 (Week 1): Pt will ambulated to toilet with RW with supervision. OT Short Term Goal 5 (Week 1): Pt will toilet with supervision.  Skilled Therapeutic Interventions/Progress Updates:    Worked on bathing and dressing sit to stand at the sink.  Pt able to utilize AE for LB selfcare with min instructional cueing.  Min assist for sit to stand from the wheelchair.  Pt needs max assist to doff and donn TLSO in sitting.  Utilized reacher for donning pants and undergarment protector as well as doffing gripper socks.  Also utilized the reacher for bathing his lower legs as well.  Therapy Documentation Precautions:  Precautions Precautions: Back;Fall Precaution Comments: Pt able to recall 1/3 precautions.  Reeducated multiple times throughout the treatment session with no increase in carryover of the precautions.  Required Braces or Orthoses: Spinal Brace Spinal Brace: Thoracolumbosacral orthotic;Applied in sitting position Spinal Brace Comments: donn/doff in sitting/standing.   Restrictions Weight Bearing Restrictions: No   Pain: Pain Assessment Pain Assessment: No/denies pain Pain Score:   5 Pain Type: Acute pain Pain Location: Back Pain Orientation: Lower;Posterior Pain Descriptors: Aching Pain Frequency: Constant Pain Onset: On-going Patients Stated Pain Goal: 6 Pain Intervention(s): Repositioned;Medication (See eMAR) Multiple Pain Sites: No ADL: See FIM for current functional status  Therapy/Group: Individual  Therapy  Hector Venne OTR/L 04/24/2012, 12:13 PM

## 2012-04-24 NOTE — Progress Notes (Signed)
Physical Therapy Session Note  Patient Details  Name: Christopher [name] MRN: [id number] Date of Birth: Mar 13, 1924  Today's Date: 04/24/2012 Time: 0804-0900 Time Calculation (min): 56 min  Short Term Goals: Week 1:  PT Short Term Goal 1 (Week 1):  pt will transfer to L and R w/c>< bed wearing TLSO with supervision PT Short Term Goal 2 (Week 1): pt will perform gait wearing TLSO x 150' with supervision PT Short Term Goal 3 (Week 1): pt will ascend and descend 4 steps with 1 rail with supervision PT Short Term Goal 4 (Week 1): wearing TLSO, pt will tolerate standing and reaching out of base of support x 3 minutes during functional activity with supervision PT Short Term Goal 5 (Week 1): pt will demonstrate 3/3 back precautions during functional activities, with 0-1 cues  Skilled Therapeutic Interventions/Progress Updates:    This morning's session focused on reinforcing back precautions and safety.  The patient even after > 5 times reinforcing back precautions was unable to recall more than 1/3 precautions and even then, it was never the same precaution that he would successfully recall.  We worked on [employer] mobility, car transfer, stairs with both handrails, gait with RW reinforcing upright posture and safe transitions from sit to stand.  We finished with leg exercises in standing.  See details below.    Therapy Documentation Precautions:  Precautions Precautions: Back;Fall Precaution Comments: Pt able to recall 1/3 precautions.  Reeducated multiple times throughout the treatment session with no increase in carryover of the precautions.  Required Braces or Orthoses: Spinal Brace Spinal Brace: Thoracolumbosacral orthotic Spinal Brace Comments: donn/doff in sitting/standing.   Restrictions Weight Bearing Restrictions: No General:   Vital Signs: Therapy Vitals Pulse Rate: 100  Resp: 18  BP: 102/84 mmHg Patient Position, if appropriate: Sitting Pain: Pain Assessment Pain Assessment:  0-10 Pain Score:   6 Pain Type: Acute pain Pain Location: Head Pain Orientation: Lower;Posterior Pain Descriptors: Aching Pain Frequency: Constant Pain Onset: On-going Patients Stated Pain Goal: 6 Pain Intervention(s): Repositioned Multiple Pain Sites: No Mobility: Bed Mobility Rolling Right: 5: Supervision;With rail Rolling Right Details: Verbal cues for sequencing;Verbal cues for technique Right Sidelying to Sit: 5: Supervision;With rails;HOB flat Right Sidelying to Sit Details: Verbal cues for sequencing;Verbal cues for technique Sitting - Scoot to Edge of Bed: 5: Supervision Sitting - Scoot to [place] of Bed Details: Verbal cues for sequencing;Verbal cues for technique Transfers Sit to Stand: 4: Min assist;With upper extremity assist;From bed Sit to Stand Details: Verbal cues for technique;Verbal cues for precautions/safety;Manual facilitation for weight shifting Stand to Sit: 4: Min assist;With upper extremity assist;To chair/3-in-1;With armrests Stand to Sit Details (indicate cue type and reason): Verbal cues for precautions/safety;Verbal cues for sequencing;Manual facilitation for weight shifting Locomotion : Ambulation Ambulation: Yes Ambulation/Gait Assistance: 4: Min assist Ambulation Distance (Feet): 400 Feet (250, 150) Assistive device: Rolling walker Ambulation/Gait Assistance Details: Verbal cues for sequencing;Verbal cues for technique;Verbal cues for safe use of DME/AE;Tactile cues for posture Ambulation/Gait Assistance Details: min assist as pt fatigues due to decreased foot clearance and buckling right leg.   Stairs / Additional Locomotion Stairs: Yes Stairs Assistance: 4: Min assist Stairs Assistance Details: Verbal cues for precautions/safety;Verbal cues for technique;Verbal cues for sequencing;Manual facilitation for weight shifting Stair Management Technique: Two rails;Alternating pattern;Forwards Number of Stairs: 8  Height of Stairs: 6  Wheelchair  Mobility Wheelchair Mobility: Yes Wheelchair Assistance: 5: Supervision Wheelchair Assistance Details: Verbal cues for technique;Verbal cues for sequencing;Verbal cues for Engineer, drilling: Both upper  extremities;Both lower extermities Wheelchair Parts Management: Needs assistance Distance: 300     Exercises: General Exercises - Lower Extremity Hip ABduction/ADduction: AROM;10 reps;Standing;Both (abduction only) Hip Flexion/Marching: AROM;Both;10 reps;Standing Toe Raises: AROM;Both;10 reps;Standing Heel Raises: AROM;Both;10 reps;Standing Mini-Sqauts: AROM;Both;10 reps;Standing Other Treatments: Treatments Therapeutic Activity: car transfer from [facility] to medium height car with RW min assist with verbal cues for safety and sequencing.    See FIM for current functional status  Therapy/Group: Individual Therapy  Lurena Joiner B. Keylie Beavers, PT, DPT [phone]   04/24/2012, 10:17 AM

## 2012-04-24 NOTE — Progress Notes (Signed)
Speech Language Pathology Daily Session Note  Patient Details  Name: Christopher [name] MRN: [id number] Date of Birth: 1924-07-06  Today's Date: 04/24/2012 Time: 1191-4782 Time Calculation (min): 30 min  Short Term Goals: Week 1: SLP Short Term Goal 1 (Week 1): Patient will tolerate Dys. 2 textures and nectar thick liquids with supervision cues to recall safe swallowing strategies with no overt s/s of aspiration.   SLP Short Term Goal 2 (Week 1): Patient will problem solve complex functional tasks with min assist verbal cues.   SLP Short Term Goal 2 - Progress (Week 1): Progressing toward goal SLP Short Term Goal 3 (Week 1): Patient will identify at least two strategies to improve speech intelligibility with supervision cues.   SLP Short Term Goal 4 (Week 1): Patient will utilize speech intelligibility strategies at the conversational level with supervision cues.   SLP Short Term Goal 5 (Week 1): Patient will answer questions with no more than 2 verbal cues to stay on topic.   SLP Short Term Goal 5 - Progress (Week 1): Progressing toward goal  Skilled Therapeutic Interventions: Treatment focused on dysphagia treatment including therapuetic and pleasure thin water trials. Patient without recall of initiation of free water protocol requiring max SLP re-education.  Patient able to utilize SLP provided written instructions to complete oral care, including teeth brushing and oral rinse with min verbal cueing for use of correct products. Patient then able to consume thin water via cup with min assist for both recall, verbalization, and consistent use of compensatory strategies. Overall, making good progress with functional goals. Topic maintanence intact today during basic conversation.    FIM:  Comprehension Comprehension Mode: Auditory Comprehension: 5-Follows basic conversation/direction: With no assist Expression Expression Mode: Verbal Expression: 5-Expresses complex 90% of the time/cues < 10% of  the time Social Interaction Social Interaction: 6-Interacts appropriately with others with medication or extra time (anti-anxiety, antidepressant). Problem Solving Problem Solving: 4-Solves basic 75 - 89% of the time/requires cueing 10 - 24% of the time Memory Memory: 4-Recognizes or recalls 75 - 89% of the time/requires cueing 10 - 24% of the time  Pain Pain Assessment Pain Assessment: No/denies pain Pain Score:   5 Pain Type: Acute pain Pain Location: Back Pain Orientation: Lower;Posterior Pain Descriptors: Aching Pain Frequency: Constant Pain Onset: On-going Patients Stated Pain Goal: 6 Pain Intervention(s): Repositioned;Medication (See eMAR) Multiple Pain Sites: No  Therapy/Group: Individual Therapy  Aliece Honold Meryl 04/24/2012, 11:25 AM

## 2012-04-24 NOTE — Progress Notes (Signed)
Occupational Therapy Session Note  Patient Details  Name: Christopher [name] MRN: [id number] Date of Birth: May 01, 1924  Today's Date: 04/24/2012 Time: 1300-1330 Time Calculation (min): 30 min  Short Term Goals: Week 1:  OT Short Term Goal 1 (Week 1): Pt will be able to don pants over feet with supervision. OT Short Term Goal 2 (Week 1): Pt will don socks with min assist. OT Short Term Goal 3 (Week 1): Pt will don shoes with min to mod assist. OT Short Term Goal 4 (Week 1): Pt will ambulated to toilet with RW with supervision. OT Short Term Goal 5 (Week 1): Pt will toilet with supervision.  Skilled Therapeutic Interventions/Progress Updates:    Worked on Archivist and LH shoe horn from wheelchair.  Pt with increased difficulty donning shoes tied loosely so issued elastic shoe laces to remain tied and allow for greater space to slip on shoe.  Pt able to perform with increased time.  Therapy Documentation Precautions:  Precautions Precautions: Back;Fall Precaution Comments:  Required Braces or Orthoses: Spinal Brace Spinal Brace: Thoracolumbosacral orthotic;Applied in sitting position Spinal Brace Comments: donn/doff in sitting/standing.   Restrictions Weight Bearing Restrictions: No  Pain: Pain Assessment Pain Assessment: 0-10 Pain Score: 10-Worst pain ever Pain Location: Neck Pain Intervention(s): Medication (See eMAR) ADL: See FIM for current functional status  Therapy/Group: Individual Therapy  Markel Kurtenbach OTR/L 04/24/2012, 3:07 PM

## 2012-04-24 NOTE — Progress Notes (Signed)
Ordered Kpad from portable equipment for neck pain.  Currently hospital is out of available kpads.  Warm compress ordered as replacement until a kpad becomes available.  Patient on waiting list for kpads.  Patient resting comfortably.  Will continue to monitor.  Barrie Lyme 6:38 PM 04/24/2012

## 2012-04-24 NOTE — Progress Notes (Signed)
Patient ID: Christopher [name], male   DOB: August 29, 1923, 78 y.o.   MRN: [id number]  Subjective/Complaints: Pain starts at middle of nose and goes to R Ear Also neck pain no arm or leg pain Review of Systems  Respiratory: Positive for shortness of breath. Negative for cough, hemoptysis, sputum production and wheezing.        Nocturnal  All other systems reviewed and are negative.   Objective: Vital Signs: Blood pressure 104/69, pulse 64, temperature 98.2 F (36.8 C), temperature source Oral, resp. rate 18, height 5\' 11"  (1.803 m), weight 68.7 kg (151 lb 7.3 oz), SpO2 96.00%. No results found. No results found for this or any previous visit (from the past 72 hour(s)).   HEENT: poor dentition Cardio: RRR Resp: CTA B/L GI: BS positive Extremity:  Pulses positive and No Edema Skin:   Intact Neuro: Alert/Oriented Musc/Skel:  Normal Motor 4/5 on R side, 5/5 on L side Sensory- intact light touch BUE and BLE Cerebellar no ataxia  Assessment/Plan: 1. Functional deficits secondary to L pontomedullary infarct with balance d/o which require 3+ hours per day of interdisciplinary therapy in a comprehensive inpatient rehab setting. Physiatrist is providing close team supervision and 24 hour management of active medical problems listed below. Physiatrist and rehab team continue to assess barriers to discharge/monitor patient progress toward functional and medical goals. FIM: FIM - Bathing Bathing Steps Patient Completed: Chest;Right Arm;Left Arm;Abdomen;Front perineal area;Buttocks (deferred washing legs and feet today) Bathing: 4: Steadying assist  FIM - Upper Body Dressing/Undressing Upper body dressing/undressing steps patient completed: Thread/unthread right sleeve of pullover shirt/dresss;Thread/unthread left sleeve of pullover shirt/dress;Put head through opening of pull over shirt/dress;Pull shirt over trunk Upper body dressing/undressing: 0: Wears gown/pajamas-no public clothing FIM - Lower  Body Dressing/Undressing Lower body dressing/undressing steps patient completed: Pull underwear up/down;Pull pants up/down;Thread/unthread right underwear leg;Thread/unthread left underwear leg;Thread/unthread right pants leg;Thread/unthread left pants leg Lower body dressing/undressing: 0: Activity did not occur (already dressed before ADL)  FIM - Toileting Toileting steps completed by patient: Performs perineal hygiene Toileting: 0: Activity did not occur  FIM - Archivist Transfers: 0-Activity did not occur  FIM - Banker Devices: Bed rails;Arm rests Bed/Chair Transfer: 0: Activity did not occur  FIM - Locomotion: Wheelchair Distance: 25 Locomotion: Wheelchair: 1: Total Assistance/staff pushes wheelchair (Pt<25%) FIM - Locomotion: Ambulation Locomotion: Ambulation Assistive Devices: Walker - Rolling;Orthosis (TLSO) Ambulation/Gait Assistance: 4: Min assist Locomotion: Ambulation: 2: Travels 50 - 149 ft with minimal assistance (Pt.>75%)  Comprehension Comprehension Mode: Auditory Comprehension: 5-Understands basic 90% of the time/requires cueing < 10% of the time  Expression Expression Mode: Verbal Expression: 5-Expresses basic needs/ideas: With extra time/assistive device  Social Interaction Social Interaction: 6-Interacts appropriately with others with medication or extra time (anti-anxiety, antidepressant).  Problem Solving Problem Solving: 4-Solves basic 75 - 89% of the time/requires cueing 10 - 24% of the time  Memory Memory: 4-Recognizes or recalls 75 - 89% of the time/requires cueing 10 - 24% of the time  Medical Problem List and Plan:  1. 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 DVT Prophylaxis/Anticoagulation: Subcutaneous Lovenox. Monitor platelet counts any signs of bleeding  3. Pain  Management:Neck adn facial combination of neurogenic and musculoskeletal, add gabapentin -add voltaren gel to right wrist, ? Mild OA  -back brace  4. Neuropsych: This patient is capable of making decisions on his/her own behalf.  5. Recent  thoracic T11 compression fracture. Conservative care with a back brace when out of bed  6. Hypertension/second degree AV block/sinus bradycardia. Telemetry monitor has been stable. Followup per cardiology services with conservative care. Avoid AV nodal blocking agents. Monitor for any signs of orthostasis  7. Dysphagia. Dysphagia 1 nectar liquid diet. Followup per speech therapy. Monitor for any signs of aspiration  8. GERD. Protonix  LOS (Days) 5 A FACE TO FACE EVALUATION WAS PERFORMED  Christopher [name] E 04/24/2012, 8:09 AM

## 2012-04-24 NOTE — Progress Notes (Signed)
Informed Dan, PA about 10/10 neck pain for patient.  Explained how patient is taking oxycodone 5 mg almost every 4 hours for neck pain 10/10.  Ordered kpad.  Jesusita Oka stated he will evaluate pain management.  Awaiting orders from [facility] otherwise patient resting comfortably.  Kpad ordered from portable equipment.  Will continue to monitor.  Barrie Lyme 04/24/2012 1500

## 2012-04-25 ENCOUNTER — Inpatient Hospital Stay (HOSPITAL_COMMUNITY): Payer: Medicare Other | Admitting: Occupational Therapy

## 2012-04-25 ENCOUNTER — Inpatient Hospital Stay: Admission: RE | Admit: 2012-04-25 | Payer: Medicare Other | Source: Ambulatory Visit

## 2012-04-25 ENCOUNTER — Inpatient Hospital Stay (HOSPITAL_COMMUNITY): Payer: Medicare Other | Admitting: Speech Pathology

## 2012-04-25 ENCOUNTER — Inpatient Hospital Stay (HOSPITAL_COMMUNITY): Payer: Medicare Other

## 2012-04-25 DIAGNOSIS — R269 Unspecified abnormalities of gait and mobility: Secondary | ICD-10-CM

## 2012-04-25 DIAGNOSIS — K219 Gastro-esophageal reflux disease without esophagitis: Secondary | ICD-10-CM

## 2012-04-25 DIAGNOSIS — M8448XD Pathological fracture, other site, subsequent encounter for fracture with routine healing: Secondary | ICD-10-CM

## 2012-04-25 DIAGNOSIS — I10 Essential (primary) hypertension: Secondary | ICD-10-CM

## 2012-04-25 DIAGNOSIS — M810 Age-related osteoporosis without current pathological fracture: Secondary | ICD-10-CM

## 2012-04-25 MED ORDER — METHOCARBAMOL 500 MG PO TABS
750.0000 mg | ORAL_TABLET | Freq: Three times a day (TID) | ORAL | Status: DC | PRN
  Administered 2012-04-25 – 2012-05-02 (×2): 750 mg via ORAL
  Filled 2012-04-25 (×2): qty 2

## 2012-04-25 MED ORDER — GABAPENTIN 300 MG PO CAPS
300.0000 mg | ORAL_CAPSULE | Freq: Three times a day (TID) | ORAL | Status: DC
  Administered 2012-04-25 – 2012-05-03 (×24): 300 mg via ORAL
  Filled 2012-04-25 (×28): qty 1

## 2012-04-25 NOTE — Progress Notes (Signed)
Physical Therapy Weekly Progress Note  Patient Details  Name: Christopher [name] MRN: [id number] Date of Birth: 1924-03-30  Today's Date: 04/25/2012 Time: 4401-0272 Time Calculation (min): 40 min  Patient has met 0 of 5 short term goals.  He is progressing slowly.  Pt's neck and back pain limit his participation in therapy; he has memory deficits for recalling his back precatuions.  Patient continues to demonstrate the following deficits: strength, balance, memory, attention, awareness and therefore will continue to benefit from skilled PT intervention to enhance overall performance with activity tolerance, balance, postural control, ability to compensate for deficits, functional use of  left lower extremity, attention and awareness.  Patient progressing toward long term goals, slowly.    Continue plan of care, until pain is better managed, then reassess.  PT Short Term Goals Week 1:  PT Short Term Goal 1 (Week 1):  pt will transfer to L and R w/c>< bed wearing TLSO with supervision PT Short Term Goal 2 (Week 1): pt will perform gait wearing TLSO x 150' with supervision PT Short Term Goal 3 (Week 1): pt will ascend and descend 4 steps with 1 rail with supervision PT Short Term Goal 4 (Week 1): wearing TLSO, pt will tolerate standing and reaching out of base of support x 3 minutes during functional activity with supervision PT Short Term Goal 5 (Week 1): pt will demonstrate 3/3 back precautions during functional activities, with 0-1 cues  Skilled Therapeutic Interventions/Progress Updates:  Pt quite lethargic, often closing eyes during tx.  Daughter attended session.  Pt unable to recall any back precautions, even after prompting with acronym BAT demo.    "Warm up" exs in w/c prior to gait: alternating marching, knee extension, ankle pumps.  Gait with RW x 80' with min assist, max VCs for stance width, step length, upright posture, forward gaze.    Neuromuscular re-education L hip abduction  with closed chain ex, bil UE support in standing.  W/c mobility x 150' with supervision, using bil UEs.    Neck pain limiting participation.  RN aware.     Therapy Documentation Precautions:  Precautions Precautions: Fall;Back    Pain: Pain Assessment Pain Assessment: No/denies pain Pain Score:   7 Pain Type: Chronic pain Pain Location: Neck Pain Descriptors: Aching Pain Frequency: Intermittent Pain Onset: Progressive Patients Stated Pain Goal: 2 Pain Intervention(s): Medication (See eMAR) Multiple Pain Sites: No     See FIM for current functional status  Therapy/Group: Individual Therapy  Elijah Phommachanh 04/25/2012, 5:03 PM

## 2012-04-25 NOTE — Progress Notes (Signed)
Patient ID: Christopher [name], male   DOB: [birth year], 78 y.o.   MRN: [id number]  Subjective/Complaints: Pain starts at middle of nose and goes to R Ear Also neck pain no arm or leg pain Review of Systems  Respiratory: Positive for shortness of breath. Negative for cough, hemoptysis, sputum production and wheezing.        Nocturnal  All other systems reviewed and are negative.   Objective: Vital Signs: Blood pressure 107/73, pulse 63, temperature 98.2 F (36.8 C), temperature source Oral, resp. rate 17, height 5\' 11"  (1.803 m), weight 68.7 kg (151 lb 7.3 oz), SpO2 96.00%. Dg Cervical Spine 2-3 Views  04/24/2012  *RADIOLOGY REPORT*  Clinical Data: Right-sided neck pain, limited range of motion.  CERVICAL SPINE - 2-3 VIEW  Comparison: 04/16/2012 CT  Findings: Multilevel degenerative disc disease, most pronounced at C5-6.  No acute fracture or dislocation identified.  This examination does not include odontoid views, limiting evaluation of the craniocervical and C1-2 articulations.  However, their appearance was normal on recent CT.  There is atherosclerotic vascular calcifications.  IMPRESSION: Multilevel degenerative disc disease, most pronounced at C5-6.  No acute osseous finding.   Original Report Authenticated By: Waneta Martins, M.D.    Results for orders placed during the hospital encounter of 04/19/12 (from the past 72 hour(s))  GLUCOSE, CAPILLARY     Status: Abnormal   Collection Time   04/24/12  9:34 PM      Component Value Range Comment   Glucose-Capillary 177 (*) 70 - 99 mg/dL    Comment 1 Documented in Chart      Comment 2 Notify RN        HEENT: poor dentition Cardio: RRR Resp: CTA B/L GI: BS positive Extremity:  Pulses positive and No Edema Skin:   Intact Neuro: Alert/Oriented Musc/Skel:  Normal Motor 4/5 on R side, 5/5 on L side Sensory- intact light touch BUE and BLE Cerebellar no ataxia  Assessment/Plan: 1. Functional deficits secondary to L pontomedullary infarct  with balance d/o which require 3+ hours per day of interdisciplinary therapy in a comprehensive inpatient rehab setting. Physiatrist is providing close team supervision and 24 hour management of active medical problems listed below. Physiatrist and rehab team continue to assess barriers to discharge/monitor patient progress toward functional and medical goals. FIM: FIM - Bathing Bathing Steps Patient Completed: Chest;Right Arm;Left Arm;Abdomen;Front perineal area;Buttocks;Right upper leg;Left lower leg (including foot);Right lower leg (including foot);Left upper leg Bathing: 4: Steadying assist  FIM - Upper Body Dressing/Undressing Upper body dressing/undressing steps patient completed: Thread/unthread right sleeve of pullover shirt/dresss;Thread/unthread left sleeve of pullover shirt/dress;Put head through opening of pull over shirt/dress;Pull shirt over trunk Upper body dressing/undressing: 5: Set-up assist to: Apply TLSO, cervical collar FIM - Lower Body Dressing/Undressing Lower body dressing/undressing steps patient completed: Thread/unthread right underwear leg;Thread/unthread left underwear leg;Pull underwear up/down;Thread/unthread right pants leg;Thread/unthread left pants leg;Pull pants up/down;Don/Doff right sock;Don/Doff left sock;Don/Doff right shoe;Don/Doff left shoe (doffed shoes with reacher) Lower body dressing/undressing: 4: Min-Patient completed 75 plus % of tasks  FIM - Toileting Toileting steps completed by patient: Performs perineal hygiene Toileting: 0: Activity did not occur  FIM - Archivist Transfers: 0-Activity did not occur  FIM - Banker Devices: Environmental consultant;Bed rails;Orthosis Bed/Chair Transfer: 5: Supine > Sit: Supervision (verbal cues/safety issues);4: Bed > Chair or W/C: Min A (steadying Pt. > 75%);4: Chair or W/C > Bed: Min A (steadying Pt. > 75%)  FIM - Locomotion: Wheelchair Distance: 300 Locomotion:  Wheelchair: 5: Travels 150 ft or more: maneuvers on rugs and over door sills with supervision, cueing or coaxing FIM - Locomotion: Ambulation Locomotion: Ambulation Assistive Devices: Walker - Rolling;Orthosis Ambulation/Gait Assistance: 4: Min assist Locomotion: Ambulation: 4: Travels 150 ft or more with minimal assistance (Pt.>75%)  Comprehension Comprehension Mode: Auditory Comprehension: 5-Follows basic conversation/direction: With no assist  Expression Expression Mode: Verbal Expression: 5-Expresses complex 90% of the time/cues < 10% of the time  Social Interaction Social Interaction: 6-Interacts appropriately with others with medication or extra time (anti-anxiety, antidepressant).  Problem Solving Problem Solving: 4-Solves basic 75 - 89% of the time/requires cueing 10 - 24% of the time  Memory Memory: 4-Recognizes or recalls 75 - 89% of the time/requires cueing 10 - 24% of the time  Medical Problem List and Plan:  1. 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 DVT Prophylaxis/Anticoagulation: Subcutaneous Lovenox. Monitor platelet counts any signs of bleeding  3. Pain Management:Neck and facial combination of neurogenic and musculoskeletal, add gabapentin -add voltaren gel to right wrist, ? Mild OA  -back brace  4. Neuropsych: This patient is capable of making decisions on his/her own behalf.  5. Recent thoracic T11 compression fracture. Conservative care with a back brace when out of bed  6. Hypertension/second degree AV block/sinus bradycardia. Telemetry monitor has been stable. Followup per cardiology services with conservative care. Avoid AV nodal blocking agents. Monitor for any signs of orthostasis  7. Dysphagia. Dysphagia 1 nectar liquid diet. Followup per speech therapy. Monitor for any signs of aspiration  8. GERD. Protonix  LOS (Days) 6 A FACE TO  FACE EVALUATION WAS PERFORMED  Christopher [name] E 04/25/2012, 8:10 AM

## 2012-04-25 NOTE — Progress Notes (Signed)
Occupational Therapy Session Note  Patient Details  Name: SHAYAAN PARKE MRN: [id number] Date of Birth: Sep 05, 1923  Today's Date: 04/25/2012 Time: 1130-1145 Time Calculation (min): 15 min  Skilled Therapeutic Interventions/Progress Updates:    Pt seen in diners club today with focus on RUE functional use while eating. Pt required min assistance to open packages & set up his tray. Per pt & family member present, he enjoys cooking at home often has a hot meal at lunchtime.  Therapy Documentation Precautions:  Precautions Precautions: Back;Fall Precaution Comments: Pt able to recall 1/3 precautions.  Reeducated multiple times throughout the treatment session with no increase in carryover of the precautions.  Required Braces or Orthoses: Spinal Brace Spinal Brace: Thoracolumbosacral orthotic;Applied in sitting position Spinal Brace Comments: donn/doff in sitting/standing.   Restrictions Weight Bearing Restrictions: No   Pain: Pain Assessment Pain Assessment: No/denies pain Pain Score:   4 Pain Type: Chronic pain Pain Location: Neck Pain Descriptors: Aching Pain Frequency: Intermittent Pain Onset: Progressive Patients Stated Pain Goal: 2 Pain Intervention(s): Medication (See eMAR) Multiple Pain Sites: No       See FIM for current functional status  Therapy/Group: Group Therapy  Shaquoya Cosper Beth Dixon 04/25/2012, 1:32 PM

## 2012-04-25 NOTE — Progress Notes (Signed)
Speech Language Pathology Daily Session Note  Patient Details  Name: Christopher [name] MRN: [id number] Date of Birth: October 27, 1923  Today's Date: 04/25/2012 Time: 1500-1530 Time Calculation (min): 30 min  Short Term Goals: Week 1: SLP Short Term Goal 1 (Week 1): Patient will tolerate Dys. 2 textures and nectar thick liquids with supervision cues to recall safe swallowing strategies with no overt s/s of aspiration.   SLP Short Term Goal 2 (Week 1): Patient will problem solve complex functional tasks with min assist verbal cues.   SLP Short Term Goal 2 - Progress (Week 1): Progressing toward goal SLP Short Term Goal 3 (Week 1): Patient will identify at least two strategies to improve speech intelligibility with supervision cues.   SLP Short Term Goal 4 (Week 1): Patient will utilize speech intelligibility strategies at the conversational level with supervision cues.   SLP Short Term Goal 5 (Week 1): Patient will answer questions with no more than 2 verbal cues to stay on topic.   SLP Short Term Goal 5 - Progress (Week 1): Progressing toward goal  Skilled Therapeutic Interventions: Treatment session focused on addressing dysphagia goals with Dys.3 and thin liquid trials.  SLP facilitated session with min assist verbal cues to follow safe swallow compensatory strategies.  Patient displayed no overt s/s of aspiration during trials.    FIM:  Comprehension Comprehension Mode: Auditory Comprehension: 5-Follows basic conversation/direction: With no assist Expression Expression Mode: Verbal Expression: 5-Expresses complex 90% of the time/cues < 10% of the time Social Interaction Social Interaction: 6-Interacts appropriately with others with medication or extra time (anti-anxiety, antidepressant). Problem Solving Problem Solving: 5-Solves basic 90% of the time/requires cueing < 10% of the time Memory Memory: 5-Recognizes or recalls 90% of the time/requires cueing < 10% of the time FIM -  Eating Eating Activity: 5: Supervision/cues;5: Set-up assist for open containers  Pain Pain Assessment Pain Assessment: No/denies pain  Therapy/Group: Individual Therapy  Charlane Ferretti., CCC-SLP [phone]  Alizandra Loh 04/25/2012, 4:38 PM

## 2012-04-25 NOTE — Progress Notes (Signed)
Speech Language Pathology Daily Session Note  Patient Details  Name: Christopher [name] MRN: [id number] Date of Birth: [birth year]  Today's Date: 04/25/2012 Time: 4098-1191 Time Calculation (min): 35 min  Short Term Goals: Week 1: SLP Short Term Goal 1 (Week 1): Patient will tolerate Dys. 2 textures and nectar thick liquids with supervision cues to recall safe swallowing strategies with no overt s/s of aspiration.   SLP Short Term Goal 2 (Week 1): Patient will problem solve complex functional tasks with min assist verbal cues.   SLP Short Term Goal 2 - Progress (Week 1): Progressing toward goal SLP Short Term Goal 3 (Week 1): Patient will identify at least two strategies to improve speech intelligibility with supervision cues.   SLP Short Term Goal 4 (Week 1): Patient will utilize speech intelligibility strategies at the conversational level with supervision cues.   SLP Short Term Goal 5 (Week 1): Patient will answer questions with no more than 2 verbal cues to stay on topic.   SLP Short Term Goal 5 - Progress (Week 1): Progressing toward goal  Skilled Therapeutic Interventions: Co-treatment session with OT; SLP facilitated session with trials of Dys.3 textures during p.o. intake of Dys.2 and hnectar-thick liquids with no overt s/s of aspiration.  SLP recommends upgrade to Dys.3 textures and will assess liquids in next individual treatment.     FIM:  FIM - Eating Eating Activity: 5: Supervision/cues;5: Set-up assist for open containers  Pain Pain Assessment Pain Assessment: No/denies pain  Therapy/Group: Group Therapy  Charlane Ferretti., CCC-SLP [phone]  [name] 04/25/2012, 1:12 PM

## 2012-04-25 NOTE — Progress Notes (Signed)
Occupational Therapy Session Note  Patient Details  Name: Christopher [name] MRN: [id number] Date of Birth: [birth year]  Today's Date: 04/25/2012 Time: 5621-3086 Time Calculation (min): 57 min  Short Term Goals: Week 1    Skilled Therapeutic Interventions/Progress Updates:    Pt seen today for individual OT treatment session with focus on ADL & self care retraining for bathe/dress at sink level (pt declined shower) with A/E for increased lower body independence. Pt benefits from VC's/instructional cues for A/E use. He required VC/safety cues for back precautions as well.  Stand by assist/steady assist during standing activities at sink (ie. don/doff pants, adult diaper). Pt was Max A to don/doff TLSO in sitting & will benefit from increased focus on independence in this area.    Therapy Documentation Precautions:  Precautions Precautions: Back;Fall Precaution Comments: Pt able to recall 1/3 precautions.  Reeducated multiple times throughout the treatment session with no increase in carryover of the precautions.  Required Braces or Orthoses: Spinal Brace Spinal Brace: Thoracolumbosacral orthotic;Applied in sitting position Spinal Brace Comments: donn/doff in sitting/standing.   Restrictions Weight Bearing Restrictions: No         See FIM for current functional status  Therapy/Group: Individual Therapy  Alm Bustard 04/25/2012, 12:35 PM

## 2012-04-26 ENCOUNTER — Inpatient Hospital Stay (HOSPITAL_COMMUNITY): Payer: Medicare Other | Admitting: Occupational Therapy

## 2012-04-26 ENCOUNTER — Inpatient Hospital Stay (HOSPITAL_COMMUNITY): Payer: Medicare Other | Admitting: Speech Pathology

## 2012-04-26 ENCOUNTER — Inpatient Hospital Stay (HOSPITAL_COMMUNITY): Payer: Medicare Other | Admitting: Physical Therapy

## 2012-04-26 MED ORDER — HYDROCHLOROTHIAZIDE 12.5 MG PO CAPS
12.5000 mg | ORAL_CAPSULE | Freq: Every day | ORAL | Status: DC
  Administered 2012-04-26 – 2012-05-03 (×8): 12.5 mg via ORAL
  Filled 2012-04-26 (×9): qty 1

## 2012-04-26 NOTE — Progress Notes (Signed)
Speech Language Pathology Daily Session Note  Patient Details  Name: Christopher [name] MRN: [id number] Date of Birth: January 12, 1924  Today's Date: 04/26/2012 Time: 9562-1308 Time Calculation (min): 20 min  Short Term Goals: Week 1: SLP Short Term Goal 1 (Week 1): Patient will tolerate Dys. 2 textures and nectar thick liquids with supervision cues to recall safe swallowing strategies with no overt s/s of aspiration.   SLP Short Term Goal 2 (Week 1): Patient will problem solve complex functional tasks with min assist verbal cues.   SLP Short Term Goal 2 - Progress (Week 1): Progressing toward goal SLP Short Term Goal 3 (Week 1): Patient will identify at least two strategies to improve speech intelligibility with supervision cues.   SLP Short Term Goal 4 (Week 1): Patient will utilize speech intelligibility strategies at the conversational level with supervision cues.   SLP Short Term Goal 5 (Week 1): Patient will answer questions with no more than 2 verbal cues to stay on topic.   SLP Short Term Goal 5 - Progress (Week 1): Progressing toward goal  Skilled Therapeutic Interventions: Co-treatment session with OT; SLP facilitated session with initial education regarding safe swallow compensatory strategies.  Patient was mod I for use fo strategies while consuming Dys.3 textures and thin liquids with no overt s/s of aspiration.  SLP recommends follow up x2 days for dysphagia to ensure carryover.     FIM:  FIM - Eating Eating Activity: 6: More than reasonable amount of time  Pain Pain Assessment Pain Assessment: No/denies pain  Therapy/Group: Group Therapy  Charlane Ferretti., CCC-SLP [phone]  Christopher [name] 04/26/2012, 1:08 PM

## 2012-04-26 NOTE — Progress Notes (Signed)
Speech Language Pathology Daily Session Note  Patient Details  Name: Christopher [name] MRN: [id number] Date of Birth: [birth year]  Today's Date: 04/26/2012 Time: 4098-1191 Time Calculation (min): 40 min  Short Term Goals: Week 1: SLP Short Term Goal 1 (Week 1): Patient will tolerate Dys. 2 textures and nectar thick liquids with supervision cues to recall safe swallowing strategies with no overt s/s of aspiration.   SLP Short Term Goal 2 (Week 1): Patient will problem solve complex functional tasks with min assist verbal cues.   SLP Short Term Goal 2 - Progress (Week 1): Progressing toward goal SLP Short Term Goal 3 (Week 1): Patient will identify at least two strategies to improve speech intelligibility with supervision cues.   SLP Short Term Goal 4 (Week 1): Patient will utilize speech intelligibility strategies at the conversational level with supervision cues.   SLP Short Term Goal 5 (Week 1): Patient will answer questions with no more than 2 verbal cues to stay on topic.   SLP Short Term Goal 5 - Progress (Week 1): Progressing toward goal  Skilled Therapeutic Interventions: Treatment session addressed cognitive and dysphagia goals.  SLP facilitated session wtih no cues to verbally recall back precautions and required min assist verbal and tactile cues to don brace sitting edge of bed.  Patient reports son is available intermittently to assist with donning and doffing back brace as well as to assist with medication management at home.  Patient consumed thin liquids via straw (4oz) with no overt s/s of aspiration.     FIM:  Comprehension Comprehension Mode: Auditory Comprehension: 6-Follows complex conversation/direction: With extra time/assistive device Expression Expression Mode: Verbal Expression: 5-Expresses complex 90% of the time/cues < 10% of the time Social Interaction Social Interaction: 6-Interacts appropriately with others with medication or extra time (anti-anxiety,  antidepressant). Problem Solving Problem Solving: 4-Solves basic 75 - 89% of the time/requires cueing 10 - 24% of the time Memory Memory: 5-Recognizes or recalls 90% of the time/requires cueing < 10% of the time FIM - Eating Eating Activity: 5: Set-up assist for open containers  Pain Pain Assessment Pain Assessment: No/denies pain Pain Score:   3  Therapy/Group: Individual Therapy  Charlane Ferretti., CCC-SLP [phone]  [name] 04/26/2012, 5:14 PM

## 2012-04-26 NOTE — Patient Care Conference (Signed)
Inpatient RehabilitationTeam Conference Note Date: 04/26/2012   Time: 11:15 AM    Patient Name: Christopher [name]      Medical Record Number: [id number]  Date of Birth: 03/31/1924 Sex: Male         Room/Bed: 4036/4036-01 Payor Info: Payor: MEDICARE  Plan: MEDICARE PART A AND B  Product Type: *No Product type*     Admitting Diagnosis: LT CVA  Admit Date/Time:  04/19/2012  4:48 PM Admission Comments: No comment available   Primary Diagnosis:  CVA (cerebral infarction) Principal Problem: CVA (cerebral infarction)  Patient Active Problem List   Diagnosis Date Noted  . CVA (cerebral infarction) 04/20/2012  . Second degree AV block 04/17/2012  . Right sided weakness 04/15/2012  . Dyspnea 04/08/2012  . Anemia 04/03/2012  . Compression fracture 04/02/2012  . Hypercalcemia 04/02/2012  . HIATAL HERNIA 10/27/2007  . HEMORRHOIDS, INTERNAL 05/01/2007  . DIVERTICULOSIS, COLON 05/01/2007  . HYPERLIPIDEMIA 01/06/2007  . HYPERTENSION 01/06/2007  . GERD 01/06/2007  . OSTEOPOROSIS 01/06/2007  . ROSACEA 01/05/2007  . PROSTATE SPECIFIC ANTIGEN, ELEVATED 01/05/2007    Expected Discharge Date: Expected Discharge Date: 05/03/12  Team Members Present: Physician: Dr. Claudette Laws Nurse Present: Laural Roes, RN PT Present: Edman Circle, PT OT Present: Bretta Bang, Verlene Mayer, OT SLP Present: Fae Pippin, SLP     Current Status/Progress Goal Weekly Team Focus  Medical   Dizziness, neck pain  Improved pain  Headache pain   Bowel/Bladder   continent bowel/bladder, lbm 9/16, uses urinal  remain continent  encourage use of toilet   Swallow/Nutrition/ Hydration   Dys.3 with thin; upgraded 04/25/12 wiht full supervision   least restrctive p.o. intake  carryover use of compensatory strategies   ADL's             Mobility   neck pain limiting participation; supervision bed mobility for back precautions, min assist transfers and gait x 80' with RW  modified independent overall except  supervision 4 steps  back precautions, mobiliyt, re-ed LLE   Communication   min assist  supervision   increase vocal intensity   Safety/Cognition/ Behavioral Observations  min assist  supervision with complex  increase recall of new complex information    Pain   complaints of neck and back pain, wears tlso brace oob, prn robaxin and oxy ir, voltaren gel to neck  3 or less 1-10 scale  monitor for increasing pain, effectivess of medications   Skin   no skin issues  no new breakdown  monitor skin and use protective skincare products, encourage turning       *See Interdisciplinary Assessment and Plan and progress notes for long and short-term goals  Barriers to Discharge: Needs 24 7 care    Possible Resolutions to Barriers:  upgrade status versus SNF    Discharge Planning/Teaching Needs:  Home with family to provide assist/intermittently      Team Discussion:  BP low-ortho checks, sleeping better.  Diet upgraded to Dys 3 thin.  Needs help with back brace.  Working with son on a plan for home assistance.  Pt's plan to go home  Revisions to Treatment Plan:  NOne   Continued Need for Acute Rehabilitation Level of Care: The patient requires daily medical management by a physician with specialized training in physical medicine and rehabilitation for the following conditions: Daily direction of a multidisciplinary physical rehabilitation program to ensure safe treatment while eliciting the highest outcome that is of practical value to the patient.: Yes Daily medical management of  patient stability for increased activity during participation in an intensive rehabilitation regime.: Yes Daily analysis of laboratory values and/or radiology reports with any subsequent need for medication adjustment of medical intervention for : Neurological problems  Jissell Trafton, Lemar Livings 04/27/2012, 2:13 PM

## 2012-04-26 NOTE — Progress Notes (Signed)
Informed Dr. Wynn Banker in person of hypotension and bradycardia this morning - see vitals flowsheet.  No orders at this time.  Patient resting comfortably.  Christopher [name] 6:55 AM 04/26/2012

## 2012-04-26 NOTE — Progress Notes (Signed)
Occupational Therapy Session Note  Patient Details  Name: SALATHIEL FERRARA MRN: [id number] Date of Birth: 1923/12/03  Today's Date: 04/26/2012 Time: 0905-1000 Time Calculation (min): 55 min  Short Term Goals: Week 1:  OT Short Term Goal 1 (Week 1): Pt will be able to don pants over feet with supervision. OT Short Term Goal 2 (Week 1): Pt will don socks with min assist. OT Short Term Goal 3 (Week 1): Pt will don shoes with min to mod assist. OT Short Term Goal 4 (Week 1): Pt will ambulated to toilet with RW with supervision. OT Short Term Goal 5 (Week 1): Pt will toilet with supervision.  Skilled Therapeutic Interventions/Progress Updates:    Pt able to utilize AE with min instructional cueing for LB bathing  And dressing during session.  Needs max assist to donn TLSO and tighten to fit in sitting.  Has elastic shoe buttons so did not need to tie his shoes.  Used a combination of the reacher and LH shoe horn to donn them.  Min steady assist for sit to stand and standing balance while pulling pants over hips and washing peri area.  Pt reporting feeling more confident each day and having less pain.  Therapy Documentation Precautions:  Precautions Precautions: Fall;Back Precaution Comments: Pt able to recall 1/3 precautions.  Reeducated multiple times throughout the treatment session with no increase in carryover of the precautions.  Required Braces or Orthoses: Spinal Brace Spinal Brace: Thoracolumbosacral orthotic;Applied in sitting position Spinal Brace Comments: donn/doff in sitting/standing.   Restrictions Weight Bearing Restrictions: No  Pain: Pain Assessment Pain Assessment: 0-10 Pain Score:   4 Pain Type: Acute pain Pain Location: Back Pain Orientation: Right Pain Descriptors: Aching Pain Frequency: Intermittent Pain Onset: Gradual Patients Stated Pain Goal: 2 Pain Intervention(s): Medication (See eMAR);Repositioned Multiple Pain Sites: No ADL: See FIM for current  functional status  Therapy/Group: Individual Therapy  Jorel Gravlin OTR/L 04/26/2012, 12:14 PM

## 2012-04-26 NOTE — Progress Notes (Addendum)
Physical Therapy Note  Patient Details  Name: Christopher [name] MRN: [id number] Date of Birth: August 01, 1924 Today's Date: 04/26/2012  0900-0955 (55 minutes) BP: 117/80 pulse 80 (resting) Pain: 7/10 generalized body aches/premedicated Focus of treatment: gait training on level with RW/ over obstacles/ up/down curb/ up-down 2 steps with one rail; retrieving obstacles from floor using reacher/RW. Treatment:  Gait - 150 feet RW SBA on level; stepping over obstacles on floor with RW close SBA with vcs to step closer to obstacle before stepping; up/down 2 steps with one rail SBA; stepping up/down 4 inch step for quad strengthening (concentric/eccentric) with RW support; reviewed BAT precautions - pt recalls 2/3 (not arching); using reacher to retrieve object from floor using RW support SBA with vcs to bend knees not back.  Emonii Wienke,JIM 04/26/2012, 7:53 AM

## 2012-04-26 NOTE — Progress Notes (Signed)
Occupational Therapy Session Note  Patient Details  Name: Christopher [name] MRN: [id number] Date of Birth: Sep 29, 1923  Today's Date: 04/26/2012 Time: 1130-1145 Time Calculation (min): 15 min  Skilled Therapeutic Interventions/Progress Updates: Patient participated in [employer] today to address his ability to safely feed himself.  Patient effectively utilized both hands to feed himself a regular consistency diet.  Patient's daughter was present for this session.  Patient stated he enjoyed the social aspect of the group, and managed eating and talking in a safe and effective manner.       Therapy Documentation Precautions:  Precautions Precautions: Fall;Back Precaution Comments: Pt able to recall 1/3 precautions.  Reeducated multiple times throughout the treatment session with no increase in carryover of the precautions.  Required Braces or Orthoses: Spinal Brace Spinal Brace: Thoracolumbosacral orthotic;Applied in sitting position Spinal Brace Comments: donn/doff in sitting/standing.   Restrictions Weight Bearing Restrictions: No    Pain: No report of pain during group  See FIM for current functional status  Therapy/Group: Group Therapy  Collier Salina 04/26/2012, 2:26 PM

## 2012-04-26 NOTE — Progress Notes (Signed)
Social Work Patient ID: Christopher [name], male   DOB: 1923-10-14, 78 y.o.   MRN: [id number] Met with pt and daughter to inform team conference goals-supervision/mod/i level and discharge 9/25. Daughter reports she is going back home and pt's son is working, but pt and son are working something out in [place] to getting pt's brace on and other issues.  Discussed having home health come out again.  He pref [facility]. He is glad diet moved up to more regular food.  Will get son to come in and go through family education prior to discharge.

## 2012-04-26 NOTE — Progress Notes (Signed)
Patient ID: Christopher [name], male   DOB: September 24, 1923, 78 y.o.   MRN: [id number]  Subjective/Complaints: Right facial pain improves. Tolerating gabapentin Some dizziness when standing or sitting Review of Systems  Respiratory: Positive for shortness of breath. Negative for cough, hemoptysis, sputum production and wheezing.        Nocturnal  All other systems reviewed and are negative.   Objective: Vital Signs: Blood pressure 90/54, pulse 55, temperature 97.7 F (36.5 C), temperature source Oral, resp. rate 18, height 5\' 11"  (1.803 m), weight 68.7 kg (151 lb 7.3 oz), SpO2 95.00%. Dg Cervical Spine 2-3 Views  04/24/2012  *RADIOLOGY REPORT*  Clinical Data: Right-sided neck pain, limited range of motion.  CERVICAL SPINE - 2-3 VIEW  Comparison: 04/16/2012 CT  Findings: Multilevel degenerative disc disease, most pronounced at C5-6.  No acute fracture or dislocation identified.  This examination does not include odontoid views, limiting evaluation of the craniocervical and C1-2 articulations.  However, their appearance was normal on recent CT.  There is atherosclerotic vascular calcifications.  IMPRESSION: Multilevel degenerative disc disease, most pronounced at C5-6.  No acute osseous finding.   Original Report Authenticated By: Waneta Martins, M.D.    Results for orders placed during the hospital encounter of 04/19/12 (from the past 72 hour(s))  GLUCOSE, CAPILLARY     Status: Abnormal   Collection Time   04/24/12  9:34 PM      Component Value Range Comment   Glucose-Capillary 177 (*) 70 - 99 mg/dL    Comment 1 Documented in Chart      Comment 2 Notify RN        HEENT: poor dentition Cardio: RRR Resp: CTA B/L GI: BS positive Extremity:  Pulses positive and No Edema Skin:   Intact Neuro: Alert/Oriented Musc/Skel:  Normal Motor 4/5 on R side, 5/5 on L side Sensory- intact light touch BUE and BLE Cerebellar no ataxia  Assessment/Plan: 1. Functional deficits secondary to L pontomedullary  infarct with balance d/o which require 3+ hours per day of interdisciplinary therapy in a comprehensive inpatient rehab setting. Physiatrist is providing close team supervision and 24 hour management of active medical problems listed below. Physiatrist and rehab team continue to assess barriers to discharge/monitor patient progress toward functional and medical goals. FIM: FIM - Bathing Bathing Steps Patient Completed: Chest;Right Arm;Left Arm;Abdomen;Front perineal area;Buttocks;Right upper leg;Left upper leg Bathing: 4: Steadying assist  FIM - Upper Body Dressing/Undressing Upper body dressing/undressing steps patient completed: Thread/unthread right sleeve of pullover shirt/dresss;Thread/unthread left sleeve of pullover shirt/dress;Put head through opening of pull over shirt/dress;Pull shirt over trunk Upper body dressing/undressing: 5: Set-up assist to: Apply TLSO, cervical collar FIM - Lower Body Dressing/Undressing Lower body dressing/undressing steps patient completed: Thread/unthread right underwear leg;Thread/unthread left underwear leg;Pull underwear up/down;Thread/unthread right pants leg;Thread/unthread left pants leg;Pull pants up/down;Don/Doff right shoe;Don/Doff left shoe (Utilized LH reacher, shoe horn & elastic laces) Lower body dressing/undressing: 4: Min-Patient completed 75 plus % of tasks  FIM - Toileting Toileting steps completed by patient: Performs perineal hygiene;Adjust clothing prior to toileting;Adjust clothing after toileting Toileting Assistive Devices: Grab bar or rail for support Toileting: 4: Steadying assist  FIM - Archivist Transfers: 0-Activity did not occur  FIM - Banker Devices: Bed rails;Arm rests Bed/Chair Transfer: 0: Activity did not occur  FIM - Locomotion: Wheelchair Distance: 300 Locomotion: Wheelchair: 1: Total Assistance/staff pushes wheelchair (Pt<25%) FIM - Locomotion:  Ambulation Locomotion: Ambulation Assistive Devices: Designer, industrial/product Ambulation/Gait Assistance: 4: Min assist Locomotion:  Ambulation: 2: Travels 50 - 149 ft with minimal assistance (Pt.>75%)  Comprehension Comprehension Mode: Auditory Comprehension: 6-Follows complex conversation/direction: With extra time/assistive device  Expression Expression Mode: Verbal Expression: 5-Expresses complex 90% of the time/cues < 10% of the time  Social Interaction Social Interaction: 6-Interacts appropriately with others with medication or extra time (anti-anxiety, antidepressant).  Problem Solving Problem Solving: 4-Solves basic 75 - 89% of the time/requires cueing 10 - 24% of the time  Memory Memory: 5-Recognizes or recalls 90% of the time/requires cueing < 10% of the time  Medical Problem List and Plan:  1. 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 DVT Prophylaxis/Anticoagulation: Subcutaneous Lovenox. Monitor platelet counts any signs of bleeding  3. Pain Management:Neck and facial combination of neurogenic and musculoskeletal, add gabapentin -add voltaren gel to right wrist, ? Mild OA  -back brace  4. Neuropsych: This patient is capable of making decisions on his/her own behalf.  5. Recent thoracic T11 compression fracture. Conservative care with a back brace when out of bed  6. Hypertension/second degree AV block/sinus bradycardia.  Followup per cardiology services with conservative care. Avoid AV nodal blocking agents. Monitor for any signs of orthostasis check Orthostatic BPsWill reduce hydrochlorothiazide to 12.5 mg 7. Dysphagia. Dysphagia 1 nectar liquid diet. Followup per speech therapy. Monitor for any signs of aspiration  8. GERD. Protonix  LOS (Days) 7 A FACE TO FACE EVALUATION WAS PERFORMED  KIRSTEINS,ANDREW E 04/26/2012, 7:55 AM

## 2012-04-27 ENCOUNTER — Inpatient Hospital Stay (HOSPITAL_COMMUNITY): Payer: Medicare Other | Admitting: Occupational Therapy

## 2012-04-27 ENCOUNTER — Inpatient Hospital Stay (HOSPITAL_COMMUNITY): Payer: Medicare Other

## 2012-04-27 ENCOUNTER — Inpatient Hospital Stay (HOSPITAL_COMMUNITY): Payer: Medicare Other | Admitting: Speech Pathology

## 2012-04-27 DIAGNOSIS — Z5189 Encounter for other specified aftercare: Secondary | ICD-10-CM

## 2012-04-27 DIAGNOSIS — I6330 Cerebral infarction due to thrombosis of unspecified cerebral artery: Secondary | ICD-10-CM

## 2012-04-27 DIAGNOSIS — I69993 Ataxia following unspecified cerebrovascular disease: Secondary | ICD-10-CM

## 2012-04-27 MED ORDER — ENSURE COMPLETE PO LIQD
237.0000 mL | ORAL | Status: DC
  Administered 2012-04-27 – 2012-05-01 (×5): 237 mL via ORAL

## 2012-04-27 MED ORDER — LISINOPRIL 10 MG PO TABS
10.0000 mg | ORAL_TABLET | Freq: Every day | ORAL | Status: DC
  Administered 2012-04-28 – 2012-05-02 (×5): 10 mg via ORAL
  Filled 2012-04-27 (×6): qty 1

## 2012-04-27 MED ORDER — ADULT MULTIVITAMIN W/MINERALS CH
1.0000 | ORAL_TABLET | Freq: Every day | ORAL | Status: DC
  Administered 2012-04-27 – 2012-05-03 (×7): 1 via ORAL
  Filled 2012-04-27 (×8): qty 1

## 2012-04-27 NOTE — Progress Notes (Signed)
Patient complained of shortness of breath at this time and stated he "couldn't hardly breathe".  Vital stable - see vital flowsheet.  After re-assessment, patient stated he was having a bad dream and his mouth was dry.  Drank 240 ml of water and stated he "feels much better".  Denies chest pain, lung clear to auscultation, no cough present.  Will continue to monitor.  Barrie Lyme 1:42 AM 04/27/2012

## 2012-04-27 NOTE — Progress Notes (Addendum)
Physical Therapy Weekly Progress Note  Patient Details  Name: Christopher [name] MRN: [id number] Date of Birth: 1924/08/09  Today's Date: 04/27/2012 Time:1305-1400 Time Calculation (min): 55 min  Patient has met 5 of 5 short term goals.  He is not consistently able to verbally state back precautions, but does demonstrate them in pertinent situations, with 0-1 cue.  Patient continues to demonstrate the following deficits: strength, balance, awareness, activity tolerance,  and therefore will continue to benefit from skilled PT intervention to enhance overall performance with activity tolerance, balance, postural control, ability to compensate for deficits, functional use of  right upper extremity and right lower extremity and awareness.  Patient progressing toward long term goals..  Continue plan of care. Pt will have intermittent supervision by son.  PT Short Term Goals PT Short Term Goal 4 - Progress (Week 1): Met PT Short Term Goal 5 (Week 1): pt will demonstrate 3/3 back precautions during functional activities, with 0-1 cues PT Short Term Goal 5 - Progress (Week 1): Met  Skilled Therapeutic Interventions/Progress Updates:   L lower strap of TLSO detached due to screw coming unscrewed.  Reattached and notified RN.  Gait with RW x 90' with close supervision, cues for upright posture, forward gaze, wider stance.  Neuromuscular re-education in standing with mod assist for balance, mini-squats, sidestepping L<>R, backwards walking.  SIt>< stand from chair by elevator x 3, focusing on LE power.  Gait x 150' with close supervision, cues as above.  Gait up/down 5 steps, 2 rails, with close supervision, alternating feet; R knee buckled slightly x 1; pt recovered balance.  Bil hip abduction in sitting using lt green [employer] for resistance.  Standing balance activity x 4 minutes, reaching very slightly out of BOS to L or R with same hand, to retrieve items, with 1 UE support on table.  Pt SOB  after 4 minutes 3/4.  Pt unable to state back precautions verbally, but able to problem solve in situations to avoid precautions; 0-1 VC.     Therapy Documentation Precautions:  Precautions Precautions: Fall;Back Required Braces or Orthoses: Spinal Brace Spinal Brace: Thoracolumbosacral orthotic;Applied in sitting position Spinal Brace Comments: donn/doff in sitting/standing.   Restrictions Weight Bearing Restrictions: No Pain: Pain Assessment Pain Score:   5 Pain Location: Neck Pain Intervention(s): Medication (See eMAR)         See FIM for current functional status  Therapy/Group: Individual Therapy  Galit Urich 04/27/2012, 4:13 PM

## 2012-04-27 NOTE — Progress Notes (Signed)
Occupational Therapy Session Note  Patient Details  Name: Christopher [name] MRN: [id number] Date of Birth: May 21, 1924  Today's Date: 04/27/2012 Time: 0830-0929 Time Calculation (min): 59 min  Short Term Goals: Week 1:  OT Short Term Goal 1 (Week 1): Pt will be able to don pants over feet with supervision. OT Short Term Goal 2 (Week 1): Pt will don socks with min assist. OT Short Term Goal 3 (Week 1): Pt will don shoes with min to mod assist. OT Short Term Goal 4 (Week 1): Pt will ambulated to toilet with RW with supervision. OT Short Term Goal 5 (Week 1): Pt will toilet with supervision.  Skilled Therapeutic Interventions/Progress Updates:    Session 1) Pt seen this am for individual OT treatment session with focus on eating w/ vc/supervision only; bathing and dressing at shower level. Pt was overall supervision/vc assist into shower and steady assist out of shower. Pt was supervision/vc's for back precautions and overall min A-set-up LB dressing w/ long handled A/E (pt would benefit from long handled sponge for lower body bathing). Pt continues to be Max assist to don TLSO and will need assist with this at discharge.   11:30-12 pm (30 Min) Session 2) Pt was seen in group treatment session for diners club today. He is Mod I for self feeding, tray set up and use of bilateral UE's noted.   Therapy Documentation Precautions:  Precautions Precautions: Fall;Back Precaution Comments: Pt able to recall 1/3 precautions.  Reeducated multiple times throughout the treatment session with no increase in carryover of the precautions.  Required Braces or Orthoses: Spinal Brace Spinal Brace: Thoracolumbosacral orthotic;Applied in sitting position Spinal Brace Comments: donn/doff in sitting/standing.   Restrictions Weight Bearing Restrictions: No     Pain: Pain Assessment Pain Assessment: No/denies pain Pain Score: 0-No pain       See FIM for current functional status  Therapy/Group: Individual  Therapy  Alm Bustard 04/27/2012, 12:58 PM

## 2012-04-27 NOTE — Progress Notes (Signed)
Speech Language Pathology Daily Session Note  Patient Details  Name: Christopher [name] MRN: [id number] Date of Birth: [birth year]  Today's Date: 04/27/2012 Time: 4098-1191 Time Calculation (min): 25 min  Short Term Goals: Week 1: SLP Short Term Goal 1 (Week 1): Patient will tolerate Dys. 2 textures and nectar thick liquids with supervision cues to recall safe swallowing strategies with no overt s/s of aspiration.   SLP Short Term Goal 2 (Week 1): Patient will problem solve complex functional tasks with min assist verbal cues.   SLP Short Term Goal 2 - Progress (Week 1): Progressing toward goal SLP Short Term Goal 3 (Week 1): Patient will identify at least two strategies to improve speech intelligibility with supervision cues.   SLP Short Term Goal 4 (Week 1): Patient will utilize speech intelligibility strategies at the conversational level with supervision cues.   SLP Short Term Goal 5 (Week 1): Patient will answer questions with no more than 2 verbal cues to stay on topic.   SLP Short Term Goal 5 - Progress (Week 1): Progressing toward goal  Skilled Therapeutic Interventions: Co-treatment session with OT;  SLP facilitated session with intermittent verbal cues to consume small bites.  Patient demonstrated no overt s/s of aspiration throughout meal.  Recommend discharge from [employer].  FIM:  Comprehension Comprehension Mode: Auditory Comprehension: 6-Follows complex conversation/direction: With extra time/assistive device Expression Expression: 5-Expresses complex 90% of the time/cues < 10% of the time Social Interaction Social Interaction: 6-Interacts appropriately with others with medication or extra time (anti-anxiety, antidepressant). Problem Solving Problem Solving: 4-Solves basic 75 - 89% of the time/requires cueing 10 - 24% of the time Memory Memory: 5-Recognizes or recalls 90% of the time/requires cueing < 10% of the time FIM - Eating Eating Activity: 5:  Supervision/cues  Pain Pain Assessment Pain Assessment: No/denies pain  Therapy/Group: Group Therapy  Charlane Ferretti., CCC-SLP [phone]  Susane Bey 04/27/2012, 4:31 PM

## 2012-04-27 NOTE — Progress Notes (Signed)
Speech Language Pathology Daily Session Note and Weekly Progress Note  Patient Details  Name: Christopher [name] MRN: [id number] Date of Birth: 1924/01/14  Today's Date: 04/27/2012 Time: 1020-1105 Time Calculation (min): 45 min  Short Term Goals: Week 1: SLP Short Term Goal 1 (Week 1): Patient will tolerate Dys. 2 textures and nectar thick liquids with supervision cues to recall safe swallowing strategies with no overt s/s of aspiration.   SLP Short Term Goal 2 (Week 1): Patient will problem solve complex functional tasks with min assist verbal cues.   SLP Short Term Goal 2 - Progress (Week 1): Progressing toward goal SLP Short Term Goal 3 (Week 1): Patient will identify at least two strategies to improve speech intelligibility with supervision cues.   SLP Short Term Goal 4 (Week 1): Patient will utilize speech intelligibility strategies at the conversational level with supervision cues.   SLP Short Term Goal 5 (Week 1): Patient will answer questions with no more than 2 verbal cues to stay on topic.   SLP Short Term Goal 5 - Progress (Week 1): Progressing toward goal  Skilled Therapeutic Interventions: Treatment session addressed dysphagia goals.  Patient tolerated trials of medications in thin liquids and puree with supervision and exhibited no overt s/s of aspiration.  Patient exhibited adequate oral control of pill; however, larger uncoated pills were better tolerated with puree than thin liquids.  Patient problem solved how to best orally manage medications with supervision cues.     FIM:  Comprehension Comprehension Mode: Auditory Comprehension: 6-Follows complex conversation/direction: With extra time/assistive device Expression Expression: 5-Expresses complex 90% of the time/cues < 10% of the time Social Interaction Social Interaction: 6-Interacts appropriately with others with medication or extra time (anti-anxiety, antidepressant). Problem Solving Problem Solving: 4-Solves basic 75 -  89% of the time/requires cueing 10 - 24% of the time Memory Memory: 5-Recognizes or recalls 90% of the time/requires cueing < 10% of the time FIM - Eating Eating Activity: 5: Supervision/cues  Pain Pain Assessment Pain Assessment: No/denies pain  Therapy/Group: Individual Therapy  PageJoni Reining 04/27/2012, 4:41 PM   Speech Language Pathology Weekly Progress Note  Patient Details  Name: Christopher [name] MRN: [id number] Date of Birth: [birth year]  Today's Date: 04/27/2012  Short Term Goals: Week 1: SLP Short Term Goal 1 (Week 1): Patient will tolerate Dys. 2 textures and nectar thick liquids with supervision cues to recall safe swallowing strategies with no overt s/s of aspiration.   SLP Short Term Goal 1 - Progress (Week 1): Met SLP Short Term Goal 2 (Week 1): Patient will problem solve complex functional tasks with min assist verbal cues.   SLP Short Term Goal 2 - Progress (Week 1): Met SLP Short Term Goal 3 (Week 1): Patient will identify at least two strategies to improve speech intelligibility with supervision cues.   SLP Short Term Goal 3 - Progress (Week 1): Progressing toward goal SLP Short Term Goal 4 (Week 1): Patient will utilize speech intelligibility strategies at the conversational level with supervision cues.   SLP Short Term Goal 4 - Progress (Week 1): Progressing toward goal SLP Short Term Goal 5 (Week 1): Patient will answer questions with no more than 2 verbal cues to stay on topic.   SLP Short Term Goal 5 - Progress (Week 1): Progressing toward goal Week 2: SLP Short Term Goal 1 (Week 2): Patient will tolerate Dys. 3 textures and thin liquids with supervision cues to recall safe swallowing strategies with no overt s/s of aspiration. SLP  Short Term Goal 2 (Week 2): Patient will utilize speech intelligibility strategies at the conversational level with supervision cues.  SLP Short Term Goal 3 (Week 2): Patient will answer questions with no more than 2 verbal cues to stay  on topic.  SLP Short Term Goal 4 (Week 2): Patient will identify at least two strategies to improve speech intelligibility with supervision cues. SLP Short Term Goal 5 (Week 2): Patient will problem solve complex functional tasks with supervision cues. SLP Short Term Goal 6 (Week 2): Patient will tolerate trials of regular textures with supervision cues to recall safe swallowing strategies with no overt s/s of aspiration.  Weekly Progress Updates: Patient met 2 out of 5 short term goals this reporting period with gains in dysphagia management and complex problem solving.  Patient's diet was upgraded from Dys. 2, nectar thick liquids to Dys. 3, thin liquids.  Patient utilizes compensatory swallowing strategies with supervision cues to modified independence during self-feeding tasks.  Patient also problem solved complex functional tasks with supervision cues.  Patient also made functional progress towards speech intelligibility despite not meeting his goals.  He identified and utilized two speech intelligibility strategies (e.g. slow rate, overarticulation) with min assist verbal cues.    Patient continues to demonstrate decreased topic maintenance and needs frequent cuing to stay on topic.  Patient continues to be a good candidate for receiving speech services in order to maximize functional independence and reduce burden of care upon discharge.     SLP Frequency: 1-2 X/day, 30-60 minutes;5 out of 7 days SLP Treatment/Interventions: Internal/external aids;Cognitive remediation/compensation;Environmental controls;Functional tasks;Therapeutic Activities;Patient/family education;Dysphagia/aspiration precaution training;Cueing hierarchy  Jackalyn Lombard, Conrad Finley  Graduate Clinician Speech Language Pathology  Page, Joni Reining 04/27/2012, 5:17 PM  The above skilled treatment note and weekly progress update has been reviewed and SLP is in agreement. Fae Pippin, M.A., CCC-SLP [phone]

## 2012-04-27 NOTE — Progress Notes (Signed)
INITIAL ADULT NUTRITION ASSESSMENT Date: 04/27/2012   Time: 10:42 AM  Reason for Assessment: Malnutrition Screening  INTERVENTION: 1. Ensure Complete po daily, each supplement provides 350 kcal and 13 grams of protein. 2. MVI daily 3. RD to continue to follow nutrition care plan  DOCUMENTATION CODES Per approved criteria  -Not Applicable   ASSESSMENT: Male 78 y.o.  Dx: CVA (cerebral infarction)  Hx:  Past Medical History  Diagnosis Date  . GERD (gastroesophageal reflux disease) 08/1996  . Hyperlipemia 08/1986  . Hypertension Pre 1996  . Osteoporosis 12/2004  . Pneumonia 22 yoa  . History of Doppler echocardiogram 05/1995    MVP, MOD AI, MILD TR, MILD MR  . Elevated PSA     decided against further eval  . Diverticulosis   . Bilateral sensorineural hearing loss 02/2012    some conductive left side  . Compression fracture     multiple  . AV block, 2nd degree 04/2012   Past Surgical History  Procedure Date  . Cholecystectomy 05/1995  . Exercise treadmill 09/12/2006    NML  . Inguinal hernia repair 08/14/2010    Dr. Daphine Deutscher  . Esophagogastroduodenoscopy 08/18/2004    [facility]. Dr. Russella Dar  . Colonoscopy 05/01/2007    divericulosis, int hemorrhoids - Dr. Russella Dar   Related Meds:     . aspirin EC  81 mg Oral Daily  . clopidogrel  75 mg Oral Q breakfast  . diclofenac sodium  1 application Topical QID  . enoxaparin  40 mg Subcutaneous Q24H  . gabapentin  300 mg Oral TID  . hydrochlorothiazide  12.5 mg Oral Daily  . lisinopril  10 mg Oral Daily  . loratadine  10 mg Oral QPM  . nystatin  5 mL Oral QID  . pantoprazole  40 mg Oral Daily  . potassium chloride  20 mEq Oral Daily  . DISCONTD: antiseptic oral rinse  15 mL Mouth Rinse q12n4p  . DISCONTD: lisinopril  20 mg Oral Daily   Ht: 5\' 11"  (180.3 cm)  Wt: 151 lb 7.3 oz (68.7 kg)  Ideal Wt: 78.2 kg % Ideal Wt: 88%  Wt Readings from Last 15 Encounters:  04/19/12 151 lb 7.3 oz (68.7 kg)  04/18/12 182 lb (82.555 kg)    04/14/12 165 lb 4 oz (74.957 kg)  04/03/12 169 lb 8 oz (76.885 kg)  02/15/12 164 lb 12 oz (74.73 kg)  01/06/12 166 lb 8 oz (75.524 kg)  01/04/12 168 lb 4 oz (76.318 kg)  08/18/11 167 lb 4 oz (75.864 kg)  02/11/11 162 lb (73.483 kg)  07/16/10 170 lb 8 oz (77.338 kg)  07/08/10 169 lb 4 oz (76.771 kg)  05/14/10 176 lb (79.833 kg)  09/04/09 179 lb (81.194 kg)  02/17/09 169 lb (76.658 kg)  08/12/08 171 lb (77.565 kg)  Usual Wt: 160 - 168 lb % Usual Wt: 92%  Body mass index is 21.12 kg/(m^2). Weight is WNL.  Food/Nutrition Related Hx: poor intake PTA 2/2 "too busy to eat"  Labs:  CMP     Component Value Date/Time   NA 142 04/20/2012 0510   K 3.7 04/20/2012 0510   CL 106 04/20/2012 0510   CO2 26 04/20/2012 0510   GLUCOSE 112* 04/20/2012 0510   BUN 27* 04/20/2012 0510   CREATININE 1.04 04/20/2012 0510   CALCIUM 9.5 04/20/2012 0510   PROT 7.1 04/20/2012 0510   ALBUMIN 3.0* 04/20/2012 0510   AST 18 04/20/2012 0510   ALT 25 04/20/2012 0510   ALKPHOS 65  04/20/2012 0510   BILITOT 0.8 04/20/2012 0510   GFRNONAA 63* 04/20/2012 0510   GFRAA 73* 04/20/2012 0510   CBG (last 3)   Basename 04/24/12 2134  GLUCAP 177*   Lab Results  Component Value Date   HGBA1C 5.8* 04/16/2012    Intake/Output Summary (Last 24 hours) at 04/27/12 1044 Last data filed at 04/27/12 0549  Gross per 24 hour  Intake    480 ml  Output    200 ml  Net    280 ml  BM 9/16  Diet Order: Dysphagia 3 with thin liquids  Supplements/Tube Feeding: none  IVF:    Estimated Nutritional Needs:   Kcal: 1700 - 1900 kcal Protein:  85 - 95 grams protein Fluid: > 2 liters daily  Pt seen by RD during acute hospitalization. Noted pt stated that he had lost about 5 lbs in the past 2 weeks PTA 2/2 being too busy to eat. Pt stating that he has a good appetite now. Most recently, pt eating 75 - 100% of meals.  Pt upgraded to current diet on 9/17, now on Dysphagia 3 with thickened liquids (noted that he was previously on Dysphagia 1  with Nectar Thickened Liquids.) Pt states that he is eating well and is doing much better on his upgraded diet. Discussed his ongoing weight loss and need for extra calories and protein with ongoing therapy. Pt agreeable to trying Ensure Complete daily.  No skin breakdown noted. Pt is at nutrition risk given ongoing weight loss and advanced age.  NUTRITION DIAGNOSIS: Increased nutrient needs r/t participation in therapies AEB estimated energy needs.  MONITORING/EVALUATION(Goals): Goal: Pt to meet >/= 90% of their estimated nutrition needs Monitor: weight trends, lab trends, I/O's, PO intake, supplement tolerance  EDUCATION NEEDS: -Education needs addressed  Jarold Motto MS, RD, LDN Pager: [phone] After-hours pager: [phone]

## 2012-04-27 NOTE — Progress Notes (Signed)
Patient ID: Christopher [name], male   DOB: November 01, 1923, 78 y.o.   MRN: [id number]  Subjective/Complaints: Right facial pain improves. Tolerating gabapentin Orthostatics not complete Review of Systems  Respiratory: Positive for shortness of breath. Negative for cough, hemoptysis, sputum production and wheezing.        Nocturnal  All other systems reviewed and are negative.   Objective: Vital Signs: Blood pressure 104/69, pulse 70, temperature 97.6 F (36.4 C), temperature source Oral, resp. rate 17, height 5\' 11"  (1.803 m), weight 68.7 kg (151 lb 7.3 oz), SpO2 94.00%. No results found. Results for orders placed during the hospital encounter of 04/19/12 (from the past 72 hour(s))  GLUCOSE, CAPILLARY     Status: Abnormal   Collection Time   04/24/12  9:34 PM      Component Value Range Comment   Glucose-Capillary 177 (*) 70 - 99 mg/dL    Comment 1 Documented in Chart      Comment 2 Notify RN        HEENT: poor dentition Cardio: RRR Resp: CTA B/L GI: BS positive Extremity:  Pulses positive and No Edema Skin:   Intact Neuro: Alert/Oriented Musc/Skel:  Normal Motor 4/5 on R side, 5/5 on L side Sensory- intact light touch BUE and BLE Cerebellar no ataxia  Assessment/Plan: 1. Functional deficits secondary to L pontomedullary infarct with balance d/o which require 3+ hours per day of interdisciplinary therapy in a comprehensive inpatient rehab setting. Physiatrist is providing close team supervision and 24 hour management of active medical problems listed below. Physiatrist and rehab team continue to assess barriers to discharge/monitor patient progress toward functional and medical goals. FIM: FIM - Bathing Bathing Steps Patient Completed: Chest;Right Arm;Left Arm;Abdomen;Front perineal area;Buttocks;Right upper leg;Left upper leg;Right lower leg (including foot);Left lower leg (including foot) Bathing: 4: Steadying assist  FIM - Upper Body Dressing/Undressing Upper body  dressing/undressing steps patient completed: Thread/unthread right sleeve of pullover shirt/dresss;Thread/unthread left sleeve of pullover shirt/dress;Put head through opening of pull over shirt/dress;Pull shirt over trunk Upper body dressing/undressing: 5: Set-up assist to: Obtain clothing/put away FIM - Lower Body Dressing/Undressing Lower body dressing/undressing steps patient completed: Thread/unthread left pants leg;Pull pants up/down;Thread/unthread right underwear leg;Thread/unthread left underwear leg;Pull underwear up/down;Don/Doff right sock;Thread/unthread right pants leg;Don/Doff left sock;Don/Doff right shoe;Don/Doff left shoe Lower body dressing/undressing: 4: Steadying Assist  FIM - Toileting Toileting steps completed by patient: Performs perineal hygiene;Adjust clothing prior to toileting;Adjust clothing after toileting Toileting Assistive Devices: Grab bar or rail for support Toileting: 4: Steadying assist  FIM - Archivist Transfers: 0-Activity did not occur  FIM - Banker Devices: Bed rails;Arm rests Bed/Chair Transfer: 4: Supine > Sit: Min A (steadying Pt. > 75%/lift 1 leg);4: Sit > Supine: Min A (steadying pt. > 75%/lift 1 leg);3: Bed > Chair or W/C: Mod A (lift or lower assist);3: Chair or W/C > Bed: Mod A (lift or lower assist)  FIM - Locomotion: Wheelchair Distance: 300 Locomotion: Wheelchair: 1: Total Assistance/staff pushes wheelchair (Pt<25%) FIM - Locomotion: Ambulation Locomotion: Ambulation Assistive Devices: Designer, industrial/product Ambulation/Gait Assistance: 4: Min assist Locomotion: Ambulation: 2: Travels 50 - 149 ft with minimal assistance (Pt.>75%)  Comprehension Comprehension Mode: Auditory Comprehension: 6-Follows complex conversation/direction: With extra time/assistive device  Expression Expression Mode: Verbal Expression: 5-Expresses complex 90% of the time/cues < 10% of the time  Social  Interaction Social Interaction: 6-Interacts appropriately with others with medication or extra time (anti-anxiety, antidepressant).  Problem Solving Problem Solving: 4-Solves basic 75 - 89% of  the time/requires cueing 10 - 24% of the time  Memory Memory: 5-Recognizes or recalls 90% of the time/requires cueing < 10% of the time  Medical Problem List and Plan:  1. 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 DVT Prophylaxis/Anticoagulation: Subcutaneous Lovenox. Monitor platelet counts any signs of bleeding  3. Pain Management:Neck and facial combination of neurogenic and musculoskeletal, add gabapentin -add voltaren gel to right wrist, ? Mild OA  -back brace  4. Neuropsych: This patient is capable of making decisions on his/her own behalf.  5. Recent thoracic T11 compression fracture. Conservative care with a back brace when out of bed  6. Hypertension/second degree AV block/sinus bradycardia.  Followup per cardiology services with conservative care. Avoid AV nodal blocking agents. Monitor for any signs of orthostasis check Orthostatic BPsWill reduce hydrochlorothiazide to 12.5 mg and prinivil to 10mg  7. Dysphagia. Dysphagia 1 nectar liquid diet. Followup per speech therapy. Monitor for any signs of aspiration  8. GERD. Protonix  LOS (Days) 8 A FACE TO FACE EVALUATION WAS PERFORMED  KIRSTEINS,ANDREW E 04/27/2012, 8:16 AM

## 2012-04-28 ENCOUNTER — Inpatient Hospital Stay (HOSPITAL_COMMUNITY): Payer: Medicare Other | Admitting: Speech Pathology

## 2012-04-28 ENCOUNTER — Inpatient Hospital Stay (HOSPITAL_COMMUNITY): Payer: Medicare Other | Admitting: Physical Therapy

## 2012-04-28 ENCOUNTER — Inpatient Hospital Stay (HOSPITAL_COMMUNITY): Payer: Medicare Other | Admitting: Occupational Therapy

## 2012-04-28 ENCOUNTER — Encounter (HOSPITAL_COMMUNITY): Payer: Medicare Other | Admitting: Occupational Therapy

## 2012-04-28 NOTE — Progress Notes (Signed)
Physical Therapy Note  Patient Details  Name: DAQUANE AGUILAR MRN: [id number] Date of Birth: 02-13-1924 Today's Date: 04/28/2012  1300-1355 (55 minutes) individual Pain: 7/10 neck/premedicated Focus of treatment: gait training focused on heel toe gait with increased step length and decreased forward lean on AD Treatment: Gait 80 feet RW min to close SBA with vcing to decrease flat foot gait pattern and decrease forward lean on AD X 2 ; up/down 4 inch step X 10 with/without RW support for quad strengthening/balance; standing tolerance without AD X 5 minutes performing pipe tree with minimal cues.   Violia Knopf,JIM 04/28/2012, 8:09 AM

## 2012-04-28 NOTE — Progress Notes (Signed)
Patient ID: Christopher [name], male   DOB: 1924-03-21, 78 y.o.   MRN: [id number]  Subjective/Complaints: Right facial pain improved. Tolerating gabapentin Still with R side neck pain Review of Systems  Respiratory: Positive for shortness of breath. Negative for cough, hemoptysis, sputum production and wheezing.        Nocturnal  All other systems reviewed and are negative.   Objective: Vital Signs: Blood pressure 105/71, pulse 86, temperature 97.7 F (36.5 C), temperature source Oral, resp. rate 17, height 5\' 11"  (1.803 m), weight 68.7 kg (151 lb 7.3 oz), SpO2 86.00%. No results found. No results found for this or any previous visit (from the past 72 hour(s)).   HEENT: poor dentition Cardio: RRR Resp: CTA B/L GI: BS positive Extremity:  Pulses positive and No Edema Skin:   Intact Neuro: Alert/Oriented Musc/Skel:  Normal except R upper trap trigger point Motor 4/5 on R side, 5/5 on L side Sensory- intact light touch BUE and BLE Cerebellar no ataxia  Assessment/Plan: 1. Functional deficits secondary to L pontomedullary infarct with balance d/o which require 3+ hours per day of interdisciplinary therapy in a comprehensive inpatient rehab setting. Physiatrist is providing close team supervision and 24 hour management of active medical problems listed below. Physiatrist and rehab team continue to assess barriers to discharge/monitor patient progress toward functional and medical goals. FIM: FIM - Bathing Bathing Steps Patient Completed: Chest;Right Arm;Left Arm;Abdomen;Front perineal area;Buttocks;Right upper leg;Left upper leg;Right lower leg (including foot);Left lower leg (including foot) Bathing: 4: Steadying assist  FIM - Upper Body Dressing/Undressing Upper body dressing/undressing steps patient completed: Thread/unthread right sleeve of pullover shirt/dresss;Thread/unthread left sleeve of pullover shirt/dress;Put head through opening of pull over shirt/dress;Pull shirt over  trunk Upper body dressing/undressing: 5: Set-up assist to: Obtain clothing/put away FIM - Lower Body Dressing/Undressing Lower body dressing/undressing steps patient completed: Thread/unthread right pants leg;Thread/unthread left pants leg;Pull pants up/down;Don/Doff right shoe;Don/Doff left shoe Lower body dressing/undressing: 4: Steadying Assist  FIM - Toileting Toileting steps completed by patient: Performs perineal hygiene;Adjust clothing prior to toileting;Adjust clothing after toileting Toileting Assistive Devices: Grab bar or rail for support Toileting: 4: Steadying assist  FIM - Archivist Transfers: 0-Activity did not occur  FIM - Banker Devices: Walker;Orthosis (TLSO) Bed/Chair Transfer: 5: Bed > Chair or W/C: Supervision (verbal cues/safety issues);5: Chair or W/C > Bed: Supervision (verbal cues/safety issues)  FIM - Locomotion: Wheelchair Distance: 300 Locomotion: Wheelchair: 1: Total Assistance/staff pushes wheelchair (Pt<25%) FIM - Locomotion: Ambulation Locomotion: Ambulation Assistive Devices: Walker - Rolling;Orthosis Ambulation/Gait Assistance: 4: Min assist Locomotion: Ambulation: 5: Travels 150 ft or more with supervision/safety issues  Comprehension Comprehension Mode: Auditory Comprehension: 6-Follows complex conversation/direction: With extra time/assistive device  Expression Expression Mode: Verbal Expression: 5-Expresses complex 90% of the time/cues < 10% of the time  Social Interaction Social Interaction: 6-Interacts appropriately with others with medication or extra time (anti-anxiety, antidepressant).  Problem Solving Problem Solving: 4-Solves basic 75 - 89% of the time/requires cueing 10 - 24% of the time  Memory Memory: 5-Recognizes or recalls 90% of the time/requires cueing < 10% of the time  Medical Problem List and Plan:  1. 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 DVT Prophylaxis/Anticoagulation: Subcutaneous Lovenox. Monitor platelet counts any signs of bleeding  3. Pain Management:Neck and facial combination of neurogenic and musculoskeletal, add gabapentin -add voltaren gel to right wrist, ? Mild OA  -back  brace  4. Neuropsych: This patient is capable of making decisions on his/her own behalf.  5. Recent thoracic T11 compression fracture. Conservative care with a back brace when out of bed  6. Hypertension/second degree AV block/sinus bradycardia.  Followup per cardiology services with conservative care. Avoid AV nodal blocking agents. Monitor for any signs of orthostasis check Orthostatic BPsWill reduce hydrochlorothiazide to 12.5 mg and prinivil to 10mg  7. Dysphagia. Dysphagia 1 nectar liquid diet. Followup per speech therapy. Monitor for any signs of aspiration  8. GERD. Protonix  LOS (Days) 9 A FACE TO FACE EVALUATION WAS PERFORMED  KIRSTEINS,ANDREW E 04/28/2012, 9:25 AM

## 2012-04-28 NOTE — Progress Notes (Signed)
Speech Language Pathology Daily Session Note  Patient Details  Name: Christopher [name] MRN: [id number] Date of Birth: 1923-11-03  Today's Date: 04/28/2012 Time: 1010-1040 Time Calculation (min): 30 min  Short Term Goals: Week 2: SLP Short Term Goal 1 (Week 2): Patient will tolerate Dys. 3 textures and thin liquids with supervision cues to recall safe swallowing strategies with no overt s/s of aspiration. SLP Short Term Goal 2 (Week 2): Patient will utilize speech intelligibility strategies at the conversational level with supervision cues.  SLP Short Term Goal 3 (Week 2): Patient will answer questions with no more than 2 verbal cues to stay on topic.  SLP Short Term Goal 4 (Week 2): Patient will identify at least two strategies to improve speech intelligibility with supervision cues. SLP Short Term Goal 5 (Week 2): Patient will problem solve complex functional tasks with supervision cues. SLP Short Term Goal 6 (Week 2): Patient will tolerate trials of regular textures with supervision cues to recall safe swallowing strategies with no overt s/s of aspiration.  Skilled Therapeutic Interventions: Treatment session focused on addressing family education regarding dysphagia and cognition.  SLP facilitated session with supervision level verbal cues to recall information pertaining to transfers and sequence/procedures for when to don back brace.  Patient was mod I to recall and state dysphagia recommendations and SLP confirmed information for daughter.  Education still needs to be completed with son who will be primary caregiver upon discharge.    FIM:  Comprehension Comprehension Mode: Auditory Comprehension: 6-Follows complex conversation/direction: With extra time/assistive device Expression Expression: 5-Expresses complex 90% of the time/cues < 10% of the time Social Interaction Social Interaction: 6-Interacts appropriately with others with medication or extra time (anti-anxiety,  antidepressant). Problem Solving Problem Solving: 4-Solves basic 75 - 89% of the time/requires cueing 10 - 24% of the time Memory Memory: 5-Recognizes or recalls 90% of the time/requires cueing < 10% of the time FIM - Eating Eating Activity: 5: Supervision/cues  Pain Pain Assessment Pain Assessment: No/denies pain  Therapy/Group: Individual Therapy  Charlane Ferretti., CCC-SLP [phone]  Christopher [name] 04/28/2012, 11:21 AM

## 2012-04-28 NOTE — Progress Notes (Signed)
Speech Language Pathology Daily Session Note  Patient Details  Name: Christopher [name] MRN: [id number] Date of Birth: 02/06/1924  Today's Date: 04/28/2012 Time: 1300-1355 Time Calculation (min): 55 min  Short Term Goals: Week 2: SLP Short Term Goal 1 (Week 2): Patient will tolerate Dys. 3 textures and thin liquids with supervision cues to recall safe swallowing strategies with no overt s/s of aspiration. SLP Short Term Goal 2 (Week 2): Patient will utilize speech intelligibility strategies at the conversational level with supervision cues.  SLP Short Term Goal 3 (Week 2): Patient will answer questions with no more than 2 verbal cues to stay on topic.  SLP Short Term Goal 4 (Week 2): Patient will identify at least two strategies to improve speech intelligibility with supervision cues. SLP Short Term Goal 5 (Week 2): Patient will problem solve complex functional tasks with supervision cues. SLP Short Term Goal 6 (Week 2): Patient will tolerate trials of regular textures with supervision cues to recall safe swallowing strategies with no overt s/s of aspiration.  Skilled Therapeutic Interventions: Group treatment; SLP facilitated session with trials of regular textures with no cues or overt s/s of aspiration.  SLP educated both patient and daugher regarding upgrade; however, due to dentetion patient report that PTA he usually consumed soft solids.  During meal of Dys.3 texures and thin liquids he coughed x1 due to decreased oral control of bolus and suspected premature spillage. Hard cough appeard to remove suspected penetrates.  SLP recommeds dischrage from group treatment at this time.        FIM:  Comprehension Comprehension Mode: Auditory Comprehension: 6-Follows complex conversation/direction: With extra time/assistive device Expression Expression: 5-Expresses complex 90% of the time/cues < 10% of the time Social Interaction Social Interaction: 6-Interacts appropriately with others with  medication or extra time (anti-anxiety, antidepressant). Problem Solving Problem Solving: 4-Solves basic 75 - 89% of the time/requires cueing 10 - 24% of the time Memory Memory: 5-Recognizes or recalls 90% of the time/requires cueing < 10% of the time FIM - Eating Eating Activity: 5: Supervision/cues  Pain Pain Assessment Pain Assessment: 0-10  Therapy/Group: Group Therapy  Charlane Ferretti., CCC-SLP [phone]  Lolita Faulds 04/28/2012, 1:24 PM

## 2012-04-28 NOTE — Progress Notes (Signed)
Occupational Therapy Weekly Progress Note  Patient Details  Name: Christopher [name] MRN: [id number] Date of Birth: 1923-12-11  Today's Date: 04/28/2012 Time: 4098-1191 Time Calculation (min): 59 min  Patient has met 3 of 5 short term goals.  Pt still needs min assist for transfers and selfcare at this time.  Also still demonstrates decreased memory and ability to recall back precautions.  Balance continues to improve but pt is still not at a level to safely get up on his on.    Patient continues to demonstrate the following deficits: decreased balance, decreased safety awareness, decreased independent performance of basic ADLs, and therefore will continue to benefit from skilled OT intervention to enhance overall performance with BADL.  Patient progressing toward Christopher term goals..  Continue plan of care.  OT Short Term Goals Week 2:  OT Short Term Goal 1 (Week 2): Pt will perform all bathing sit to stand with AE and supervision. OT Short Term Goal 2 (Week 2): Pt will perform all LB dressing with supervision and AE. OT Short Term Goal 3 (Week 2): Pt will perform toilet transfers and toileting with supervision using 3:1. OT Short Term Goal 4 (Week 2): Pt will recall 3/3 back precautions with no more than min instructional cues.  Skilled Therapeutic Interventions/Progress Updates:    Worked on bathing and dressing at sink level during session.  Pt needs max assist to remove and donn TLSO in sitting to perform UB selfcare.  He is able to utilize the reacher for LB dressing and LB bathing tasks with min instructional cueing for technique.  Needs min assist for sit to stand and standing balance when pulling pants over hips.  Approaching supervision level for functional transfers and sit to stand but still needs min instructional cueing to follow back precautions.    Therapy Documentation Precautions:  Precautions Precautions: Fall;Back Precaution Comments: Pt able to recall 1/3 precautions.   Reeducated multiple times throughout the treatment session with no increase in carryover of the precautions.  Required Braces or Orthoses: Spinal Brace Spinal Brace: Thoracolumbosacral orthotic;Applied in sitting position Spinal Brace Comments: donn/doff in sitting/standing.   Restrictions Weight Bearing Restrictions: No  Pain: Pain Assessment Pain Assessment: 0-10 Pain Score:   3 Pain Type: Chronic pain Pain Location: Neck Pain Descriptors: Aching Pain Frequency: Occasional Pain Onset: Gradual Patients Stated Pain Goal: 2 Pain Intervention(s): Medication (See eMAR) Multiple Pain Sites: No ADL: ADL Equipment Provided: Reacher;Sock aid;Christopher-handled shoe horn Eating: Independent Where Assessed-Eating: Wheelchair Grooming: Supervision/safety Where Assessed-Grooming: Wheelchair;Sitting at sink Upper Body Bathing: Setup Where Assessed-Upper Body Bathing: Wheelchair;Sitting at sink Lower Body Bathing: Minimal assistance Where Assessed-Lower Body Bathing: Sitting at sink;Standing at sink;Wheelchair Upper Body Dressing: Setup (max assist for TLSO) Where Assessed-Upper Body Dressing: Sitting at sink Lower Body Dressing: Minimal assistance Where Assessed-Lower Body Dressing: Standing at sink;Sitting at sink;Wheelchair Toileting: Minimal assistance Where Assessed-Toileting: Hotel manager: Minimal assistance Statistician Method: Proofreader: Gaffer: Minimal Radiation protection practitioner Method: Ship broker: Information systems manager with back Film/video editor: Insurance underwriter Method: Designer, industrial/product: Shower seat with back ADL Comments: Pt still needing min to [employer] cues to recall back precautions.  Will need max assist with donning TLSO as well.  See FIM for current functional status  Therapy/Group: Individual Therapy  Tushar Enns  OTR/L 04/28/2012, 11:07 AM

## 2012-04-29 ENCOUNTER — Inpatient Hospital Stay (HOSPITAL_COMMUNITY): Payer: Medicare Other

## 2012-04-29 NOTE — Progress Notes (Signed)
Patient ID: Christopher [name], male   DOB: 1923-10-11, 78 y.o.   MRN: [id number]  Subjective/Complaints: Right facial pain improved. Tolerating gabapentin  R side neck pain improved Review of Systems  Respiratory: Positive for shortness of breath. Negative for cough, hemoptysis, sputum production and wheezing.        Nocturnal  All other systems reviewed and are negative.   Objective: Vital Signs: Blood pressure 112/68, pulse 76, temperature 97.9 F (36.6 C), temperature source Oral, resp. rate 18, height 5\' 11"  (1.803 m), weight 68.7 kg (151 lb 7.3 oz), SpO2 98.00%. No results found. No results found for this or any previous visit (from the past 72 hour(s)).   HEENT: poor dentition Cardio: RRR Resp: CTA B/L GI: BS positive Extremity:  Pulses positive and No Edema Skin:   Intact Neuro: Alert/Oriented Musc/Skel:  Normal except R upper trap trigger point Motor 4/5 on R side, 5/5 on L side Sensory- intact light touch BUE and BLE Cerebellar no ataxia  Assessment/Plan: 1. Functional deficits secondary to L pontomedullary infarct with balance d/o which require 3+ hours per day of interdisciplinary therapy in a comprehensive inpatient rehab setting. Physiatrist is providing close team supervision and 24 hour management of active medical problems listed below. Physiatrist and rehab team continue to assess barriers to discharge/monitor patient progress toward functional and medical goals. FIM: FIM - Bathing Bathing Steps Patient Completed: Chest;Right Arm;Left Arm;Abdomen;Front perineal area;Buttocks;Right upper leg;Left upper leg;Left lower leg (including foot);Right lower leg (including foot) Bathing: 4: Steadying assist  FIM - Upper Body Dressing/Undressing Upper body dressing/undressing steps patient completed: Thread/unthread right sleeve of pullover shirt/dresss;Thread/unthread left sleeve of pullover shirt/dress;Put head through opening of pull over shirt/dress;Pull shirt over  trunk Upper body dressing/undressing: 5: Supervision: Safety issues/verbal cues FIM - Lower Body Dressing/Undressing Lower body dressing/undressing steps patient completed: Thread/unthread right underwear leg;Thread/unthread left underwear leg;Pull underwear up/down;Pull pants up/down;Thread/unthread left pants leg;Thread/unthread right pants leg Lower body dressing/undressing: 4: Steadying Assist  FIM - Toileting Toileting steps completed by patient: Performs perineal hygiene;Adjust clothing prior to toileting;Adjust clothing after toileting Toileting Assistive Devices: Grab bar or rail for support Toileting: 4: Steadying assist  FIM - Archivist Transfers: 0-Activity did not occur  FIM - Banker Devices: Bed rails;Arm rests Bed/Chair Transfer: 4: Supine > Sit: Min A (steadying Pt. > 75%/lift 1 leg);4: Sit > Supine: Min A (steadying pt. > 75%/lift 1 leg);3: Bed > Chair or W/C: Mod A (lift or lower assist);3: Chair or W/C > Bed: Mod A (lift or lower assist)  FIM - Locomotion: Wheelchair Distance: 300 Locomotion: Wheelchair: 1: Total Assistance/staff pushes wheelchair (Pt<25%) FIM - Locomotion: Ambulation Locomotion: Ambulation Assistive Devices: Walker - Rolling;Orthosis Ambulation/Gait Assistance: 4: Min assist Locomotion: Ambulation: 5: Travels 150 ft or more with supervision/safety issues  Comprehension Comprehension Mode: Auditory Comprehension: 6-Follows complex conversation/direction: With extra time/assistive device  Expression Expression Mode: Verbal Expression: 5-Expresses complex 90% of the time/cues < 10% of the time  Social Interaction Social Interaction: 6-Interacts appropriately with others with medication or extra time (anti-anxiety, antidepressant).  Problem Solving Problem Solving: 4-Solves basic 75 - 89% of the time/requires cueing 10 - 24% of the time  Memory Memory: 5-Recognizes or recalls 90% of the  time/requires cueing < 10% of the time  Medical Problem List and Plan:  1. 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 DVT Prophylaxis/Anticoagulation: Subcutaneous Lovenox. Monitor platelet  counts any signs of bleeding  3. Pain Management:Neck and facial combination of neurogenic and musculoskeletal, add gabapentin -add voltaren gel to right wrist, ? Mild OA  -back brace  4. Neuropsych: This patient is capable of making decisions on his/her own behalf.  5. Recent thoracic T11 compression fracture. Conservative care with a back brace when out of bed  6. Hypertension/second degree AV block/sinus bradycardia.  Followup per cardiology services with conservative care. Avoid AV nodal blocking agents. Monitor for any signs of orthostasis check Orthostatic BPsWill reduce hydrochlorothiazide to 12.5 mg and prinivil to 10mg  7. Dysphagia. Dysphagia 1 nectar liquid diet. Followup per speech therapy. Monitor for any signs of aspiration  8. GERD. Protonix  LOS (Days) 10 A FACE TO FACE EVALUATION WAS PERFORMED  Christopher [name] 04/29/2012, 9:06 AM

## 2012-04-29 NOTE — Progress Notes (Signed)
Physical Therapy Session Note  Patient Details  Name: Christopher [name] MRN: [id number] Date of Birth: 1923-11-25  Today's Date: 04/29/2012 Time: 4098-1191 Time Calculation (min): 55 min  Short Term Goals: Week 1:  PT Short Term Goal 1 (Week 1):  pt will transfer to L and R w/c>< bed wearing TLSO with supervision PT Short Term Goal 1 - Progress (Week 1): Met PT Short Term Goal 2 (Week 1): pt will perform gait wearing TLSO x 150' with supervision PT Short Term Goal 2 - Progress (Week 1): Met PT Short Term Goal 3 (Week 1): pt will ascend and descend 4 steps with 1 rail with supervision PT Short Term Goal 3 - Progress (Week 1): Met PT Short Term Goal 4 (Week 1): wearing TLSO, pt will tolerate standing and reaching out of base of support x 3 minutes during functional activity with supervision PT Short Term Goal 4 - Progress (Week 1): Met PT Short Term Goal 5 (Week 1): pt will demonstrate 3/3 back precautions during functional activities, with 0-1 cues PT Short Term Goal 5 - Progress (Week 1): Met  Skilled Therapeutic Interventions/Progress Updates:  Patient ambulated 240 feet x 3 with min steady assist and rolling walker. Patient performed strengthening exercise on Nustep for 5 minutes at workload 5. Patient up and down 10 steps using bilateral rails and min steady assist. Patient performed side stepping exercise in parallel bars to strengthen hip abduction and adduction.   Therapy Documentation Precautions:  Precautions Precautions: Fall;Back Precaution Comments: Pt able to recall 1/3 precautions.  Reeducated multiple times throughout the treatment session with no increase in carryover of the precautions.  Required Braces or Orthoses: Spinal Brace Spinal Brace: Thoracolumbosacral orthotic;Applied in sitting position Spinal Brace Comments: donn/doff in sitting/standing.   Restrictions Weight Bearing Restrictions: No  Pain: Pain Assessment Pain Assessment: No/denies pain  Locomotion  : Ambulation Ambulation/Gait Assistance: 4: Min guard   See FIM for current functional status  Therapy/Group: Individual Therapy  Alma Friendly 04/29/2012, 3:47 PM

## 2012-04-30 ENCOUNTER — Inpatient Hospital Stay (HOSPITAL_COMMUNITY): Payer: Medicare Other | Admitting: Physical Therapy

## 2012-04-30 LAB — CBC
MCH: 31.4 pg (ref 26.0–34.0)
Platelets: 195 10*3/uL (ref 150–400)
RBC: 3.88 MIL/uL — ABNORMAL LOW (ref 4.22–5.81)

## 2012-04-30 NOTE — Progress Notes (Signed)
Physical Therapy Note  Patient Details  Name: Christopher [name] MRN: [id number] Date of Birth: 1924-05-30 Today's Date: 04/30/2012  Time: 1400-1500 60 minutes  No c/o pain.  Pt participated in group gait training session with RW at min A to close supervision level.  Pt performed obstacle negotiation, side and backward stepping, dynamic gait and stair negotiation all with RW and min A.  Kinetron x 10 minutes for LE strengthening.  Group therapy  Trevaughn Schear 04/30/2012, 1:50 PM

## 2012-04-30 NOTE — Progress Notes (Signed)
Patient ID: Christopher [name], male   DOB: 02-24-1924, 78 y.o.   MRN: [id number]  Subjective/Complaints: Right facial pain improved. Tolerating gabapentin  R side neck pain improved Review of Systems  Respiratory: Positive for shortness of breath. Negative for cough, hemoptysis, sputum production and wheezing.        Nocturnal  All other systems reviewed and are negative.   Objective: Vital Signs: Blood pressure 101/71, pulse 72, temperature 97.9 F (36.6 C), temperature source Oral, resp. rate 20, height 5\' 11"  (1.803 m), weight 68.7 kg (151 lb 7.3 oz), SpO2 1.00%. No results found. Results for orders placed during the hospital encounter of 04/19/12 (from the past 72 hour(s))  CBC     Status: Abnormal   Collection Time   04/30/12  6:34 AM      Component Value Range Comment   WBC 7.9  4.0 - 10.5 K/uL    RBC 3.88 (*) 4.22 - 5.81 MIL/uL    Hemoglobin 12.2 (*) 13.0 - 17.0 g/dL    HCT 40.9 (*) 81.1 - 52.0 %    MCV 92.8  78.0 - 100.0 fL    MCH 31.4  26.0 - 34.0 pg    MCHC 33.9  30.0 - 36.0 g/dL    RDW 91.4  78.2 - 95.6 %    Platelets 195  150 - 400 K/uL      HEENT: poor dentition Cardio: RRR Resp: CTA B/L GI: BS positive Extremity:  Pulses positive and No Edema Skin:   Intact Neuro: Alert/Oriented Musc/Skel:  Normal except R upper trap trigger point Motor 4/5 on R side, 5/5 on L side Sensory- intact light touch BUE and BLE Cerebellar no ataxia  Assessment/Plan: 1. Functional deficits secondary to L pontomedullary infarct with balance d/o which require 3+ hours per day of interdisciplinary therapy in a comprehensive inpatient rehab setting. Physiatrist is providing close team supervision and 24 hour management of active medical problems listed below. Physiatrist and rehab team continue to assess barriers to discharge/monitor patient progress toward functional and medical goals. FIM: FIM - Bathing Bathing Steps Patient Completed: Chest;Right Arm;Left Arm;Abdomen;Front perineal  area;Buttocks;Right upper leg;Left upper leg;Left lower leg (including foot);Right lower leg (including foot) Bathing: 4: Steadying assist  FIM - Upper Body Dressing/Undressing Upper body dressing/undressing steps patient completed: Thread/unthread right sleeve of pullover shirt/dresss;Thread/unthread left sleeve of pullover shirt/dress;Put head through opening of pull over shirt/dress;Pull shirt over trunk Upper body dressing/undressing: 5: Supervision: Safety issues/verbal cues FIM - Lower Body Dressing/Undressing Lower body dressing/undressing steps patient completed: Thread/unthread right underwear leg;Thread/unthread left underwear leg;Pull underwear up/down;Pull pants up/down;Thread/unthread left pants leg;Thread/unthread right pants leg Lower body dressing/undressing: 4: Steadying Assist  FIM - Toileting Toileting steps completed by patient: Performs perineal hygiene;Adjust clothing prior to toileting;Adjust clothing after toileting Toileting Assistive Devices: Grab bar or rail for support Toileting: 4: Steadying assist  FIM - Archivist Transfers: 0-Activity did not occur  FIM - Banker Devices: Bed rails;Arm rests Bed/Chair Transfer: 4: Supine > Sit: Min A (steadying Pt. > 75%/lift 1 leg);4: Sit > Supine: Min A (steadying pt. > 75%/lift 1 leg);3: Bed > Chair or W/C: Mod A (lift or lower assist);3: Chair or W/C > Bed: Mod A (lift or lower assist)  FIM - Locomotion: Wheelchair Distance: 300 Locomotion: Wheelchair: 1: Total Assistance/staff pushes wheelchair (Pt<25%) FIM - Locomotion: Ambulation Locomotion: Ambulation Assistive Devices: Designer, industrial/product Ambulation/Gait Assistance: 4: Min guard Locomotion: Ambulation: 4: Travels 150 ft or more with minimal assistance (  Pt.>75%)  Comprehension Comprehension Mode: Auditory Comprehension: 6-Follows complex conversation/direction: With extra time/assistive  device  Expression Expression Mode: Verbal Expression: 5-Expresses complex 90% of the time/cues < 10% of the time  Social Interaction Social Interaction: 6-Interacts appropriately with others with medication or extra time (anti-anxiety, antidepressant).  Problem Solving Problem Solving: 4-Solves basic 75 - 89% of the time/requires cueing 10 - 24% of the time  Memory Memory: 5-Recognizes or recalls 90% of the time/requires cueing < 10% of the time  Medical Problem List and Plan:  1. 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 DVT Prophylaxis/Anticoagulation: D/C Lovenox.urinary tract signs of bleeding No clinical evidence of DVT 3. Pain Management:Neck and facial combination of neurogenic and musculoskeletal, add gabapentin -add voltaren gel to right wrist, ? Mild OA  -back brace  4. Neuropsych: This patient is capable of making decisions on his/her own behalf.  5. Recent thoracic T11 compression fracture. Conservative care with a back brace when out of bed  6. Hypertension/second degree AV block/sinus bradycardia.  Followup per cardiology services with conservative care. Avoid AV nodal blocking agents. Monitor for any signs of orthostasis check Orthostatic BPsWill reduce hydrochlorothiazide to 12.5 mg and prinivil to 10mg  7. Dysphagia. Dysphagia 1 nectar liquid diet. Followup per speech therapy. Monitor for any signs of aspiration  8. GERD. Protonix  LOS (Days) 11 A FACE TO FACE EVALUATION WAS PERFORMED  KIRSTEINS,ANDREW E 04/30/2012, 9:18 AM

## 2012-05-01 ENCOUNTER — Inpatient Hospital Stay (HOSPITAL_COMMUNITY): Payer: Medicare Other | Admitting: Occupational Therapy

## 2012-05-01 ENCOUNTER — Inpatient Hospital Stay (HOSPITAL_COMMUNITY): Payer: Medicare Other | Admitting: Speech Pathology

## 2012-05-01 ENCOUNTER — Inpatient Hospital Stay (HOSPITAL_COMMUNITY): Payer: Medicare Other | Admitting: Physical Therapy

## 2012-05-01 ENCOUNTER — Telehealth: Payer: Self-pay

## 2012-05-01 NOTE — Telephone Encounter (Signed)
Gillian Scarce, pts daughter request order for hospital bed faxed to [facility] [phone].  I spoke with Darlina Rumpf at [facility] [phone]; she said would need order with current diagnosis and notes supporting current diagnosis. Pt is in hospital after CVA. Darlina Rumpf said would need new order from doctor that has pt hospitalized with discharge summary. Bonita Quin voiced understanding.

## 2012-05-01 NOTE — Progress Notes (Signed)
Speech Language Pathology Daily Session Note and Discharge Summary  Patient Details  Name: Christopher [name] MRN: [id number] Date of Birth: [birth year]  Today's Date: 05/01/2012 Time: 4098-1191 Time Calculation (min): 30 min  Short Term Goals: Week 2: SLP Short Term Goal 1 (Week 2): Patient will tolerate Dys. 3 textures and thin liquids with supervision cues to recall safe swallowing strategies with no overt s/s of aspiration. SLP Short Term Goal 2 (Week 2): Patient will utilize speech intelligibility strategies at the conversational level with supervision cues.  SLP Short Term Goal 3 (Week 2): Patient will answer questions with no more than 2 verbal cues to stay on topic.  SLP Short Term Goal 4 (Week 2): Patient will identify at least two strategies to improve speech intelligibility with supervision cues. SLP Short Term Goal 5 (Week 2): Patient will problem solve complex functional tasks with supervision cues. SLP Short Term Goal 6 (Week 2): Patient will tolerate trials of regular textures with supervision cues to recall safe swallowing strategies with no overt s/s of aspiration.  Skilled Therapeutic Interventions: Session addressed cognition and dysphagia goals with emphasis on final education prior to discharge.  Patient tolerated thin liquids via straw with no overt s/s of aspiration with supervision.  Patient demonstrated anticipatory awareness by problem solving potentially difficult situations that may occur at home after discharge with supervision cues.  SLP re-educated patient about safe swallowing strategies and safety awareness.  Family was not available for education.  Education is complete at this time.     FIM:  Comprehension Comprehension Mode: Auditory Comprehension: 6-Follows complex conversation/direction: With extra time/assistive device Expression Expression Mode: Verbal Expression: 5-Expresses complex 90% of the time/cues < 10% of the time Social Interaction Social  Interaction: 6-Interacts appropriately with others with medication or extra time (anti-anxiety, antidepressant). Problem Solving Problem Solving: 5-Solves basic 90% of the time/requires cueing < 10% of the time Memory Memory: 5-Recognizes or recalls 90% of the time/requires cueing < 10% of the time  Pain Pain Assessment Pain Assessment: No/denies pain  Therapy/Group: Individual Therapy  Page, Joni Reining 05/01/2012, 1:52 PM   Speech Language Pathology Discharge Summary  Patient Details  Name: Christopher [name] MRN: [id number] Date of Birth: 1924/06/07  Today's Date: 05/01/2012  Patient has met 6 of 6 long term goals.  Patient to discharge at overall Mod I - Supervision level.  Reasons goals not met:   n/a  Clinical Impression/Discharge Summary: Patient met 6 out of 6 long term goals while on CIR with gains in dysphagia management and complex problem solving. Patient's diet was upgraded from Dys. 2, nectar thick liquids to Dys. 3, thin liquids. Patient utilizes compensatory swallowing strategies with modified independence during self-feeding tasks. Patient also problem solved complex functional tasks with supervision cues. Patient made functional progress towards speech intelligibility; however, he still exhibits decreased vocal intensity and increased rate of speech which mildly impact his intelligibility.  Patient demonstrated increased topic maintenance with min assist verbal cuing to stay on topic. SLP does not recommend follow up for home health SLP services at this time. SLP completed patient education on 9/23.  Family was not present for education at this time.      Care Partner:  Caregiver Able to Provide Assistance: Yes (intermittent)  Type of Caregiver Assistance: Physical;Cognitive  Recommendation:  None     Equipment:   none  Reasons for discharge: Discharged from hospital   Patient/Family Agrees with Progress Made and Goals Achieved: Yes   See FIM for current functional  status  Jackalyn Lombard, [place]   Graduate Clinician Speech Language Pathology  Page, Joni Reining 05/01/2012, 2:17 PM  The above skilled treatment note has been reviewed and SLP is in agreement. Fae Pippin, M.A., CCC-SLP [phone]

## 2012-05-01 NOTE — Progress Notes (Signed)
Occupational Therapy Session Note  Patient Details  Name: Christopher [name] MRN: [id number] Date of Birth: 10-26-1923  Today's Date: 05/01/2012 Time: 1450-1535 Time Calculation (min): 45 min  Short Term Goals: Week 2:  OT Short Term Goal 1 (Week 2): Pt will perform all bathing sit to stand with AE and supervision. OT Short Term Goal 2 (Week 2): Pt will perform all LB dressing with supervision and AE. OT Short Term Goal 3 (Week 2): Pt will perform toilet transfers and toileting with supervision using 3:1. OT Short Term Goal 4 (Week 2): Pt will recall 3/3 back precautions with no more than min instructional cues.  Skilled Therapeutic Interventions/Progress Updates:    Worked on simulated tub shower transfers using tub bench and RW.  Initially had pt work on stepping over the edge of the tub to get in, which he was able to perform holding onto the grab bar.  At home he has a grab bar but it is on the inside of the tub, not at the front as in this case.  Second attempt was done using the tub bench and sitting down then swinging the LEs in.  He was able to perform this with close supervision.  Feel this is the safest method for him to perform.  Transitioned to standing activity at the high/low table to work on balance and standing endurance.  Pt able to tolerate standing for 5-6 mins before needing rest break secondary to pain.  Once back in pt's room practiced removing TLSO which required min assist and transferring to bed to rest. Pt needing min instructional cueing for correct technique for returning to supine from sitting position.  Therapy Documentation Precautions:  Precautions Precautions: Back;Fall Precaution Comments: Pt able to recall 1/3 precautions.  Reeducated multiple times throughout the treatment session with no increase in carryover of the precautions.  Required Braces or Orthoses: Spinal Brace Spinal Brace: Thoracolumbosacral orthotic;Applied in sitting position Spinal Brace  Comments: donn/doff in sitting/standing.   Restrictions Weight Bearing Restrictions: No  Vital Signs: Therapy Vitals Pulse Rate: 101  Patient Position, if appropriate: Sitting Oxygen Therapy SpO2: 98 % O2 Device: None (Room air) Pulse Oximetry Type: Intermittent Pain: Pain Assessment Pain Assessment: 0-10 Pain Score:   5 Pain Type: Acute pain Pain Location: Back Pain Orientation: Mid;Lower Pain Descriptors: Aching Pain Frequency: Constant Pain Onset: Progressive Patients Stated Pain Goal: 2 Pain Intervention(s): Repositioned Multiple Pain Sites: No ADL: See FIM for current functional status  Therapy/Group: Individual Therapy  Sukhman Martine OTR/L 05/01/2012, 4:33 PM

## 2012-05-01 NOTE — Progress Notes (Signed)
Social Work Patient ID: Christopher [name], male   DOB: March 23, 1924, 78 y.o.   MRN: [id number] Met with pt and son to inform need for family education, son reports they were here over the weekend and know how to move pt with his brace This is not new, he assisted pt with brace prior to admission for his CVA.  He does want him to have a hospital bed at home upon discharge. Pt does qualify due to he needs to elevate himself due to his compression fracture T11 in bed and is not able in a regular bed.  Have made a referral to [facility] to deliver Tomorrow.  Son and daughter in-law report they will be in and out at pt's home.  Feel ready for discharge Wed.

## 2012-05-01 NOTE — Progress Notes (Signed)
Patient ID: Christopher [name], male   DOB: Dec 29, 1923, 78 y.o.   MRN: [id number]  Subjective/Complaints: Right facial pain improved. Tolerating gabapentin  R side neck pain improved Still with some mid back pain  Can I go to [facility] by myself? Review of Systems  Respiratory: Positive for shortness of breath. Negative for cough, hemoptysis, sputum production and wheezing.        Nocturnal  All other systems reviewed and are negative.   Objective: Vital Signs: Blood pressure 144/77, pulse 52, temperature 97.4 F (36.3 C), temperature source Oral, resp. rate 18, height 5\' 11"  (1.803 m), weight 68.7 kg (151 lb 7.3 oz), SpO2 96.00%. No results found. Results for orders placed during the hospital encounter of 04/19/12 (from the past 72 hour(s))  CBC     Status: Abnormal   Collection Time   04/30/12  6:34 AM      Component Value Range Comment   WBC 7.9  4.0 - 10.5 K/uL    RBC 3.88 (*) 4.22 - 5.81 MIL/uL    Hemoglobin 12.2 (*) 13.0 - 17.0 g/dL    HCT 95.6 (*) 21.3 - 52.0 %    MCV 92.8  78.0 - 100.0 fL    MCH 31.4  26.0 - 34.0 pg    MCHC 33.9  30.0 - 36.0 g/dL    RDW 08.6  57.8 - 46.9 %    Platelets 195  150 - 400 K/uL      HEENT: poor dentition Cardio: RRR Resp: CTA B/L GI: BS positive Extremity:  Pulses positive and No Edema Skin:   Intact Neuro: Alert/Oriented Musc/Skel:  Normal except R upper trap trigger point Motor 4/5 on R side, 5/5 on L side Sensory- intact light touch BUE and BLE Cerebellar no ataxia  Assessment/Plan: 1. Functional deficits secondary to L pontomedullary infarct with balance d/o which require 3+ hours per day of interdisciplinary therapy in a comprehensive inpatient rehab setting. Physiatrist is providing close team supervision and 24 hour management of active medical problems listed below. Physiatrist and rehab team continue to assess barriers to discharge/monitor patient progress toward functional and medical goals. FIM: FIM - Bathing Bathing Steps Patient  Completed: Chest;Right Arm;Left Arm;Abdomen;Front perineal area;Buttocks;Right upper leg;Left upper leg;Left lower leg (including foot);Right lower leg (including foot) Bathing: 4: Steadying assist  FIM - Upper Body Dressing/Undressing Upper body dressing/undressing steps patient completed: Thread/unthread right sleeve of pullover shirt/dresss;Thread/unthread left sleeve of pullover shirt/dress;Put head through opening of pull over shirt/dress;Pull shirt over trunk Upper body dressing/undressing: 5: Supervision: Safety issues/verbal cues FIM - Lower Body Dressing/Undressing Lower body dressing/undressing steps patient completed: Thread/unthread right underwear leg;Thread/unthread left underwear leg;Pull underwear up/down;Pull pants up/down;Thread/unthread left pants leg;Thread/unthread right pants leg Lower body dressing/undressing: 4: Steadying Assist  FIM - Toileting Toileting steps completed by patient: Performs perineal hygiene;Adjust clothing prior to toileting;Adjust clothing after toileting Toileting Assistive Devices: Grab bar or rail for support Toileting: 4: Steadying assist  FIM - Archivist Transfers: 0-Activity did not occur  FIM - Banker Devices: Bed rails;Arm rests Bed/Chair Transfer: 4: Supine > Sit: Min A (steadying Pt. > 75%/lift 1 leg);4: Sit > Supine: Min A (steadying pt. > 75%/lift 1 leg);3: Bed > Chair or W/C: Mod A (lift or lower assist);3: Chair or W/C > Bed: Mod A (lift or lower assist)  FIM - Locomotion: Wheelchair Distance: 300 Locomotion: Wheelchair: 1: Total Assistance/staff pushes wheelchair (Pt<25%) FIM - Locomotion: Ambulation Locomotion: Ambulation Assistive Devices: Designer, industrial/product Ambulation/Gait Assistance:  4: Min guard Locomotion: Ambulation: 4: Travels 150 ft or more with minimal assistance (Pt.>75%)  Comprehension Comprehension Mode: Auditory Comprehension: 6-Follows complex  conversation/direction: With extra time/assistive device  Expression Expression Mode: Verbal Expression: 5-Expresses complex 90% of the time/cues < 10% of the time  Social Interaction Social Interaction: 6-Interacts appropriately with others with medication or extra time (anti-anxiety, antidepressant).  Problem Solving Problem Solving: 4-Solves basic 75 - 89% of the time/requires cueing 10 - 24% of the time  Memory Memory: 5-Recognizes or recalls 90% of the time/requires cueing < 10% of the time  Medical Problem List and Plan:  1. 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 DVT Prophylaxis/Anticoagulation: Subcutaneous Lovenox. Monitor platelet counts any signs of bleeding  3. Pain Management:Neck and facial combination of neurogenic and musculoskeletal, add gabapentin -add voltaren gel to right wrist, ? Mild OA  -back brace  4. Neuropsych: This patient is capable of making decisions on his/her own behalf.  5. Recent thoracic T11 compression fracture. Conservative care with a back brace when out of bed  6. Hypertension/second degree AV block/sinus bradycardia.  Followup per cardiology services with conservative care. Avoid AV nodal blocking agents. Monitor for any signs of orthostasis check Orthostatic BPsWill reduce hydrochlorothiazide to 12.5 mg and prinivil to 10mg  7. Dysphagia. Dysphagia 1 nectar liquid diet. Followup per speech therapy. Monitor for any signs of aspiration  8. GERD. Protonix  LOS (Days) 12 A FACE TO FACE EVALUATION WAS PERFORMED  KIRSTEINS,ANDREW E 05/01/2012, 7:59 AM

## 2012-05-01 NOTE — Progress Notes (Signed)
Occupational Therapy Session Note  Patient Details  Name: Christopher [name] MRN: [id number] Date of Birth: 14-Jul-1924  Today's Date: 05/01/2012 Time: 1015-1100 Time Calculation (min): 45 min  Short Term Goals: Week 1:  OT Short Term Goal 1 (Week 1): Pt will be able to don pants over feet with supervision. OT Short Term Goal 1 - Progress (Week 1): Met OT Short Term Goal 2 (Week 1): Pt will don socks with min assist. OT Short Term Goal 2 - Progress (Week 1): Met OT Short Term Goal 3 (Week 1): Pt will don shoes with min to mod assist. OT Short Term Goal 3 - Progress (Week 1): Met OT Short Term Goal 4 (Week 1): Pt will ambulated to toilet with RW with supervision. OT Short Term Goal 4 - Progress (Week 1): Progressing toward goal OT Short Term Goal 5 (Week 1): Pt will toilet with supervision. OT Short Term Goal 5 - Progress (Week 1): Progressing toward goal Week 2:  OT Short Term Goal 1 (Week 2): Pt will perform all bathing sit to stand with AE and supervision. OT Short Term Goal 2 (Week 2): Pt will perform all LB dressing with supervision and AE. OT Short Term Goal 3 (Week 2): Pt will perform toilet transfers and toileting with supervision using 3:1. OT Short Term Goal 4 (Week 2): Pt will recall 3/3 back precautions with no more than min instructional cues.  Skilled Therapeutic Interventions/Progress Updates:    Pt seen for BADL retraining with use of AE for shower and dressing. Pt did very well using equipment to wash feet and don clothing.  Good dynamic standing balance.  Pt needed 1 verbal cue to not bend forward.  Therapy Documentation Precautions:  Precautions Precautions: Fall;Back Precaution Comments: Pt able to recall 1/3 precautions.  Reeducated multiple times throughout the treatment session with no increase in carryover of the precautions.  Required Braces or Orthoses: Spinal Brace Spinal Brace: Thoracolumbosacral orthotic;Applied in sitting position Spinal Brace Comments:  donn/doff in sitting/standing.   Restrictions Weight Bearing Restrictions: No   Pain: Pain Assessment Pain Assessment: No/denies pain  ADL: See FIM for current functional status  Therapy/Group: Individual Therapy  SAGUIER,JULIA 05/01/2012, 12:28 PM

## 2012-05-01 NOTE — Progress Notes (Signed)
Physical Therapy Note  Patient Details  Name: MILOH ALCOCER MRN: [id number] Date of Birth: 04-05-1924 Today's Date: 05/01/2012  1191-4782 (50 minutes) individual Pain: no reported pain Focus of treatment: Gait training on level/unlevel surfaces /steps Treatment: Pt ambulated from gym to ground floor with RW ( > 500 feet) SBA using elevator before seated rest break; pt instructed in using RW on unlevel grass as a pickup walker; up/down 4 steps with one rail SBA; discussed up/down steps with one rail and folder RW.  [name],JIM 05/01/2012, 7:35 AM

## 2012-05-02 ENCOUNTER — Inpatient Hospital Stay (HOSPITAL_COMMUNITY): Payer: Medicare Other | Admitting: *Deleted

## 2012-05-02 ENCOUNTER — Telehealth: Payer: Self-pay | Admitting: Family Medicine

## 2012-05-02 ENCOUNTER — Inpatient Hospital Stay (HOSPITAL_COMMUNITY): Payer: Medicare Other | Admitting: Occupational Therapy

## 2012-05-02 ENCOUNTER — Inpatient Hospital Stay (HOSPITAL_COMMUNITY): Payer: Medicare Other | Admitting: Physical Therapy

## 2012-05-02 LAB — BASIC METABOLIC PANEL
CO2: 29 mEq/L (ref 19–32)
Calcium: 9.8 mg/dL (ref 8.4–10.5)
GFR calc Af Amer: 75 mL/min — ABNORMAL LOW (ref >90)
GFR calc non Af Amer: 64 mL/min — ABNORMAL LOW (ref >90)
Sodium: 138 mEq/L (ref 135–145)

## 2012-05-02 MED ORDER — LISINOPRIL 5 MG PO TABS
5.0000 mg | ORAL_TABLET | Freq: Every day | ORAL | Status: DC
  Administered 2012-05-03: 5 mg via ORAL
  Filled 2012-05-02 (×2): qty 1

## 2012-05-02 NOTE — Discharge Summary (Signed)
  Discharge summary job [id number]

## 2012-05-02 NOTE — Discharge Summary (Signed)
[name], [name]:  [id number]  MEDICAL RECORD NO.:  [id number]  LOCATION:  4036                         FACILITY:  [facility]  PHYSICIAN:  Erick Colace, M.D.DATE OF BIRTH:  May 02, 1924  DATE OF ADMISSION:  04/19/2012 DATE OF DISCHARGE:05/03/2012                              DISCHARGE SUMMARY   DISCHARGE DIAGNOSES: 1. Acute brainstem infarction at the left pontomedullary junction felt     to be thrombotic secondary to high-grade proximal basilar terminal     left vertebral stenosis. 2. Subcutaneous Lovenox for deep vein thrombosis prophylaxis, pain     management, recent thoracic T11 compression fracture with     conservative care, hypertension with bradycardia, dysphagia, and     gastroesophageal reflux disease.  This is an 78 year old right-handed male with history of hypertension chronic back pain with associated vertebral fractures who was recently discharged from the hospital with thoracic T11 compression fracture.  Admitted on April 15, 2012, with right-sided weakness and slurred speech.  MRI of the brain showed small acute brainstem infarct in the left pontomedullary junction without mass effect.  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  The patient did not receive tPA.  Echocardiogram with ejection fraction of 60% and no regional wall motion abnormalities.  Carotid Doppler showed no ICA stenosis.  Neurology Service was consulted, placed on Plavix therapy as well as aspirin for CVA prophylaxis and subcutaneous Lovenox for DVT prophylaxis.  Bouts of orthostatic hypotension, received fluid bolus and monitored.  Cardiology Services consulted in reference to telemetry monitor showing episodes of AV block.  It was felt that this was episodic AV block represented advanced degeneration of his conduction system at some point in the future possibly may need a pacemaker.  He was advised conservative care for now and  monitoring from a distance as the patient was asymptomatic.  Speech therapy follow up for a dysphagia 1 nectar thick liquid diet.  The patient continued to wear back brace from recent thoracic compression fractures.  The patient was admitted for comprehensive rehab program.  PAST MEDICAL HISTORY:  See discharge diagnoses.  SOCIAL HISTORY:  Lives alone 1 level home, 1 step to entry.  Functional history prior to admission is retired, he did have family check on him throughout the day.  Functional status prior to admission was min assist to ambulate 75 feet with a rolling walker, supervision to scoot to the edge of the bed.  PHYSICAL EXAMINATION:  VITAL SIGNS:  Blood pressure 132/68, pulse 76, and temperature 98.6 respirations 18.  GENERAL:  This was an alert male.  He was able to give appropriate age, date of birth.  He followed 3 step commands.  Cognition was appropriate.  HEENT:  Pupils are round and reactive to light.  LUNGS:  Clear to auscultation.  CARDIAC:  Rate regular rate and rhythm.  ABDOMEN:  Soft and nontender.  Good bowel sounds.  REHABILITATION HOSPITAL COURSE:  The patient was admitted to inpatient rehab services with therapies initiated on a 3-hour daily basis consisting of physical therapy, occupational therapy, speech therapy, and rehabilitation nursing.  The following issues were addressed during the patient's rehabilitation stay.  Pertaining to [name] left pontomedullary junction thrombotic CVA, he remained stable maintained on Plavix therapy.  Subcutaneous Lovenox ongoing for DVT prophylaxis with no bleeding episodes.  Pain management, neurogenic musculoskeletal in nature Neurontin was added.  He did have some nonspecific wrist discomfort secondary to osteoarthritis with Voltaren gel added.  Noted recent thoracic T11 compression fractures with conservative care.  Back brace in place that he was able to place when sitting at the edge of the bed.  His  blood pressures were well controlled.  He had seen Cardiology Service Dr. Johney Frame early in his hospital course for AV block noted to avoid AV nodal blocking agents.  Monitor for now.  There was some concern he may need a pacemaker in the future.  Blood pressures were controlled with hydrochlorothiazide and lisinopril.  His diet was steadily advanced to mechanical soft thin liquids which he tolerated well.  No signs of aspiration followed by speech therapy.  The patient received weekly collaborative interdisciplinary team conferences to discuss estimated length of stay, family teaching, and any barriers to discharge.  He was continent of bowel and bladder using a urinal supervision bed mobility for back precautions, minimal assist to ambulate 80 feet with a rolling walker, as well as minimal assist for transfers.  He was able to communicate his needs without any problems. He was to be discharged to home with family with family teaching completed.  DISCHARGE MEDICATIONS:  At the time of dictation included aspirin 81 mg daily, Plavix 75 mg daily, Voltaren gel 4 times daily as needed to affected area, Neurontin 300 mg 3 times daily, hydrochlorothiazide 12.5 mg daily, lisinopril 10 mg daily, Claritin 10 mg daily, Robaxin 750 mg every 8 hours as needed for muscle spasms.  Multivitamin daily. Oxycodone immediate release 5 mg one tablet every 4 hours as needed for pain, Protonix 40 mg daily.  Potassium chloride 20 mEq daily.  DIET:  Mechanical soft thin liquid diet.  SPECIAL INSTRUCTIONS:  The patient should follow up with Dr. Claudette Laws at the outpatient rehab center as advised.  Dr. Sharen Hones medical management appointment made May 23, 2012.  Dr. Delia Heady, Neurology Services call for appointment 1 month.  The patient should follow up with Dr. Johney Frame, Cardiology Services. [place] [facility] in accordance to AV block consideration of pacemaker in the future which the patient is  currently asymptomatic.  He should continue with his back brace when out of bed.  For recent thoracic compression fractures.  Home health therapies had been arranged as per rehab services.     [name], P.A.   ______________________________ Erick Colace, M.D.    DA/MEDQ  D:  05/02/2012  T:  05/02/2012  Job:  [id number]  cc:   Eustaquio Boyden, MD Pramod P. Pearlean Brownie, MD

## 2012-05-02 NOTE — Progress Notes (Signed)
Physical Therapy Session Note  Patient Details  Name: Christopher [name] MRN: [id number] Date of Birth: 12-25-1923  Today's Date: 05/02/2012 Time: 0915-0930 Time Calculation (min): 15 min  Short Term Goals: See d/c goals  Skilled Therapeutic Interventions/Progress Updates:    stair training and gait training- pt says he is frustrated he can not sustain a full upright posture for prolonged times but he does self correct, pt S on flight steps and mod I with gait, cadence 1.79 and increased fall risk <1.8; pt feels he is ready for d/c home; discussed progression after d/c; pt adhered to precautions and had no increased LOB with conversation during gait Pain-reports he just took pain meds for "some back pain" no further intervention required Therapy Documentation Precautions:  Precautions: Back;Fall  Required Braces or Orthoses: Spinal Brace Spinal Brace: Thoracolumbosacral orthotic;Applied in sitting position Spinal Brace Comments: donn/doff in sitting/standing.   Restrictions Weight Bearing Restrictions: No See FIM for current functional status  Therapy/Group: Individual Therapy  Michaelene Song 05/02/2012, 9:36 AM

## 2012-05-02 NOTE — Progress Notes (Signed)
Occupational Therapy Session Note  Patient Details  Name: Christopher [name] MRN: [id number] Date of Birth: 1923/08/18  Today's Date: 05/02/2012 Time: 4098-1191 Time Calculation (min): 60 min  Short Term Goals: Week 1:  OT Short Term Goal 1 (Week 1): Pt will be able to don pants over feet with supervision. OT Short Term Goal 1 - Progress (Week 1): Met OT Short Term Goal 2 (Week 1): Pt will don socks with min assist. OT Short Term Goal 2 - Progress (Week 1): Met OT Short Term Goal 3 (Week 1): Pt will don shoes with min to mod assist. OT Short Term Goal 3 - Progress (Week 1): Met OT Short Term Goal 4 (Week 1): Pt will ambulated to toilet with RW with supervision. OT Short Term Goal 4 - Progress (Week 1): Progressing toward goal OT Short Term Goal 5 (Week 1): Pt will toilet with supervision. OT Short Term Goal 5 - Progress (Week 1): Progressing toward goal Week 2:  OT Short Term Goal 1 (Week 2): Pt will perform all bathing sit to stand with AE and supervision. OT Short Term Goal 2 (Week 2): Pt will perform all LB dressing with supervision and AE. OT Short Term Goal 3 (Week 2): Pt will perform toilet transfers and toileting with supervision using 3:1. OT Short Term Goal 4 (Week 2): Pt will recall 3/3 back precautions with no more than min instructional cues.  Skilled Therapeutic Interventions/Progress Updates:   Pt seen for BADL retraining of toileting, bathing, and dressing with a focus on pt recalling and maintaining back precautions, dynamic balance and use of AE.  Pt did well maintaining precautions without cues.  Used AE well.  Pt is ready for discharge.     Therapy Documentation Pain: Pain Assessment Pain Assessment: No/denies pain ADL: ADL Equipment Provided: Reacher;Sock aid;Long-handled shoe horn Eating: Independent Where Assessed-Eating: Wheelchair Grooming: Modified independent Where Assessed-Grooming: Standing at sink Upper Body Bathing: Independent Where Assessed-Upper Body  Bathing: Shower Lower Body Bathing: Supervision/safety Where Assessed-Lower Body Bathing: Shower Upper Body Dressing: Setup Where Assessed-Upper Body Dressing: Sitting at sink Lower Body Dressing: Supervision/safety Where Assessed-Lower Body Dressing: Standing at sink;Sitting at sink;Wheelchair Toileting: Modified independent Where Assessed-Toileting: Teacher, adult education: Engineer, agricultural Method: Proofreader: Gaffer: International aid/development worker Method: Ship broker: Information systems manager with back Film/video editor: Close supervision Film/video editor Method: Designer, industrial/product: Shower seat without back  See FIM for current functional status  Therapy/Group: Individual Therapy  Maccoy Haubner 05/02/2012, 11:23 AM

## 2012-05-02 NOTE — Progress Notes (Signed)
Occupational Therapy Session Note  Patient Details  Name: Christopher [name] MRN: [id number] Date of Birth: [birth year]  Today's Date: 05/02/2012 Time: 1430-1500 Time Calculation (min): 30 min  Short Term Goals: Week 2:  OT Short Term Goal 1 (Week 2): Pt will perform all bathing sit to stand with AE and supervision. OT Short Term Goal 2 (Week 2): Pt will perform all LB dressing with supervision and AE. OT Short Term Goal 3 (Week 2): Pt will perform toilet transfers and toileting with supervision using 3:1. OT Short Term Goal 4 (Week 2): Pt will recall 3/3 back precautions with no more than min instructional cues.  Skilled Therapeutic Interventions/Progress Updates:    1:1 OT with focus on back precautions and carryover in functional tasks.  Pt in bed upon arrival, donned TLSO sitting on EOB with min assist and cues for technique.  Discussed placement of TLSO in room at home when in bed and not having to wear it, but being able to obtain it without breaking back precautions.  Pt able to recall 2 of 3 back precautions, required cues to recall no bending.  Pt ambulated to toilet with RW at mod I level and completed toileting without physical assist or cues.  Therapy Documentation Precautions:  Precautions Precautions: Back;Fall Precaution Comments: Pt able to recall 1/3 precautions.  Reeducated multiple times throughout the treatment session with no increase in carryover of the precautions.  Required Braces or Orthoses: Spinal Brace Spinal Brace: Thoracolumbosacral orthotic;Applied in sitting position Spinal Brace Comments: donn/doff in sitting/standing.   Restrictions Weight Bearing Restrictions: No Pain: Pain Assessment Pain Assessment: 0-10 Pain Score:   7  See FIM for current functional status  Therapy/Group: Individual Therapy  Leonette Monarch 05/02/2012, 3:24 PM

## 2012-05-02 NOTE — Telephone Encounter (Signed)
Pt discharged yesterday from hospital and currently has 2 appts in October (14th and 15th) - can we verify which one he will be keeping?  (one made last visit, one made while in hospital.)

## 2012-05-02 NOTE — Progress Notes (Signed)
Physical Therapy Note  Patient Details  Name: Christopher [name] MRN: [id number] Date of Birth: 1924-04-24 Today's Date: 05/02/2012  Time: 1115-1145 30 minutes  No c/o pain.  Pt educated in and performed [place] exercise program for LE strengthening and balance and to reduce risk of falls at home.  Pt able to follow program with supervision of PT.  Pt reports he feels comfortable performing this HEP at home.  Individual therapy   Otha Monical 05/02/2012, 1:00 PM

## 2012-05-02 NOTE — Progress Notes (Signed)
Occupational Therapy Discharge Summary  Patient Details  Name: Christopher [name] MRN: [id number] Date of Birth: 01-Dec-1923  Today's Date: 05/02/2012  Patient has met 6 of 6 long term goals due to improved activity tolerance, improved balance and improved coordination.  Patient to discharge at overall Supervision level with bathing and dressing and mod I for toileting, toilet transfers and functional mobility within his home.  Patient's care partner is independent to provide the necessary physical assistance at discharge.    Reasons goals not met: n/a  Recommendation: No further OT services recommended.  Equipment: none  Reasons for discharge: treatment goals met  Patient/family agrees with progress made and goals achieved: Yes  OT Discharge   Pain Pain Assessment Pain Assessment: No/denies pain ADL ADL Equipment Provided: Reacher;Sock aid;Long-handled shoe horn Eating: Independent Where Assessed-Eating: Wheelchair Grooming: Modified independent Where Assessed-Grooming: Standing at sink Upper Body Bathing: Independent Where Assessed-Upper Body Bathing: Shower Lower Body Bathing: Supervision/safety Where Assessed-Lower Body Bathing: Shower Upper Body Dressing: Setup Where Assessed-Upper Body Dressing: Sitting at sink Lower Body Dressing: Supervision/safety Where Assessed-Lower Body Dressing: Standing at sink;Sitting at sink;Wheelchair Toileting: Modified independent Where Assessed-Toileting: Neurosurgeon Method: Proofreader: Gaffer: International aid/development worker Method: Ship broker: Information systems manager with back Film/video editor: Close supervision Film/video editor Method: Designer, industrial/product: Information systems manager without back  Vision/Perception  Vision - History Baseline Vision: Wears glasses only for reading Patient Visual  Report: No change from baseline Vision - Assessment Eye Alignment: Within Chemical engineer Perception: Within Functional Limits Praxis Praxis: Intact  Cognition Overall Cognitive Status: Appears within functional limits for tasks assessed Sensation Sensation Stereognosis: Appears Intact Hot/Cold: Appears Intact Proprioception: Appears Intact Coordination Gross Motor Movements are Fluid and Coordinated: Yes Fine Motor Movements are Fluid and Coordinated: Yes Motor  Motor Motor - Discharge Observations: WFL for basic mobility Mobility  - refer to FIM    Trunk/Postural Assessment  Cervical Assessment Cervical Assessment: Within Functional Limits Thoracic Assessment Thoracic Assessment:  (kyphotic) Lumbar Assessment Lumbar Assessment:  (kyphotic) Postural Control Postural Control: Within Functional Limits  Balance Static Sitting Balance Static Sitting - Level of Assistance: 7: Independent Static Standing Balance Static Standing - Level of Assistance: 6: Modified independent (Device/Increase time) Dynamic Standing Balance Dynamic Standing - Level of Assistance: 6: Modified independent (Device/Increase time) Extremity/Trunk Assessment RUE Assessment RUE Assessment: Within Functional Limits LUE Assessment LUE Assessment: Within Functional Limits  See FIM for current functional status  SAGUIER,JULIA 05/02/2012, 11:03 AM

## 2012-05-02 NOTE — Progress Notes (Signed)
Patient ID: Christopher [name], male   DOB: 07-Feb-1924, 78 y.o.   MRN: [id number]  Subjective/Complaints: Right facial pain improved. Tolerating gabapentin  R side neck pain improved Still with some mid back pain  Can I go to BR by myself? Review of Systems  Respiratory: Positive for shortness of breath. Negative for cough, hemoptysis, sputum production and wheezing.        Nocturnal  All other systems reviewed and are negative.   Objective: Vital Signs: Blood pressure 106/67, pulse 53, temperature 97.6 F (36.4 C), temperature source Oral, resp. rate 16, height 5\' 11"  (1.803 m), weight 68.7 kg (151 lb 7.3 oz), SpO2 93.00%. No results found. Results for orders placed during the hospital encounter of 04/19/12 (from the past 72 hour(s))  CBC     Status: Abnormal   Collection Time   04/30/12  6:34 AM      Component Value Range Comment   WBC 7.9  4.0 - 10.5 K/uL    RBC 3.88 (*) 4.22 - 5.81 MIL/uL    Hemoglobin 12.2 (*) 13.0 - 17.0 g/dL    HCT 11.9 (*) 14.7 - 52.0 %    MCV 92.8  78.0 - 100.0 fL    MCH 31.4  26.0 - 34.0 pg    MCHC 33.9  30.0 - 36.0 g/dL    RDW 82.9  56.2 - 13.0 %    Platelets 195  150 - 400 K/uL      HEENT: poor dentition Cardio: RRR Resp: CTA B/L GI: BS positive Extremity:  Pulses positive and No Edema Skin:   Intact Neuro: Alert/Oriented Musc/Skel:  Normal            Motor 4/5 on R side, 5/5 on L side Sensory- intact light touch BUE and BLE Cerebellar no ataxia  Assessment/Plan: 1. Functional deficits secondary to L pontomedullary infarct with balance d/o which require 3+ hours per day of interdisciplinary therapy in a comprehensive inpatient rehab setting. Physiatrist is providing close team supervision and 24 hour management of active medical problems listed below. Physiatrist and rehab team continue to assess barriers to discharge/monitor patient progress toward functional and medical goals. FIM: FIM - Bathing Bathing Steps Patient Completed: Chest;Right  Arm;Left Arm;Abdomen;Front perineal area;Buttocks;Right upper leg;Left upper leg;Left lower leg (including foot);Right lower leg (including foot) Bathing: 5: Supervision: Safety issues/verbal cues  FIM - Upper Body Dressing/Undressing Upper body dressing/undressing steps patient completed: Thread/unthread right sleeve of pullover shirt/dresss;Thread/unthread left sleeve of pullover shirt/dress;Put head through opening of pull over shirt/dress;Pull shirt over trunk Upper body dressing/undressing: 5: Set-up assist to: Apply TLSO, cervical collar FIM - Lower Body Dressing/Undressing Lower body dressing/undressing steps patient completed: Thread/unthread right underwear leg;Thread/unthread left underwear leg;Pull underwear up/down;Pull pants up/down;Thread/unthread left pants leg;Thread/unthread right pants leg Lower body dressing/undressing: 5: Supervision: Safety issues/verbal cues  FIM - Toileting Toileting steps completed by patient: Performs perineal hygiene;Adjust clothing prior to toileting;Adjust clothing after toileting Toileting Assistive Devices: Grab bar or rail for support Toileting: 4: Steadying assist  FIM - Archivist Transfers: 0-Activity did not occur  FIM - Banker Devices: Bed rails;Arm rests Bed/Chair Transfer: 4: Supine > Sit: Min A (steadying Pt. > 75%/lift 1 leg);4: Sit > Supine: Min A (steadying pt. > 75%/lift 1 leg);3: Bed > Chair or W/C: Mod A (lift or lower assist);3: Chair or W/C > Bed: Mod A (lift or lower assist)  FIM - Locomotion: Wheelchair Distance: 300 Locomotion: Wheelchair: 1: Total Assistance/staff pushes wheelchair (Pt<25%) FIM -  Locomotion: Ambulation Locomotion: Ambulation Assistive Devices: Designer, industrial/product Ambulation/Gait Assistance: 4: Min guard Locomotion: Ambulation: 4: Travels 150 ft or more with minimal assistance (Pt.>75%)  Comprehension Comprehension Mode: Auditory Comprehension:  6-Follows complex conversation/direction: With extra time/assistive device  Expression Expression Mode: Verbal Expression: 5-Expresses complex 90% of the time/cues < 10% of the time  Social Interaction Social Interaction: 6-Interacts appropriately with others with medication or extra time (anti-anxiety, antidepressant).  Problem Solving Problem Solving: 4-Solves basic 75 - 89% of the time/requires cueing 10 - 24% of the time  Memory Memory: 5-Recognizes or recalls 90% of the time/requires cueing < 10% of the time  Medical Problem List and Plan:  1. 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 DVT Prophylaxis/Anticoagulation: Subcutaneous Lovenox. Monitor platelet counts any signs of bleeding  3. Pain Management:Neck and facial combination of neurogenic and musculoskeletal, add gabapentin -add voltaren gel to right wrist, ? Mild OA  -back brace  4. Neuropsych: This patient is capable of making decisions on his/her own behalf.  5. Recent thoracic T11 compression fracture. Conservative care with a back brace when out of bed  6. Hypertension/second degree AV block/sinus bradycardia.  Followup per cardiology services with conservative care. Avoid AV nodal blocking agents. Monitor for any signs of orthostasis check Orthostatic BPsWill reduce hydrochlorothiazide to 12.5 mg and prinivil to 5mg , enc fluids 7. Dysphagia. Dysphagia 1 nectar liquid diet. Followup per speech therapy. Monitor for any signs of aspiration  8. GERD. Protonix  LOS (Days) 13 A FACE TO FACE EVALUATION WAS PERFORMED  KIRSTEINS,ANDREW E 05/02/2012, 8:07 AM

## 2012-05-02 NOTE — Progress Notes (Signed)
Physical Therapy Session Note  Patient Details  Name: Christopher [name] MRN: [id number] Date of Birth: July 04, 1924  Today's Date: 05/02/2012 Time:  8:30-9:15 ( )  Skilled Therapeutic Interventions/Progress Updates:  Tx focused on maintaining back precautions during functional mobility, gait training, and balance.  Pt performed supine>sit Mod I with elevated HOB as pt will have hospital bed at home.  Pt donned UE and LE clothes sitting EOB with set-up assist only, and assist for tightening TLSO. Pt able to recall 2/3 back precautions, forgetting no twisting, but does no break this precaution during tx.  Dynamic sitting balance EOB Mod I while dressing x67min, maintaining back precautions.  Sit<>stand with Mod I with safe hand placement and increased time required.  Gait training in controlled environment with RW and S x 150' and pt able to adjust posture independently, acknowledging back precautions.   Pain 7/10, nursing came to provide pain meds. Adjusted tx prn.   Car transfer with S and safe hand placement noted, maintaining back precautions.     Therapy Documentation Precautions: Spinal precautions. TLSO sitting EOB    See FIM for current functional status  Therapy/Group: Individual Therapy  Virl Cagey, PT 05/02/2012, 8:41 AM

## 2012-05-03 MED ORDER — LISINOPRIL 5 MG PO TABS: 5.0000 mg | ORAL_TABLET | Freq: Every day | ORAL | Status: DC

## 2012-05-03 MED ORDER — CLOPIDOGREL BISULFATE 75 MG PO TABS: 75.0000 mg | ORAL_TABLET | Freq: Every day | ORAL | Status: DC

## 2012-05-03 MED ORDER — ADULT MULTIVITAMIN W/MINERALS CH: 1.0000 | ORAL_TABLET | Freq: Every day | ORAL | Status: DC

## 2012-05-03 MED ORDER — DICLOFENAC SODIUM 1 % TD GEL: 1.0000 "application " | Freq: Four times a day (QID) | TRANSDERMAL | Status: DC

## 2012-05-03 MED ORDER — HYDROCHLOROTHIAZIDE 12.5 MG PO CAPS: 12.5000 mg | ORAL_CAPSULE | Freq: Every day | ORAL | Status: DC

## 2012-05-03 MED ORDER — POTASSIUM CHLORIDE CRYS ER 20 MEQ PO TBCR
30.0000 meq | EXTENDED_RELEASE_TABLET | Freq: Every day | ORAL | Status: DC
  Filled 2012-05-03: qty 1

## 2012-05-03 MED ORDER — GABAPENTIN 300 MG PO CAPS: 300.0000 mg | ORAL_CAPSULE | Freq: Three times a day (TID) | ORAL | Status: DC

## 2012-05-03 MED ORDER — ASPIRIN 81 MG PO TBEC: 81.0000 mg | DELAYED_RELEASE_TABLET | Freq: Every day | ORAL | Status: DC

## 2012-05-03 MED ORDER — POTASSIUM CHLORIDE CRYS ER 20 MEQ PO TBCR: 20.0000 meq | EXTENDED_RELEASE_TABLET | Freq: Every day | ORAL | Status: DC

## 2012-05-03 NOTE — Progress Notes (Signed)
Patient discharged with family to home at 1021. Discharge instructions provided by Deatra Ina, PA. Patient and family verbalized understanding. Hedy Camara

## 2012-05-03 NOTE — Progress Notes (Signed)
Patient ID: Christopher [name], male   DOB: [birth year], 78 y.o.   MRN: [id number]  Subjective/Complaints: Right facial pain improved. Tolerating gabapentin  R side neck pain improved Still with some mid back pain  Up in rm mod I without issues Review of Systems  Respiratory: Positive for shortness of breath. Negative for cough, hemoptysis, sputum production and wheezing.        Nocturnal  All other systems reviewed and are negative.   Objective: Vital Signs: Blood pressure 128/71, pulse 80, temperature 97.5 F (36.4 C), temperature source Oral, resp. rate 19, height 5\' 11"  (1.803 m), weight 68.7 kg (151 lb 7.3 oz), SpO2 97.00%. No results found. Results for orders placed during the hospital encounter of 04/19/12 (from the past 72 hour(s))  BASIC METABOLIC PANEL     Status: Abnormal   Collection Time   05/02/12  8:55 AM      Component Value Range Comment   Sodium 138  135 - 145 mEq/L    Potassium 3.3 (*) 3.5 - 5.1 mEq/L    Chloride 98  96 - 112 mEq/L    CO2 29  19 - 32 mEq/L    Glucose, Bld 86  70 - 99 mg/dL    BUN 25 (*) 6 - 23 mg/dL    Creatinine, Ser 4.09  0.50 - 1.35 mg/dL    Calcium 9.8  8.4 - 81.1 mg/dL    GFR calc non Af Amer 64 (*) >90 mL/min    GFR calc Af Amer 75 (*) >90 mL/min      HEENT: poor dentition Cardio: RRR Resp: CTA B/L GI: BS positive Extremity:  Pulses positive and No Edema Skin:   Intact Neuro: Alert/Oriented Musc/Skel:  Normal            Motor 4/5 on R side, 5/5 on L side Sensory- intact light touch BUE and BLE Cerebellar no ataxia  Assessment/Plan: 1. Functional deficits secondary to L pontomedullary infarct stable for D/C   FIM: FIM - Bathing Bathing Steps Patient Completed: Chest;Right Arm;Left Arm;Abdomen;Front perineal area;Buttocks;Right upper leg;Left upper leg;Left lower leg (including foot);Right lower leg (including foot) Bathing: 5: Supervision: Safety issues/verbal cues  FIM - Upper Body Dressing/Undressing Upper body  dressing/undressing steps patient completed: Thread/unthread right sleeve of pullover shirt/dresss;Thread/unthread left sleeve of pullover shirt/dress;Put head through opening of pull over shirt/dress;Pull shirt over trunk Upper body dressing/undressing: 5: Set-up assist to: Apply TLSO, cervical collar FIM - Lower Body Dressing/Undressing Lower body dressing/undressing steps patient completed: Thread/unthread right underwear leg;Pull underwear up/down;Thread/unthread right pants leg;Thread/unthread left pants leg;Thread/unthread left underwear leg;Pull pants up/down;Don/Doff right sock;Don/Doff left sock;Don/Doff right shoe;Don/Doff left shoe Lower body dressing/undressing: 5: Supervision: Safety issues/verbal cues  FIM - Toileting Toileting steps completed by patient: Adjust clothing prior to toileting;Performs perineal hygiene;Adjust clothing after toileting Toileting Assistive Devices: Grab bar or rail for support Toileting: 6: More than reasonable amount of time  FIM - Diplomatic Services operational officer Devices: Art gallery manager Transfers: 6-More than reasonable amt of time  FIM - Banker Devices: Walker;HOB elevated;Orthosis (TLSO) Bed/Chair Transfer: 6: Assistive device: no helper  FIM - Locomotion: Wheelchair Distance: 300 Locomotion: Wheelchair: 1: Total Assistance/staff pushes wheelchair (Pt<25%) FIM - Locomotion: Ambulation Locomotion: Ambulation Assistive Devices: Designer, industrial/product Ambulation/Gait Assistance: 6: Modified independent (Device/Increase time) Locomotion: Ambulation: 5: Travels 150 ft or more with supervision/safety issues  Comprehension Comprehension Mode: Auditory Comprehension: 6-Follows complex conversation/direction: With extra time/assistive device  Expression Expression Mode: Verbal Expression: 5-Expresses complex 90%  of the time/cues < 10% of the time  Social Interaction Social Interaction: 6-Interacts  appropriately with others with medication or extra time (anti-anxiety, antidepressant).  Problem Solving Problem Solving: 4-Solves basic 75 - 89% of the time/requires cueing 10 - 24% of the time  Memory Memory: 5-Recognizes or recalls 90% of the time/requires cueing < 10% of the time  Medical Problem List and Plan:  1. 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 DVT Prophylaxis/Anticoagulation: Subcutaneous Lovenox. Monitor platelet counts any signs of bleeding  3. Pain Management:Neck and facial combination of neurogenic and musculoskeletal, add gabapentin -add voltaren gel to right wrist, ? Mild OA  -back brace  4. Neuropsych: This patient is capable of making decisions on his/her own behalf.  5. Recent thoracic T11 compression fracture. Conservative care with a back brace when out of bed  6. Hypertension/second degree AV block/sinus bradycardia.  Followup per cardiology services with conservative care. Avoid AV nodal blocking agents. Monitor for any signs of orthostasis check Orthostatic BPsWill reduce hydrochlorothiazide to 12.5 mg and prinivil to 5mg , enc fluids 7. Dysphagia. Dysphagia 1 nectar liquid diet. Followup per speech therapy. Monitor for any signs of aspiration  8. GERD. Protonix 9.  Mild hypo K , K dur  LOS (Days) 14 A FACE TO FACE EVALUATION WAS PERFORMED  KIRSTEINS,ANDREW E 05/03/2012, 9:54 AM

## 2012-05-03 NOTE — Progress Notes (Signed)
Social Work Discharge Note Discharge Note  The overall goal for the admission was met for:   Discharge location: Yes-HOME WITH ASSISTANCE FROM SON AND D-I-L, DTR CHECKS ON  Length of Stay: Yes-13 DAYS  Discharge activity level: Yes-SUPERVISION /MOD/I LEVEL  Home/community participation: Yes  Services provided included: MD, RD, PT, OT, SLP, RN, TR, Pharmacy and SW  Financial Services: Medicare  Follow-up services arranged: Home Health: [facility]-PT,OT, RN, DME: [facility]-HOSPITAL BED and Patient/Family request agency HH: PREF HAD BEFORE, DME: PREF HAD BEFORE  Comments (or additional information):LIMITED FAMILY EDUCATION COMPLETED DUE TO SON FELT KNEW HOW TO ASSIST WITH BRACE-DONE PRIOR TO ADMISSION. AWARE OF ALTERNATIVES, IE ASSISTED LIVING, HIRED ASSIST AND ADULT DAY CARE  Patient/Family verbalized understanding of follow-up arrangements: Yes  Individual responsible for coordination of the follow-up plan: [name]-SON  Confirmed correct DME delivered: Lucy Chris 05/03/2012    Lucy Chris

## 2012-05-03 NOTE — Telephone Encounter (Signed)
Confirmed with patient's son and he will keep October 14th appt. October 15th cancelled.

## 2012-05-03 NOTE — Progress Notes (Signed)
Physical Therapy Discharge Summary  Patient Details  Name: Christopher [name] MRN: [id number] Date of Birth: Aug 10, 1923  Today's Date: 05/03/2012    Patient has met 8 of 8 long term goals due to improved activity tolerance, improved balance, improved postural control, increased strength, decreased pain, ability to compensate for deficits, functional use of  right upper extremity and right lower extremity, improved attention and improved coordination.  Patient to discharge at an ambulatory level Modified Independent.   Patient's care partner is independent to provide the necessary physical assistance for donning/doffing his TLSO at discharge. Family will be checking on pt multiple times per day.  Reasons goals not met: n/a  Recommendation:  Patient will benefit from ongoing skilled PT services in home health setting to continue to advance safe functional mobility, address ongoing impairments in strength, balance, coordination, motor control, and minimize fall risk.  Equipment: No equipment provided  Reasons for discharge: treatment goals met and discharge from hospital  Patient/family agrees with progress made and goals achieved: Yes  PT Discharge Precautions/Restrictions- falls, back precautions.  Pt is not always able to state 3/3 precautions, but demonstrates them during mobility and locomotion activities.    Pain- low back,  rated 7/10; premedicated     Cognition - alert and oriented x 4  Sensation Sensation Light Touch: Appears Intact Proprioception: Appears Intact Motor  Motor Motor: Hemiplegia Motor - Discharge Observations: mild hemiparesis  Mobility Bed Mobility Bed Mobility: Rolling Right;Rolling Left;Right Sidelying to Sit;Supine to Sit;Sit to Supine Rolling Right: 6: Modified independent (Device/Increase time) Rolling Left: 6: Modified independent (Device/Increase time) Right Sidelying to Sit: 6: Modified independent (Device/Increase time);HOB elevated (will have  hospital bed at home) Sit to Supine: 6: Modified independent (Device/Increase time) Transfers Sit to Stand: 6: Modified independent (Device/Increase time) Stand to Sit: 6: Modified independent (Device/Increase time) Locomotion  Ambulation Ambulation/Gait Assistance: 6: Modified independent (Device/Increase time) Assistive device: Rolling walker Gait Gait: Yes Gait velocity: 1.79 ft/sec Stairs / Additional Locomotion Stairs: Yes Stairs Assistance: 5: Supervision Stair Management Technique: One rail Right Number of Stairs: 15  Wheelchair Mobility Wheelchair Mobility: No  Trunk/Postural Assessment  Cervical Assessment Cervical Assessment: Within Functional Limits Thoracic Assessment Thoracic Assessment: Within Functional Limits (kyphotic) Postural Control Postural Control: Within Functional Limits  Balance Static Sitting Balance Static Sitting - Level of Assistance: 7: Independent Static Standing Balance Static Standing - Level of Assistance: 6: Modified independent (Device/Increase time) Dynamic Standing Balance Dynamic Standing - Level of Assistance: 6: Modified independent (Device/Increase time) Extremity Assessment      RLE Assessment RLE Assessment: Exceptions to [facility] RLE Strength RLE Overall Strength: Within Functional Limits for tasks assessed RLE Overall Strength Comments: R hip abduction weakness noted by narrow BOS LLE Assessment LLE Assessment: Within Functional Limits (L hip abduction weakness noted by narrow BOS)  See FIM for current functional status  Christopher [name] 05/03/2012, 3:45 PM

## 2012-05-09 HISTORY — PX: OTHER SURGICAL HISTORY: SHX169

## 2012-05-22 ENCOUNTER — Ambulatory Visit (INDEPENDENT_AMBULATORY_CARE_PROVIDER_SITE_OTHER): Payer: Medicare Other | Admitting: Family Medicine

## 2012-05-22 ENCOUNTER — Encounter: Payer: Self-pay | Admitting: Family Medicine

## 2012-05-22 VITALS — BP 136/82 | HR 52 | Temp 97.5°F | Wt 170.2 lb

## 2012-05-22 DIAGNOSIS — IMO0002 Reserved for concepts with insufficient information to code with codable children: Secondary | ICD-10-CM

## 2012-05-22 DIAGNOSIS — M810 Age-related osteoporosis without current pathological fracture: Secondary | ICD-10-CM

## 2012-05-22 DIAGNOSIS — I6350 Cerebral infarction due to unspecified occlusion or stenosis of unspecified cerebral artery: Secondary | ICD-10-CM

## 2012-05-22 DIAGNOSIS — I639 Cerebral infarction, unspecified: Secondary | ICD-10-CM

## 2012-05-22 DIAGNOSIS — T148XXA Other injury of unspecified body region, initial encounter: Secondary | ICD-10-CM

## 2012-05-22 DIAGNOSIS — I441 Atrioventricular block, second degree: Secondary | ICD-10-CM

## 2012-05-22 NOTE — Assessment & Plan Note (Signed)
2 months out.  Discussed decreased use of brace.  Will obtain dexa and start bisphosphonate if appropriate prior to stopping brace.  Also asked them to get Dr. Charm Rings opinion on duration.

## 2012-05-22 NOTE — Assessment & Plan Note (Signed)
Check Dexa then likely restart bisphosphonate. Lab Results  Component Value Date   CREATININE 1.02 05/02/2012

## 2012-05-22 NOTE — Progress Notes (Signed)
  Subjective:    Patient ID: Christopher [name], male    DOB: 11-24-1923, 78 y.o.   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  Stroke presented with R sided weakness and slurred speech.  He also had episodes of AV block, but these were just followed as pt remained asxs.  Cardiology followed while in hospital.  He did have dysphagia but diet was graduated to normal prior to discharge.  Still having some trouble swallowing.  Records reviewed: echo WNL, carotid US WNL.  On plavix and aspirin.  Pain currently controlled with tylenol Q6 hours during day and oxycodone at night x2.  Discharged Wednesday 05/03/2012.  Has HH PT/OT working with him.  RN coming as well.  No speech therapist.    Bradycardia - no chest pain/tightness, SOB, headaches or dizziness.  Does endorse occasional tachycardia at night time.  Resolved with deep breaths.  Denies anxiety related to tight brace.  Pt prefers to monitor for now.    Stooling well.  No constipation.  Has f/u with Dr. Fredna Dow next Tuesday. Has f/u with Dr. Pearlean Brownie in 06/30/2012.  Past Medical History  Diagnosis Date  . GERD (gastroesophageal reflux disease) 08/1996  . Hyperlipemia 08/1986  . Hypertension Pre 1996  . Osteoporosis 12/2004  . Pneumonia 22 yoa  . History of Doppler echocardiogram 05/1995    MVP, MOD AI, MILD TR, MILD MR  . Elevated PSA     decided against further eval  . Diverticulosis   . Bilateral sensorineural hearing loss 02/2012    some conductive left side  . Compression fracture     multiple  . AV block, 2nd degree 04/2012    Past Surgical History  Procedure Date  . Cholecystectomy 05/1995  . Exercise treadmill 09/12/2006   NML  . Inguinal hernia repair 08/14/2010    Dr. Daphine Deutscher  . Esophagogastroduodenoscopy 08/18/2004    H. H. Dr. Russella Dar  . Colonoscopy 05/01/2007    divericulosis, int hemorrhoids - Dr. Russella Dar    Review of Systems Per HPI    Objective:   Physical Exam  Nursing note and vitals reviewed. Constitutional: He appears well-developed and well-nourished. No distress.  HENT:  Head: Normocephalic and atraumatic.  Mouth/Throat: Oropharynx is clear and moist. No oropharyngeal exudate.  Eyes: Conjunctivae normal and EOM are normal. Pupils are equal, round, and reactive to light. No scleral icterus.  Neck: Normal range of motion. Neck supple.  Cardiovascular: Regular rhythm, normal heart sounds and intact distal pulses.  Bradycardia present.   No murmur heard. Pulmonary/Chest: Effort normal and breath sounds normal. No respiratory distress. He has no wheezes. He has no rales.  Musculoskeletal: He exhibits edema (1+ pedal edema).  Lymphadenopathy:    He has no cervical adenopathy.  Neurological:       5/5 strength BUE  Skin: Skin is warm and dry. No rash noted.  Psychiatric: He has a normal mood and affect.       Bright affect       Assessment & Plan:

## 2012-05-22 NOTE — Assessment & Plan Note (Signed)
Asxs, now endorsing some night time tachycardia.  ?sss.  Pt declines cards eval currently, but son will monitor sxs and let us know if worsening for referral to cards.

## 2012-05-22 NOTE — Patient Instructions (Addendum)
Lets hold off on fosamax until we get bone density scan. Pass by Marion's office to schedule bone density scan.  Good to see you today - call us with questions.

## 2012-05-22 NOTE — Assessment & Plan Note (Signed)
S/p small brainstem infarct.  Receiving [facility] care. On plavix and aspirin. bp acceptable. Lab Results  Component Value Date   CHOL 131 04/16/2012   HDL 31* 04/16/2012   LDLCALC 80 04/16/2012   TRIG 100 04/16/2012   CHOLHDL 4.2 04/16/2012  FLP acceptable.  LDL goal <70.

## 2012-05-23 ENCOUNTER — Ambulatory Visit: Payer: Medicare Other | Admitting: Family Medicine

## 2012-05-23 ENCOUNTER — Inpatient Hospital Stay: Payer: Medicare Other | Admitting: Physical Medicine & Rehabilitation

## 2012-05-29 ENCOUNTER — Ambulatory Visit (INDEPENDENT_AMBULATORY_CARE_PROVIDER_SITE_OTHER)
Admission: RE | Admit: 2012-05-29 | Discharge: 2012-05-29 | Disposition: A | Discharge status: Home / Self Care | Payer: Medicare Other | Source: Ambulatory Visit

## 2012-05-29 DIAGNOSIS — M810 Age-related osteoporosis without current pathological fracture: Secondary | ICD-10-CM

## 2012-05-29 DIAGNOSIS — T148XXA Other injury of unspecified body region, initial encounter: Secondary | ICD-10-CM

## 2012-05-29 DIAGNOSIS — IMO0002 Reserved for concepts with insufficient information to code with codable children: Secondary | ICD-10-CM

## 2012-05-30 ENCOUNTER — Encounter: Payer: Medicare Other | Attending: Physical Medicine & Rehabilitation

## 2012-05-30 ENCOUNTER — Encounter: Payer: Self-pay | Admitting: Physical Medicine & Rehabilitation

## 2012-05-30 ENCOUNTER — Ambulatory Visit (HOSPITAL_BASED_OUTPATIENT_CLINIC_OR_DEPARTMENT_OTHER): Payer: Medicare Other | Admitting: Physical Medicine & Rehabilitation

## 2012-05-30 VITALS — BP 130/89 | HR 81 | Resp 14 | Ht 71.0 in | Wt 168.2 lb

## 2012-05-30 DIAGNOSIS — X58XXXA Exposure to other specified factors, initial encounter: Secondary | ICD-10-CM | POA: Insufficient documentation

## 2012-05-30 DIAGNOSIS — R059 Cough, unspecified: Secondary | ICD-10-CM | POA: Insufficient documentation

## 2012-05-30 DIAGNOSIS — R5381 Other malaise: Secondary | ICD-10-CM | POA: Insufficient documentation

## 2012-05-30 DIAGNOSIS — T148XXA Other injury of unspecified body region, initial encounter: Secondary | ICD-10-CM

## 2012-05-30 DIAGNOSIS — R269 Unspecified abnormalities of gait and mobility: Secondary | ICD-10-CM | POA: Insufficient documentation

## 2012-05-30 DIAGNOSIS — R062 Wheezing: Secondary | ICD-10-CM | POA: Insufficient documentation

## 2012-05-30 DIAGNOSIS — R0602 Shortness of breath: Secondary | ICD-10-CM | POA: Insufficient documentation

## 2012-05-30 DIAGNOSIS — R42 Dizziness and giddiness: Secondary | ICD-10-CM | POA: Insufficient documentation

## 2012-05-30 DIAGNOSIS — I6350 Cerebral infarction due to unspecified occlusion or stenosis of unspecified cerebral artery: Secondary | ICD-10-CM | POA: Insufficient documentation

## 2012-05-30 DIAGNOSIS — M7989 Other specified soft tissue disorders: Secondary | ICD-10-CM | POA: Insufficient documentation

## 2012-05-30 DIAGNOSIS — S22009A Unspecified fracture of unspecified thoracic vertebra, initial encounter for closed fracture: Secondary | ICD-10-CM | POA: Insufficient documentation

## 2012-05-30 DIAGNOSIS — R5383 Other fatigue: Secondary | ICD-10-CM | POA: Insufficient documentation

## 2012-05-30 DIAGNOSIS — I639 Cerebral infarction, unspecified: Secondary | ICD-10-CM

## 2012-05-30 DIAGNOSIS — R05 Cough: Secondary | ICD-10-CM | POA: Insufficient documentation

## 2012-05-30 DIAGNOSIS — F29 Unspecified psychosis not due to a substance or known physiological condition: Secondary | ICD-10-CM | POA: Insufficient documentation

## 2012-05-30 DIAGNOSIS — IMO0002 Reserved for concepts with insufficient information to code with codable children: Secondary | ICD-10-CM

## 2012-05-30 MED ORDER — TRAMADOL HCL 50 MG PO TABS: 100.0000 mg | ORAL_TABLET | Freq: Every day | ORAL | Status: DC

## 2012-05-30 NOTE — Progress Notes (Signed)
Subjective:    Patient ID: Christopher [name], male    DOB: 1923-10-18, 78 y.o.   MRN: [id number]  HPI Admitted on April 15, 2012, with right-sided weakness and slurred  speech. MRI of the brain showed small acute brainstem infarct in the  left pontomedullary junction without mass effect. 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 The patient did not  receive tPA. Echocardiogram with ejection fraction of 60% and no  regional wall motion abnormalities. Carotid Doppler showed no ICA  stenosis. Neurology Service was consulted, placed on Plavix therapy as  well as aspirin for CVA prophylaxis and subcutaneous Lovenox for DVT  prophylaxis.  Sustained T11 compression fracture in August. Wearing TLSO. Pain has improved. Imaging studies showed a compression of the superior aspect of T. 11 vertebral body no evidence of burst fracture. No evidence of displacement.  Pain Inventory Average Pain 5 Pain Right Now 3 My pain is dull  In the last 24 hours, has pain interfered with the following? General activity 5 Relation with others 5 Enjoyment of life 5 What TIME of day is your pain at its worst? morning Sleep (in general) Fair  Pain is worse with: walking Pain improves with: medication Relief from Meds: 3  Mobility use a walker how many minutes can you walk? 5 ability to climb steps?  no do you drive?  no  Function retired I need assistance with the following:  dressing, bathing, toileting, meal prep, household duties and shopping  Neuro/Psych bladder control problems bowel control problems weakness numbness tingling trouble walking dizziness confusion anxiety  Prior Studies bone density  Physicians involved in your care Any changes since last visit?  no   Family History  Problem Relation Age of Onset  . Heart disease Mother     MI  . Hypertension Mother   . Stroke Mother     ?  . Stroke Father   . Diabetes Sister   . Hypertension Sister    . Hypertension Sister   . Hypertension Sister   . Stroke Sister     recurrent mini strokes  . Cancer Neg Hx    History   Social History  . Marital Status: Single    Spouse Name: N/A    Number of Children: N/A  . Years of Education: N/A   Occupational History  . [employer]/Retired     Parttime delivers flowers   Social History Main Topics  . Smoking status: Former Games developer  . Smokeless tobacco: Former Neurosurgeon   Comment: smoked minimal as teen  . Alcohol Use: No  . Drug Use: No  . Sexually Active: No   Other Topics Concern  . None   Social History Narrative   Caffeine: 2-3 cups/day, 1 gallon iced teaLives alone.  Widower.  2 dogs at home.Lots of driving.Activity: walking - 44min-1hr dailyDiet: some water, vegetables daily, occasional fruits   Past Surgical History  Procedure Date  . Cholecystectomy 05/1995  . Exercise treadmill 09/12/2006    NML  . Inguinal hernia repair 08/14/2010    Dr. Daphine Deutscher  . Esophagogastroduodenoscopy 08/18/2004    [facility]. H. Dr. Russella Dar  . Colonoscopy 05/01/2007    divericulosis, int hemorrhoids - Dr. Russella Dar   Past Medical History  Diagnosis Date  . GERD (gastroesophageal reflux disease) 08/1996  . Hyperlipemia 08/1986  . Hypertension Pre 1996  . Osteoporosis 12/2004  . Pneumonia 22 yoa  . History of Doppler echocardiogram 05/1995    MVP, MOD  AI, MILD TR, MILD MR  . Elevated PSA     decided against further eval  . Diverticulosis   . Bilateral sensorineural hearing loss 02/2012    some conductive left side  . Compression fracture     multiple  . AV block, 2nd degree 04/2012   BP 130/89  Pulse 81  Resp 14  Ht 5\' 11"  (1.803 m)  Wt 168 lb 3.2 oz (76.295 kg)  BMI 23.46 kg/m2  SpO2 95%   Review of Systems  Respiratory: Positive for cough, shortness of breath and wheezing.   Cardiovascular: Positive for leg swelling.  Gastrointestinal:       Bowel control  Genitourinary:       Bladder control  Musculoskeletal: Positive for gait  problem.  Neurological: Positive for dizziness, weakness and numbness.       Tingling  Hematological: Bruises/bleeds easily.  Psychiatric/Behavioral: Positive for confusion. The patient is nervous/anxious.   All other systems reviewed and are negative.       Objective:   Physical Exam Ambulates with walker. No evidence of toe drag or knee instability Lower body strength is 5/5 in the hip flexors knee extensors ankle dorsiflexors and plantar flexors Upper body strength is 5/5 in the deltoid, biceps, triceps, grip Sensation is intact to light touch in upper and lower extremities No evidence of facial droop       Assessment & Plan:  1.  CVA left pontomedullary junction no residual weakness 2. T11 compression fracture without neurologic compromise. Continue TLSO for one more month then will x-ray out of TLSO and possibly discontinue TLSO at that time

## 2012-05-30 NOTE — Patient Instructions (Signed)
You may finish the oxycodone prescription before you start taking the tramadol. I'll see back in one month. If you're doing well at that time we will rex-ray her back and determine whether we can get you out of the brace No driving for now Continue using the walker when you walk outside without any assistance

## 2012-06-12 ENCOUNTER — Encounter: Payer: Self-pay | Admitting: *Deleted

## 2012-06-12 ENCOUNTER — Encounter: Payer: Self-pay | Admitting: Family Medicine

## 2012-06-12 ENCOUNTER — Other Ambulatory Visit: Payer: Self-pay | Admitting: Family Medicine

## 2012-06-12 MED ORDER — ALENDRONATE SODIUM 70 MG PO TABS: 70.0000 mg | ORAL_TABLET | ORAL | Status: DC

## 2012-06-13 ENCOUNTER — Encounter: Payer: Self-pay | Admitting: Family Medicine

## 2012-06-13 ENCOUNTER — Ambulatory Visit (INDEPENDENT_AMBULATORY_CARE_PROVIDER_SITE_OTHER): Payer: Medicare Other | Admitting: Family Medicine

## 2012-06-13 VITALS — BP 140/90 | HR 94 | Temp 97.6°F | Wt 163.2 lb

## 2012-06-13 DIAGNOSIS — R002 Palpitations: Secondary | ICD-10-CM

## 2012-06-13 DIAGNOSIS — F329 Major depressive disorder, single episode, unspecified: Secondary | ICD-10-CM | POA: Insufficient documentation

## 2012-06-13 DIAGNOSIS — F32A Depression, unspecified: Secondary | ICD-10-CM | POA: Insufficient documentation

## 2012-06-13 DIAGNOSIS — I441 Atrioventricular block, second degree: Secondary | ICD-10-CM

## 2012-06-13 NOTE — Assessment & Plan Note (Addendum)
See above. Second degree AV block found on tele strip during hospitalization.  Will refer back to cards for further assistance with management.

## 2012-06-13 NOTE — Progress Notes (Signed)
  Subjective:    Patient ID: Christopher [name], male    DOB: [birth year], 78 y.o.   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 Stroke presented with R sided weakness and slurred speech.   During hospitalization, he was found to have episodes of 2nd degree AV block on top of baseline 1st degree AV block, but these were just followed as pt remained asxs. Seen by Dr. Johney Frame, thought may eventually need pacemaker.  During hospitalization: echo WNL, carotid US WNL. Continues plavix and aspirin  Presents today to discuss possible further evaluation of heart.  Endorses intermittent heart fluttering randomly throughout the day.  Lasts less than 2 minutes.  Occasional mild chest tightness and shortness of breath during these episodes.  Deep breaths improve this.  Has not tried any medication for this.  Wonders if becoming depressed after CVA.  2+ week h/o depressed mood.  Some increased trouble falling asleep recently.  Decreased interest - anhedonia.  Energy level down.  Focus/concentration level "average".  Appetite good.  Denies SI/HI.  No h/o depression in past.   Is getting out more with family which does help.  HHRN has stopped coming to house.  Past Medical History  Diagnosis Date  . GERD (gastroesophageal reflux disease) 08/1996  . Hyperlipemia 08/1986  . Hypertension Pre 1996  . Osteoporosis 12/2004    dexa T -2.8 (05/2012), T11 compression fx  . Pneumonia 22 yoa  . History of Doppler echocardiogram 05/1995    MVP, MOD AI, MILD TR, MILD MR  . Elevated PSA     decided against further eval  . Diverticulosis   . Bilateral sensorineural hearing loss 02/2012    some conductive  left side  . Compression fracture     multiple  . AV block, 2nd degree 04/2012     Review of Systems Per HPI    Objective:   Physical Exam  Nursing note and vitals reviewed. Constitutional: He appears well-developed and well-nourished. No distress.  HENT:  Head: Normocephalic and atraumatic.  Mouth/Throat: Oropharynx is clear and moist. No oropharyngeal exudate.  Neck: Normal range of motion. Neck supple.  Cardiovascular: Normal rate and intact distal pulses.  An irregular rhythm present.  Occasional extrasystoles are present.  Murmur (2/6 SEM) heard. Pulmonary/Chest: Effort normal and breath sounds normal. No respiratory distress. He has no wheezes. He has no rales.       Back brace in place  Musculoskeletal: He exhibits no edema.  Lymphadenopathy:    He has no cervical adenopathy.  Skin: Skin is warm and dry. No rash noted.  Psychiatric: He has a normal mood and affect.       Assessment & Plan:

## 2012-06-13 NOTE — Patient Instructions (Addendum)
It is common to have some depression after a major limiting event like you have had. Lets start sertraline (zoloft) at 25mg  daily. We will try to set up home health for you again temporarily.  Look into private company for nursing aide.  Pamphlet provided. Pass by Marion's office to schedule appointment with cardiology for further evaluation. Good to se you today,call us with questions.

## 2012-06-13 NOTE — Assessment & Plan Note (Addendum)
In h/o 2nd degree AV block.  Now with symptomatic palpitations. Will refer to cards for further evaluation. Pt has seen Dr. Johney Frame in hospital.  May need PPM. Given change in status with new palpitations and continued homebound status (2/2 fall risk, disability, back pain) will set up with [facility].

## 2012-06-13 NOTE — Assessment & Plan Note (Signed)
Meets criteria for MDD, started after recent CVA and significant decrease in functional status. Discussed mood.  Pt agrees to trial of SSRI - start zoloft at 25mg  daily.

## 2012-06-14 ENCOUNTER — Telehealth: Payer: Self-pay

## 2012-06-14 MED ORDER — SERTRALINE HCL 25 MG PO TABS: 25.0000 mg | ORAL_TABLET | Freq: Every day | ORAL | Status: DC

## 2012-06-14 NOTE — Telephone Encounter (Signed)
Patient notified

## 2012-06-14 NOTE — Telephone Encounter (Signed)
i apologize didn't send in.  plz notify sent in.

## 2012-06-14 NOTE — Telephone Encounter (Signed)
Pt's wife left v/m [facility] does not have Zoloft prescription that pt thought was sent when seen 05/13/12. Not on med list.Please advise.

## 2012-06-15 ENCOUNTER — Telehealth: Payer: Self-pay | Admitting: Family Medicine

## 2012-06-15 DIAGNOSIS — R002 Palpitations: Secondary | ICD-10-CM

## 2012-06-15 DIAGNOSIS — I441 Atrioventricular block, second degree: Secondary | ICD-10-CM

## 2012-06-15 NOTE — Telephone Encounter (Signed)
Patients daughter called asking about a Cardiology referral to Dr Johney Frame. I looked in patients chart and saw that you said we would do a referral to cards. Called and made appt with Dr Johney Frame for 07/14/2012 at 3:15pm, called daughter back and gave her the appt information.

## 2012-06-16 NOTE — Telephone Encounter (Signed)
Great thank you.  Placed referral in chart.

## 2012-06-27 ENCOUNTER — Telehealth: Payer: Self-pay

## 2012-06-27 ENCOUNTER — Ambulatory Visit (HOSPITAL_BASED_OUTPATIENT_CLINIC_OR_DEPARTMENT_OTHER): Payer: Medicare Other | Admitting: Physical Medicine & Rehabilitation

## 2012-06-27 ENCOUNTER — Encounter: Payer: Self-pay | Admitting: Physical Medicine & Rehabilitation

## 2012-06-27 ENCOUNTER — Encounter: Payer: Medicare Other | Attending: Physical Medicine & Rehabilitation

## 2012-06-27 VITALS — BP 146/77 | HR 72 | Ht 71.0 in | Wt 161.0 lb

## 2012-06-27 DIAGNOSIS — R062 Wheezing: Secondary | ICD-10-CM | POA: Insufficient documentation

## 2012-06-27 DIAGNOSIS — R059 Cough, unspecified: Secondary | ICD-10-CM | POA: Insufficient documentation

## 2012-06-27 DIAGNOSIS — R0602 Shortness of breath: Secondary | ICD-10-CM | POA: Insufficient documentation

## 2012-06-27 DIAGNOSIS — F29 Unspecified psychosis not due to a substance or known physiological condition: Secondary | ICD-10-CM | POA: Insufficient documentation

## 2012-06-27 DIAGNOSIS — S22009A Unspecified fracture of unspecified thoracic vertebra, initial encounter for closed fracture: Secondary | ICD-10-CM | POA: Insufficient documentation

## 2012-06-27 DIAGNOSIS — IMO0002 Reserved for concepts with insufficient information to code with codable children: Secondary | ICD-10-CM

## 2012-06-27 DIAGNOSIS — T148XXA Other injury of unspecified body region, initial encounter: Secondary | ICD-10-CM

## 2012-06-27 DIAGNOSIS — I6350 Cerebral infarction due to unspecified occlusion or stenosis of unspecified cerebral artery: Secondary | ICD-10-CM | POA: Insufficient documentation

## 2012-06-27 DIAGNOSIS — R05 Cough: Secondary | ICD-10-CM | POA: Insufficient documentation

## 2012-06-27 DIAGNOSIS — R42 Dizziness and giddiness: Secondary | ICD-10-CM | POA: Insufficient documentation

## 2012-06-27 DIAGNOSIS — M7989 Other specified soft tissue disorders: Secondary | ICD-10-CM | POA: Insufficient documentation

## 2012-06-27 DIAGNOSIS — R5381 Other malaise: Secondary | ICD-10-CM | POA: Insufficient documentation

## 2012-06-27 DIAGNOSIS — R269 Unspecified abnormalities of gait and mobility: Secondary | ICD-10-CM | POA: Insufficient documentation

## 2012-06-27 DIAGNOSIS — X58XXXA Exposure to other specified factors, initial encounter: Secondary | ICD-10-CM | POA: Insufficient documentation

## 2012-06-27 NOTE — Progress Notes (Signed)
Subjective:    Patient ID: Christopher [name], male    DOB: July 06, 1924, 78 y.o.   MRN: [id number] Admitted on April 15, 2012, with right-sided weakness and slurred  speech. MRI of the brain showed small acute brainstem infarct in the  left pontomedullary junction without mass effect. 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 The patient did not  receive tPA. Echocardiogram with ejection fraction of 60% and no  regional wall motion abnormalities. Carotid Doppler showed no ICA  stenosis. Neurology Service was consulted, placed on Plavix therapy as  well as aspirin for CVA prophylaxis and subcutaneous Lovenox for DVT  prophylaxis.  Sustained T11 compression fracture in August. Wearing TLSO. Pain has improved. Imaging studies showed a compression of the superior aspect of T. 11 vertebral body no evidence of burst fracture. No evidence of displacement. HPI Completed home health therapy. Ambulating without assisted device in the home except when he gets up at night. Also uses a walker when he goes outside the home. No falls Continues to wear her TLSO Pain Inventory Average Pain 2 Pain Right Now 3 My pain is dull and aching  In the last 24 hours, has pain interfered with the following? General activity 5 Relation with others 2 Enjoyment of life 3 What TIME of day is your pain at its worst? evening Sleep (in general) Good  Pain is worse with: some activites Pain improves with: rest and medication Relief from Meds: 3  Mobility walk without assistance use a walker how many minutes can you walk? 10 ability to climb steps?  yes do you drive?  no  Function retired  Neuro/Psych bladder control problems confusion depression anxiety  Prior Studies Any changes since last visit?  no  Physicians involved in your care Any changes since last visit?  no   Family History  Problem Relation Age of Onset  . Heart disease Mother     MI  . Hypertension Mother     . Stroke Mother     ?  . Stroke Father   . Diabetes Sister   . Hypertension Sister   . Hypertension Sister   . Hypertension Sister   . Stroke Sister     recurrent mini strokes  . Cancer Neg Hx    History   Social History  . Marital Status: Single    Spouse Name: N/A    Number of Children: N/A  . Years of Education: N/A   Occupational History  . [employer] W[employer]/Retired     Parttime delivers flowers   Social History Main Topics  . Smoking status: Former Games developer  . Smokeless tobacco: Former Neurosurgeon     Comment: smoked minimal as teen  . Alcohol Use: No  . Drug Use: No  . Sexually Active: No   Other Topics Concern  . None   Social History Narrative   Caffeine: 2-3 cups/day, 1 gallon iced teaLives alone.  Widower.  2 dogs at home.Lots of driving.Activity: walking - 74min-1hr dailyDiet: some water, vegetables daily, occasional fruits   Past Surgical History  Procedure Date  . Cholecystectomy 05/1995  . Exercise treadmill 09/12/2006    NML  . Inguinal hernia repair 08/14/2010    Dr. Daphine Deutscher  . Esophagogastroduodenoscopy 08/18/2004    H. H. Dr. Russella Dar  . Colonoscopy 05/01/2007    divericulosis, int hemorrhoids - Dr. Russella Dar  . Dexa 05/2012    T score -2.8 hip, T11 compression fx   Past Medical History  Diagnosis Date  . GERD (gastroesophageal reflux disease) 08/1996  . Hyperlipemia 08/1986  . Hypertension Pre 1996  . Osteoporosis 12/2004    dexa T -2.8 (05/2012), T11 compression fx  . Pneumonia 22 yoa  . History of Doppler echocardiogram 05/1995    MVP, MOD AI, MILD TR, MILD MR  . Elevated PSA     decided against further eval  . Diverticulosis   . Bilateral sensorineural hearing loss 02/2012    some conductive left side  . Compression fracture     multiple  . AV block, 2nd degree 04/2012   BP 146/77  Pulse 72  Ht 5\' 11"  (1.803 m)  Wt 161 lb (73.029 kg)  BMI 22.45 kg/m2  SpO2 95%    Review of Systems  Musculoskeletal: Positive for back pain.   Psychiatric/Behavioral: Positive for confusion and dysphoric mood. The patient is nervous/anxious.   All other systems reviewed and are negative.   No bowel or bladder dysfunction    Objective:   Physical Exam  Lower body strength is 5/5 in the hip flexors knee extensors ankle dorsiflexors and plantar flexors  Upper body strength is 5/5 in the deltoid, biceps, triceps, grip  Sensation is intact to light touch in upper and lower extremities  No evidence of facial droop Ambulates without assisted device      Assessment & Plan:   1. CVA left pontomedullary junction no residual weakness  2. T11 compression fracture without neurologic compromise. Continue TLSO for one more month then will x-ray out of TLSO and possibly discontinue TLSO at that time   Will ask patient to followup with neurosurgery on this

## 2012-06-27 NOTE — Patient Instructions (Signed)
Please call Dr. Jeral Fruit for an appointment to see if you can discontinue your back brace You had a T11 compression fracture on 04/02/2012. You do not need to make an appointment to see me again

## 2012-06-27 NOTE — Telephone Encounter (Signed)
Pt saw Dr Doroteo Bradford today and question of whether pt should continue wearing back brace came up. Pt was advised to contact Dr Jeral Fruit about brace. Dr Cassandria Santee office request Dr Sharen Hones records since pt admitted 04/02/12. pts daughter asked pt while on phone about requesting records and daughter said pt does want records faxed to Dr Jeral Fruit. Fax # [phone] [place]. Records sent as requested.

## 2012-07-03 ENCOUNTER — Other Ambulatory Visit: Payer: Self-pay | Admitting: Family Medicine

## 2012-07-07 ENCOUNTER — Other Ambulatory Visit: Payer: Self-pay | Admitting: Family Medicine

## 2012-07-14 ENCOUNTER — Ambulatory Visit (INDEPENDENT_AMBULATORY_CARE_PROVIDER_SITE_OTHER): Payer: Medicare Other | Admitting: Internal Medicine

## 2012-07-14 ENCOUNTER — Encounter: Payer: Self-pay | Admitting: Internal Medicine

## 2012-07-14 VITALS — BP 140/82 | HR 81 | Ht 71.0 in | Wt 158.0 lb

## 2012-07-14 DIAGNOSIS — I441 Atrioventricular block, second degree: Secondary | ICD-10-CM

## 2012-07-14 DIAGNOSIS — I10 Essential (primary) hypertension: Secondary | ICD-10-CM

## 2012-07-14 DIAGNOSIS — R002 Palpitations: Secondary | ICD-10-CM

## 2012-07-14 NOTE — Patient Instructions (Addendum)
Your physician recommends that you schedule a follow-up appointment in: 4 months with Dr Allred  

## 2012-07-16 ENCOUNTER — Encounter: Payer: Self-pay | Admitting: Internal Medicine

## 2012-07-16 NOTE — Assessment & Plan Note (Addendum)
Asymptomatic, without any presyncope or syncope Refer to my initial consult note for full details. Given his advanced age and comorbidities, I would favor conservative management going forward.  The patient is clear that he would like to avoid PPM also. He will contact my office if he has presyncope, syncope, or any abrupt changes in his energy/ exercise tolerance.  Avoid AV nodal agents going forward.

## 2012-07-16 NOTE — Assessment & Plan Note (Signed)
His palpitations have resolved.  He does not wish to wear an event monitor at this time. Given his advanced age and resolution of symptoms, we will follow conservatively.  Should his palpitations return, he knows that he may contact my office to have an event monitor placed at that time.

## 2012-07-16 NOTE — Progress Notes (Signed)
PCP:  Christopher Boyden, MD  The patient presents today for routine electrophysiology followup. He was seen by me during his recent hospitalization for CVA (refer to my consult note).  He is a very elderly man with recent stroke.  During his hospitalization, he was observed to have second degree AV block with occasional 2:1 AV block for which he was not symptomatic.  He did not have markedly prolonged RR intervals or consecutive P waves not conducted and therefore conservative management was advised. He recently saw Dr Sharen Hones at which time he reported short episodes of tachypalpitations.  These episodes have resolved since that time.  The patient reports that he continues to make improvements since his hospital discharge.  He feels that he is slowly progressing back to his baseline.  Today, he denies symptoms of chest pain, shortness of breath, orthopnea, PND, lower extremity edema, dizziness, presyncope, syncope, or new neurologic sequela.  The patient feels that he is tolerating medications without difficulties and is otherwise without complaint today.   Past Medical History  Diagnosis Date  . GERD (gastroesophageal reflux disease) 08/1996  . Hyperlipemia 08/1986  . Hypertension Pre 1996  . Osteoporosis 12/2004    dexa T -2.8 (05/2012), T11 compression fx  . Pneumonia 22 yoa  . History of Doppler echocardiogram 05/1995    MVP, MOD AI, MILD TR, MILD MR  . Elevated PSA     decided against further eval  . Diverticulosis   . Bilateral sensorineural hearing loss 02/2012    some conductive left side  . Compression fracture     multiple  . AV block, 2nd degree 04/2012   Past Surgical History  Procedure Date  . Cholecystectomy 05/1995  . Exercise treadmill 09/12/2006    NML  . Inguinal hernia repair 08/14/2010    Dr. Daphine Deutscher  . Esophagogastroduodenoscopy 08/18/2004    H. H. Dr. Russella Dar  . Colonoscopy 05/01/2007    divericulosis, int hemorrhoids - Dr. Russella Dar  . Dexa 05/2012    T score  -2.8 hip, T11 compression fx    Current Outpatient Prescriptions  Medication Sig Dispense Refill  . acetaminophen (TYLENOL) 500 MG tablet Take 1,000 mg by mouth every 6 (six) hours as needed. For pain      . alendronate (FOSAMAX) 70 MG tablet Take 1 tablet (70 mg total) by mouth every 7 (seven) days. Take with a full glass of water on an empty stomach.  3 tablet  4  . Cholecalciferol (VITAMIN D) 1000 UNITS capsule Take 1,000 Units by mouth every evening.       . clopidogrel (PLAVIX) 75 MG tablet TAKE 1 TABLET BY MOUTH EVERY DAY WITH BREAKFAST  30 tablet  6  . Flaxseed, Linseed, (FLAX SEED OIL) 1000 MG CAPS Take 1 capsule by mouth every evening.       . hydrochlorothiazide (MICROZIDE) 12.5 MG capsule TAKE ONE CAPSULE BY MOUTH EVERY DAY  30 capsule  6  . lisinopril (PRINIVIL,ZESTRIL) 5 MG tablet TAKE 1 TABLET BY MOUTH EVERY DAY  30 tablet  6  . loratadine (CLARITIN) 10 MG tablet Take 10 mg by mouth every evening.       . Multiple Vitamin (MULTIVITAMIN WITH MINERALS) TABS Take 1 tablet by mouth daily.      Marland Kitchen NEXIUM 40 MG capsule TAKE ONE CAPSULE BY MOUTH EVERY DAY  30 capsule  11  . potassium chloride SA (K-DUR,KLOR-CON) 20 MEQ tablet Take 1 tablet (20 mEq total) by mouth daily.  30 tablet  1  . sertraline (ZOLOFT) 25 MG tablet Take 1 tablet (25 mg total) by mouth daily.  30 tablet  6  . traMADol (ULTRAM) 50 MG tablet Take 2 tablets (100 mg total) by mouth at bedtime.  60 tablet  1    No Known Allergies  History   Social History  . Marital Status: Single    Spouse Name: N/A    Number of Children: N/A  . Years of Education: N/A   Occupational History  . [employer]/Retired     Parttime delivers flowers   Social History Main Topics  . Smoking status: Former Games developer  . Smokeless tobacco: Former Neurosurgeon     Comment: smoked minimal as teen  . Alcohol Use: No  . Drug Use: No  . Sexually Active: No   Other Topics Concern  . Not on file   Social History Narrative   Caffeine: 2-3  cups/day, 1 gallon iced teaLives alone.  Widower.  2 dogs at home.Lots of driving.Activity: walking - 28min-1hr dailyDiet: some water, vegetables daily, occasional fruits    Family History  Problem Relation Age of Onset  . Heart disease Mother     MI  . Hypertension Mother   . Stroke Mother     ?  . Stroke Father   . Diabetes Sister   . Hypertension Sister   . Hypertension Sister   . Hypertension Sister   . Stroke Sister     recurrent mini strokes  . Cancer Neg Hx     Physical Exam: Filed Vitals:   07/14/12 1553  BP: 140/82  Pulse: 81  Height: 5\' 11"  (1.803 m)  Weight: 158 lb (71.668 kg)  SpO2: 96%    GEN- The patient is elderly appearing, alert and oriented x 3 today.   Head- normocephalic, atraumatic Eyes-  Sclera clear, conjunctiva pink Ears- hearing intact Oropharynx- clear Neck- supple  Lungs- Clear to ausculation bilaterally, normal work of breathing Heart- Regular rate and rhythm, no murmurs, rubs or gallops, PMI not laterally displaced GI- soft, NT, ND, + BS Extremities- no clubbing, cyanosis, or edema MS- diffuse muscle atrophy Skin- no rash or lesion Psych- euthymic mood, full affect Neuro- strength and sensation are intact ekg today reveals sinus rhythm with first degree AV block (PR ), otherwise normal ekg  Assessment and Plan:

## 2012-07-16 NOTE — Assessment & Plan Note (Signed)
Stable No change required today  

## 2012-07-31 ENCOUNTER — Other Ambulatory Visit: Payer: Self-pay | Admitting: *Deleted

## 2012-07-31 ENCOUNTER — Other Ambulatory Visit: Payer: Self-pay | Admitting: Family Medicine

## 2012-08-01 ENCOUNTER — Other Ambulatory Visit: Payer: Self-pay | Admitting: Family Medicine

## 2012-08-09 DIAGNOSIS — C4491 Basal cell carcinoma of skin, unspecified: Secondary | ICD-10-CM

## 2012-08-09 HISTORY — DX: Basal cell carcinoma of skin, unspecified: C44.91

## 2012-08-10 ENCOUNTER — Encounter: Payer: Self-pay | Admitting: *Deleted

## 2012-08-10 NOTE — Telephone Encounter (Signed)
This encounter was created in error - please disregard.

## 2012-08-13 ENCOUNTER — Other Ambulatory Visit: Payer: Self-pay | Admitting: Family Medicine

## 2012-08-22 ENCOUNTER — Encounter: Payer: Self-pay | Admitting: Family Medicine

## 2012-08-22 ENCOUNTER — Ambulatory Visit (INDEPENDENT_AMBULATORY_CARE_PROVIDER_SITE_OTHER): Payer: Medicare Other | Admitting: Family Medicine

## 2012-08-22 VITALS — BP 148/98 | HR 64 | Temp 97.8°F | Wt 159.2 lb

## 2012-08-22 DIAGNOSIS — I10 Essential (primary) hypertension: Secondary | ICD-10-CM

## 2012-08-22 DIAGNOSIS — C44201 Unspecified malignant neoplasm of skin of unspecified ear and external auricular canal: Secondary | ICD-10-CM

## 2012-08-22 DIAGNOSIS — R002 Palpitations: Secondary | ICD-10-CM

## 2012-08-22 DIAGNOSIS — I6350 Cerebral infarction due to unspecified occlusion or stenosis of unspecified cerebral artery: Secondary | ICD-10-CM

## 2012-08-22 DIAGNOSIS — I639 Cerebral infarction, unspecified: Secondary | ICD-10-CM

## 2012-08-22 DIAGNOSIS — M810 Age-related osteoporosis without current pathological fracture: Secondary | ICD-10-CM

## 2012-08-22 DIAGNOSIS — R1312 Dysphagia, oropharyngeal phase: Secondary | ICD-10-CM

## 2012-08-22 DIAGNOSIS — F329 Major depressive disorder, single episode, unspecified: Secondary | ICD-10-CM

## 2012-08-22 DIAGNOSIS — D0420 Carcinoma in situ of skin of unspecified ear and external auricular canal: Secondary | ICD-10-CM

## 2012-08-22 DIAGNOSIS — IMO0002 Reserved for concepts with insufficient information to code with codable children: Secondary | ICD-10-CM

## 2012-08-22 DIAGNOSIS — T148XXA Other injury of unspecified body region, initial encounter: Secondary | ICD-10-CM

## 2012-08-22 NOTE — Progress Notes (Signed)
Subjective:    Patient ID: Christopher [name], male    DOB: 10/25/1923, 79 y.o.   MRN: [id number]  HPI CC: 65mo f/u  Presents with son and daughter.  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 Stroke presented with R sided weakness and slurred speech.   Christopher [name] presents today as a 3 month follow up.  Notes some persistent trouble swallowing going on since stroke.  Chewing fine.  Sometimes feels like needs to swallow when doesn't have any food in mouth.  Denies choking, no aspiration sxs.    HH has stopped coming out to house.  Son comes twice daily to check on pt.  Has mobile meals.  Also able to make his own breakfast and supper.  Since last seen by myself, saw and was released from Dr. Wynn Banker ([facility]), Dr. Jeral Fruit cleared discontinuation of back brace for h/o T11 vertebral compression fracture, and was seen by Dr. Johney Frame for 2nd degree AV block who recommended continuing to monitor, but to notify office if recurrent palpitations, syncope or presyncope.  Pt did desire to avoid PPM.  States vertebral compression fraction with only mild residual pain - much improved.  Saw neurology 07/2012, ASA stopped.  Continues only on plavix s/p stroke  At our last visit we were also jointly concerned about developing depression after activity limiting compression fracture and stroke so sertraline at 25mg  daily was started.  Feels this medicine has significantly helped, desires to continue at current dose.  HTN - compliant with meds, noted elevated blood pressure today.  Last several have been borderline.  Has not taken bp medicine this morning yet. BP Readings from Last 3 Encounters:  08/22/12 148/98  07/14/12 140/82  06/27/12 146/77     Osteoporosis - Bone density scan 05/2012 with T score of -2.8.  Compliant with fosamax and vit D.  Drinking plenty of milk.  Drinking ensure as well.  Ear biopsy done last week - has seen Dr. Terri [name].  H/o basal cell skin cancer in past, concerned with recurrence.  Told would need repeat surgery.  Past Medical History  Diagnosis Date  . GERD (gastroesophageal reflux disease) 08/1996  . Hyperlipemia 08/1986  . Hypertension Pre 1996  . Osteoporosis 12/2004    dexa T -2.8 (05/2012), T11 compression fx  . Pneumonia 22 yoa  . History of Doppler echocardiogram 05/1995    MVP, MOD AI, MILD TR, MILD MR  . Elevated PSA     decided against further eval  . Diverticulosis   . Bilateral sensorineural hearing loss 02/2012    some conductive left side  . Compression fracture     multiple  . AV block, 2nd degree 04/2012    seen cards, monitor for now, pt does not desire monitor/pacer   Past Surgical History  Procedure Date  . Cholecystectomy 05/1995  . Exercise treadmill 09/12/2006    NML  . Inguinal hernia repair 08/14/2010    Dr. Daphine [name]  . Esophagogastroduodenoscopy 08/18/2004    H. H. Dr. Russella [name]  . Colonoscopy 05/01/2007    divericulosis, int hemorrhoids - Dr. Russella [name]  . Dexa 05/2012    T score -2.8 hip, T11 compression fx    Review of Systems Per HPI    Objective:   Physical Exam  Nursing note  and vitals reviewed. Constitutional: He appears well-developed and well-nourished. No distress.  HENT:  Mouth/Throat: Uvula is midline, oropharynx is clear and moist and mucous membranes are normal. No oropharyngeal exudate, posterior oropharyngeal edema, posterior oropharyngeal erythema or tonsillar abscesses.       Bandage on left earlobe  Cardiovascular: Normal rate, regular rhythm and intact distal pulses.   Murmur (2/6 SEM) heard. Pulmonary/Chest: Effort normal and breath sounds normal. No respiratory distress. He has no wheezes. He has no rales.  Musculoskeletal: He exhibits edema (tr  edema).       Elongated thick nails  Skin: Skin is warm and dry. No rash noted.  Psychiatric: He has a normal mood and affect.       Speech intact Pleasant, conversant, full affect       Assessment & Plan:

## 2012-08-22 NOTE — Patient Instructions (Signed)
Good to see you today, call us with questions. Return as needed or in 6 months for follow up. Pass by Marion's office for referral to evaluate difficulty swallowing.

## 2012-08-23 ENCOUNTER — Encounter: Payer: Self-pay | Admitting: Family Medicine

## 2012-08-23 DIAGNOSIS — C44201 Unspecified malignant neoplasm of skin of unspecified ear and external auricular canal: Secondary | ICD-10-CM | POA: Insufficient documentation

## 2012-08-23 DIAGNOSIS — R1312 Dysphagia, oropharyngeal phase: Secondary | ICD-10-CM | POA: Insufficient documentation

## 2012-08-23 NOTE — Assessment & Plan Note (Signed)
Endorses oropharyngeal dysphagia to liquids but not solids. Likely sequela of stroke. Given some concern with possible aspiration - will obtain MBS to dictate management. Was transitioned to regular diet prior to discharge from comprehensive inpatient rehab.

## 2012-08-23 NOTE — Assessment & Plan Note (Signed)
Has been cleared by rehab physician, off thoracic brace as well per neurosurgery. Pain manageable.

## 2012-08-23 NOTE — Assessment & Plan Note (Signed)
Pt has chosen conservative management, currently remains asxs

## 2012-08-23 NOTE — Assessment & Plan Note (Signed)
Has completed rehab. See above for dysphagia plan.

## 2012-08-23 NOTE — Assessment & Plan Note (Signed)
Follows with Dr. Terri Piedra. Pending biopsy results.

## 2012-08-23 NOTE — Assessment & Plan Note (Signed)
Compliant with cal/vit D and fosamax.

## 2012-08-23 NOTE — Assessment & Plan Note (Signed)
Improved on sertraline 25mg  daily.  Will continue med.

## 2012-08-23 NOTE — Assessment & Plan Note (Signed)
Elevated today - has not taken am meds yet. BP Readings from Last 3 Encounters:  08/22/12 148/98  07/14/12 140/82  06/27/12 146/77

## 2012-08-24 ENCOUNTER — Encounter: Payer: Self-pay | Admitting: Family Medicine

## 2012-08-24 ENCOUNTER — Ambulatory Visit (HOSPITAL_COMMUNITY)
Admission: RE | Admit: 2012-08-24 | Discharge: 2012-08-24 | Disposition: A | Discharge status: Home / Self Care | Payer: Medicare Other | Source: Ambulatory Visit | Attending: Family Medicine | Admitting: Family Medicine

## 2012-08-24 DIAGNOSIS — R1312 Dysphagia, oropharyngeal phase: Secondary | ICD-10-CM

## 2012-08-24 DIAGNOSIS — I10 Essential (primary) hypertension: Secondary | ICD-10-CM | POA: Insufficient documentation

## 2012-08-24 DIAGNOSIS — K219 Gastro-esophageal reflux disease without esophagitis: Secondary | ICD-10-CM | POA: Insufficient documentation

## 2012-08-24 DIAGNOSIS — R1310 Dysphagia, unspecified: Secondary | ICD-10-CM | POA: Insufficient documentation

## 2012-08-24 NOTE — Procedures (Signed)
Objective Swallowing Evaluation: Modified Barium Swallowing Study  Patient Details  Name: Christopher [name] MRN: [id number] Date of Birth: 03-27-1924  Today's Date: 08/24/2012 Time: 4098-1191 SLP Time Calculation (min): 30 min  Past Medical History:  Past Medical History  Diagnosis Date  . GERD (gastroesophageal reflux disease) 08/1996  . Hyperlipemia 08/1986  . Hypertension Pre 1996  . Osteoporosis 12/2004    dexa T -2.8 (05/2012), T11 compression fx  . Pneumonia 22 yoa  . History of Doppler echocardiogram 05/1995    MVP, MOD AI, MILD TR, MILD MR  . Elevated PSA     decided against further eval  . Diverticulosis   . Bilateral sensorineural hearing loss 02/2012    some conductive left side  . Compression fracture     multiple  . AV block, 2nd degree 04/2012    seen cards, monitor for now, pt does not desire monitor/pacer   Past Surgical History:  Past Surgical History  Procedure Date  . Cholecystectomy 05/1995  . Exercise treadmill 09/12/2006    NML  . Inguinal hernia repair 08/14/2010    Dr. Daphine Deutscher  . Esophagogastroduodenoscopy 08/18/2004    [facility]. H. Dr. Russella Dar  . Colonoscopy 05/01/2007    divericulosis, int hemorrhoids - Dr. Russella Dar  . 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 Stroke presented with R sided weakness and slurred speech  Notes some persistent trouble swallowing going on since stroke.  Chewing fine.  Sometimes feels like needs to swallow when doesn't have any food in mouth.  Pt was followed by SLP services when hospitalized for CVA in 9/13.  Initial MBS upon admission revealed a moderate pharyngeal dysphagia.  Pt consumed conservative POs with  thickened liquids initially given findings of aspiration; after progression through therapy in CIR, pt was advanced to a mechanical soft diet with thin liquids upon D/C.       Assessment / Plan / Recommendation Clinical Impression  Dysphagia Diagnosis: Within Functional Limits Clinical impression: Pt presents with much improved swallow function since his D/C from CIR September of 2013.  Oral mastication is normal;  there is a mild swallow delay to level of valleculae, not considered disordered in the elderly.  When consuming a 13 mm barium pill in conjunction with thin liquids, the liquids were immediately aspirated.  Also notable was a CP bar, which does not impede function at this time, as well as a probable osteophyte at C5-6 (not confirmed by radiologist).  While neither structural deficit interferes with function, both were brought to the attention of pt/daughter.   Recommend continuing regular foods with thin liquids; meds whole with puree to prevent aspiration.  Pt agrees with results/recs.  No f/u recommended.    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 Stroke presented with R sided weakness and slurred speech  Notes some persistent trouble swallowing going on since stroke.  Chewing fine.  Sometimes feels like  needs to swallow when doesn't have any food in mouth.  Pt was followed by SLP services when hospitalized for CVA in 9/13.  Initial MBS upon admission revealed a moderate pharyngeal dysphagia.  Pt consumed conservative POs with thickened liquids initially given findings of aspiration; after progression through therapy in CIR, pt was advanced to a mechanical soft diet with thin liquids upon D/C.   Type of Study: Modified Barium Swallowing Study Reason for Referral: Objectively evaluate swallowing function Previous Swallow Assessment: mbs 04/16/12: Moderate sensory-motor oral dysphagia mainly due to decreased lingual strength, sensation ,and coordination.  Difficulty initiating lingual movement with subsequent tongue pumping noted with all PO trials preventing bolus leaving oral cavity.     Moderate sensory paired with mechanical pharyngeal dysphagia. TBR decreased but when finally initiated large amount of thin liquid barium dumped in pharynx.   Significant aspiraton with good cough response before the swallow occurred with thin liquid by spoon due to decreased sensation and decreased laryngeal closure.   Sluggish velopharyngeal lmovement noted.  C-6  appeared to have large osteophyte anteriorly encroaching posterior oropharynx and esophagus slowing bolus transit.( no radiologist present to confirm)   Backflow noted with nectar thick liquids after the swallow.  Recommend to proceed with conservative diet of dysphagia 1 (puree) and Nectar thick liquids due to noted decreased sensory in pharyngeal phase decreasing airway closure.  Recommend full supervision with all meals as aspiration risk remains.  ST to follow for diet tolerance and possible diet upgrade. Recommend repeat MBS in 3 days with clinical improvement.  Recommend CIR consult.  Diet Prior to this Study: Regular;Thin liquids Temperature Spikes Noted: No Respiratory Status: Room air History of Recent Intubation: No Behavior/Cognition:  Alert;Cooperative;Pleasant mood Oral Cavity - Dentition: Missing dentition Oral Motor / Sensory Function: Impaired motor Oral impairment:  (CN VII right; vagus right) Self-Feeding Abilities: Able to feed self Patient Positioning: Upright in chair Baseline Vocal Quality: Clear Volitional Cough: Strong Volitional Swallow: Able to elicit Anatomy: Other (Comment) (appearance of osteophyte C5-6) Pharyngeal Secretions: Not observed secondary MBS    Reason for Referral Objectively evaluate swallowing function   Oral Phase Oral Preparation/Oral Phase Oral Phase: WFL   Pharyngeal Phase Pharyngeal Phase Pharyngeal Phase: Impaired Pharyngeal - Thin Pharyngeal - Thin Cup: Penetration/Aspiration during swallow (in conjunction with pill) Penetration/Aspiration details (thin cup): Material enters airway, passes BELOW cords and not ejected out despite cough attempt by patient  Cervical Esophageal Phase    GO    Cervical Esophageal Phase Cervical Esophageal Phase: Impaired    Functional Assessment Tool Used: clinical judgement Functional Limitations: Swallowing Swallow Current Status (Z6109): At least 1 percent but less than 20 percent impaired, limited or restricted Swallow Goal Status [phone]): At least 1 percent but less than 20 percent impaired, limited or restricted Swallow Discharge Status [phone]): At least 1 percent but less than 20 percent impaired, limited or restricted   Lliam Hoh L. Samson Frederic, [place] CCC/SLP Pager [phone]  Blenda Mounts Laurice 08/24/2012, 12:15 PM

## 2012-09-01 ENCOUNTER — Encounter: Payer: Self-pay | Admitting: Family Medicine

## 2012-09-08 ENCOUNTER — Other Ambulatory Visit: Payer: Self-pay | Admitting: Family Medicine

## 2012-10-11 ENCOUNTER — Other Ambulatory Visit: Payer: Self-pay | Admitting: Family Medicine

## 2012-10-11 NOTE — Telephone Encounter (Signed)
Ok to refill 

## 2012-10-17 ENCOUNTER — Encounter (HOSPITAL_BASED_OUTPATIENT_CLINIC_OR_DEPARTMENT_OTHER): Payer: Self-pay | Admitting: *Deleted

## 2012-10-17 NOTE — Progress Notes (Signed)
Reviewed notes with dr frederick-ok for here

## 2012-10-17 NOTE — Progress Notes (Signed)
Pt had cva 9/13-sees neurology-dr sanger to get permission to stop plavix-so fixes meds-he will ck on that-gave son and daughter all preop instructions-pt HOH- Pt lives alone, but children ck on him 2-3 x daily

## 2012-10-18 ENCOUNTER — Other Ambulatory Visit: Payer: Self-pay | Admitting: Plastic Surgery

## 2012-10-18 DIAGNOSIS — C44219 Basal cell carcinoma of skin of left ear and external auricular canal: Secondary | ICD-10-CM

## 2012-10-18 NOTE — H&P (Signed)
   This document contains confidential information from a [facility] medical record system and may be unauthenticated. Release may be made only with a valid authorization or in accordance with applicable policies of Medical Center or its affiliates. This document must be maintained in a secure manner or discarded/destroyed as required by Medical Center policy or by a confidential means such as shredding.     Connelly [place]ter[place]  10/17/2012 8:45 AM   Initial consult  MRN:  [id number]  Department:  Plastic Surgery  Dept Phone: [phone]  Description: Male DOB: 06-06-1924  Provider: Wayland Denis, DO    Diagnoses -  Basal cell carcinoma of tragus, left    -  Primary   173.21    Mohs defect of auricle, left       380.32     Reason for Visit -  Skin Problem   Vitals - Last Recorded   146/89  53  1.803 m (5\' 11" )  74.844 kg (165 lb)  23.02 kg/m2       Subjective:    Patient ID: Christopher [name] is a 79 y.o. male.  HPI The patient is an 79 yrs old wm here with his daughter for evaluation of a left ear defect.  He had a growth on the ear that was excised 4 days ago and found to be a basal cell carcinoma.  The defect includes the tragus, skin of the antitragus and lower portion of the concha and superior portion of the lobule.  The area is ~ 2.5 x 2.0 cm. The margins are clear.  He has multiple other medical conditions including hearing difficulty, heart murmur, hypertension, bladder problems and a stroke 04/2012.  He is taking plavix for the stroke. He does not have any sign of infection and no other concerning lesions in the immediate area.  Lymph nodes negative.  The following portions of the patient's history were reviewed and updated as appropriate: allergies, current medications, past family history, past medical history, past social history, past surgical history and problem list.  Review of Systems  Constitutional: Negative.   HENT: Negative.   Eyes: Negative.   Respiratory: Negative.    Cardiovascular: Negative.   Gastrointestinal: Negative.   Endocrine: Negative.   Genitourinary: Negative.   Allergic/Immunologic: Negative.   Neurological: Negative.   Hematological: Bruises/bleeds easily.      Objective:    Physical Exam  Constitutional: He appears well-developed and well-nourished.  HENT:   Head: Normocephalic and atraumatic.  Ears:  Eyes: Conjunctivae and EOM are normal. Pupils are equal, round, and reactive to light.  Neck: Normal range of motion.  Cardiovascular: Normal rate.   Pulmonary/Chest: Effort normal.  Abdominal: Soft.  Musculoskeletal: Normal range of motion.  Neurological: He is alert.  Skin: Skin is warm.  Psychiatric: He has a normal mood and affect. His behavior is normal. Judgment and thought content normal.    Assessment:      1.  Basal cell carcinoma of tragus, left    2.  Mohs defect of auricle, left     Plan:     Recommend repair of the defect with skin graft and tissue re-arrangement.  Possible cartilage graft to recreate the tragus. Risks and complications were reviewed and include bleeding, pain, scar and risk of anesthesia.have left a message with Dr. Pearlean Brownie at [facility] neurology to discuss plavix [phone]

## 2012-10-19 ENCOUNTER — Ambulatory Visit (HOSPITAL_BASED_OUTPATIENT_CLINIC_OR_DEPARTMENT_OTHER): Payer: Medicare Other | Admitting: Anesthesiology

## 2012-10-19 ENCOUNTER — Ambulatory Visit (HOSPITAL_BASED_OUTPATIENT_CLINIC_OR_DEPARTMENT_OTHER)
Admission: RE | Admit: 2012-10-19 | Discharge: 2012-10-19 | Disposition: A | Discharge status: Home / Self Care | Payer: Medicare Other | Source: Ambulatory Visit | Attending: Plastic Surgery | Admitting: Plastic Surgery

## 2012-10-19 ENCOUNTER — Encounter (HOSPITAL_BASED_OUTPATIENT_CLINIC_OR_DEPARTMENT_OTHER): Payer: Self-pay

## 2012-10-19 ENCOUNTER — Encounter (HOSPITAL_BASED_OUTPATIENT_CLINIC_OR_DEPARTMENT_OTHER)
Admission: RE | Disposition: A | Discharge status: Home / Self Care | Payer: Self-pay | Source: Ambulatory Visit | Attending: Plastic Surgery

## 2012-10-19 ENCOUNTER — Encounter (HOSPITAL_BASED_OUTPATIENT_CLINIC_OR_DEPARTMENT_OTHER): Payer: Self-pay | Admitting: Anesthesiology

## 2012-10-19 DIAGNOSIS — H61119 Acquired deformity of pinna, unspecified ear: Secondary | ICD-10-CM | POA: Diagnosis present

## 2012-10-19 DIAGNOSIS — I10 Essential (primary) hypertension: Secondary | ICD-10-CM | POA: Insufficient documentation

## 2012-10-19 DIAGNOSIS — H9190 Unspecified hearing loss, unspecified ear: Secondary | ICD-10-CM | POA: Insufficient documentation

## 2012-10-19 DIAGNOSIS — N329 Bladder disorder, unspecified: Secondary | ICD-10-CM | POA: Insufficient documentation

## 2012-10-19 DIAGNOSIS — R011 Cardiac murmur, unspecified: Secondary | ICD-10-CM | POA: Insufficient documentation

## 2012-10-19 DIAGNOSIS — Z8673 Personal history of transient ischemic attack (TIA), and cerebral infarction without residual deficits: Secondary | ICD-10-CM | POA: Insufficient documentation

## 2012-10-19 DIAGNOSIS — Z481 Encounter for planned postprocedural wound closure: Secondary | ICD-10-CM | POA: Insufficient documentation

## 2012-10-19 DIAGNOSIS — Z7902 Long term (current) use of antithrombotics/antiplatelets: Secondary | ICD-10-CM | POA: Insufficient documentation

## 2012-10-19 DIAGNOSIS — C44211 Basal cell carcinoma of skin of unspecified ear and external auricular canal: Secondary | ICD-10-CM | POA: Insufficient documentation

## 2012-10-19 DIAGNOSIS — I499 Cardiac arrhythmia, unspecified: Secondary | ICD-10-CM | POA: Insufficient documentation

## 2012-10-19 DIAGNOSIS — C44219 Basal cell carcinoma of skin of left ear and external auricular canal: Secondary | ICD-10-CM

## 2012-10-19 DIAGNOSIS — K219 Gastro-esophageal reflux disease without esophagitis: Secondary | ICD-10-CM | POA: Insufficient documentation

## 2012-10-19 DIAGNOSIS — Z8701 Personal history of pneumonia (recurrent): Secondary | ICD-10-CM | POA: Insufficient documentation

## 2012-10-19 HISTORY — DX: Unspecified hearing loss, unspecified ear: H91.90

## 2012-10-19 HISTORY — DX: Chronic kidney disease, unspecified: N18.9

## 2012-10-19 HISTORY — DX: Depression, unspecified: F32.A

## 2012-10-19 HISTORY — PX: SKIN FULL THICKNESS GRAFT: SHX442

## 2012-10-19 HISTORY — DX: Major depressive disorder, single episode, unspecified: F32.9

## 2012-10-19 LAB — POCT I-STAT, CHEM 8
BUN: 20 mg/dL (ref 6–23)
Calcium, Ion: 1.16 mmol/L (ref 1.13–1.30)
Chloride: 107 mEq/L (ref 96–112)
Glucose, Bld: 103 mg/dL — ABNORMAL HIGH (ref 70–99)
TCO2: 27 mmol/L (ref 0–100)

## 2012-10-19 SURGERY — APPLICATION, GRAFT, SKIN, FULL-THICKNESS
Anesthesia: General | Site: Ear | Laterality: Left | Wound class: Clean Contaminated

## 2012-10-19 MED ORDER — GLYCOPYRROLATE 0.2 MG/ML IJ SOLN
INTRAMUSCULAR | Status: DC | PRN
  Administered 2012-10-19: 0.2 mg via INTRAVENOUS

## 2012-10-19 MED ORDER — ONDANSETRON HCL 4 MG/2ML IJ SOLN
INTRAMUSCULAR | Status: DC | PRN
  Administered 2012-10-19: 4 mg via INTRAVENOUS

## 2012-10-19 MED ORDER — PROPOFOL 10 MG/ML IV BOLUS
INTRAVENOUS | Status: DC | PRN
  Administered 2012-10-19: 100 mg via INTRAVENOUS

## 2012-10-19 MED ORDER — EPHEDRINE SULFATE 50 MG/ML IJ SOLN
INTRAMUSCULAR | Status: DC | PRN
  Administered 2012-10-19: 15 mg via INTRAVENOUS

## 2012-10-19 MED ORDER — LACTATED RINGERS IV SOLN
INTRAVENOUS | Status: DC
  Administered 2012-10-19 (×2): via INTRAVENOUS

## 2012-10-19 MED ORDER — SUCCINYLCHOLINE CHLORIDE 20 MG/ML IJ SOLN
INTRAMUSCULAR | Status: DC | PRN
  Administered 2012-10-19: 100 mg via INTRAVENOUS

## 2012-10-19 MED ORDER — CLOPIDOGREL BISULFATE 75 MG PO TABS: 75.0000 mg | ORAL_TABLET | Freq: Every day | ORAL | Status: DC

## 2012-10-19 MED ORDER — CEFAZOLIN SODIUM-DEXTROSE 2-3 GM-% IV SOLR
2.0000 g | INTRAVENOUS | Status: AC
  Administered 2012-10-19: 2 g via INTRAVENOUS

## 2012-10-19 MED ORDER — PHENYLEPHRINE HCL 10 MG/ML IJ SOLN
10.0000 mg | INTRAVENOUS | Status: DC | PRN
  Administered 2012-10-19: 40 ug/min via INTRAVENOUS

## 2012-10-19 MED ORDER — LIDOCAINE-EPINEPHRINE 1 %-1:100000 IJ SOLN
INTRAMUSCULAR | Status: DC | PRN
  Administered 2012-10-19: 7.5 mL

## 2012-10-19 MED ORDER — LIDOCAINE HCL (CARDIAC) 20 MG/ML IV SOLN
INTRAVENOUS | Status: DC | PRN
  Administered 2012-10-19: 40 mg via INTRAVENOUS

## 2012-10-19 MED ORDER — HYDROMORPHONE HCL PF 1 MG/ML IJ SOLN
0.2500 mg | INTRAMUSCULAR | Status: DC | PRN
Start: 2012-10-19 — End: 2012-10-19
  Administered 2012-10-19 (×2): 0.5 mg via INTRAVENOUS

## 2012-10-19 MED ORDER — HYDROCODONE-ACETAMINOPHEN 5-325 MG PO TABS
1.0000 | ORAL_TABLET | Freq: Four times a day (QID) | ORAL | Status: AC | PRN
  Administered 2012-10-19: 1 via ORAL

## 2012-10-19 SURGICAL SUPPLY — 61 items
APPLICATOR COTTON TIP 6IN STRL (MISCELLANEOUS) ×2 IMPLANT
BANDAGE CONFORM 2  STR LF (GAUZE/BANDAGES/DRESSINGS) IMPLANT
BLADE HEX COATED 2.75 (ELECTRODE) IMPLANT
BLADE SURG 15 STRL LF DISP TIS (BLADE) ×2 IMPLANT
BLADE SURG 15 STRL SS (BLADE) ×2
BLADE SURG ROTATE 9660 (MISCELLANEOUS) IMPLANT
BNDG ELASTIC 2 VLCR STRL LF (GAUZE/BANDAGES/DRESSINGS) IMPLANT
CANISTER SUCTION 1200CC (MISCELLANEOUS) ×2 IMPLANT
CLOTH BEACON ORANGE TIMEOUT ST (SAFETY) ×2 IMPLANT
CORDS BIPOLAR (ELECTRODE) IMPLANT
COTTONBALL LRG STERILE PKG (GAUZE/BANDAGES/DRESSINGS) ×2 IMPLANT
COVER MAYO STAND STRL (DRAPES) ×2 IMPLANT
COVER TABLE BACK 60X90 (DRAPES) ×2 IMPLANT
DERMABOND ADVANCED (GAUZE/BANDAGES/DRESSINGS) ×1
DERMABOND ADVANCED .7 DNX12 (GAUZE/BANDAGES/DRESSINGS) ×1 IMPLANT
DRAPE INCISE IOBAN 66X45 STRL (DRAPES) ×2 IMPLANT
DRAPE U-SHAPE 76X120 STRL (DRAPES) IMPLANT
ELECT NEEDLE BLADE 2-5/6 (NEEDLE) ×2 IMPLANT
ELECT REM PT RETURN 9FT ADLT (ELECTROSURGICAL) ×2
ELECT REM PT RETURN 9FT PED (ELECTROSURGICAL)
ELECTRODE REM PT RETRN 9FT PED (ELECTROSURGICAL) IMPLANT
ELECTRODE REM PT RTRN 9FT ADLT (ELECTROSURGICAL) ×1 IMPLANT
GAUZE SPONGE 4X4 12PLY STRL LF (GAUZE/BANDAGES/DRESSINGS) IMPLANT
GAUZE XEROFORM 1X8 LF (GAUZE/BANDAGES/DRESSINGS) IMPLANT
GAUZE XEROFORM 5X9 LF (GAUZE/BANDAGES/DRESSINGS) ×2 IMPLANT
GLOVE BIO SURGEON STRL SZ 6.5 (GLOVE) ×6 IMPLANT
GLOVE SKINSENSE NS SZ6.5 (GLOVE)
GLOVE SKINSENSE STRL SZ6.5 (GLOVE) IMPLANT
GOWN PREVENTION PLUS XLARGE (GOWN DISPOSABLE) ×6 IMPLANT
MATRIX SURGICAL PSMX 7X10CM (Tissue) ×2 IMPLANT
NEEDLE 27GAX1X1/2 (NEEDLE) ×2 IMPLANT
NEEDLE HYPO 30GX1 BEV (NEEDLE) IMPLANT
NS IRRIG 1000ML POUR BTL (IV SOLUTION) ×2 IMPLANT
PACK BASIN DAY SURGERY FS (CUSTOM PROCEDURE TRAY) ×2 IMPLANT
PENCIL BUTTON HOLSTER BLD 10FT (ELECTRODE) ×2 IMPLANT
SHEET MEDIUM DRAPE 40X70 STRL (DRAPES) IMPLANT
SPONGE GAUZE 2X2 8PLY STRL LF (GAUZE/BANDAGES/DRESSINGS) IMPLANT
SPONGE LAP 18X18 X RAY DECT (DISPOSABLE) IMPLANT
STOCKINETTE 6  STRL (DRAPES) ×1
STOCKINETTE 6 STRL (DRAPES) ×1 IMPLANT
SUCTION FRAZIER TIP 10 FR DISP (SUCTIONS) ×2 IMPLANT
SUT CHROMIC 4 0 P 3 18 (SUTURE) IMPLANT
SUT ETHILON 4 0 PS 2 18 (SUTURE) IMPLANT
SUT ETHILON 5 0 P 3 18 (SUTURE)
SUT MNCRL 6-0 UNDY P1 1X18 (SUTURE) ×2 IMPLANT
SUT MNCRL AB 4-0 PS2 18 (SUTURE) IMPLANT
SUT MON AB 5-0 P3 18 (SUTURE) ×6 IMPLANT
SUT MONOCRYL 6-0 P1 1X18 (SUTURE) ×2
SUT NYLON ETHILON 5-0 P-3 1X18 (SUTURE) IMPLANT
SUT PLAIN 5 0 P 3 18 (SUTURE) IMPLANT
SUT PROLENE 5 0 P 3 (SUTURE) IMPLANT
SUT PROLENE 6 0 P 1 18 (SUTURE) IMPLANT
SUT VIC AB 4-0 RB1 18 (SUTURE) ×2 IMPLANT
SUT VIC AB 5-0 P-3 18X BRD (SUTURE) IMPLANT
SUT VIC AB 5-0 P3 18 (SUTURE)
SUT VICRYL 4-0 PS2 18IN ABS (SUTURE) IMPLANT
SYR BULB 3OZ (MISCELLANEOUS) ×2 IMPLANT
SYR CONTROL 10ML LL (SYRINGE) ×2 IMPLANT
TRAY DSU PREP LF (CUSTOM PROCEDURE TRAY) ×2 IMPLANT
TUBE CONNECTING 20X1/4 (TUBING) ×2 IMPLANT
WATER STERILE IRR 1000ML POUR (IV SOLUTION) IMPLANT

## 2012-10-19 NOTE — Brief Op Note (Signed)
10/19/2012  11:26 AM  PATIENT:  Christopher [name]  79 y.o. male  PRE-OPERATIVE DIAGNOSIS:  basal cell carcinoma of left ear  POST-OPERATIVE DIAGNOSIS:  basal cell carcinoma of left ear  PROCEDURE:  Procedure(s): REPAIR OF LEFT EAR WITH TISSUE REARRANGEMENT SKIN GRAFT FULL THICKNESS (Left)  SURGEON:  Surgeon(s) and Role:    * Claire Sanger, DO - Primary  PHYSICIAN ASSISTANT: Shawn Rayburn, PA  ASSISTANTS: none   ANESTHESIA:   general  EBL:  Total I/O In: 1300 [I.V.:1300] Out: -   BLOOD ADMINISTERED:none  DRAINS: none   LOCAL MEDICATIONS USED:  LIDOCAINE   SPECIMEN:  No Specimen  DISPOSITION OF SPECIMEN:  N/A  COUNTS:  YES  TOURNIQUET:  * No tourniquets in log *  DICTATION: .Dragon Dictation  PLAN OF CARE: Discharge to home after PACU  PATIENT DISPOSITION:  PACU - hemodynamically stable.   Delay start of Pharmacological VTE agent (>24hrs) due to surgical blood loss or risk of bleeding: no

## 2012-10-19 NOTE — Anesthesia Procedure Notes (Signed)
Procedure Name: Intubation Date/Time: 10/19/2012 9:44 AM Performed by: Zenia Resides D Pre-anesthesia Checklist: Patient identified, Emergency Drugs available, Suction available and Patient being monitored Patient Re-evaluated:Patient Re-evaluated prior to inductionOxygen Delivery Method: Circle System Utilized Preoxygenation: Pre-oxygenation with 100% oxygen Intubation Type: IV induction Ventilation: Mask ventilation without difficulty Laryngoscope Size: Mac and 3 Grade View: Grade II Tube type: Oral Number of attempts: 1 Airway Equipment and Method: stylet and oral airway Placement Confirmation: ETT inserted through vocal cords under direct vision,  positive ETCO2 and breath sounds checked- equal and bilateral Secured at: 23 cm Tube secured with: Tape Dental Injury: Teeth and Oropharynx as per pre-operative assessment  Difficulty Due To: Difficulty was unanticipated

## 2012-10-19 NOTE — Op Note (Signed)
Pre-operative Diagnosis: Open wound of the left ear secondary to mohs excision of a basal cell cancer  Post-operative Diagnosis: Same  Procedure:Tissue advancement of 1 cm for closure of the open ear canal, full thickness skin graft to left ear (2 x 4 cm), placement of Acell   Surgeon: Wayland Denis   Assistants: Lazaro Arms, PA  Anesthesia: General endotracheal anesthesia  Indications: The patient is a 79 y.o. male with an open wound of the left ear secondary to mohs excision of a basal cell carcinoma . Consent was signed and reviewed.  Procedure Details  The patient was seen in the Holding Room. The risks, benefits, complications, treatment options, and expected outcomes were discussed with the patient. The possibilities of reaction to medication, pulmonary aspiration, bleeding, recurrent infection, the need for additional procedures, failure to diagnose a condition, and creating a complication requiring operation were discussed with the patient. The patient concurred with the proposed plan, giving informed consent.  The site of surgery properly noted/marked. The patient was taken to the Operating Room.   A Time Out was held and the above information confirmed.  The patient was placed supine after induction of a general anesthetic. The wound was then irrigated with normal saline.  The wound area was then prepped with Betadine and draped in the usual sterile fashion. The wound was dbrided using sharp dissection to the margins of viable tissue. Excellent bleeding was encountered.  A face lift depth was used to release the skin at the anterior portion of the ear to provide more skin coverage.  The was done and the skin was able to reach into ear canal for coverage of the anterior and superior portion of the canal.  A full thickness skin graft was harvested from the left supraclavicular area. The graft measured 2 by 4 cm. The skin graft was defatted and placed on the ear defect.  It was secured  with 4-0 vicryl and bolstered with xeroform. Acell sheet was rolled and placed in the canal. A 4 x 4 and head wrap was applied.  The donor site was closed with 4-0 vicryl and 5-0 monocryl.  Dermabond was applied and a tegaderm.  The patient was allowed to wake up, extubated without difficulty and taken to the recovery room in stable condition.  Instrument, sponge, and needle counts were correct prior to the wound closure and at the conclusion of the case.   Estimated Blood Loss: less than 50 cc         Drains: none          Specimens: none               Complications: none         Condition: stable

## 2012-10-19 NOTE — H&P (View-Only) (Signed)
   This document contains confidential information from a [facility] medical record system and may be unauthenticated. Release may be made only with a valid authorization or in accordance with applicable policies of Medical Center or its affiliates. This document must be maintained in a secure manner or discarded/destroyed as required by Medical Center policy or by a confidential means such as shredding.     Christopher [name]  10/17/2012 8:45 AM   Initial consult  MRN:  [id number]  Department:  Plastic Surgery  Dept Phone: [phone]  Description: Male DOB: 07/26/1924  Christopher [name]: Claire Sanger, DO    Diagnoses -  Basal cell carcinoma of tragus, left    -  Primary   173.21    Mohs defect of auricle, left       380.32     Reason for Visit -  Skin Problem   Vitals - Last Recorded   146/89  53  1.803 m (5' 11")  74.844 kg (165 lb)  23.02 kg/m2       Subjective:    Patient ID: Christopher [name] is a 79 y.o. male.  HPI The patient is an 79 yrs old wm here with his daughter for evaluation of a left ear defect.  He had a growth on the ear that was excised 4 days ago and found to be a basal cell carcinoma.  The defect includes the tragus, skin of the antitragus and lower portion of the concha and superior portion of the lobule.  The area is ~ 2.5 x 2.0 cm. The margins are clear.  He has multiple other medical conditions including hearing difficulty, heart murmur, hypertension, bladder problems and a stroke 04/2012.  He is taking plavix for the stroke. He does not have any sign of infection and no other concerning lesions in the immediate area.  Lymph nodes negative.  The following portions of the patient's history were reviewed and updated as appropriate: allergies, current medications, past family history, past medical history, past social history, past surgical history and problem list.  Review of Systems  Constitutional: Negative.   HENT: Negative.   Eyes: Negative.   Respiratory: Negative.    Cardiovascular: Negative.   Gastrointestinal: Negative.   Endocrine: Negative.   Genitourinary: Negative.   Allergic/Immunologic: Negative.   Neurological: Negative.   Hematological: Bruises/bleeds easily.      Objective:    Physical Exam  Constitutional: He appears well-developed and well-nourished.  HENT:   Head: Normocephalic and atraumatic.  Ears:  Eyes: Conjunctivae and EOM are normal. Pupils are equal, round, and reactive to light.  Neck: Normal range of motion.  Cardiovascular: Normal rate.   Pulmonary/Chest: Effort normal.  Abdominal: Soft.  Musculoskeletal: Normal range of motion.  Neurological: He is alert.  Skin: Skin is warm.  Psychiatric: He has a normal mood and affect. His behavior is normal. Judgment and thought content normal.    Assessment:      1.  Basal cell carcinoma of tragus, left    2.  Mohs defect of auricle, left     Plan:     Recommend repair of the defect with skin graft and tissue re-arrangement.  Possible cartilage graft to recreate the tragus. Risks and complications were reviewed and include bleeding, pain, scar and risk of anesthesia.have left a message with Dr. Sethi at [facility] to discuss plavix [phone]        

## 2012-10-19 NOTE — Interval H&P Note (Signed)
History and Physical Interval Note:  10/19/2012 9:23 AM  Christopher [name]  has presented today for surgery, with the diagnosis of basil cell carcinoma of left ear  The various methods of treatment have been discussed with the patient and family. After consideration of risks, benefits and other options for treatment, the patient has consented to  Procedure(s): REPAIR OF LEFT EAR WITH TISSUE REARRANGEMENT SKIN GRAFT FULL THICKNESS (Left) as a surgical intervention .  The patient's history has been reviewed, patient examined, no change in status, stable for surgery.  I have reviewed the patient's chart and labs.  Questions were answered to the patient's satisfaction.     SANGER,CLAIRE

## 2012-10-19 NOTE — Interval H&P Note (Signed)
History and Physical Interval Note:  10/19/2012 9:44 AM  Christopher [name]  has presented today for surgery, with the diagnosis of basil cell carcinoma of left ear  The various methods of treatment have been discussed with the patient and family. After consideration of risks, benefits and other options for treatment, the patient has consented to  Procedure(s): REPAIR OF LEFT EAR WITH TISSUE REARRANGEMENT SKIN GRAFT FULL THICKNESS (Left) as a surgical intervention .  The patient's history has been reviewed, patient examined, no change in status, stable for surgery.  I have reviewed the patient's chart and labs.  Questions were answered to the patient's satisfaction.     SANGER,CLAIRE

## 2012-10-19 NOTE — Transfer of Care (Signed)
Immediate Anesthesia Transfer of Care Note  Patient: Christopher [name]  Procedure(s) Performed: Procedure(s): REPAIR OF LEFT EAR WITH TISSUE REARRANGEMENT SKIN GRAFT FULL THICKNESS (Left)  Patient Location: PACU  Anesthesia Type:General  Level of Consciousness: awake and alert   Airway & Oxygen Therapy: Patient Spontanous Breathing and Patient connected to face mask oxygen  Post-op Assessment: Report given to PACU RN and Post -op Vital signs reviewed and stable  Post vital signs: Reviewed and stable  Complications: No apparent anesthesia complications

## 2012-10-19 NOTE — Anesthesia Preprocedure Evaluation (Signed)
Anesthesia Evaluation  Patient identified by MRN, date of birth, ID band Patient awake    Reviewed: Allergy & Precautions, H&P , NPO status , Patient's Chart, lab work & pertinent test results  Airway Mallampati: II TM Distance: >3 FB Neck ROM: Full    Dental no notable dental hx. (+) Poor Dentition and Dental Advisory Given   Pulmonary pneumonia -, resolved,  breath sounds clear to auscultation  Pulmonary exam normal       Cardiovascular hypertension, On Medications + dysrhythmias Rhythm:Regular Rate:Normal     Neuro/Psych PSYCHIATRIC DISORDERS CVA, No Residual Symptoms negative neurological ROS     GI/Hepatic Neg liver ROS, GERD-  Medicated and Controlled,  Endo/Other  negative endocrine ROS  Renal/GU Renal diseasenegative Renal ROS  negative genitourinary   Musculoskeletal   Abdominal   Peds  Hematology negative hematology ROS (+)   Anesthesia Other Findings   Reproductive/Obstetrics negative OB ROS                           Anesthesia Physical Anesthesia Plan  ASA: III  Anesthesia Plan: General   Post-op Pain Management:    Induction: Intravenous  Airway Management Planned: Oral ETT  Additional Equipment:   Intra-op Plan:   Post-operative Plan: Extubation in OR  Informed Consent: I have reviewed the patients History and Physical, chart, labs and discussed the procedure including the risks, benefits and alternatives for the proposed anesthesia with the patient or authorized representative who has indicated his/her understanding and acceptance.   Dental advisory given  Plan Discussed with: CRNA  Anesthesia Plan Comments:         Anesthesia Quick Evaluation

## 2012-10-19 NOTE — Anesthesia Postprocedure Evaluation (Signed)
  Anesthesia Post-op Note  Patient: Christopher [name]  Procedure(s) Performed: Procedure(s): REPAIR OF LEFT EAR WITH TISSUE REARRANGEMENT SKIN GRAFT FULL THICKNESS (Left)  Patient Location: PACU  Anesthesia Type:General  Level of Consciousness: awake  Airway and Oxygen Therapy: Patient Spontanous Breathing  Post-op Pain: mild  Post-op Assessment: Post-op Vital signs reviewed, Patient's Cardiovascular Status Stable, Respiratory Function Stable, Patent Airway and No signs of Nausea or vomiting  Post-op Vital Signs: Reviewed and stable  Complications: No apparent anesthesia complications

## 2012-10-20 ENCOUNTER — Encounter (HOSPITAL_BASED_OUTPATIENT_CLINIC_OR_DEPARTMENT_OTHER): Payer: Self-pay | Admitting: Plastic Surgery

## 2012-11-07 ENCOUNTER — Encounter: Payer: Self-pay | Admitting: Family Medicine

## 2012-11-21 ENCOUNTER — Other Ambulatory Visit: Payer: Self-pay | Admitting: Family Medicine

## 2012-12-06 ENCOUNTER — Ambulatory Visit (INDEPENDENT_AMBULATORY_CARE_PROVIDER_SITE_OTHER): Payer: Medicare Other | Admitting: Internal Medicine

## 2012-12-06 ENCOUNTER — Encounter: Payer: Self-pay | Admitting: Internal Medicine

## 2012-12-06 VITALS — BP 110/76 | Ht 71.0 in | Wt 166.4 lb

## 2012-12-06 DIAGNOSIS — I441 Atrioventricular block, second degree: Secondary | ICD-10-CM

## 2012-12-06 DIAGNOSIS — I10 Essential (primary) hypertension: Secondary | ICD-10-CM

## 2012-12-06 DIAGNOSIS — I639 Cerebral infarction, unspecified: Secondary | ICD-10-CM

## 2012-12-06 DIAGNOSIS — I6350 Cerebral infarction due to unspecified occlusion or stenosis of unspecified cerebral artery: Secondary | ICD-10-CM

## 2012-12-06 NOTE — Patient Instructions (Addendum)
Your physician wants you to follow-up in: 12 months with Dr Allred You will receive a reminder letter in the mail two months in advance. If you don't receive a letter, please call our office to schedule the follow-up appointment.  

## 2012-12-06 NOTE — Progress Notes (Signed)
PCP:  Eustaquio Boyden, MD  The patient presents today for routine electrophysiology followup.  He has done well since his last visit to our office.  He has had no symptoms of AV block.  He denies palpitations. Today, he denies symptoms of chest pain, shortness of breath, orthopnea, PND, lower extremity edema, dizziness, presyncope, syncope, or new neurologic sequela.  The patient feels that he is tolerating medications without difficulties and is otherwise without complaint today.   Past Medical History  Diagnosis Date  . GERD (gastroesophageal reflux disease) 08/1996  . Hyperlipemia 08/1986  . Hypertension Pre 1996  . Osteoporosis 12/2004    dexa T -2.8 (05/2012), T11 compression fx  . Pneumonia 22 yoa  . History of Doppler echocardiogram 05/1995    MVP, MOD AI, MILD TR, MILD MR  . Elevated PSA     decided against further eval  . Diverticulosis   . Bilateral sensorineural hearing loss 02/2012    some conductive left side  . Compression fracture     multiple  . AV block, 2nd degree 04/2012    seen cards, monitor for now, pt does not desire monitor/pacer  . BCC (basal cell carcinoma of skin) 08/2012    s/p excision Terri Piedra)  . Stroke   . HOH (hard of hearing)   . Mental disorder   . Depression   . Chronic kidney disease     elevated PSA-no tx   Past Surgical History  Procedure Laterality Date  . Cholecystectomy  05/1995  . Exercise treadmill  09/12/2006    NML  . Inguinal hernia repair  08/14/2010    Dr. Daphine Deutscher  . Esophagogastroduodenoscopy  08/18/2004    H. H. Dr. Russella Dar  . Colonoscopy  05/01/2007    divericulosis, int hemorrhoids - Dr. Russella Dar  . Dexa  05/2012    T score -2.8 hip, T11 compression fx  . Skin full thickness graft Left 10/19/2012    Procedure: REPAIR OF LEFT EAR WITH TISSUE REARRANGEMENT SKIN GRAFT FULL THICKNESS;  Surgeon: Wayland Denis, DO;  Location: [facility];  Service: Plastics;  Laterality: Left;    Current Outpatient Prescriptions   Medication Sig Dispense Refill  . acetaminophen (TYLENOL) 500 MG tablet Take 1,000 mg by mouth every 6 (six) hours as needed. For pain      . alendronate (FOSAMAX) 70 MG tablet Take 1 tablet (70 mg total) by mouth every 7 (seven) days. Take with a full glass of water on an empty stomach.  3 tablet  4  . Cholecalciferol (VITAMIN D) 1000 UNITS capsule Take 1,000 Units by mouth every evening.       . clopidogrel (PLAVIX) 75 MG tablet Take 1 tablet (75 mg total) by mouth daily.  30 tablet  6  . Flaxseed, Linseed, (FLAX SEED OIL) 1000 MG CAPS Take 1 capsule by mouth every evening.       . hydrochlorothiazide (MICROZIDE) 12.5 MG capsule TAKE ONE CAPSULE BY MOUTH EVERY DAY  30 capsule  6  . KLOR-CON M20 20 MEQ tablet TAKE 1 TABLET BY MOUTH EVERY DAY  30 tablet  11  . lisinopril (PRINIVIL,ZESTRIL) 5 MG tablet TAKE 1 TABLET BY MOUTH EVERY DAY  30 tablet  6  . loratadine (CLARITIN) 10 MG tablet TAKE 1 TABLET (10 MG TOTAL) BY MOUTH DAILY.  30 tablet  11  . Multiple Vitamin (MULTIVITAMIN WITH MINERALS) TABS Take 1 tablet by mouth daily.      Marland Kitchen NEXIUM 40 MG capsule TAKE  ONE CAPSULE BY MOUTH EVERY DAY  30 capsule  11  . sertraline (ZOLOFT) 25 MG tablet Take 1 tablet (25 mg total) by mouth daily.  30 tablet  6  . traMADol (ULTRAM) 50 MG tablet Take 1 tablet (50 mg total) by mouth 2 (two) times daily as needed for pain.  60 tablet  1   No current facility-administered medications for this visit.    No Known Allergies  History   Social History  . Marital Status: Single    Spouse Name: N/A    Number of Children: N/A  . Years of Education: N/A   Occupational History  . [employer]/Retired     Parttime delivers flowers   Social History Main Topics  . Smoking status: Former Games developer  . Smokeless tobacco: Former Neurosurgeon     Comment: smoked minimal as teen  . Alcohol Use: No  . Drug Use: No  . Sexually Active: No   Other Topics Concern  . Not on file   Social History Narrative   Caffeine:  2-3 cups/day, 1 gallon iced tea   Lives alone.  Widower.  2 dogs at home.   Lots of driving.   Activity: walking - 57min-1hr daily   Diet: some water, vegetables daily, occasional fruits    Family History  Problem Relation Age of Onset  . Heart disease Mother     MI  . Hypertension Mother   . Stroke Mother     ?  . Stroke Father   . Diabetes Sister   . Hypertension Sister   . Hypertension Sister   . Hypertension Sister   . Stroke Sister     recurrent mini strokes  . Cancer Neg Hx     Physical Exam: Filed Vitals:   12/06/12 1404  BP: 110/76  Height: 5\' 11"  (1.803 m)  Weight: 166 lb 6.4 oz (75.479 kg)    GEN- The patient is elderly appearing, alert and oriented x 3 today.   Head- normocephalic, atraumatic Eyes-  Sclera clear, conjunctiva pink Ears- hearing intact Oropharynx- clear Neck- supple  Lungs- Clear to ausculation bilaterally, normal work of breathing Heart- Regular rate and rhythm, no murmurs, rubs or gallops, PMI not laterally displaced GI- soft, NT, ND, + BS Extremities- no clubbing, cyanosis, or edema  ekg today reveals sinus rhythm with first degree AV block (PR ), otherwise normal ekg  Assessment and Plan:  1. Second degree AV block No indication for pacing at this time Avoid AV nodal agents  2. HTN Stable No change required today  3. Prior CVA Dr Marlis Edelson note from 2/14 reviewed Continue plavix and bp control He will follow up with Dr Pearlean Brownie in August.  Return to see me in a year

## 2013-01-02 ENCOUNTER — Other Ambulatory Visit: Payer: Self-pay | Admitting: Family Medicine

## 2013-01-02 NOTE — Telephone Encounter (Signed)
Ok to refill 

## 2013-01-08 ENCOUNTER — Other Ambulatory Visit: Payer: Self-pay | Admitting: Family Medicine

## 2013-01-15 ENCOUNTER — Other Ambulatory Visit: Payer: Self-pay | Admitting: Family Medicine

## 2013-01-17 ENCOUNTER — Ambulatory Visit (INDEPENDENT_AMBULATORY_CARE_PROVIDER_SITE_OTHER): Payer: Medicare Other | Admitting: Family Medicine

## 2013-01-17 ENCOUNTER — Encounter: Payer: Self-pay | Admitting: Family Medicine

## 2013-01-17 VITALS — BP 138/90 | HR 80 | Temp 97.7°F | Wt 162.5 lb

## 2013-01-17 DIAGNOSIS — R0609 Other forms of dyspnea: Secondary | ICD-10-CM | POA: Insufficient documentation

## 2013-01-17 DIAGNOSIS — R0600 Dyspnea, unspecified: Secondary | ICD-10-CM

## 2013-01-17 DIAGNOSIS — R531 Weakness: Secondary | ICD-10-CM

## 2013-01-17 DIAGNOSIS — R5381 Other malaise: Secondary | ICD-10-CM

## 2013-01-17 LAB — COMPREHENSIVE METABOLIC PANEL
Albumin: 4.1 g/dL (ref 3.5–5.2)
BUN: 19 mg/dL (ref 6–23)
CO2: 24 mEq/L (ref 19–32)
Calcium: 10.4 mg/dL (ref 8.4–10.5)
Chloride: 106 mEq/L (ref 96–112)
Glucose, Bld: 93 mg/dL (ref 70–99)
Potassium: 4.8 mEq/L (ref 3.5–5.1)
Total Protein: 8.2 g/dL (ref 6.0–8.3)

## 2013-01-17 LAB — TSH: TSH: 0.81 u[IU]/mL (ref 0.35–5.50)

## 2013-01-17 LAB — CBC WITH DIFFERENTIAL/PLATELET
Basophils Relative: 0.6 % (ref 0.0–3.0)
Eosinophils Absolute: 0.2 10*3/uL (ref 0.0–0.7)
Eosinophils Relative: 1.8 % (ref 0.0–5.0)
Lymphocytes Relative: 21.4 % (ref 12.0–46.0)
Neutrophils Relative %: 66.3 % (ref 43.0–77.0)
Platelets: 230 10*3/uL (ref 150.0–400.0)
RBC: 4.91 Mil/uL (ref 4.22–5.81)
WBC: 11.2 10*3/uL — ABNORMAL HIGH (ref 4.5–10.5)

## 2013-01-17 LAB — BRAIN NATRIURETIC PEPTIDE: Pro B Natriuretic peptide (BNP): 56 pg/mL (ref 0.0–100.0)

## 2013-01-17 NOTE — Assessment & Plan Note (Addendum)
Progressive change of recently increasing dyspnea associated with increased weakness and generalized malaise. Exam today overall stable - no obvious cause found. Check CMP, BNP, CBC and TSH today. Check EKG today - NSR rate 80s, normal axis, 1st degree AVB, no hypertrophy, or acute ST/T changes.  Overall stable from prior. Not on any AV nodal agent. Will continue to monitor - and suggested she see electrophysiologist if continued deteriorating fatigue level.   No changes today.  Pt agrees with plan.

## 2013-01-17 NOTE — Progress Notes (Signed)
Subjective:    Patient ID: Christopher [name], male    DOB: 1923-10-31, 79 y.o.   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  On plavix only s/p stroke.  Last saw neurology 07/2012.  Told not surgical candidate.  Has f/u appt with Dr. Pearlean Brownie 03/2013.  For last month, notices significant increase in fatigue and weakness with exertion.  Also feels some dyspnea with exertion.  Some leg cramping/pulling when walking.  Back has started hurting him.  This has been going in since ear surgery for skin cancer. Dizziness - trouble describing. Increased constipation recently.  Trouble sleeping. No chest pain, leg swelling, significant cough, headaches or palpitations.  No bruising or bleeding.   Has decided not to drive.  Wt Readings from Last 3 Encounters:  01/17/13 162 lb 8 oz (73.71 kg)  12/06/12 166 lb 6.4 oz (75.479 kg)  10/19/12 161 lb 9.6 oz (73.301 kg)    Past Medical History  Diagnosis Date  . GERD (gastroesophageal reflux disease) 08/1996  . Hyperlipemia 08/1986  . Hypertension Pre 1996  . Osteoporosis 12/2004    dexa T -2.8 (05/2012), T11 compression fx  . Pneumonia 22 yoa  . History of Doppler echocardiogram 05/1995    MVP, MOD AI, MILD TR, MILD MR  . Elevated PSA     decided against further eval  . Diverticulosis   . Bilateral sensorineural hearing loss 02/2012    some conductive left side  . Compression fracture     multiple  . AV block, 2nd degree 04/2012    seen cards, monitor for now, pt does not desire monitor/pacer  . BCC (basal cell carcinoma of skin) 08/2012    s/p excision Terri Piedra)  . Stroke   . HOH (hard of hearing)   . Mental disorder   . Depression   . Chronic kidney disease     elevated PSA-no tx   Past Surgical History  Procedure Laterality Date  . Cholecystectomy  05/1995  .  Exercise treadmill  09/12/2006    NML  . Inguinal hernia repair  08/14/2010    Dr. Daphine Deutscher  . Esophagogastroduodenoscopy  08/18/2004    [facility]. H. Dr. Russella Dar  . Colonoscopy  05/01/2007    divericulosis, int hemorrhoids - Dr. Russella Dar  . Dexa  05/2012    T score -2.8 hip, T11 compression fx  . Skin full thickness graft Left 10/19/2012    Procedure: REPAIR OF LEFT EAR WITH TISSUE REARRANGEMENT SKIN GRAFT FULL THICKNESS;  Surgeon: Wayland Denis, DO;  Location: [facility];  Service: Plastics;  Laterality: Left;    Review of Systems Per HPI    Objective:   Physical Exam  Nursing note and vitals reviewed. Constitutional: He appears well-developed and well-nourished. No distress.  HENT:  Head: Normocephalic and atraumatic.  Mouth/Throat: Oropharynx is clear and moist. No oropharyngeal exudate.  Eyes: Conjunctivae and EOM are normal. Pupils are equal, round, and reactive to light. No scleral icterus.  Neck: Normal range of motion. Neck supple. No JVD present. Carotid bruit is not present. No thyromegaly present.  Cardiovascular: Normal rate, regular rhythm, normal heart sounds and intact distal pulses.   No murmur heard. Pulmonary/Chest: Effort normal and breath sounds normal. No respiratory distress. He has no wheezes. He has no rales.  Bibasilar crackles, some clearing with deep cough  Musculoskeletal: He exhibits  edema (tr pedal edema).  Neurological:  5/5 strength BLE  Skin: Skin is warm and dry. No rash noted.       Assessment & Plan:

## 2013-01-17 NOTE — Patient Instructions (Addendum)
Let's check EKG today - we will call you with results Blood work today. Let me know sooner if worsening, otherwise keep appointment next month. Good to see you today, call us with questions.

## 2013-02-11 ENCOUNTER — Other Ambulatory Visit: Payer: Self-pay | Admitting: Family Medicine

## 2013-02-11 DIAGNOSIS — E785 Hyperlipidemia, unspecified: Secondary | ICD-10-CM

## 2013-02-12 ENCOUNTER — Other Ambulatory Visit (INDEPENDENT_AMBULATORY_CARE_PROVIDER_SITE_OTHER): Payer: Medicare Other

## 2013-02-12 DIAGNOSIS — E785 Hyperlipidemia, unspecified: Secondary | ICD-10-CM

## 2013-02-12 LAB — LIPID PANEL
Cholesterol: 195 mg/dL (ref 0–200)
LDL Cholesterol: 124 mg/dL — ABNORMAL HIGH (ref 0–99)
Triglycerides: 149 mg/dL (ref 0.0–149.0)
VLDL: 29.8 mg/dL (ref 0.0–40.0)

## 2013-02-15 ENCOUNTER — Emergency Department (HOSPITAL_COMMUNITY)
Admission: EM | Admit: 2013-02-15 | Discharge: 2013-02-15 | Disposition: A | Discharge status: Home / Self Care | Payer: Medicare Other | Attending: Emergency Medicine | Admitting: Emergency Medicine

## 2013-02-15 ENCOUNTER — Ambulatory Visit (INDEPENDENT_AMBULATORY_CARE_PROVIDER_SITE_OTHER): Payer: Medicare Other | Admitting: Family Medicine

## 2013-02-15 ENCOUNTER — Other Ambulatory Visit: Payer: Self-pay

## 2013-02-15 ENCOUNTER — Emergency Department (HOSPITAL_COMMUNITY): Payer: Medicare Other

## 2013-02-15 ENCOUNTER — Encounter: Payer: Self-pay | Admitting: Family Medicine

## 2013-02-15 VITALS — BP 150/100 | HR 79 | Temp 97.5°F | Ht 71.0 in | Wt 166.0 lb

## 2013-02-15 DIAGNOSIS — R0602 Shortness of breath: Secondary | ICD-10-CM | POA: Insufficient documentation

## 2013-02-15 DIAGNOSIS — G4700 Insomnia, unspecified: Secondary | ICD-10-CM

## 2013-02-15 DIAGNOSIS — R972 Elevated prostate specific antigen [PSA]: Secondary | ICD-10-CM | POA: Insufficient documentation

## 2013-02-15 DIAGNOSIS — R799 Abnormal finding of blood chemistry, unspecified: Secondary | ICD-10-CM | POA: Insufficient documentation

## 2013-02-15 DIAGNOSIS — F3289 Other specified depressive episodes: Secondary | ICD-10-CM | POA: Insufficient documentation

## 2013-02-15 DIAGNOSIS — M810 Age-related osteoporosis without current pathological fracture: Secondary | ICD-10-CM | POA: Insufficient documentation

## 2013-02-15 DIAGNOSIS — I10 Essential (primary) hypertension: Secondary | ICD-10-CM

## 2013-02-15 DIAGNOSIS — Z79899 Other long term (current) drug therapy: Secondary | ICD-10-CM | POA: Insufficient documentation

## 2013-02-15 DIAGNOSIS — E785 Hyperlipidemia, unspecified: Secondary | ICD-10-CM

## 2013-02-15 DIAGNOSIS — I129 Hypertensive chronic kidney disease with stage 1 through stage 4 chronic kidney disease, or unspecified chronic kidney disease: Secondary | ICD-10-CM | POA: Insufficient documentation

## 2013-02-15 DIAGNOSIS — Z0000 Encounter for general adult medical examination without abnormal findings: Secondary | ICD-10-CM

## 2013-02-15 DIAGNOSIS — K5900 Constipation, unspecified: Secondary | ICD-10-CM

## 2013-02-15 DIAGNOSIS — K219 Gastro-esophageal reflux disease without esophagitis: Secondary | ICD-10-CM | POA: Insufficient documentation

## 2013-02-15 DIAGNOSIS — I441 Atrioventricular block, second degree: Secondary | ICD-10-CM | POA: Insufficient documentation

## 2013-02-15 DIAGNOSIS — Z87891 Personal history of nicotine dependence: Secondary | ICD-10-CM | POA: Insufficient documentation

## 2013-02-15 DIAGNOSIS — R195 Other fecal abnormalities: Secondary | ICD-10-CM

## 2013-02-15 DIAGNOSIS — F329 Major depressive disorder, single episode, unspecified: Secondary | ICD-10-CM | POA: Insufficient documentation

## 2013-02-15 DIAGNOSIS — H903 Sensorineural hearing loss, bilateral: Secondary | ICD-10-CM | POA: Insufficient documentation

## 2013-02-15 DIAGNOSIS — R55 Syncope and collapse: Secondary | ICD-10-CM

## 2013-02-15 DIAGNOSIS — C4491 Basal cell carcinoma of skin, unspecified: Secondary | ICD-10-CM | POA: Insufficient documentation

## 2013-02-15 DIAGNOSIS — Z8673 Personal history of transient ischemic attack (TIA), and cerebral infarction without residual deficits: Secondary | ICD-10-CM | POA: Insufficient documentation

## 2013-02-15 DIAGNOSIS — R7989 Other specified abnormal findings of blood chemistry: Secondary | ICD-10-CM

## 2013-02-15 LAB — BASIC METABOLIC PANEL
BUN: 20 mg/dL (ref 6–23)
Calcium: 9.8 mg/dL (ref 8.4–10.5)
GFR calc Af Amer: 86 mL/min — ABNORMAL LOW (ref >90)
GFR calc non Af Amer: 75 mL/min — ABNORMAL LOW (ref >90)
Potassium: 4.2 mEq/L (ref 3.5–5.1)
Sodium: 136 mEq/L (ref 135–145)

## 2013-02-15 LAB — CBC WITH DIFFERENTIAL/PLATELET
Basophils Relative: 0 % (ref 0–1)
Eosinophils Absolute: 0.2 10*3/uL (ref 0.0–0.7)
Eosinophils Relative: 2 % (ref 0–5)
MCH: 32 pg (ref 26.0–34.0)
MCHC: 35.5 g/dL (ref 30.0–36.0)
Neutrophils Relative %: 67 % (ref 43–77)
Platelets: 204 10*3/uL (ref 150–400)

## 2013-02-15 LAB — POCT I-STAT TROPONIN I: Troponin i, poc: 0 ng/mL (ref 0.00–0.08)

## 2013-02-15 MED ORDER — FUROSEMIDE 40 MG PO TABS: 40.0000 mg | ORAL_TABLET | Freq: Every day | ORAL | Status: DC

## 2013-02-15 MED ORDER — LISINOPRIL 5 MG PO TABS: 5.0000 mg | ORAL_TABLET | Freq: Two times a day (BID) | ORAL | Status: DC

## 2013-02-15 MED ORDER — ATORVASTATIN CALCIUM 10 MG PO TABS: 10.0000 mg | ORAL_TABLET | Freq: Every day | ORAL | Status: DC

## 2013-02-15 MED ORDER — FUROSEMIDE 10 MG/ML IJ SOLN
40.0000 mg | INTRAMUSCULAR | Status: AC
  Administered 2013-02-15: 40 mg via INTRAVENOUS
  Filled 2013-02-15: qty 4

## 2013-02-15 NOTE — ED Notes (Signed)
Pt from home c/o SOB for several weeks. Pt denies symptoms at this time, Pt denies CP, N/V. Pt was evaluated at PCP this AM. No signs of distress

## 2013-02-15 NOTE — Patient Instructions (Addendum)
Bring me a copy of your living will to update your chart. Increase lisinopril to 5mg  twice daily to help with blood pressure. For cholesterol - start medicine called lipitor 10mg  daily. Good to see you today, call us with question. Schedule eye doctor appointment. For sleep - try over the counter melatonin or benadryl to help. For constipation - increase water intake.  If this doesn't help, start taking colace once to twice daily.

## 2013-02-15 NOTE — Progress Notes (Signed)
Subjective:    Patient ID: Christopher [name], male    DOB: 08/03/1924, 79 y.o.   MRN: [id number]  HPI CC: medicare wellness  Trouble sleeping - would like sleeping aide.    Constipation - very hard bowel movements, but goes regularly.  Sticky, sometimes very dark.  Using some OTC laxative (dulcolax).  Drinking some water.    Last Saturday had episode where he may have fainted.  Sitting in chair, fell out of chair.  May have passed out for a few seconds.    Noticing occasional tremors in hands with occasional dyspnea.  Occasional headaches.  Has stopped driving since 82/9562.  Turned in tags.  Pt has accepted this.  Preventative:  Last year had CPE with Dr. Hetty Ely.  Colonoscopy 2008, diverticulosis and int hemorrhoids. ifob today and stool card today. Prostate screening - elevated PSA in past, known enlarged prostate but smooth. Had aged out in past. Will recheck this visit Tetanus - 2013.  Pneumonia shot 1998 - has completed zostavax 2010  Flu shot yearly.  Advanced directives: has living will at home - daughter has it. Filled out by lawyer. Daughter is HCPOA Gillian Scarce).  Hearing problem - has noticed problems hearing. Esp with hearing in crowded room or at church. Worried about cost of hearing aides.  Will consider in future. Vision screen today.  Denies other falls in last year. Some staggering. Denies anhedonia, depression, sadness.   Caffeine: 2-3 cups/day, 1 gallon iced tea  Lives alone. Widower. 2 dogs at home.  Lots of driving.  Activity: walking - 75min-1hr daily  Diet: some water, vegetables daily, occasional fruits  Medications and allergies reviewed and updated in chart.  Past histories reviewed and updated if relevant as below. Patient Active Problem List   Diagnosis Date Noted  . Dyspnea 01/17/2013  . Oropharyngeal dysphagia 08/23/2012  . 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 Palpitations 06/13/2012  . MDD (major depressive disorder), single  episode 06/13/2012  . CVA (cerebral infarction) 04/20/2012  . Second degree AV block 04/17/2012  . Anemia 04/03/2012  . Compression fracture 04/02/2012  . Hypercalcemia 04/02/2012  . HIATAL HERNIA 10/27/2007  . HEMORRHOIDS, INTERNAL 05/01/2007  . DIVERTICULOSIS, COLON 05/01/2007  . HYPERLIPIDEMIA 01/06/2007  . HYPERTENSION 01/06/2007  . GERD 01/06/2007  . OSTEOPOROSIS 01/06/2007  . ROSACEA 01/05/2007  . PROSTATE SPECIFIC ANTIGEN, ELEVATED 01/05/2007   Past Medical History  Diagnosis Date  . GERD (gastroesophageal reflux disease) 08/1996  . Hyperlipemia 08/1986  . Hypertension Pre 1996  . Osteoporosis 12/2004    dexa T -2.8 (05/2012), T11 compression fx  . Pneumonia 22 yoa  . History of Doppler echocardiogram 05/1995    MVP, MOD AI, MILD TR, MILD MR  . Elevated PSA     decided against further eval  . Diverticulosis   . Bilateral sensorineural hearing loss 02/2012    some conductive left side  . Compression fracture     multiple  . AV block, 2nd degree 04/2012    seen cards, monitor for now, pt does not desire monitor/pacer  . BCC (basal cell carcinoma of skin) 08/2012    s/p excision Terri Piedra)  . Stroke   . HOH (hard of hearing)   . Mental disorder   . Depression   . Chronic kidney disease     elevated PSA-no tx   Past Surgical History  Procedure Laterality Date  . Cholecystectomy  05/1995  . Exercise treadmill  09/12/2006    NML  . Inguinal  hernia repair  08/14/2010    Dr. Daphine Deutscher  . Esophagogastroduodenoscopy  08/18/2004    H. H. Dr. Russella Dar  . Colonoscopy  05/01/2007    divericulosis, int hemorrhoids - Dr. Russella Dar  . Dexa  05/2012    T score -2.8 hip, T11 compression fx  . Skin full thickness graft Left 10/19/2012    Procedure: REPAIR OF LEFT EAR WITH TISSUE REARRANGEMENT SKIN GRAFT FULL THICKNESS;  Surgeon: Wayland Denis, DO;  Location: [facility];  Service: Plastics;  Laterality: Left;   History  Substance Use Topics  . Smoking status: Former  Games developer  . Smokeless tobacco: Former Neurosurgeon     Comment: smoked minimal as teen  . Alcohol Use: No   Family History  Problem Relation Age of Onset  . Heart disease Mother     MI  . Hypertension Mother   . Stroke Mother     ?  . Stroke Father   . Diabetes Sister   . Hypertension Sister   . Hypertension Sister   . Hypertension Sister   . Stroke Sister     recurrent mini strokes  . Cancer Neg Hx    No Known Allergies Current Outpatient Prescriptions on File Prior to Visit  Medication Sig Dispense Refill  . acetaminophen (TYLENOL) 500 MG tablet Take 1,000 mg by mouth every 6 (six) hours as needed. For pain      . alendronate (FOSAMAX) 70 MG tablet TAKE 1 TABLET BY MOUTH EVERY 7 DAYS WITH A FULL GLASS OF WATER ON AN EMPTY STOMACH  4 tablet  4  . Cholecalciferol (VITAMIN D) 1000 UNITS capsule Take 1,000 Units by mouth every evening.       . clopidogrel (PLAVIX) 75 MG tablet TAKE 1 TABLET BY MOUTH EVERY DAY WITH BREAKFAST  30 tablet  11  . Flaxseed, Linseed, (FLAX SEED OIL) 1000 MG CAPS Take 1 capsule by mouth every evening.       . hydrochlorothiazide (MICROZIDE) 12.5 MG capsule TAKE ONE CAPSULE BY MOUTH EVERY DAY  30 capsule  11  . KLOR-CON M20 20 MEQ tablet TAKE 1 TABLET BY MOUTH EVERY DAY  30 tablet  11  . lisinopril (PRINIVIL,ZESTRIL) 5 MG tablet TAKE 1 TABLET BY MOUTH EVERY DAY  30 tablet  11  . loratadine (CLARITIN) 10 MG tablet TAKE 1 TABLET (10 MG TOTAL) BY MOUTH DAILY.  30 tablet  11  . Multiple Vitamin (MULTIVITAMIN WITH MINERALS) TABS Take 1 tablet by mouth daily.      Marland Kitchen NEXIUM 40 MG capsule TAKE ONE CAPSULE BY MOUTH EVERY DAY  30 capsule  11  . sertraline (ZOLOFT) 25 MG tablet TAKE 1 TABLET (25 MG TOTAL) BY MOUTH DAILY.  30 tablet  3  . traMADol (ULTRAM) 50 MG tablet        No current facility-administered medications on file prior to visit.      Review of Systems  Constitutional: Negative for fever, chills, activity change, appetite change, fatigue and unexpected weight  change (weight very stable).  HENT: Negative for hearing loss and neck pain.   Eyes: Negative for visual disturbance.  Respiratory: Positive for shortness of breath. Negative for cough, chest tightness and wheezing.   Cardiovascular: Negative for chest pain, palpitations and leg swelling.  Gastrointestinal: Positive for constipation. Negative for nausea, vomiting, abdominal pain, diarrhea, blood in stool and abdominal distention.  Genitourinary: Negative for hematuria and difficulty urinating.  Musculoskeletal: Negative for myalgias and arthralgias.  Skin: Negative for rash.  Neurological: Positive for syncope and headaches. Negative for dizziness and seizures.  Hematological: Negative for adenopathy. Does not bruise/bleed easily.  Psychiatric/Behavioral: Negative for dysphoric mood. The patient is not nervous/anxious.        Objective:   Physical Exam  Nursing note and vitals reviewed. Constitutional: He is oriented to person, place, and time. He appears well-developed and well-nourished. No distress.  HENT:  Head: Normocephalic and atraumatic.  Right Ear: External ear normal.  Left Ear: External ear normal.  Nose: Nose normal.  Mouth/Throat: Oropharynx is clear and moist.  S/p L ear surgery  Eyes: Conjunctivae and EOM are normal. Pupils are equal, round, and reactive to light.  Neck: Normal range of motion. Neck supple. No thyromegaly present.  Cardiovascular: Normal rate, regular rhythm and intact distal pulses.   Murmur (3/6 SEM) heard. Pulses:      Radial pulses are 2+ on the right side, and 2+ on the left side.  Pulmonary/Chest: Effort normal and breath sounds normal. No respiratory distress. He has no wheezes. He has no rales.  Genitourinary: Rectum normal. Rectal exam shows no external hemorrhoid, no internal hemorrhoid, no fissure, no mass, no tenderness and anal tone normal. Guaiac negative stool. Prostate is enlarged (35-40gm). Prostate is not tender.  Musculoskeletal:  Normal range of motion.  Lymphadenopathy:    He has no cervical adenopathy.  Neurological: He is alert and oriented to person, place, and time.  CN grossly intact, station and gait intact  Skin: Skin is warm and dry. No rash noted.  Psychiatric: He has a normal mood and affect. His behavior is normal. Judgment and thought content normal.       Assessment & Plan:

## 2013-02-15 NOTE — ED Provider Notes (Signed)
History    CSN: [id number] Arrival date & time 02/15/13  1726  First MD Initiated Contact with Patient 02/15/13 1726     Chief Complaint  Patient presents with  . Shortness of Breath   (Consider location/radiation/quality/duration/timing/severity/associated sxs/prior Treatment) The history is provided by the patient and medical records.   Pt presents to the ED for SOB x 2 weeks.  Pt states he always feels like he is "not getting enough air" and cannot take deep breaths like he used to.  No chest pain/pressure, palpitations, diaphoresis, dizziness, weakness, numbness, or paresthesias of extremities.  Pt saw his PCP this morning at routine FU, OP labs were ordered.  Pt states he went home and tried to rest but he was having such a difficult time breathing that he had to call EMS.  No hx of asthma, CHF, or COPD.  Stroke last august affecting right side-- no residual deficits.  No hx of CAD or MI.  Has seen cardiology in the past and told he had a murmur but was told to FU on PRN basis.  Hx of elevated PSA and enlarged prostate, never pursued further evaluation.  No hx of PE or DVT.  No recent travel or LE edema.  Past Medical History  Diagnosis Date  . GERD (gastroesophageal reflux disease) 08/1996  . Hyperlipemia 08/1986  . Hypertension Pre 1996  . Osteoporosis 12/2004    dexa T -2.8 (05/2012), T11 compression fx  . Pneumonia 22 yoa  . History of Doppler echocardiogram 05/1995    MVP, MOD AI, MILD TR, MILD MR  . Elevated PSA     decided against further eval  . Diverticulosis   . Bilateral sensorineural hearing loss 02/2012    some conductive left side  . Compression fracture     multiple  . AV block, 2nd degree 04/2012    seen cards, monitor for now, pt does not desire monitor/pacer  . BCC (basal cell carcinoma of skin) 08/2012    s/p excision Terri Piedra)  . Stroke   . HOH (hard of hearing)   . Mental disorder   . Depression   . Chronic kidney disease     elevated PSA-no tx    Past Surgical History  Procedure Laterality Date  . Cholecystectomy  05/1995  . Exercise treadmill  09/12/2006    NML  . Inguinal hernia repair  08/14/2010    Dr. Daphine Deutscher  . Esophagogastroduodenoscopy  08/18/2004    H. H. Dr. Russella Dar  . Colonoscopy  05/01/2007    divericulosis, int hemorrhoids - Dr. Russella Dar  . Dexa  05/2012    T score -2.8 hip, T11 compression fx  . Skin full thickness graft Left 10/19/2012    Procedure: REPAIR OF LEFT EAR WITH TISSUE REARRANGEMENT SKIN GRAFT FULL THICKNESS;  Surgeon: Wayland Denis, DO;  Location: [facility];  Service: Plastics;  Laterality: Left;   Family History  Problem Relation Age of Onset  . Heart disease Mother     MI  . Hypertension Mother   . Stroke Mother     ?  . Stroke Father   . Diabetes Sister   . Hypertension Sister   . Hypertension Sister   . Hypertension Sister   . Stroke Sister     recurrent mini strokes  . Cancer Neg Hx    History  Substance Use Topics  . Smoking status: Former Games developer  . Smokeless tobacco: Former Neurosurgeon     Comment: smoked minimal as teen  .  Alcohol Use: No    Review of Systems  Respiratory: Positive for shortness of breath.   All other systems reviewed and are negative.    Allergies  Review of patient's allergies indicates no known allergies.  Home Medications   Current Outpatient Rx  Name  Route  Sig  Dispense  Refill  . acetaminophen (TYLENOL) 500 MG tablet   Oral   Take 1,000 mg by mouth every 6 (six) hours as needed. For pain         . alendronate (FOSAMAX) 70 MG tablet      TAKE 1 TABLET BY MOUTH EVERY 7 DAYS WITH A FULL GLASS OF WATER ON AN EMPTY STOMACH   4 tablet   4   . atorvastatin (LIPITOR) 10 MG tablet   Oral   Take 1 tablet (10 mg total) by mouth daily.   30 tablet   11   . Cholecalciferol (VITAMIN D) 1000 UNITS capsule   Oral   Take 1,000 Units by mouth every evening.          . clopidogrel (PLAVIX) 75 MG tablet      TAKE 1 TABLET BY MOUTH EVERY  DAY WITH BREAKFAST   30 tablet   11   . Flaxseed, Linseed, (FLAX SEED OIL) 1000 MG CAPS   Oral   Take 1 capsule by mouth every evening.          . hydrochlorothiazide (MICROZIDE) 12.5 MG capsule      TAKE ONE CAPSULE BY MOUTH EVERY DAY   30 capsule   11   . KLOR-CON M20 20 MEQ tablet      TAKE 1 TABLET BY MOUTH EVERY DAY   30 tablet   11   . lisinopril (PRINIVIL,ZESTRIL) 5 MG tablet   Oral   Take 1 tablet (5 mg total) by mouth 2 (two) times daily.   60 tablet   11   . loratadine (CLARITIN) 10 MG tablet      TAKE 1 TABLET (10 MG TOTAL) BY MOUTH DAILY.   30 tablet   11   . Multiple Vitamin (MULTIVITAMIN WITH MINERALS) TABS   Oral   Take 1 tablet by mouth daily.         Marland Kitchen NEXIUM 40 MG capsule      TAKE ONE CAPSULE BY MOUTH EVERY DAY   30 capsule   11   . sertraline (ZOLOFT) 25 MG tablet      TAKE 1 TABLET (25 MG TOTAL) BY MOUTH DAILY.   30 tablet   3   . traMADol (ULTRAM) 50 MG tablet                BP 165/95  Pulse 91  Temp(Src) 97.9 F (36.6 C) (Oral)  Resp 16  SpO2 94%  Physical Exam  Nursing note and vitals reviewed. Constitutional: He is oriented to person, place, and time. He appears well-developed and well-nourished.  HENT:  Head: Normocephalic and atraumatic.  Mouth/Throat: Uvula is midline and oropharynx is clear and moist. No oropharyngeal exudate, posterior oropharyngeal edema, posterior oropharyngeal erythema or tonsillar abscesses.  Mildly dry mucus membranes  Eyes: Conjunctivae and EOM are normal. Pupils are equal, round, and reactive to light.  Neck: Normal range of motion. Neck supple.  Cardiovascular: Normal rate, regular rhythm and normal heart sounds.   Pulmonary/Chest: Effort normal and breath sounds normal. He has no decreased breath sounds. He has no wheezes.  Abdominal: Soft. Bowel sounds are normal. There is no tenderness.  There is no guarding.  Musculoskeletal: Normal range of motion.  Neurological: He is alert and  oriented to person, place, and time. He has normal strength. He displays no tremor. No cranial nerve deficit or sensory deficit. He displays no seizure activity. Coordination normal.  CN grossly intact, moves all extremities appropriately without ataxia, equal grip strength UE bilaterally, no focal neuro deficits or facial droop appreciated  Skin: Skin is warm and dry.  Psychiatric: He has a normal mood and affect.    ED Course  Procedures (including critical care time)   Date: 02/15/2013  Rate: 76  Rhythm: normal sinus rhythm  QRS Axis: normal  Intervals: PR prolonged  ST/T Wave abnormalities: normal  Conduction Disutrbances:first-degree A-V block   Narrative Interpretation: NSR, 1st AV block, no STEMI  Old EKG Reviewed: unchanged   Labs Reviewed  BASIC METABOLIC PANEL - Abnormal; Notable for the following:    Glucose, Bld 110 (*)    GFR calc non Af Amer 75 (*)    GFR calc Af Amer 86 (*)    All other components within normal limits  PRO B NATRIURETIC PEPTIDE - Abnormal; Notable for the following:    Pro B Natriuretic peptide (BNP) 1205.0 (*)    All other components within normal limits  CBC WITH DIFFERENTIAL  POCT I-STAT TROPONIN I   Dg Chest 2 View  02/15/2013   *RADIOLOGY REPORT*  Clinical Data: Shortness of breath.  History of hypertension and pneumonia.  CHEST - 2 VIEW  Comparison: 04/16/2012.  Findings: There is stable mild cardiac enlargement.  There is an enlarging air fluid level posterior to the heart consistent with a large hiatal or para esophageal hernia.  Aortic atherosclerosis is stable.  The lungs are clear.  There is no pleural effusion or pneumothorax.  Multiple thoracolumbar compression deformities are noted, possibly slightly progressive inferiorly.  IMPRESSION: Interval enlargement of a hiatal or para esophageal hernia.  No evidence of pneumonia or significant pleural effusion.   Original Report Authenticated By: Carey Bullocks, M.D.   1. SOB (shortness of  breath)   2. Elevated brain natriuretic peptide (BNP) level     MDM   EKG NSR, no acute ischemic changes.  Trop negative.  BNP elevated at 1205-- this is likely causing the SOB.  All other labs largely WNL. Doubt ACS, PE, dissection, or other vascular compromise.  Pt given 40 Lasix in the ED.  Ambulated in the hall without difficulty and O2 sats maintained at 94%.  Rx lasix for the next 5 days.  FU with PCP and discuss this visit and med changes. Also mentioned to family that during medication reconciliation with the pharmacy pt is on plavix and nexium which should not be taken in combination-- i have advised family to speak with PCP at FU about this.  Discussed plan with pt and family, they agreed.  Return precautions advised.  Discussed with Dr. Rubin Payor who agrees with plan.   Garlon Hatchet, PA-C 02/15/13 2352  Garlon Hatchet, PA-C 02/15/13 [phone]

## 2013-02-16 DIAGNOSIS — Z0000 Encounter for general adult medical examination without abnormal findings: Secondary | ICD-10-CM | POA: Insufficient documentation

## 2013-02-16 DIAGNOSIS — R55 Syncope and collapse: Secondary | ICD-10-CM | POA: Insufficient documentation

## 2013-02-16 DIAGNOSIS — K5900 Constipation, unspecified: Secondary | ICD-10-CM | POA: Insufficient documentation

## 2013-02-16 DIAGNOSIS — G4700 Insomnia, unspecified: Secondary | ICD-10-CM | POA: Insufficient documentation

## 2013-02-16 LAB — HEMOCCULT GUIAC POC 1CARD (OFFICE)

## 2013-02-16 NOTE — Assessment & Plan Note (Signed)
I have personally reviewed the Medicare Annual Wellness questionnaire and have noted 1. The patient's medical and social history 2. Their use of alcohol, tobacco or illicit drugs 3. Their current medications and supplements 4. The patient's functional ability including ADL's, fall risks, home safety risks and hearing or visual impairment. 5. Diet and physical activity 6. Evidence for depression or mood disorders The patients weight, height, BMI have been recorded in the chart.  Hearing and vision has been addressed. I have made referrals, counseling and provided education to the patient based review of the above and I have provided the pt with a written personalized care plan for preventive services. See scanned questionairre. Advanced directives discussed: to bring me copy of advanced directive.  Daughter is HCPOA.  Reviewed preventative protocols and updated unless pt declined. Sent home with iFOB.  DRE with evidence of BPH today.  Check PSA next blood draw.

## 2013-02-16 NOTE — Assessment & Plan Note (Addendum)
Isolated episode last week in known h/o 2nd degree AVB. I suggested he return to see cardiologist Dr. Johney Frame.

## 2013-02-16 NOTE — Assessment & Plan Note (Signed)
Reviewed #s with patient - LDL elevated, recommended start lipitor given h/o stroke.

## 2013-02-16 NOTE — Assessment & Plan Note (Signed)
Recheck next blood work.  Known elevated PSA and BPH.  Denies LUTS.

## 2013-02-16 NOTE — Assessment & Plan Note (Signed)
Suggested trial of benadryl or melatonin OTC

## 2013-02-16 NOTE — Assessment & Plan Note (Signed)
With endorsed dark stools - stool card in office negative for occult blood.  Regardless, sent home with iFOB given plavix use.

## 2013-02-16 NOTE — Assessment & Plan Note (Signed)
Continue calcium/vit D and fosamax.

## 2013-02-16 NOTE — Assessment & Plan Note (Signed)
Endorsing elevated BP in am - will increase lisinopril to 5mg  bid and reasses. BP Readings from Last 3 Encounters:  02/15/13 133/91  02/15/13 150/100  01/17/13 138/90

## 2013-02-17 NOTE — ED Provider Notes (Signed)
Medical screening examination/treatment/procedure(s) were performed by non-physician practitioner and as supervising physician I was immediately available for consultation/collaboration.  Danna Sewell R. Kacie Huxtable, MD 02/17/13 1400 

## 2013-02-25 ENCOUNTER — Encounter: Payer: Self-pay | Admitting: Family Medicine

## 2013-02-25 ENCOUNTER — Telehealth: Payer: Self-pay | Admitting: Family Medicine

## 2013-02-25 NOTE — Telephone Encounter (Signed)
Pt seen 02/15/2013 with dyspnea - treated with 5 d lasix. Can we call for update and see how his shortness of breath is doing?  Did lasix help with this?

## 2013-02-26 NOTE — Telephone Encounter (Signed)
Spoke with patient's son and he advised that patient is doing MUCH better since the lasix. He said he has a lot more energy and no more SOB or swelling. He will call back if he notices it starting back up again.

## 2013-02-26 NOTE — Telephone Encounter (Signed)
Noted thanks °

## 2013-02-28 ENCOUNTER — Other Ambulatory Visit: Payer: Medicare Other

## 2013-02-28 DIAGNOSIS — R195 Other fecal abnormalities: Secondary | ICD-10-CM

## 2013-02-28 LAB — FECAL OCCULT BLOOD, IMMUNOCHEMICAL: Fecal Occult Bld: NEGATIVE

## 2013-03-02 ENCOUNTER — Encounter: Payer: Self-pay | Admitting: *Deleted

## 2013-03-06 ENCOUNTER — Other Ambulatory Visit: Payer: Self-pay | Admitting: Family Medicine

## 2013-03-06 NOTE — Telephone Encounter (Signed)
OK to refill

## 2013-03-07 ENCOUNTER — Other Ambulatory Visit: Payer: Self-pay | Admitting: Family Medicine

## 2013-03-07 NOTE — Telephone Encounter (Signed)
plz phone in. 

## 2013-03-07 NOTE — Telephone Encounter (Signed)
Rx called to pharmacy

## 2013-03-08 ENCOUNTER — Other Ambulatory Visit: Payer: Self-pay | Admitting: *Deleted

## 2013-03-08 MED ORDER — FUROSEMIDE 40 MG PO TABS: 40.0000 mg | ORAL_TABLET | Freq: Every day | ORAL | Status: DC | PRN

## 2013-03-08 NOTE — Telephone Encounter (Signed)
plz notify this was sent in - to use as needed for shortness of breath or leg swelling. Not to use more than 5 days in a row.

## 2013-03-08 NOTE — Telephone Encounter (Signed)
Patients sone notified.

## 2013-03-08 NOTE — Telephone Encounter (Signed)
Ok to refill? Daughter called and said patient is noticing stomach is getting "hard" again and she has noticed he is winded after talking and seems more tired than usual.

## 2013-03-19 ENCOUNTER — Encounter: Payer: Self-pay | Admitting: Neurology

## 2013-03-19 ENCOUNTER — Ambulatory Visit (INDEPENDENT_AMBULATORY_CARE_PROVIDER_SITE_OTHER): Payer: Medicare Other | Admitting: Neurology

## 2013-03-19 DIAGNOSIS — I6509 Occlusion and stenosis of unspecified vertebral artery: Secondary | ICD-10-CM

## 2013-03-19 NOTE — Patient Instructions (Addendum)
Continue Plavix for secondary stroke prevention with strict control of hypertension with blood pressure goal below 130/90 and lipids with LDL cholesterol goal below 100 mg percent. Check followup transcranial Doppler and carotid ultrasound study. Return for followup in 6 months with Heide Guile, NP

## 2013-03-19 NOTE — Progress Notes (Signed)
[facility] [address] [place]. [place] [address] [phone]       OFFICE FOLLOW-UP NOTE  [name] Date of Birth:  May 16, 1924 Medical Record Number:  [id number]   HPI: 79 year old Caucasian male with left pontine infarct secondary to high-grade proximal basilar/terminal left vertebral artery stenosis in September 2013. Vascular risk factors of intracranial stenosis, hypertension and hyperlipidemia. 03/19/2013 He returns for followup after last visit on 09/18/12. He is accompanied by his son who provides most of the history. Patient appears to be extremely hard of hearing. He recently underwent removal of basal cell carcinoma from the left ear in March of 2014. He was also seen in the emergency room for shortness of breath and diagnosed with congestive heart failure. He was placed on diuretic for a week which seemed to help. History and has decreased energy, shortness of breath and leg swelling. She continues to have mild dizziness off and on and has fallen down once. He denies any ongoing new focal neurological symptoms suggestive of stroke. He remains on Plavix and seems to tolerate intake well. He states his blood pressure is under good control though it is slightly elevated in the office today at 151/97. ROS:   14 system review of systems is positive for fatigue, leg swelling, hearing loss, trouble swallowing, shortness of breath, constipation, allergies, dizziness, passing out, headache, not enough sleep, decreased energy and appetite.  PMH:  Past Medical History  Diagnosis Date  . GERD (gastroesophageal reflux disease) 08/1996  . Hyperlipemia 08/1986  . Hypertension Pre 1996  . Osteoporosis 12/2004    dexa T -2.8 (05/2012), T11 compression fx  . Pneumonia 22 yoa  . History of Doppler echocardiogram 05/1995    MVP, MOD AI, MILD TR, MILD MR  . Elevated PSA     decided against further eval  . Diverticulosis   . Bilateral sensorineural hearing loss 02/2012    some conductive left side  . Compression fracture     multiple  . AV block, 2nd degree 04/2012    seen cards, monitor for now, pt does not desire monitor/pacer  . BCC (basal cell carcinoma of skin) 08/2012    s/p excision Christopher [name])  . HOH (hard of hearing)   . Mental disorder   . Depression   . Chronic kidney disease     elevated PSA-no tx  . CVA (cerebral infarction) 04/20/2012    04/16/12 IMPRESSION:  1. 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 No mass effect or hemorrhage. Basilar artery perforator territory.  2. See MRA findings below.  3. Otherwise stable MRI appearance of the brain since 2011 including extensive perivascular spaces (etat crible appearance).      Social History:  History   Social History  . Marital Status: Single    Spouse Name: N/A    Number of Children: N/A  . Years of Education: N/A   Occupational History  . [employer]/Retired     Parttime delivers flowers   Social History Main Topics  . Smoking status: Former Games developer  . Smokeless tobacco: Former Neurosurgeon     Comment: smoked minimal as teen  . Alcohol Use: No  . Drug Use: No  . Sexually Active: No   Other Topics Concern  . Not on file   Social History Narrative   Caffeine: 2-3 cups/day, 1 gallon iced tea   Lives alone.  Widower.  2 dogs at home.   Lots of driving.   Activity: walking -  67min-1hr daily   Diet: some water, vegetables daily, occasional fruits    Medications:   Current Outpatient Prescriptions on File Prior to Visit  Medication Sig Dispense Refill  . acetaminophen (TYLENOL) 500 MG tablet Take 1,000 mg by mouth 4 (four) times daily. For pain      . alendronate (FOSAMAX) 70 MG tablet Take 70 mg by mouth every 7 (seven) days. Take with a full glass of water on an empty stomach.      Marland Kitchen atorvastatin (LIPITOR) 10 MG tablet Take 10 mg by mouth daily.      . Cholecalciferol (VITAMIN D) 1000 UNITS capsule Take 1,000 Units by mouth every evening.       .  clopidogrel (PLAVIX) 75 MG tablet Take 75 mg by mouth daily.      Marland Kitchen esomeprazole (NEXIUM) 40 MG capsule Take 40 mg by mouth daily.      . Flaxseed, Linseed, (FLAX SEED OIL) 1000 MG CAPS Take 1 capsule by mouth every evening.       . furosemide (LASIX) 40 MG tablet Take 1 tablet (40 mg total) by mouth daily as needed for fluid (shortness of breath).  30 tablet  0  . hydrochlorothiazide (MICROZIDE) 12.5 MG capsule Take 12.5 mg by mouth daily.      Marland Kitchen lisinopril (PRINIVIL,ZESTRIL) 5 MG tablet Take 5 mg by mouth 2 (two) times daily.      Marland Kitchen loratadine (CLARITIN) 10 MG tablet Take 10 mg by mouth daily.      . Multiple Vitamin (MULTIVITAMIN WITH MINERALS) TABS Take 1 tablet by mouth daily.      . potassium chloride SA (K-DUR,KLOR-CON) 20 MEQ tablet Take 20 mEq by mouth daily.       . sertraline (ZOLOFT) 25 MG tablet Take 25 mg by mouth daily.      . traMADol (ULTRAM) 50 MG tablet TAKE 1 TABLET (50 MG TOTAL) BY MOUTH 2 (TWO) TIMES DAILY AS NEEDED FOR PAIN.  60 tablet  1   No current facility-administered medications on file prior to visit.    Allergies:  No Known Allergies Filed Vitals:   03/19/13 1411  BP: 151/97  Pulse: 79    Physical Exam General: well developed, well nourished, seated, in no evident distress Head: head normocephalic and atraumatic. Orohparynx benign Neck: supple with no carotid or supraclavicular bruits Cardiovascular: regular rate and rhythm, no murmurs Musculoskeletal: no deformity Skin:  no rash/petichiae. 1+ pedal edema Vascular:  Normal pulses all extremities  Neurologic Exam Mental Status: Awake and fully alert. Oriented to place and time. Recent and remote memory intact. Attention span, concentration and fund of knowledge appropriate. Mood and affect appropriate.  Cranial Nerves: Fundoscopic exam reveals sharp disc margins. Pupils equal, briskly reactive to light. Extraocular movements full without nystagmus. Visual fields full to confrontation. Hearing is  diminished bilaterally. Facial sensation intact. Face, tongue, palate moves normally and symmetrically.  Motor: Normal bulk and tone. Normal strength in all tested extremity muscles. Mildly diminished fine finger movements on the right. Orbits left-to-right upper extremity. Sensory.: intact to tough and pinprick and vibratory.  Coordination: Rapid alternating movements normal in all extremities. Finger-to-nose and heel-to-shin performed accurately bilaterally. Gait and Station: Arises from chair without difficulty. Stance is normal. Gait demonstrates normal stride length and balance . Able to heel, toe and tandem walk without difficulty.  Reflexes: 1+ and symmetric. Toes downgoing.   NIHSS  0 Modified Rankin  2  ASSESSMENT: 79 year old Caucasian male with left pontine infarct secondary to  high-grade proximal basilar/terminal left vertebral artery stenosis in September 2013. Vascular risk factors of intracranial stenosis, hypertension and hyperlipidemia. Patient has done well on medical therapy.    PLAN: Continue Plavix for secondary stroke prevention with strict control of hypertension with blood pressure goal below 130/90 and lipids with LDL cholesterol goal below 100 mg percent. Check followup transcranial Doppler and carotid ultrasound study. Return for followup in 6 months with Heide Guile, NP

## 2013-03-29 ENCOUNTER — Ambulatory Visit (INDEPENDENT_AMBULATORY_CARE_PROVIDER_SITE_OTHER): Payer: Medicare Other

## 2013-03-29 DIAGNOSIS — I6350 Cerebral infarction due to unspecified occlusion or stenosis of unspecified cerebral artery: Secondary | ICD-10-CM

## 2013-03-29 DIAGNOSIS — Z0289 Encounter for other administrative examinations: Secondary | ICD-10-CM

## 2013-04-05 ENCOUNTER — Other Ambulatory Visit: Payer: Self-pay | Admitting: Family Medicine

## 2013-04-05 NOTE — Telephone Encounter (Signed)
Ok to refill 

## 2013-04-06 NOTE — Telephone Encounter (Signed)
[facility] called [facility] 

## 2013-04-06 NOTE — Telephone Encounter (Signed)
plz phone in. 

## 2013-04-24 ENCOUNTER — Telehealth: Payer: Self-pay

## 2013-04-24 NOTE — Telephone Encounter (Signed)
Christopher [name] pts daughter left v/m; for last few weeks pt said all food tastes bitter. Pt appetite decreased slightly. No nausea or vomiting or fever. Few days has diarrhea on and off. Is pt taking any meds that might cause food to be bitter? Request cb to pts son Christopher [name] [phone].

## 2013-04-25 NOTE — Telephone Encounter (Signed)
I would hold fosamax for next few weeks to see if any improvement - and update me . No other meds should really have that side effect. If not better, return to see me.

## 2013-04-25 NOTE — Telephone Encounter (Signed)
Patient's son notified. He will have patient hold fosamax x 6 weeks and return for OV if no better.

## 2013-04-29 ENCOUNTER — Other Ambulatory Visit: Payer: Self-pay | Admitting: Family Medicine

## 2013-05-09 ENCOUNTER — Encounter: Payer: Self-pay | Admitting: Radiology

## 2013-05-09 HISTORY — PX: US ECHOCARDIOGRAPHY: HXRAD669

## 2013-05-10 ENCOUNTER — Ambulatory Visit (INDEPENDENT_AMBULATORY_CARE_PROVIDER_SITE_OTHER): Payer: Medicare Other | Admitting: Family Medicine

## 2013-05-10 ENCOUNTER — Encounter: Payer: Self-pay | Admitting: Family Medicine

## 2013-05-10 VITALS — BP 136/84 | HR 96 | Temp 98.5°F | Wt 172.8 lb

## 2013-05-10 DIAGNOSIS — R0609 Other forms of dyspnea: Secondary | ICD-10-CM

## 2013-05-10 DIAGNOSIS — I10 Essential (primary) hypertension: Secondary | ICD-10-CM

## 2013-05-10 DIAGNOSIS — Z23 Encounter for immunization: Secondary | ICD-10-CM

## 2013-05-10 MED ORDER — FUROSEMIDE 20 MG PO TABS: 20.0000 mg | ORAL_TABLET | Freq: Every day | ORAL | Status: DC

## 2013-05-10 NOTE — Patient Instructions (Addendum)
Flu shot today. May take plain mucinex or immediate release guaifenesin. Start lasix lower dose of 20mg  daily for shortness of breath.  Return in 1 week to check kidneys on new medicine. I'd like to schedule ultrasound of heart given worsening shortness of breath.   If lasix does not help this, I will recommend you see Dr. Tillie Rung sooner than next year. Script for bp cuff provided today. Please return to see me if any worsening, update me if not improving with above.

## 2013-05-10 NOTE — Assessment & Plan Note (Addendum)
CXR from 02/2013 reviewed.  EKG from 02/2013 reviewed as well.  ER visit records reviewed. In setting of recent elevated BNP and worsening DOE, concern for new onset CHF.   Last echo from 04/2012 with normal EF and overall normal heart function. Given worsening dyspnea, I will order rpt echocardiogram, start lasix 20mg  daily, and have pt return in 1 week to check Cr on new daily lasix dose. If unrevealing w/u, will recommend return to Dr. Tillie Rung for further eval.  Pt agrees with plan.

## 2013-05-10 NOTE — Addendum Note (Signed)
Addended by: Josph Macho A on: 05/10/2013 12:22 PM   Modules accepted: Orders

## 2013-05-10 NOTE — Assessment & Plan Note (Signed)
Script for bp cuff provided today.

## 2013-05-10 NOTE — Progress Notes (Signed)
  Subjective:    Patient ID: Christopher [name], male    DOB: [birth year], 79 y.o.   MRN: [id number]  HPI CC: DOE  Pleasant 79yo with h/o 2nd degree AV block planned monitoring without intervention, CVA 04/2012 and HTN, HLD, GERD presents with significant increasing dyspnea with minimal exertion (ie showering).  This has been present for the last 3 months.  Occasional ankle swelling - has been using lasix 40mg  prn for this.  Rarely takes though. Denies increased heart rate or palpitations with this.  Denies chest pain/tightness.  No significant orthopnea.   Sleeps on one pillow, but does have hospital bed on slight incline. Mild cough and congestion.  Some wheeze.  Recent ER eval 77/2014 for this dyspnea, treated with lasix 40mg  x 5 days and sxs improved.  BNP at that time 1205, acute increase from prior 56 a few months back.  Bitter taste of food - recommended stop fosamax and sxs have improved some. Using OTC med to help him sleep - seems to be helping.  Past Medical History  Diagnosis Date  . GERD (gastroesophageal reflux disease) 08/1996  . Hyperlipemia 08/1986  . Hypertension Pre 1996  . Osteoporosis 12/2004    dexa T -2.8 (05/2012), T11 compression fx  . Pneumonia 22 yoa  . History of Doppler echocardiogram 05/1995    MVP, MOD AI, MILD TR, MILD MR  . Elevated PSA     decided against further eval  . Diverticulosis   . Bilateral sensorineural hearing loss 02/2012    some conductive left side  . Compression fracture     multiple  . AV block, 2nd degree 04/2012    seen cards, monitor for now, pt does not desire monitor/pacer  . BCC (basal cell carcinoma of skin) 08/2012    s/p excision Terri Piedra)  . HOH (hard of hearing)   . Mental disorder   . Depression   . Chronic kidney disease     elevated PSA-no tx  . CVA (cerebral infarction) 04/20/2012    04/16/12 IMPRESSION:  1. 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 No mass effect or hemorrhage. Basilar artery  perforator territory.  2. See MRA findings below.  3. Otherwise stable MRI appearance of the brain since 2011 including extensive perivascular spaces (etat crible appearance).      Review of Systems Per HPI    Objective:   Physical Exam  Nursing note and vitals reviewed. Constitutional: He appears well-developed and well-nourished. No distress.  HENT:  Mouth/Throat: Oropharynx is clear and moist. No oropharyngeal exudate.  Neck: Normal range of motion. Neck supple. No hepatojugular reflux and no JVD present.  Cardiovascular: Normal rate, regular rhythm, normal heart sounds and intact distal pulses.   No murmur heard. Pulmonary/Chest: Effort normal and breath sounds normal. No respiratory distress. He has no wheezes. He has no rales.  Crackles LLL  Musculoskeletal: He exhibits no edema.  Lymphadenopathy:    He has no cervical adenopathy.       Assessment & Plan:

## 2013-05-11 ENCOUNTER — Ambulatory Visit: Payer: Medicare Other | Admitting: Family Medicine

## 2013-05-14 ENCOUNTER — Ambulatory Visit: Payer: Medicare Other | Admitting: Family Medicine

## 2013-05-17 ENCOUNTER — Other Ambulatory Visit (INDEPENDENT_AMBULATORY_CARE_PROVIDER_SITE_OTHER): Payer: Medicare Other

## 2013-05-17 DIAGNOSIS — I10 Essential (primary) hypertension: Secondary | ICD-10-CM

## 2013-05-17 DIAGNOSIS — R0609 Other forms of dyspnea: Secondary | ICD-10-CM

## 2013-05-17 LAB — BASIC METABOLIC PANEL
BUN: 26 mg/dL — ABNORMAL HIGH (ref 6–23)
Calcium: 9.4 mg/dL (ref 8.4–10.5)
Chloride: 105 mEq/L (ref 96–112)
Creatinine, Ser: 1.2 mg/dL (ref 0.4–1.5)
GFR: 58.6 mL/min — ABNORMAL LOW (ref >60.00)
Glucose, Bld: 115 mg/dL — ABNORMAL HIGH (ref 70–99)
Potassium: 3.6 mEq/L (ref 3.5–5.1)

## 2013-05-17 LAB — BRAIN NATRIURETIC PEPTIDE: Pro B Natriuretic peptide (BNP): 31 pg/mL (ref 0.0–100.0)

## 2013-05-18 ENCOUNTER — Encounter: Payer: Self-pay | Admitting: *Deleted

## 2013-05-23 ENCOUNTER — Ambulatory Visit (HOSPITAL_COMMUNITY): Payer: Medicare Other | Attending: Family Medicine | Admitting: Cardiology

## 2013-05-23 DIAGNOSIS — R0609 Other forms of dyspnea: Secondary | ICD-10-CM

## 2013-05-23 DIAGNOSIS — Z8673 Personal history of transient ischemic attack (TIA), and cerebral infarction without residual deficits: Secondary | ICD-10-CM | POA: Insufficient documentation

## 2013-05-23 DIAGNOSIS — I10 Essential (primary) hypertension: Secondary | ICD-10-CM | POA: Insufficient documentation

## 2013-05-23 DIAGNOSIS — E785 Hyperlipidemia, unspecified: Secondary | ICD-10-CM | POA: Insufficient documentation

## 2013-05-23 DIAGNOSIS — R0602 Shortness of breath: Secondary | ICD-10-CM | POA: Insufficient documentation

## 2013-05-23 DIAGNOSIS — Z87891 Personal history of nicotine dependence: Secondary | ICD-10-CM | POA: Insufficient documentation

## 2013-05-23 DIAGNOSIS — I080 Rheumatic disorders of both mitral and aortic valves: Secondary | ICD-10-CM | POA: Insufficient documentation

## 2013-05-23 NOTE — Progress Notes (Signed)
Echo performed. 

## 2013-05-24 ENCOUNTER — Other Ambulatory Visit: Payer: Self-pay | Admitting: Family Medicine

## 2013-05-27 ENCOUNTER — Encounter: Payer: Self-pay | Admitting: Family Medicine

## 2013-05-27 ENCOUNTER — Other Ambulatory Visit: Payer: Self-pay | Admitting: Family Medicine

## 2013-05-27 DIAGNOSIS — M810 Age-related osteoporosis without current pathological fracture: Secondary | ICD-10-CM

## 2013-05-29 ENCOUNTER — Other Ambulatory Visit: Payer: Medicare Other

## 2013-06-07 ENCOUNTER — Other Ambulatory Visit: Payer: Self-pay | Admitting: Family Medicine

## 2013-06-07 NOTE — Telephone Encounter (Signed)
plz phone in. 

## 2013-06-07 NOTE — Telephone Encounter (Signed)
Rx called in as directed.   

## 2013-06-12 ENCOUNTER — Emergency Department (HOSPITAL_COMMUNITY): Payer: Medicare Other

## 2013-06-12 ENCOUNTER — Encounter (HOSPITAL_COMMUNITY): Payer: Self-pay | Admitting: Emergency Medicine

## 2013-06-12 ENCOUNTER — Observation Stay (HOSPITAL_COMMUNITY): Payer: Medicare Other

## 2013-06-12 ENCOUNTER — Inpatient Hospital Stay (HOSPITAL_COMMUNITY)
Admission: EM | Admit: 2013-06-12 | Discharge: 2013-06-18 | DRG: 183 | Disposition: A | Discharge status: Skilled Nursing Facility | Payer: Medicare Other | Attending: General Surgery | Admitting: General Surgery

## 2013-06-12 DIAGNOSIS — K449 Diaphragmatic hernia without obstruction or gangrene: Secondary | ICD-10-CM

## 2013-06-12 DIAGNOSIS — S2249XA Multiple fractures of ribs, unspecified side, initial encounter for closed fracture: Secondary | ICD-10-CM

## 2013-06-12 DIAGNOSIS — Y92009 Unspecified place in unspecified non-institutional (private) residence as the place of occurrence of the external cause: Secondary | ICD-10-CM

## 2013-06-12 DIAGNOSIS — E785 Hyperlipidemia, unspecified: Secondary | ICD-10-CM | POA: Diagnosis present

## 2013-06-12 DIAGNOSIS — Z85828 Personal history of other malignant neoplasm of skin: Secondary | ICD-10-CM

## 2013-06-12 DIAGNOSIS — R0902 Hypoxemia: Secondary | ICD-10-CM | POA: Diagnosis not present

## 2013-06-12 DIAGNOSIS — W010XXA Fall on same level from slipping, tripping and stumbling without subsequent striking against object, initial encounter: Secondary | ICD-10-CM | POA: Diagnosis present

## 2013-06-12 DIAGNOSIS — N189 Chronic kidney disease, unspecified: Secondary | ICD-10-CM | POA: Diagnosis present

## 2013-06-12 DIAGNOSIS — W19XXXA Unspecified fall, initial encounter: Secondary | ICD-10-CM

## 2013-06-12 DIAGNOSIS — S2241XA Multiple fractures of ribs, right side, initial encounter for closed fracture: Secondary | ICD-10-CM

## 2013-06-12 DIAGNOSIS — I441 Atrioventricular block, second degree: Secondary | ICD-10-CM | POA: Diagnosis present

## 2013-06-12 DIAGNOSIS — Z8249 Family history of ischemic heart disease and other diseases of the circulatory system: Secondary | ICD-10-CM

## 2013-06-12 DIAGNOSIS — M810 Age-related osteoporosis without current pathological fracture: Secondary | ICD-10-CM | POA: Diagnosis present

## 2013-06-12 DIAGNOSIS — K219 Gastro-esophageal reflux disease without esophagitis: Secondary | ICD-10-CM | POA: Diagnosis present

## 2013-06-12 DIAGNOSIS — H903 Sensorineural hearing loss, bilateral: Secondary | ICD-10-CM | POA: Diagnosis present

## 2013-06-12 DIAGNOSIS — I129 Hypertensive chronic kidney disease with stage 1 through stage 4 chronic kidney disease, or unspecified chronic kidney disease: Secondary | ICD-10-CM | POA: Diagnosis present

## 2013-06-12 DIAGNOSIS — S272XXA Traumatic hemopneumothorax, initial encounter: Secondary | ICD-10-CM

## 2013-06-12 DIAGNOSIS — G4700 Insomnia, unspecified: Secondary | ICD-10-CM | POA: Diagnosis present

## 2013-06-12 DIAGNOSIS — Z79899 Other long term (current) drug therapy: Secondary | ICD-10-CM

## 2013-06-12 DIAGNOSIS — Z8673 Personal history of transient ischemic attack (TIA), and cerebral infarction without residual deficits: Secondary | ICD-10-CM

## 2013-06-12 DIAGNOSIS — F329 Major depressive disorder, single episode, unspecified: Secondary | ICD-10-CM | POA: Diagnosis present

## 2013-06-12 DIAGNOSIS — Z823 Family history of stroke: Secondary | ICD-10-CM

## 2013-06-12 DIAGNOSIS — F3289 Other specified depressive episodes: Secondary | ICD-10-CM | POA: Diagnosis present

## 2013-06-12 DIAGNOSIS — Z87891 Personal history of nicotine dependence: Secondary | ICD-10-CM

## 2013-06-12 HISTORY — DX: Unspecified place in unspecified non-institutional (private) residence as the place of occurrence of the external cause: Y92.009

## 2013-06-12 HISTORY — DX: Unspecified place in unspecified non-institutional (private) residence as the place of occurrence of the external cause: W19.XXXA

## 2013-06-12 HISTORY — DX: Unspecified fall, initial encounter: W19.XXXA

## 2013-06-12 LAB — SAMPLE TO BLOOD BANK

## 2013-06-12 LAB — CREATININE, SERUM
Creatinine, Ser: 1.02 mg/dL (ref 0.50–1.35)
GFR calc non Af Amer: 64 mL/min — ABNORMAL LOW (ref >90)

## 2013-06-12 LAB — BASIC METABOLIC PANEL
BUN: 24 mg/dL — ABNORMAL HIGH (ref 6–23)
Calcium: 9.5 mg/dL (ref 8.4–10.5)
Chloride: 101 mEq/L (ref 96–112)
GFR calc non Af Amer: 56 mL/min — ABNORMAL LOW (ref >90)
Glucose, Bld: 173 mg/dL — ABNORMAL HIGH (ref 70–99)
Potassium: 4.3 mEq/L (ref 3.5–5.1)
Sodium: 136 mEq/L (ref 135–145)

## 2013-06-12 LAB — CBC WITH DIFFERENTIAL/PLATELET
Eosinophils Absolute: 0.3 10*3/uL (ref 0.0–0.7)
Hemoglobin: 15.4 g/dL (ref 13.0–17.0)
Lymphocytes Relative: 17 % (ref 12–46)
Lymphs Abs: 2 10*3/uL (ref 0.7–4.0)
MCH: 33.6 pg (ref 26.0–34.0)
Monocytes Relative: 8 % (ref 3–12)
Neutro Abs: 8.6 10*3/uL — ABNORMAL HIGH (ref 1.7–7.7)
Neutrophils Relative %: 73 % (ref 43–77)
Platelets: 190 10*3/uL (ref 150–400)
RBC: 4.58 MIL/uL (ref 4.22–5.81)
WBC: 11.8 10*3/uL — ABNORMAL HIGH (ref 4.0–10.5)

## 2013-06-12 LAB — CBC
Hemoglobin: 14.3 g/dL (ref 13.0–17.0)
MCH: 32.9 pg (ref 26.0–34.0)
MCHC: 34.9 g/dL (ref 30.0–36.0)
Platelets: 166 10*3/uL (ref 150–400)
RBC: 4.35 MIL/uL (ref 4.22–5.81)
RDW: 13.6 % (ref 11.5–15.5)

## 2013-06-12 LAB — PROTIME-INR
INR: 1.01 (ref 0.00–1.49)
Prothrombin Time: 13.1 seconds (ref 11.6–15.2)

## 2013-06-12 MED ORDER — ONDANSETRON HCL 4 MG PO TABS: 4.0000 mg | ORAL_TABLET | Freq: Four times a day (QID) | ORAL | Status: DC | PRN

## 2013-06-12 MED ORDER — SODIUM CHLORIDE 0.9 % IV SOLN
INTRAVENOUS | Status: DC
  Administered 2013-06-12 (×2): via INTRAVENOUS

## 2013-06-12 MED ORDER — IPRATROPIUM BROMIDE 0.02 % IN SOLN: 0.5000 mg | Freq: Three times a day (TID) | RESPIRATORY_TRACT | Status: DC

## 2013-06-12 MED ORDER — IOHEXOL 300 MG/ML  SOLN
75.0000 mL | Freq: Once | INTRAMUSCULAR | Status: AC | PRN
  Administered 2013-06-12: 75 mL via INTRAVENOUS

## 2013-06-12 MED ORDER — ENOXAPARIN SODIUM 30 MG/0.3ML ~~LOC~~ SOLN
30.0000 mg | Freq: Two times a day (BID) | SUBCUTANEOUS | Status: DC
  Administered 2013-06-12 – 2013-06-13 (×2): 30 mg via SUBCUTANEOUS
  Filled 2013-06-12 (×4): qty 0.3

## 2013-06-12 MED ORDER — PANTOPRAZOLE SODIUM 40 MG IV SOLR
40.0000 mg | Freq: Every day | INTRAVENOUS | Status: DC
  Filled 2013-06-12: qty 40

## 2013-06-12 MED ORDER — PANTOPRAZOLE SODIUM 40 MG PO TBEC
40.0000 mg | DELAYED_RELEASE_TABLET | Freq: Every day | ORAL | Status: DC
  Administered 2013-06-12: 40 mg via ORAL
  Filled 2013-06-12: qty 1

## 2013-06-12 MED ORDER — SERTRALINE HCL 50 MG PO TABS
25.0000 mg | ORAL_TABLET | Freq: Every day | ORAL | Status: DC
  Administered 2013-06-12 – 2013-06-18 (×7): 25 mg via ORAL
  Filled 2013-06-12 (×7): qty 0.5

## 2013-06-12 MED ORDER — ENSURE COMPLETE PO LIQD
237.0000 mL | Freq: Two times a day (BID) | ORAL | Status: DC
  Administered 2013-06-12 – 2013-06-17 (×11): 237 mL via ORAL

## 2013-06-12 MED ORDER — ALBUTEROL SULFATE (5 MG/ML) 0.5% IN NEBU: 2.5000 mg | INHALATION_SOLUTION | Freq: Three times a day (TID) | RESPIRATORY_TRACT | Status: DC

## 2013-06-12 MED ORDER — MORPHINE SULFATE 2 MG/ML IJ SOLN
2.0000 mg | Freq: Once | INTRAMUSCULAR | Status: AC
  Administered 2013-06-12: 2 mg via INTRAVENOUS
  Filled 2013-06-12: qty 1

## 2013-06-12 MED ORDER — LORATADINE 10 MG PO TABS
10.0000 mg | ORAL_TABLET | Freq: Every day | ORAL | Status: DC
  Administered 2013-06-12 – 2013-06-18 (×7): 10 mg via ORAL
  Filled 2013-06-12 (×7): qty 1

## 2013-06-12 MED ORDER — LISINOPRIL 10 MG PO TABS
5.0000 mg | ORAL_TABLET | Freq: Two times a day (BID) | ORAL | Status: DC
  Administered 2013-06-12 – 2013-06-18 (×7): 5 mg via ORAL
  Filled 2013-06-12 (×14): qty 0.5

## 2013-06-12 MED ORDER — IPRATROPIUM BROMIDE 0.02 % IN SOLN
0.5000 mg | Freq: Three times a day (TID) | RESPIRATORY_TRACT | Status: DC
  Administered 2013-06-13: 0.5 mg via RESPIRATORY_TRACT
  Filled 2013-06-12: qty 2.5

## 2013-06-12 MED ORDER — ENOXAPARIN SODIUM 40 MG/0.4ML ~~LOC~~ SOLN: 40.0000 mg | SUBCUTANEOUS | Status: DC

## 2013-06-12 MED ORDER — BISACODYL 10 MG RE SUPP: 10.0000 mg | Freq: Every day | RECTAL | Status: DC | PRN

## 2013-06-12 MED ORDER — MORPHINE SULFATE 2 MG/ML IJ SOLN
2.0000 mg | INTRAMUSCULAR | Status: DC | PRN
  Administered 2013-06-12: 2 mg via INTRAVENOUS
  Filled 2013-06-12: qty 1

## 2013-06-12 MED ORDER — ACETAMINOPHEN 325 MG PO TABS
650.0000 mg | ORAL_TABLET | ORAL | Status: DC | PRN
  Administered 2013-06-16: 650 mg via ORAL
  Filled 2013-06-12: qty 2

## 2013-06-12 MED ORDER — IPRATROPIUM BROMIDE 0.02 % IN SOLN
0.5000 mg | Freq: Four times a day (QID) | RESPIRATORY_TRACT | Status: DC
  Administered 2013-06-12 (×2): 0.5 mg via RESPIRATORY_TRACT
  Filled 2013-06-12 (×2): qty 2.5

## 2013-06-12 MED ORDER — ALBUTEROL SULFATE (5 MG/ML) 0.5% IN NEBU
2.5000 mg | INHALATION_SOLUTION | Freq: Three times a day (TID) | RESPIRATORY_TRACT | Status: DC
  Administered 2013-06-13: 2.5 mg via RESPIRATORY_TRACT
  Filled 2013-06-12: qty 0.5

## 2013-06-12 MED ORDER — MORPHINE SULFATE 4 MG/ML IJ SOLN
4.0000 mg | Freq: Once | INTRAMUSCULAR | Status: AC
  Administered 2013-06-12: 4 mg via INTRAVENOUS
  Filled 2013-06-12: qty 1

## 2013-06-12 MED ORDER — HYDROCODONE-ACETAMINOPHEN 5-325 MG PO TABS
2.0000 | ORAL_TABLET | ORAL | Status: DC | PRN
  Administered 2013-06-12 (×3): 2 via ORAL
  Filled 2013-06-12 (×3): qty 2

## 2013-06-12 MED ORDER — ALBUTEROL SULFATE (5 MG/ML) 0.5% IN NEBU: 2.5000 mg | INHALATION_SOLUTION | Freq: Four times a day (QID) | RESPIRATORY_TRACT | Status: DC

## 2013-06-12 MED ORDER — TRAMADOL HCL 50 MG PO TABS: 50.0000 mg | ORAL_TABLET | Freq: Two times a day (BID) | ORAL | Status: DC | PRN

## 2013-06-12 MED ORDER — MORPHINE SULFATE 4 MG/ML IJ SOLN: 4.0000 mg | Freq: Once | INTRAMUSCULAR | Status: DC

## 2013-06-12 MED ORDER — ALBUTEROL SULFATE (5 MG/ML) 0.5% IN NEBU: 2.5000 mg | INHALATION_SOLUTION | RESPIRATORY_TRACT | Status: DC | PRN

## 2013-06-12 MED ORDER — ONDANSETRON HCL 4 MG/2ML IJ SOLN
4.0000 mg | Freq: Four times a day (QID) | INTRAMUSCULAR | Status: DC | PRN
  Administered 2013-06-14: 4 mg via INTRAVENOUS
  Filled 2013-06-12: qty 2

## 2013-06-12 MED ORDER — HYDROCHLOROTHIAZIDE 12.5 MG PO CAPS
12.5000 mg | ORAL_CAPSULE | Freq: Every day | ORAL | Status: DC
  Administered 2013-06-12 – 2013-06-18 (×5): 12.5 mg via ORAL
  Filled 2013-06-12 (×7): qty 1

## 2013-06-12 MED ORDER — HYDROCODONE-ACETAMINOPHEN 5-325 MG PO TABS
1.0000 | ORAL_TABLET | ORAL | Status: DC | PRN
  Administered 2013-06-12 – 2013-06-13 (×2): 1 via ORAL
  Filled 2013-06-12 (×2): qty 1

## 2013-06-12 MED ORDER — DOCUSATE SODIUM 100 MG PO CAPS
100.0000 mg | ORAL_CAPSULE | Freq: Two times a day (BID) | ORAL | Status: DC
  Administered 2013-06-12 – 2013-06-17 (×10): 100 mg via ORAL
  Filled 2013-06-12 (×14): qty 1

## 2013-06-12 MED ORDER — ALBUTEROL SULFATE (5 MG/ML) 0.5% IN NEBU
2.5000 mg | INHALATION_SOLUTION | Freq: Four times a day (QID) | RESPIRATORY_TRACT | Status: DC
  Administered 2013-06-12 (×2): 2.5 mg via RESPIRATORY_TRACT
  Filled 2013-06-12 (×2): qty 0.5

## 2013-06-12 NOTE — Evaluation (Signed)
Physical Therapy Evaluation Patient Details Name: Christopher [name] MRN: [id number] DOB: 11-Jun-1924 Today's Date: 06/12/2013 Time: 4098-1191 PT Time Calculation (min): 27 min  PT Assessment / Plan / Recommendation History of Present Illness   Pt is an 79 year old male admitted after fall in shower at home resulting in small volume of pneumothorax and acute fractures of the posterolateral aspects of the right 8th and 9th ribs, as well as the posterior aspect of the right 10th rib   Clinical Impression  Pt admitted with above. Pt currently with functional limitations due to the deficits listed below (see PT Problem List).  Pt will benefit from skilled PT to increase their independence and safety with mobility to allow discharge to the venue listed below.  Pt reports he lives alone and uncertain of available help upon d/c home so recommend ST-SNF at this time.  Pt reports he fell in shower as he was attempting to stand on one leg even though he has shower chair.     PT Assessment  Patient needs continued PT services    Follow Up Recommendations  Supervision for mobility/OOB;SNF    Does the patient have the potential to tolerate intense rehabilitation      Barriers to Discharge        Equipment Recommendations  None recommended by PT    Recommendations for Other Services     Frequency Min 3X/week    Precautions / Restrictions Precautions Precautions: Fall Precaution Comments: monitor sats   Pertinent Vitals/Pain Pt maintained on 3L O2 [place] throughout session SaO2 93% 3L O2 at rest SaO2 91% 3L O2 upon return to recliner after ambulation Pt reports pain 8/10 R flank upon entering room and RN notified and brought pain meds, pt states pain did not increase with ambulation and he wished to sit up in recliner upon return to room      Mobility  Bed Mobility Bed Mobility: Left Sidelying to Sit;Rolling Left Rolling Left: With rail;5: Supervision Left Sidelying to Sit: 4: Min assist;With  rails Details for Bed Mobility Assistance: verbal cues for log roll technique for pain management Transfers Transfers: Sit to Stand;Stand to Sit Sit to Stand: 4: Min guard;With upper extremity assist;From bed Stand to Sit: 4: Min guard;With upper extremity assist;To chair/3-in-1 Details for Transfer Assistance: increased time due to pain, verbal cues for safe technique Ambulation/Gait Ambulation/Gait Assistance: 4: Min guard Ambulation Distance (Feet): 60 Feet Assistive device: Rolling walker Ambulation/Gait Assistance Details: verbal cues for RW distance, pt reports increased SOB so standing rest break required after 30 feet then ambulated back to room, maintained 3L O2 during gait Gait Pattern: Step-through pattern;Decreased stride length Gait velocity: decreased General Gait Details: occasional assist for manuvering RW when close to objects    Exercises     PT Diagnosis: Difficulty walking;Acute pain  PT Problem List: Decreased strength;Decreased activity tolerance;Decreased balance;Decreased mobility;Pain;Decreased knowledge of use of DME PT Treatment Interventions: DME instruction;Gait training;Functional mobility training;Therapeutic activities;Therapeutic exercise;Patient/family education     PT Goals(Current goals can be found in the care plan section) Acute Rehab PT Goals PT Goal Formulation: With patient Time For Goal Achievement: 06/26/13 Potential to Achieve Goals: Good  Visit Information  Last PT Received On: 06/26/13 Assistance Needed: +2 History of Present Illness:  Pt is an 79 year old male admitted after fall in shower at home resulting in small volume of pneumothorax and acute fractures of the posterolateral aspects of the right 8th and 9th ribs, as well as the posterior aspect  of the right 10th rib       Prior Functioning  Home Living Family/patient expects to be discharged to:: Private residence Living Arrangements: Alone Type of Home: House Home Access:   (threshold) Home Layout: One level Home Equipment: Walker - 2 wheels;Cane - single point;Shower seat Prior Function Level of Independence: Independent with assistive device(s) Comments: pt states he mostly uses SPC however also states using RW on "bad days" Communication Communication: [facility]    Cognition  Cognition Arousal/Alertness: Awake/alert Behavior During Therapy: WFL for tasks assessed/performed Memory: Decreased short-term memory (per pt report)    Extremity/Trunk Assessment Lower Extremity Assessment Lower Extremity Assessment: Generalized weakness   Balance    End of Session PT - End of Session Activity Tolerance: Patient limited by fatigue;Patient limited by pain Patient left: in chair;with call bell/phone within reach;Other (comment) (with RT) Nurse Communication: Mobility status;Other (comment) (pt up in recliner)  GP Functional Assessment Tool Used: clinical judgement Functional Limitation: Mobility: Walking and moving around Mobility: Walking and Moving Around Current Status [phone]): At least 20 percent but less than 40 percent impaired, limited or restricted Mobility: Walking and Moving Around Goal Status [phone]): At least 1 percent but less than 20 percent impaired, limited or restricted   Carrson Lightcap,KATHrine E 06/12/2013, 4:01 PM Zenovia Jarred, PT, DPT 06/12/2013 Pager: [phone]

## 2013-06-12 NOTE — ED Notes (Signed)
Report to Kim on 5 N.

## 2013-06-12 NOTE — ED Provider Notes (Signed)
CSN: [id number]     Arrival date & time 06/12/13  0009 History   First MD Initiated Contact with Patient 06/12/13 0012     Chief Complaint  Patient presents with  . Fall   (Consider location/radiation/quality/duration/timing/severity/associated sxs/prior Treatment) HPI 79 year old male presents to emergency department from home with complaint of fall.  Patient reports he was getting out of the tub when he slipped, landing on his right side on the edge of the patella.  Patient reports he was unable to stand after the fall, and had to crawl to the room to call 911.  Patient denies striking his head, no other injuries, no loss of consciousness.  He is complaining of significant pain to his right posterior chest.  EMS reports crepitus on their evaluation.  He has had decreased oxygen saturations.  Patient has history of osteoporosis, status post T11 compression fracture.  Patient also with history of chronic kidney disease, AV block, and CVA. Past Medical History  Diagnosis Date  . GERD (gastroesophageal reflux disease) 08/1996  . Hyperlipemia 08/1986  . Hypertension Pre 1996  . Osteoporosis 12/2004    dexa T -2.8 (05/2012), T11 compression fx  . Pneumonia 22 yoa  . History of Doppler echocardiogram 05/1995    MVP, MOD AI, MILD TR, MILD MR  . Elevated PSA     decided against further eval  . Diverticulosis   . Bilateral sensorineural hearing loss 02/2012    some conductive left side  . Compression fracture     multiple  . AV block, 2nd degree 04/2012    seen cards, monitor for now, pt does not desire monitor/pacer  . BCC (basal cell carcinoma of skin) 08/2012    s/p excision Terri Piedra)  . HOH (hard of hearing)   . Depression   . Chronic kidney disease     elevated PSA-no tx  . CVA (cerebral infarction) 04/20/2012    04/16/12 IMPRESSION:  1. 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 No mass effect or hemorrhage. Basilar artery perforator territory.  2. See MRA findings  below.  3. Otherwise stable MRI appearance of the brain since 2011 including extensive perivascular spaces (etat crible appearance).     Past Surgical History  Procedure Laterality Date  . Cholecystectomy  05/1995  . Exercise treadmill  09/12/2006    NML  . Inguinal hernia repair  08/14/2010    Dr. Daphine Deutscher  . Esophagogastroduodenoscopy  08/18/2004    H. H. Dr. Russella Dar  . Colonoscopy  05/01/2007    divericulosis, int hemorrhoids - Dr. Russella Dar  . Dexa  05/2012    T score -2.8 hip, T11 compression fx  . Skin full thickness graft Left 10/19/2012    Procedure: REPAIR OF LEFT EAR WITH TISSUE REARRANGEMENT SKIN GRAFT FULL THICKNESS;  Surgeon: Wayland Denis, DO;  Location: [facility];  Service: Plastics;  Laterality: Left;  . US echocardiography  05/2013    Mod LVH, EF 60-65%, indeterminate diastolic fxn, aortic sclerosis without stenosis, mildly dilated RV with nl sys fxn, mod dilated AA   Family History  Problem Relation Age of Onset  . Heart disease Mother     MI  . Hypertension Mother   . Stroke Mother     ?  . Stroke Father   . Diabetes Sister   . Hypertension Sister   . Hypertension Sister   . Hypertension Sister   . Stroke Sister     recurrent mini strokes  . Cancer Neg Hx  History  Substance Use Topics  . Smoking status: Former Games developer  . Smokeless tobacco: Former Neurosurgeon     Comment: smoked minimal as teen  . Alcohol Use: No    Review of Systems  All other systems reviewed and are negative.    Allergies  Review of patient's allergies indicates no known allergies.  Home Medications   Current Outpatient Rx  Name  Route  Sig  Dispense  Refill  . acetaminophen (TYLENOL) 500 MG tablet   Oral   Take 1,000 mg by mouth 4 (four) times daily. For pain         . atorvastatin (LIPITOR) 10 MG tablet   Oral   Take 10 mg by mouth daily.         . Cholecalciferol (VITAMIN D) 1000 UNITS capsule   Oral   Take 1,000 Units by mouth every evening.           . clopidogrel (PLAVIX) 75 MG tablet   Oral   Take 75 mg by mouth daily.         Marland Kitchen esomeprazole (NEXIUM) 40 MG capsule   Oral   Take 40 mg by mouth daily.         . Flaxseed, Linseed, (FLAX SEED OIL) 1000 MG CAPS   Oral   Take 1 capsule by mouth every evening.          . furosemide (LASIX) 20 MG tablet   Oral   Take 20 mg by mouth daily.    30 tablet   6   . hydrochlorothiazide (MICROZIDE) 12.5 MG capsule   Oral   Take 12.5 mg by mouth daily.         Marland Kitchen lisinopril (PRINIVIL,ZESTRIL) 5 MG tablet   Oral   Take 5 mg by mouth 2 (two) times daily.         Marland Kitchen loratadine (CLARITIN) 10 MG tablet   Oral   Take 10 mg by mouth daily.         . Melatonin 10 MG TABS   Oral   Take 1 tablet by mouth at bedtime.         . Multiple Vitamin (MULTIVITAMIN WITH MINERALS) TABS   Oral   Take 1 tablet by mouth daily.         . potassium chloride SA (K-DUR,KLOR-CON) 20 MEQ tablet   Oral   Take 20 mEq by mouth daily.          . sertraline (ZOLOFT) 25 MG tablet   Oral   Take 25 mg by mouth daily.         . traMADol (ULTRAM) 50 MG tablet   Oral   Take 50 mg by mouth 2 (two) times daily as needed for pain.          BP 121/91  Pulse 96  Temp(Src) 98.5 F (36.9 C) (Oral)  Resp 14  SpO2 95% Physical Exam  Constitutional: He is oriented to person, place, and time. He appears well-developed and well-nourished. He appears distressed.  HENT:  Head: Normocephalic and atraumatic.  Nose: Nose normal.  Mouth/Throat: Oropharynx is clear and moist.  Eyes: Conjunctivae and EOM are normal. Pupils are equal, round, and reactive to light.  Neck: Normal range of motion. Neck supple. No JVD present. No tracheal deviation present. No thyromegaly present.  Cardiovascular: Normal rate, regular rhythm, normal heart sounds and intact distal pulses.  Exam reveals no gallop and no friction rub.   No murmur heard. Pulmonary/Chest:  Effort normal and breath sounds normal. No stridor.  No respiratory distress. He has no wheezes. He has no rales. He exhibits tenderness (tenderness along right posterior chest wall.  Crepitus felt.  Palpation of the area causes pain).    Poor inspiratory effort secondary to pain  Abdominal: Soft. Bowel sounds are normal. He exhibits no distension and no mass. There is no tenderness. There is no rebound and no guarding.  Musculoskeletal: Normal range of motion. He exhibits no edema and no tenderness.  Lymphadenopathy:    He has no cervical adenopathy.  Neurological: He is oriented to person, place, and time. He exhibits normal muscle tone. Coordination normal.  Skin: Skin is warm and dry. No rash noted. No erythema. No pallor.  Psychiatric: He has a normal mood and affect. His behavior is normal. Judgment and thought content normal.    ED Course  Procedures (including critical care time) Labs Review Labs Reviewed  CBC WITH DIFFERENTIAL - Abnormal; Notable for the following:    WBC 11.8 (*)    Neutro Abs 8.6 (*)    All other components within normal limits  BASIC METABOLIC PANEL - Abnormal; Notable for the following:    Glucose, Bld 173 (*)    BUN 24 (*)    GFR calc non Af Amer 56 (*)    GFR calc Af Amer 65 (*)    All other components within normal limits  PROTIME-INR  SAMPLE TO BLOOD BANK   Imaging Review Ct Chest W Contrast  06/12/2013   CLINICAL DATA:  History of fall. Crepitus in the right posterior chest.  EXAM: CT CHEST WITH CONTRAST  TECHNIQUE: Multidetector CT imaging of the chest was performed during intravenous contrast administration.  CONTRAST:  75mL OMNIPAQUE IOHEXOL 300 MG/ML  SOLN  COMPARISON:  No priors.  FINDINGS: Mediastinum: Heart size is mildly enlarged. There is no significant pericardial fluid, thickening or pericardial calcification. There is atherosclerosis of the thoracic aorta, the great vessels of the mediastinum and the coronary arteries, including calcified atherosclerotic plaque in the left main, left anterior  descending, left circumflex and right coronary arteries. Calcifications of the aortic valve and mitral annulus. Aneurysmal dilatation the ascending thoracic aorta which measures up to 4.6 cm in diameter. No evidence of thoracic aortic dissection at this time. Massive hiatal hernia redemonstrated. The entire stomach appears intrathoracic at this time.  Lungs/Pleura: Trace amount of intermediate attenuation fluid in the posterior aspect of the right hemithorax, likely to represent proteinaceous or mildly hemorrhagic pleural effusion. There is also a small volume of pneumothorax. Additionally, there are multiple areas of subpleural high attenuation in the posterior aspect of the right hemithorax, compatible with subpleural hemorrhage related to multiple overlying rib fractures. Some mild passive atelectasis is noted in the right lower lobe. There is also some passive atelectasis in the left lower lobe adjacent to the large hiatal hernia. No consolidative airspace disease small amount of scarring in the inferior segment of the lingula.  Upper Abdomen: Status post cholecystectomy. Mild intra and extrahepatic biliary ductal dilatation, which appears to be chronic and is very similar to prior study 04/02/2012. 1.4 cm low-attenuation lesion in segment 2 the liver is similar to the prior study, most compatible with a small cyst.  Musculoskeletal: Acute fractures of the posterolateral aspects of the right 8th and 9th ribs, as well as the posterior aspect of the right 10th rib are noted. Subcutaneous emphysema in the right chest wall adjacent to these fractures. Old compression fractures at T7 (30%  loss of anterior vertebral body height), T11 (90% loss of central vertebral body height), L1 (80% loss of anterior vertebral body height), similar prior studies. There are no aggressive appearing lytic or blastic lesions noted in the visualized portions of the skeleton.  IMPRESSION: 1. Multiple displaced fractures of the right 8th,  9th and 10th ribs posterolaterally with small amount of subpleural hemorrhage and small right hemopneumothorax, as above. 2. Mild aneurysmal dilatation of the ascending thoracic aorta (4.6 cm in diameter). 3. Large hiatal hernia with completely intrathoracic stomach. 4. Atherosclerosis, including left main and 3 vessel coronary artery disease. 5. Additional incidental findings, as above, similar to prior studies. Critical Value/emergent results were called by telephone at the time of interpretation on 06/12/2013 at 2:24 AM to [facility] Dietrick Barris , who verbally acknowledged these results.   Electronically Signed   By: Trudie Reed M.D.   On: 06/12/2013 02:28   Dg Chest Port 1 View  06/12/2013   CLINICAL DATA:  History of fall complaining of chest pain. Crepitus.  EXAM: PORTABLE CHEST - 1 VIEW  COMPARISON:  Chest 02/15/2013.  FINDINGS: Massive hiatal hernia redemonstrated. This is associated with some passive atelectasis throughout the lower lobes the lungs bilaterally. No definite consolidative airspace disease. No pneumothorax. No evidence of pulmonary edema. Heart size appears mildly enlarged. Mediastinal contours are distorted by patient's rotation to the right. Atherosclerosis in the thoracic aorta visualized portions of the bony thorax appear grossly intact.  IMPRESSION: 1. No definite radiographic evidence of significant acute traumatic injury to the thorax. 2. Massive hiatal hernia redemonstrated with some associated bibasilar subsegmental atelectasis. 3. Atherosclerosis. 4. Mild cardiomegaly.   Electronically Signed   By: Trudie Reed M.D.   On: 06/12/2013 00:58     MDM   1. Multiple rib fractures, right, closed, initial encounter   2. Fall at home, initial encounter   3. Traumatic hemopneumothorax, initial encounter    79 year old male status post fall.  Suspect rib fractures with possible pneumothorax, given subcutaneous air.  We'll start with portable chest x-ray, will get labs.  We'll plan  for CT chest for further evaluation.    Olivia Mackie, MD 06/12/13 [phone]

## 2013-06-12 NOTE — ED Notes (Signed)
Slipped getting out of shower/tub.  Pain to right back area; ems reports crepetis noted with inspiration as well as pt report increase pain with inspiration.  Was given fentanyl en route to control pain.

## 2013-06-12 NOTE — ED Notes (Signed)
Portable CXR completed.  Lab in room for blood draw.

## 2013-06-12 NOTE — Progress Notes (Signed)
UR COMPLETED  

## 2013-06-12 NOTE — ED Notes (Signed)
Assisted Dr Norlene Campbell with removal of KED, soft towel cervical support.

## 2013-06-12 NOTE — ED Notes (Signed)
Portable pelvis xray completed.

## 2013-06-12 NOTE — H&P (Signed)
Christopher [name] is an 79 y.o. male.   Chief Complaint: s/p fall in shower on right side HPI: 62 yom consult from Dr Marisa Severin presents after a fall from in the shower.  He slipped getting out of shower and fell on his right side.  He remembers event.  Unable to stand and crawled to call 911.  No LOC.  He complains of right sided chest pain. History of stroke last year and is on plavix since then. Lives alone. Son is present in er.  Past Medical History  Diagnosis Date  . GERD (gastroesophageal reflux disease) 08/1996  . Hyperlipemia 08/1986  . Hypertension Pre 1996  . Osteoporosis 12/2004    dexa T -2.8 (05/2012), T11 compression fx  . Pneumonia 22 yoa  . History of Doppler echocardiogram 05/1995    MVP, MOD AI, MILD TR, MILD MR  . Elevated PSA     decided against further eval  . Diverticulosis   . Bilateral sensorineural hearing loss 02/2012    some conductive left side  . Compression fracture     multiple  . AV block, 2nd degree 04/2012    seen cards, monitor for now, pt does not desire monitor/pacer  . BCC (basal cell carcinoma of skin) 08/2012    s/p excision Terri Piedra)  . HOH (hard of hearing)   . Depression   . Chronic kidney disease     elevated PSA-no tx  . CVA (cerebral infarction) 04/20/2012    04/16/12 IMPRESSION:  1. 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 No mass effect or hemorrhage. Basilar artery perforator territory.  2. See MRA findings below.  3. Otherwise stable MRI appearance of the brain since 2011 including extensive perivascular spaces (etat crible appearance).      Past Surgical History  Procedure Laterality Date  . Cholecystectomy  05/1995  . Exercise treadmill  09/12/2006    NML  . Inguinal hernia repair  08/14/2010    Dr. Daphine Deutscher  . Esophagogastroduodenoscopy  08/18/2004    H. H. Dr. Russella Dar  . Colonoscopy  05/01/2007    divericulosis, int hemorrhoids - Dr. Russella Dar  . Dexa  05/2012    T score -2.8 hip, T11 compression fx  . Skin  full thickness graft Left 10/19/2012    Procedure: REPAIR OF LEFT EAR WITH TISSUE REARRANGEMENT SKIN GRAFT FULL THICKNESS;  Surgeon: Wayland Denis, DO;  Location: [facility];  Service: Plastics;  Laterality: Left;  . US echocardiography  05/2013    Mod LVH, EF 60-65%, indeterminate diastolic fxn, aortic sclerosis without stenosis, mildly dilated RV with nl sys fxn, mod dilated AA    Family History  Problem Relation Age of Onset  . Heart disease Mother     MI  . Hypertension Mother   . Stroke Mother     ?  . Stroke Father   . Diabetes Sister   . Hypertension Sister   . Hypertension Sister   . Hypertension Sister   . Stroke Sister     recurrent mini strokes  . Cancer Neg Hx    Social History:  reports that he has quit smoking. He has quit using smokeless tobacco. He reports that he does not drink alcohol or use illicit drugs.  Allergies: No Known Allergies Current medication list reviewed with patient   Results for orders placed during the hospital encounter of 06/12/13 (from the past 48 hour(s))  SAMPLE TO BLOOD BANK     Status: None   Collection  Time    06/12/13 12:45 AM      Result Value Range   Blood Bank Specimen SAMPLE AVAILABLE FOR TESTING     Sample Expiration 06/13/2013    CBC WITH DIFFERENTIAL     Status: Abnormal   Collection Time    06/12/13 12:46 AM      Result Value Range   WBC 11.8 (*) 4.0 - 10.5 K/uL   RBC 4.58  4.22 - 5.81 MIL/uL   Hemoglobin 15.4  13.0 - 17.0 g/dL   HCT 78.4  69.6 - 29.5 %   MCV 94.5  78.0 - 100.0 fL   MCH 33.6  26.0 - 34.0 pg   MCHC 35.6  30.0 - 36.0 g/dL   RDW 28.4  13.2 - 44.0 %   Platelets 190  150 - 400 K/uL   Neutrophils Relative % 73  43 - 77 %   Neutro Abs 8.6 (*) 1.7 - 7.7 K/uL   Lymphocytes Relative 17  12 - 46 %   Lymphs Abs 2.0  0.7 - 4.0 K/uL   Monocytes Relative 8  3 - 12 %   Monocytes Absolute 0.9  0.1 - 1.0 K/uL   Eosinophils Relative 2  0 - 5 %   Eosinophils Absolute 0.3  0.0 - 0.7 K/uL   Basophils  Relative 0  0 - 1 %   Basophils Absolute 0.0  0.0 - 0.1 K/uL  BASIC METABOLIC PANEL     Status: Abnormal   Collection Time    06/12/13 12:46 AM      Result Value Range   Sodium 136  135 - 145 mEq/L   Potassium 4.3  3.5 - 5.1 mEq/L   Chloride 101  96 - 112 mEq/L   CO2 25  19 - 32 mEq/L   Glucose, Bld 173 (*) 70 - 99 mg/dL   BUN 24 (*) 6 - 23 mg/dL   Creatinine, Ser 1.02  0.50 - 1.35 mg/dL   Calcium 9.5  8.4 - 72.5 mg/dL   GFR calc non Af Amer 56 (*) >90 mL/min   GFR calc Af Amer 65 (*) >90 mL/min   Comment: (NOTE)     The eGFR has been calculated using the CKD EPI equation.     This calculation has not been validated in all clinical situations.     eGFR's persistently <90 mL/min signify possible Chronic Kidney     Disease.  PROTIME-INR     Status: None   Collection Time    06/12/13 12:46 AM      Result Value Range   Prothrombin Time 13.1  11.6 - 15.2 seconds   INR 1.01  0.00 - 1.49   Ct Chest W Contrast  06/12/2013   CLINICAL DATA:  History of fall. Crepitus in the right posterior chest.  EXAM: CT CHEST WITH CONTRAST  TECHNIQUE: Multidetector CT imaging of the chest was performed during intravenous contrast administration.  CONTRAST:  75mL OMNIPAQUE IOHEXOL 300 MG/ML  SOLN  COMPARISON:  No priors.  FINDINGS: Mediastinum: Heart size is mildly enlarged. There is no significant pericardial fluid, thickening or pericardial calcification. There is atherosclerosis of the thoracic aorta, the great vessels of the mediastinum and the coronary arteries, including calcified atherosclerotic plaque in the left main, left anterior descending, left circumflex and right coronary arteries. Calcifications of the aortic valve and mitral annulus. Aneurysmal dilatation the ascending thoracic aorta which measures up to 4.6 cm in diameter. No evidence of thoracic aortic dissection at this time.  Massive hiatal hernia redemonstrated. The entire stomach appears intrathoracic at this time.  Lungs/Pleura: Trace  amount of intermediate attenuation fluid in the posterior aspect of the right hemithorax, likely to represent proteinaceous or mildly hemorrhagic pleural effusion. There is also a small volume of pneumothorax. Additionally, there are multiple areas of subpleural high attenuation in the posterior aspect of the right hemithorax, compatible with subpleural hemorrhage related to multiple overlying rib fractures. Some mild passive atelectasis is noted in the right lower lobe. There is also some passive atelectasis in the left lower lobe adjacent to the large hiatal hernia. No consolidative airspace disease small amount of scarring in the inferior segment of the lingula.  Upper Abdomen: Status post cholecystectomy. Mild intra and extrahepatic biliary ductal dilatation, which appears to be chronic and is very similar to prior study 04/02/2012. 1.4 cm low-attenuation lesion in segment 2 the liver is similar to the prior study, most compatible with a small cyst.  Musculoskeletal: Acute fractures of the posterolateral aspects of the right 8th and 9th ribs, as well as the posterior aspect of the right 10th rib are noted. Subcutaneous emphysema in the right chest wall adjacent to these fractures. Old compression fractures at T7 (30% loss of anterior vertebral body height), T11 (90% loss of central vertebral body height), L1 (80% loss of anterior vertebral body height), similar prior studies. There are no aggressive appearing lytic or blastic lesions noted in the visualized portions of the skeleton.  IMPRESSION: 1. Multiple displaced fractures of the right 8th, 9th and 10th ribs posterolaterally with small amount of subpleural hemorrhage and small right hemopneumothorax, as above. 2. Mild aneurysmal dilatation of the ascending thoracic aorta (4.6 cm in diameter). 3. Large hiatal hernia with completely intrathoracic stomach. 4. Atherosclerosis, including left main and 3 vessel coronary artery disease. 5. Additional incidental  findings, as above, similar to prior studies. Critical Value/emergent results were called by telephone at the time of interpretation on 06/12/2013 at 2:24 AM to [facility] OTTER , who verbally acknowledged these results.   Electronically Signed   By: Trudie Reed M.D.   On: 06/12/2013 02:28   Dg Chest Port 1 View  06/12/2013   CLINICAL DATA:  History of fall complaining of chest pain. Crepitus.  EXAM: PORTABLE CHEST - 1 VIEW  COMPARISON:  Chest 02/15/2013.  FINDINGS: Massive hiatal hernia redemonstrated. This is associated with some passive atelectasis throughout the lower lobes the lungs bilaterally. No definite consolidative airspace disease. No pneumothorax. No evidence of pulmonary edema. Heart size appears mildly enlarged. Mediastinal contours are distorted by patient's rotation to the right. Atherosclerosis in the thoracic aorta visualized portions of the bony thorax appear grossly intact.  IMPRESSION: 1. No definite radiographic evidence of significant acute traumatic injury to the thorax. 2. Massive hiatal hernia redemonstrated with some associated bibasilar subsegmental atelectasis. 3. Atherosclerosis. 4. Mild cardiomegaly.   Electronically Signed   By: Trudie Reed M.D.   On: 06/12/2013 00:58    Review of Systems  Constitutional: Negative for fever and chills.  Respiratory: Positive for shortness of breath.   Cardiovascular: Positive for chest pain (right sidemostly posterior).  Gastrointestinal: Negative for abdominal pain.    Blood pressure 119/78, pulse 95, temperature 98.5 F (36.9 C), temperature source Oral, resp. rate 14, SpO2 95.00%. Physical Exam  Vitals reviewed. Constitutional: He is oriented to person, place, and time. He appears well-developed and well-nourished.  HENT:  Head: Normocephalic and atraumatic.  Right Ear: External ear normal.  Left Ear: External ear normal.  Nose: Nose normal.  Mouth/Throat: Oropharynx is clear and moist.  Eyes: Conjunctivae and EOM are  normal. Pupils are equal, round, and reactive to light.  Neck: Normal range of motion. Neck supple.  Cardiovascular: Normal rate, regular rhythm, normal heart sounds and intact distal pulses.   Respiratory: Effort normal. Not tachypneic. No respiratory distress. He has decreased breath sounds in the right lower field. He exhibits tenderness (right posterior chest wall with ecchymosis and tenderness). He exhibits no laceration and no crepitus.  GI: Soft. Bowel sounds are normal. There is no tenderness.  Musculoskeletal: Normal range of motion. He exhibits no edema and no tenderness.  Lymphadenopathy:    He has no cervical adenopathy.  Neurological: He is alert and oriented to person, place, and time. He has normal strength. No sensory deficit.  Skin: Skin is warm and dry. He is not diaphoretic.     Assessment/Plan S/p fall  1. Pain control, aggressive pulm toilet.  Discussed risk of pna with patient and son. 2. Will repeat cxr in a few hours to make sure no increasing ptx but I think this likely can get managed conservatively 3. Will hold plavix for now 4. Start lovenox later today, protonix (large peh noted), scds  Julez Huseby 06/12/2013, 3:38 AM

## 2013-06-12 NOTE — Progress Notes (Signed)
Patient ID: Christopher [name], male   DOB: [birth year], 79 y.o.   MRN: [id number]    Subjective: R rib pain, C/O some wheezing but he says he has that at home  Objective: Vital signs in last 24 hours: Temp:  [97.9 F (36.6 C)-98.5 F (36.9 C)] 97.9 F (36.6 C) (11/04 0431) Pulse Rate:  [91-102] 102 (11/04 0431) Resp:  [12-19] 18 (11/04 0431) BP: (103-179)/(78-110) 179/100 mmHg (11/04 0431) SpO2:  [91 %-98 %] 98 % (11/04 0431) Last BM Date: 06/11/13  Intake/Output from previous day: 11/03 0701 - 11/04 0700 In: 120 [P.O.:120] Out: 200 [Urine:200] Intake/Output this shift:    General appearance: alert and cooperative Resp: some wheeze B, BS equal Chest wall: right sided chest wall tenderness Cardio: regular rate and rhythm GI: soft, NT Neurologic: Mental status: Alert, oriented, thought content appropriate  Lab Results: CBC   Recent Labs  06/12/13 0046 06/12/13 0547  WBC 11.8* 12.4*  HGB 15.4 14.3  HCT 43.3 41.0  PLT 190 166   BMET  Recent Labs  06/12/13 0046 06/12/13 0547  NA 136  --   K 4.3  --   CL 101  --   CO2 25  --   GLUCOSE 173*  --   BUN 24*  --   CREATININE 1.13 1.02  CALCIUM 9.5  --    PT/INR  Recent Labs  06/12/13 0046  LABPROT 13.1  INR 1.01   ABG No results found for this basename: PHART, PCO2, PO2, HCO3,  in the last 72 hours  Studies/Results: Ct Chest W Contrast  06/12/2013   CLINICAL DATA:  History of fall. Crepitus in the right posterior chest.  EXAM: CT CHEST WITH CONTRAST  TECHNIQUE: Multidetector CT imaging of the chest was performed during intravenous contrast administration.  CONTRAST:  75mL OMNIPAQUE IOHEXOL 300 MG/ML  SOLN  COMPARISON:  No priors.  FINDINGS: Mediastinum: Heart size is mildly enlarged. There is no significant pericardial fluid, thickening or pericardial calcification. There is atherosclerosis of the thoracic aorta, the great vessels of the mediastinum and the coronary arteries, including calcified atherosclerotic  plaque in the left main, left anterior descending, left circumflex and right coronary arteries. Calcifications of the aortic valve and mitral annulus. Aneurysmal dilatation the ascending thoracic aorta which measures up to 4.6 cm in diameter. No evidence of thoracic aortic dissection at this time. Massive hiatal hernia redemonstrated. The entire stomach appears intrathoracic at this time.  Lungs/Pleura: Trace amount of intermediate attenuation fluid in the posterior aspect of the right hemithorax, likely to represent proteinaceous or mildly hemorrhagic pleural effusion. There is also a small volume of pneumothorax. Additionally, there are multiple areas of subpleural high attenuation in the posterior aspect of the right hemithorax, compatible with subpleural hemorrhage related to multiple overlying rib fractures. Some mild passive atelectasis is noted in the right lower lobe. There is also some passive atelectasis in the left lower lobe adjacent to the large hiatal hernia. No consolidative airspace disease small amount of scarring in the inferior segment of the lingula.  Upper Abdomen: Status post cholecystectomy. Mild intra and extrahepatic biliary ductal dilatation, which appears to be chronic and is very similar to prior study 04/02/2012. 1.4 cm low-attenuation lesion in segment 2 the liver is similar to the prior study, most compatible with a small cyst.  Musculoskeletal: Acute fractures of the posterolateral aspects of the right 8th and 9th ribs, as well as the posterior aspect of the right 10th rib are noted. Subcutaneous emphysema in the  right chest wall adjacent to these fractures. Old compression fractures at T7 (30% loss of anterior vertebral body height), T11 (90% loss of central vertebral body height), L1 (80% loss of anterior vertebral body height), similar prior studies. There are no aggressive appearing lytic or blastic lesions noted in the visualized portions of the skeleton.  IMPRESSION: 1. Multiple  displaced fractures of the right 8th, 9th and 10th ribs posterolaterally with small amount of subpleural hemorrhage and small right hemopneumothorax, as above. 2. Mild aneurysmal dilatation of the ascending thoracic aorta (4.6 cm in diameter). 3. Large hiatal hernia with completely intrathoracic stomach. 4. Atherosclerosis, including left main and 3 vessel coronary artery disease. 5. Additional incidental findings, as above, similar to prior studies. Critical Value/emergent results were called by telephone at the time of interpretation on 06/12/2013 at 2:24 AM to [facility] OTTER , who verbally acknowledged these results.   Electronically Signed   By: Trudie Reed M.D.   On: 06/12/2013 02:28   Dg Pelvis Portable  06/12/2013   CLINICAL DATA:  History of fall. Evaluate for potential fracture.  EXAM: PORTABLE PELVIS 1-2 VIEWS  COMPARISON:  No priors.  FINDINGS: AP view of the pelvis demonstrates definite acute displaced fractures of the bony pelvic ring. Old healed right superior inferior pubic fractures. Bilateral proximal femurs as visualized intact, and the femoral heads appear to be located bilaterally on this single view examination. Urinary bladder is filled with contrast material, and there is irregularity of the inferior surface of the bladder, likely related to an enlarged prostate gland. Small bladder diverticulum incidentally noted on the left side.  IMPRESSION: 1. No evidence of significant acute traumatic injury to the bony pelvis 2. Small left-sided bladder diverticulum incidentally noted. 3. Old healed fractures of the right superior and inferior pubic rami. .   Electronically Signed   By: Trudie Reed M.D.   On: 06/12/2013 04:12   Dg Chest Port 1 View  06/12/2013   CLINICAL DATA:  Rule out pneumothorax.  Fall.  EXAM: PORTABLE CHEST - 1 VIEW  COMPARISON:  06/12/2013  FINDINGS: Right apical pneumothorax measuring approximately 15 mm. This is noted on the chest CT. No significant pleural  effusion. Right rib fractures noted on CT.  Cardiac enlargement without heart failure. Bibasilar atelectasis. Large hiatal hernia.  IMPRESSION: 15 mm right pneumothorax.   Electronically Signed   By: Marlan Palau M.D.   On: 06/12/2013 07:01   Dg Chest Port 1 View  06/12/2013   CLINICAL DATA:  History of fall complaining of chest pain. Crepitus.  EXAM: PORTABLE CHEST - 1 VIEW  COMPARISON:  Chest 02/15/2013.  FINDINGS: Massive hiatal hernia redemonstrated. This is associated with some passive atelectasis throughout the lower lobes the lungs bilaterally. No definite consolidative airspace disease. No pneumothorax. No evidence of pulmonary edema. Heart size appears mildly enlarged. Mediastinal contours are distorted by patient's rotation to the right. Atherosclerosis in the thoracic aorta visualized portions of the bony thorax appear grossly intact.  IMPRESSION: 1. No definite radiographic evidence of significant acute traumatic injury to the thorax. 2. Massive hiatal hernia redemonstrated with some associated bibasilar subsegmental atelectasis. 3. Atherosclerosis. 4. Mild cardiomegaly.   Electronically Signed   By: Trudie Reed M.D.   On: 06/12/2013 00:58    Anti-infectives: Anti-infectives   None      Assessment/Plan: Fall R post rib FX 8-10 with small HPTX - sats good, add bronchodilators, CXR in AM Large HH - this is chronic VTE - start Lovenox later today  Mobilize   LOS: 0 days    Violeta Gelinas, MD, MPH, FACS Pager: [phone]  06/12/2013

## 2013-06-13 ENCOUNTER — Observation Stay (HOSPITAL_COMMUNITY): Payer: Medicare Other

## 2013-06-13 MED ORDER — TRAMADOL HCL 50 MG PO TABS
100.0000 mg | ORAL_TABLET | Freq: Four times a day (QID) | ORAL | Status: DC
  Administered 2013-06-13 – 2013-06-17 (×14): 100 mg via ORAL
  Filled 2013-06-13 (×14): qty 2

## 2013-06-13 MED ORDER — FUROSEMIDE 20 MG PO TABS
20.0000 mg | ORAL_TABLET | Freq: Every day | ORAL | Status: DC
  Administered 2013-06-13 – 2013-06-18 (×6): 20 mg via ORAL
  Filled 2013-06-13 (×7): qty 1

## 2013-06-13 MED ORDER — HYDROCODONE-ACETAMINOPHEN 5-325 MG PO TABS
0.5000 | ORAL_TABLET | ORAL | Status: DC | PRN
  Administered 2013-06-13 – 2013-06-14 (×2): 1 via ORAL
  Filled 2013-06-13 (×2): qty 1

## 2013-06-13 MED ORDER — ENOXAPARIN SODIUM 40 MG/0.4ML ~~LOC~~ SOLN
40.0000 mg | SUBCUTANEOUS | Status: DC
  Administered 2013-06-14 – 2013-06-18 (×5): 40 mg via SUBCUTANEOUS
  Filled 2013-06-13 (×6): qty 0.4

## 2013-06-13 MED ORDER — PANTOPRAZOLE SODIUM 40 MG PO TBEC
40.0000 mg | DELAYED_RELEASE_TABLET | Freq: Two times a day (BID) | ORAL | Status: DC
  Administered 2013-06-13 – 2013-06-18 (×11): 40 mg via ORAL
  Filled 2013-06-13 (×11): qty 1

## 2013-06-13 MED ORDER — NAPROXEN 500 MG PO TABS
500.0000 mg | ORAL_TABLET | Freq: Two times a day (BID) | ORAL | Status: DC
  Administered 2013-06-13 – 2013-06-18 (×9): 500 mg via ORAL
  Filled 2013-06-13 (×12): qty 1

## 2013-06-13 MED ORDER — ATORVASTATIN CALCIUM 10 MG PO TABS
10.0000 mg | ORAL_TABLET | Freq: Every day | ORAL | Status: DC
  Administered 2013-06-13 – 2013-06-16 (×4): 10 mg via ORAL
  Filled 2013-06-13 (×6): qty 1

## 2013-06-13 MED ORDER — CLOPIDOGREL BISULFATE 75 MG PO TABS
75.0000 mg | ORAL_TABLET | Freq: Every day | ORAL | Status: DC
  Administered 2013-06-13 – 2013-06-18 (×6): 75 mg via ORAL
  Filled 2013-06-13 (×7): qty 1

## 2013-06-13 MED ORDER — POTASSIUM CHLORIDE CRYS ER 20 MEQ PO TBCR
20.0000 meq | EXTENDED_RELEASE_TABLET | Freq: Every day | ORAL | Status: DC
  Administered 2013-06-13 – 2013-06-18 (×6): 20 meq via ORAL
  Filled 2013-06-13 (×6): qty 1

## 2013-06-13 MED ORDER — MORPHINE SULFATE 2 MG/ML IJ SOLN: 2.0000 mg | INTRAMUSCULAR | Status: DC | PRN

## 2013-06-13 MED ORDER — IPRATROPIUM-ALBUTEROL 20-100 MCG/ACT IN AERS
1.0000 | INHALATION_SPRAY | Freq: Four times a day (QID) | RESPIRATORY_TRACT | Status: DC | PRN
  Filled 2013-06-13: qty 4

## 2013-06-13 NOTE — Progress Notes (Signed)
Patient ID: Christopher [name], male   DOB: 1924-06-03, 79 y.o.   MRN: [id number]   LOS: 1 day   Subjective: C/o more pain today but otherwise no change.   Objective: Vital signs in last 24 hours: Temp:  [98.2 F (36.8 C)-99 F (37.2 C)] 99 F (37.2 C) (11/05 0605) Pulse Rate:  [80-95] 80 (11/05 0605) Resp:  [18] 18 (11/05 0605) BP: (115-170)/(66-100) 120/66 mmHg (11/05 0605) SpO2:  [94 %-97 %] 96 % (11/05 0605) Last BM Date: 06/11/13   IS:   Radiology Results  PORTABLE CHEST - 1 VIEW  COMPARISON: June 12, 2013 chest radiograph and chest CT  FINDINGS:  The right apical pneumothorax is stable. No tension component.  There is new patchy infiltrate in the right mid lung. There is  atelectasis in the left base. The heart is upper normal in size with  normal pulmonary vascularity. There is a large paraesophageal type  hernia. There is atherosclerotic change in the aorta. There are rib  fractures on the right, better seen on chest CT.  IMPRESSION:  Stable small right apical pneumothorax. New patchy infiltrate right  mid lung. The atelectasis left lower lobe. Large paraesophageal  hernia. Rib fractures on the right, better seen on CT.  Electronically Signed  By: Bretta Bang M.D.  On: 06/13/2013 07:30   Physical Exam General appearance: alert and no distress Resp: clear to auscultation bilaterally Cardio: regular rate and rhythm GI: normal findings: bowel sounds normal and soft, non-tender   Assessment/Plan: Fall  R post rib FX 8-10 with small HPTX - Add scheduled tramadol and NSAID for increased pain control and decreased narcotic usage. No wheezing today, will change nebs to MDI prn. Multiple medical problems -- Home meds FEN -- Increase Protonix given home Nexium usage, SL IV VTE - SCD's, Lovenox (will decrease to 40mg  qd) Dispo -- SNF    Freeman Caldron, PA-C Pager: [phone] General Trauma PA Pager: [phone]   06/13/2013

## 2013-06-13 NOTE — Evaluation (Signed)
Occupational Therapy Evaluation Patient Details Name: Christopher [name] MRN: [id number] DOB: [birth year] Today's Date: 06/13/2013 Time: 1478-2956 OT Time Calculation (min): 41 min  OT Assessment / Plan / Recommendation History of present illness  Pt is an 79 year old male admitted after fall in shower at home resulting in small volume of pneumothorax and acute fractures of the posterolateral aspects of the right 8th and 9th ribs, as well as the posterior aspect of the right 10th rib   Clinical Impression   Pt is currently min to mod assist level for selfcare tasks and toileting.  Initially confused when therapist came into the room and thought he was at home instead of the hospital.  He did realize after stating this though that he was at the hospital and was able to state "[facility]".  Increased rib pain with all activities including bed mobility which required max assist this session.  Dyspnea 3/4 with simple transfers however sats remained at 93% or better on room air. Feel he will benefit from acute care OT to help increase functional performance of selfcare tasks however also feel he will need 24 hour supervision at discharge.  Since pt lives alone and doesn't have 24 hour supervision feel he will need short term SNF for rehab.    OT Assessment  Patient needs continued OT Services    Follow Up Recommendations  SNF;Supervision/Assistance - 24 hour    Barriers to Discharge Decreased caregiver support    Equipment Recommendations  None recommended by OT       Frequency  Min 2X/week    Precautions / Restrictions Precautions Precautions: Fall Precaution Comments: monitor sats Restrictions Weight Bearing Restrictions: No   Pertinent Vitals/Pain Pain 6/10 on the faces scale with pain reported in his right back/rib area,  o2 sats 95% on 4Ls, pt with severe dyspnea 3/4 with minimal activity    ADL  Eating/Feeding: Simulated;Independent Where Assessed - Eating/Feeding: Chair Grooming:  Performed;Minimal assistance Where Assessed - Grooming: Supported standing Upper Body Bathing: Performed;Set up Where Assessed - Upper Body Bathing: Unsupported sitting Lower Body Bathing: Simulated;Moderate assistance Where Assessed - Lower Body Bathing: Supported sit to stand Upper Body Dressing: Performed;Minimal assistance Where Assessed - Upper Body Dressing: Unsupported sitting Lower Body Dressing: Performed;Moderate assistance Where Assessed - Lower Body Dressing: Supported sit to Pharmacist, hospital: Performed;Minimal assistance Toilet Transfer Method: Other (comment) (ambulate to the bathroom with hand held assist.) Toilet Transfer Equipment: Other (comment) (pt stood to urinate) Where Assessed - Toileting Clothing Manipulation and Hygiene: Sit to stand from 3-in-1 or toilet Transfers/Ambulation Related to ADLs: Pt ambulates with consistent min assist to the bathroom with hand held assist.  Slow movements initially secondary to increased pain.   ADL Comments: Pt found with soaked gown and bed, unaware that he had had an accident.  Ambulated to the toilet with min assist and returned to the EOB to perform simple washup and change his gowns.  Pt unable to donn his sock over the right foot secondary to increased pain.  Represents a high fall risk at this time and since he has no consistent 24 hour supervision feel he will need short term SNF.    OT Diagnosis: Generalized weakness;Acute pain;Altered mental status  OT Problem List: Decreased strength;Decreased activity tolerance;Impaired balance (sitting and/or standing);Pain;Decreased knowledge of use of DME or AE;Decreased cognition OT Treatment Interventions: Self-care/ADL training;Patient/family education;Balance training;Therapeutic activities;DME and/or AE instruction   OT Goals(Current goals can be found in the care plan section) Acute Rehab OT  Goals Patient Stated Goal: Pt wants to go to rehab and then home. Time For Goal  Achievement: 06/20/13 Potential to Achieve Goals: Good  Visit Information  Last OT Received On: 06/13/13 Assistance Needed: +1 History of Present Illness:  Pt is an 79 year old male admitted after fall in shower at home resulting in small volume of pneumothorax and acute fractures of the posterolateral aspects of the right 8th and 9th ribs, as well as the posterior aspect of the right 10th rib       Prior Functioning     Home Living Family/patient expects to be discharged to:: Private residence Living Arrangements: Alone Available Help at Discharge: Skilled Nursing Facility Type of Home: House Home Access:  (threshold) Home Layout: One level Home Equipment: Environmental consultant - 2 wheels;Cane - single point;Shower seat Prior Function Level of Independence: Independent with assistive device(s) Comments: pt states he mostly uses SPC however also states using RW on "bad days" Communication Communication: HOH Dominant Hand: Right         Vision/Perception Vision - History Baseline Vision: Wears glasses all the time (does not have them with him at this time.) Patient Visual Report: No change from baseline Vision - Assessment Vision Assessment: Vision not tested Perception Perception: Within Functional Limits Praxis Praxis: Intact   Cognition  Cognition Arousal/Alertness: Awake/alert Behavior During Therapy: WFL for tasks assessed/performed Overall Cognitive Status: Impaired/Different from baseline Area of Impairment: Awareness Memory: Decreased short-term memory General Comments: Pt initially not remembering that he was in the hospital and not at home.  He also reported earlier that he got up to use the bathroom without assistance because he forgot that he was in the hospital.      Extremity/Trunk Assessment Upper Extremity Assessment Upper Extremity Assessment: Generalized weakness Lower Extremity Assessment Lower Extremity Assessment: Defer to PT evaluation Cervical / Trunk  Assessment Cervical / Trunk Assessment: Kyphotic     Mobility Bed Mobility Supine to Sit: 2: Max assist;HOB elevated Details for Bed Mobility Assistance: Pt needed therapist assistance to transition from supine to sit EOB.   Transfers Transfers: Sit to Stand Sit to Stand: 3: Mod assist;With upper extremity assist;From bed Stand to Sit: 4: Min assist;With upper extremity assist;To bed Details for Transfer Assistance: Pt needed therapist's assistance for sit to stand secondary to increased pain.       Exercise     Balance Balance Balance Assessed: Yes Static Standing Balance Static Standing - Balance Support: Left upper extremity supported Static Standing - Level of Assistance: 4: Min assist Dynamic Standing Balance Dynamic Standing - Balance Support: Left upper extremity supported Dynamic Standing - Level of Assistance: 4: Min assist   End of Session OT - End of Session Activity Tolerance: Patient limited by fatigue;Patient limited by pain Patient left: in chair;with call bell/phone within reach Nurse Communication: Mobility status  GO Functional Assessment Tool Used: clinical judgement Functional Limitation: Self care Self Care Current Status (Z6109): At least 40 percent but less than 60 percent impaired, limited or restricted Self Care Goal Status (U0454): At least 1 percent but less than 20 percent impaired, limited or restricted   Chava Dulac OTR/L 06/13/2013, 2:39 PM

## 2013-06-14 ENCOUNTER — Encounter (HOSPITAL_COMMUNITY): Payer: Self-pay | Admitting: General Practice

## 2013-06-14 NOTE — Progress Notes (Signed)
Patient does state that he is doing better.  This patient has been seen and I agree with the findings and treatment plan.  Marta Lamas. Gae Bon, MD, FACS [phone] (pager) [phone] (direct pager) Trauma Surgeon

## 2013-06-14 NOTE — Clinical Social Work Placement (Addendum)
Clinical Social Work Department CLINICAL SOCIAL WORK PLACEMENT NOTE 06/14/2013  Patient:  Christopher [name], Christopher [name]  Account Number:  [id number] Admit date:  06/12/2013  Clinical Social Worker:  Macario Golds, LCSW  Date/time:  06/14/2013 10:30 AM  Clinical Social Work is seeking post-discharge placement for this patient at the following level of care:   SKILLED NURSING   (*CSW will update this form in Epic as items are completed)   06/14/2013  Patient/family provided with [facility] Department of Clinical Social Work's list of facilities offering this level of care within the geographic area requested by the patient (or if unable, by the patient's family).  06/14/2013  Patient/family informed of their freedom to choose among providers that offer the needed level of care, that participate in Medicare, Medicaid or managed care program needed by the patient, have an available bed and are willing to accept the patient.  06/14/2013  Patient/family informed of [facility]' ownership interest in [facility], as well as of the fact that they are under no obligation to receive care at this facility.  PASARR submitted to EDS on 06/14/2013 PASARR number received from EDS on 06/14/2013  FL2 transmitted to all facilities in geographic area requested by pt/family on  06/14/2013 FL2 transmitted to all facilities within larger geographic area on   Patient informed that his/her managed care company has contracts with or will negotiate with  certain facilities, including the following:     Patient/family informed of bed offers received:  06/15/2013 Patient chooses bed at   Physician recommends and patient chooses bed at    Patient to be transferred to  on   Patient to be transferred to facility by   The following physician request were entered in Epic:   Additional Comments:

## 2013-06-14 NOTE — Progress Notes (Signed)
Christopher Gaertner O. Christopher [name], III, MD, FACS [phone] Trauma Surgeon  

## 2013-06-14 NOTE — Clinical Social Work Note (Signed)
Clinical Social Work Department BRIEF PSYCHOSOCIAL ASSESSMENT 06/13/2013  Patient:  Christopher [name], Christopher [name]     Account Number:  [id number]     Admit date:  06/12/2013  Clinical Social Worker:  Verl Blalock  Date/Time:  06/13/2013 04:00 PM  Referred by:  Physician  Date Referred:  06/13/2013 Referred for  SNF Placement   Other Referral:   Interview type:  Patient Other interview type:   No family currently at bedside    PSYCHOSOCIAL DATA Living Status:  ALONE Admitted from facility:   Level of care:   Primary support name:  Jalil, [place]  [phone] Primary support relationship to patient:  CHILD, ADULT Degree of support available:   Strong    CURRENT CONCERNS Current Concerns  Post-Acute Placement   Other Concerns:    SOCIAL WORK ASSESSMENT / PLAN Clinical Social Worker met with patient at bedside to offer support and discuss patient plans at discharge.  Patient states that he lives at home alone and just lost his balance at the end of his shower and fell.  Patient understands that going back home alone at this time would be unsafe and is agreeable with ST-SNF placement.  CSW to initiate search in [facility] and follow up with patient regarding possible bed offers.  Patient plans to update his family on his plans at discharge.    Clinical Social Worker inquired about current substance use.  Patient states that he does not drink any alcohol or use any drugs at this time.  SBIRT complete.  No further resources needed at this time.  CSW remains available for support and to facilitate patient discharge needs once medically stable.   Assessment/plan status:  Psychosocial Support/Ongoing Assessment of Needs Other assessment/ plan:   Information/referral to community resources:   Visual merchandiser offered patient facility list, however patient prefers to see the list with bed availability already documented.  CSW to provide to patient once bed offers received.  CSW remains  available for additional resources as needed.    PATIENT'S/FAMILY'S RESPONSE TO PLAN OF CARE: Patient alert and oriented x3 laying in the bed.  No family currently at bedside, however have been in and out all day and brought patient food in that he enjoys to have. Patient feels that his family will be a good support system for him and encourage him to get back home.  Patient very willing and agreeable to engage in conversation and pursue SNF options.  Patient understanding of social work role and appreciative of CSW support and concern.

## 2013-06-14 NOTE — Progress Notes (Signed)
Physical Therapy Treatment Patient Details Name: Christopher [name] MRN: [id number] DOB: [birth year] Today's Date: 06/14/2013 Time: 4010-2725 PT Time Calculation (min): 18 min  PT Assessment / Plan / Recommendation  History of Present Illness  Pt is an 79 year old male admitted after fall in shower at home resulting in small volume of pneumothorax and acute fractures of the posterolateral aspects of the right 8th and 9th ribs, as well as the posterior aspect of the right 10th rib   PT Comments   Pt on 3L/min supplemental O2 throughout session, at 93% prior to ambulation and 92% after ambulation. Pt cued for pursed-lip breathing throughout gait training. Pain and fatigue limiting pt mostly, with reports of pain in back, eased by pillow in chair.   Follow Up Recommendations  Supervision for mobility/OOB;SNF     Does the patient have the potential to tolerate intense rehabilitation     Barriers to Discharge        Equipment Recommendations  None recommended by PT    Recommendations for Other Services    Frequency Min 3X/week   Progress towards PT Goals Progress towards PT goals: Progressing toward goals  Plan Current plan remains appropriate    Precautions / Restrictions Precautions Precautions: Fall Precaution Comments: monitor sats Restrictions Weight Bearing Restrictions: No   Pertinent Vitals/Pain See above.    Mobility  Bed Mobility Bed Mobility: Not assessed Details for Bed Mobility Assistance: Pt received sitting up in recliner Transfers Transfers: Sit to Stand;Stand to Sit Sit to Stand: 3: Mod assist;From chair/3-in-1;With upper extremity assist Stand to Sit: 4: Min guard;To chair/3-in-1;With upper extremity assist Details for Transfer Assistance: VC's for hand placement on seated surface. Pt attempted sit>stand but could not complete transfer. With mod assist on second try pt was able to come to full stand.  Ambulation/Gait Ambulation/Gait Assistance: 4: Min  assist Ambulation Distance (Feet): 75 Feet Assistive device: Rolling walker Ambulation/Gait Assistance Details: Occasional min assist for lateral balance disturbances. Pt on 3L/min supplemental O2 throughout gait training. VC's for pursed-lip breathing and improved posture. Gait Pattern: Step-through pattern;Decreased stride length Gait velocity: decreased    Exercises     PT Diagnosis:    PT Problem List:   PT Treatment Interventions:     PT Goals (current goals can now be found in the care plan section) Acute Rehab PT Goals Patient Stated Goal: Pt wants to go to rehab and then home. PT Goal Formulation: With patient Time For Goal Achievement: 06/26/13 Potential to Achieve Goals: Good  Visit Information  Last PT Received On: 06/14/13 Assistance Needed: +1 History of Present Illness:  Pt is an 79 year old male admitted after fall in shower at home resulting in small volume of pneumothorax and acute fractures of the posterolateral aspects of the right 8th and 9th ribs, as well as the posterior aspect of the right 10th rib    Subjective Data  Subjective: "My back is hurting for some reason." Patient Stated Goal: Pt wants to go to rehab and then home.   Cognition  Cognition Arousal/Alertness: Awake/alert Behavior During Therapy: WFL for tasks assessed/performed Overall Cognitive Status: Impaired/Different from baseline Area of Impairment: Awareness Memory: Decreased short-term memory    Balance  Balance Balance Assessed: Yes Static Standing Balance Static Standing - Balance Support: Bilateral upper extremity supported Static Standing - Level of Assistance: 5: Stand by assistance Dynamic Standing Balance Dynamic Standing - Balance Support: Bilateral upper extremity supported Dynamic Standing - Level of Assistance: 4: Min assist  End of Session PT - End of Session Equipment Utilized During Treatment: Gait belt;Oxygen Activity Tolerance: Patient limited by fatigue Patient  left: in chair;with call bell/phone within reach Nurse Communication: Mobility status   GP     Ruthann Cancer 06/14/2013, 1:24 PM  Ruthann Cancer, PT, DPT [phone]

## 2013-06-14 NOTE — Progress Notes (Signed)
Patient ID: Christopher [name], male   DOB: [birth year], 79 y.o.   MRN: [id number]   LOS: 2 days   Subjective: Feeling a bit better today, pain not as bad.   Objective: Vital signs in last 24 hours: Temp:  [98 F (36.7 C)-98.3 F (36.8 C)] 98.3 F (36.8 C) (11/06 0454) Pulse Rate:  [76-112] 76 (11/06 0454) Resp:  [18] 18 (11/06 0454) BP: (106-157)/(62-106) 106/79 mmHg (11/06 0454) SpO2:  [91 %-96 %] 91 % (11/06 0454) Last BM Date: 06/11/13   IS: (- )   Physical Exam General appearance: alert and no distress Resp: clear to auscultation bilaterally Cardio: regular rate and rhythm GI: normal findings: bowel sounds normal and soft, non-tender   Assessment/Plan: Fall  R post rib FX 8-10 with small HPTX - Feels better though IS a bit worse. Continue to work on pulmonary toilet. Multiple medical problems -- Home meds  FEN -- No issues VTE - SCD's, Lovenox  Dispo -- SNF    Freeman Caldron, PA-C Pager: [phone] General Trauma PA Pager: [phone]   06/14/2013

## 2013-06-15 NOTE — Progress Notes (Signed)
Patient looks good.  Awaiting decision on SNF placements  This patient has been seen and I agree with the findings and treatment plan.  Marta Lamas. Gae Bon, MD, FACS [phone] (pager) [phone] (direct pager) Trauma Surgeon.

## 2013-06-15 NOTE — Progress Notes (Signed)
Patient ID: Christopher [name], male   DOB: 1924/06/08, 79 y.o.   MRN: [id number]   LOS: 3 days   Subjective: No new c/o.   Objective: Vital signs in last 24 hours: Temp:  [97.6 F (36.4 C)-97.8 F (36.6 C)] 97.8 F (36.6 C) (11/07 0541) Pulse Rate:  [77-89] 77 (11/07 0541) Resp:  [18] 18 (11/07 0541) BP: (90-98)/(44-78) 94/78 mmHg (11/07 0541) SpO2:  [91 %-96 %] 92 % (11/07 0541) Weight:  [165 lb (74.844 kg)] 165 lb (74.844 kg) (11/06 1100) Last BM Date: 06/11/13   IS: (+272ml)   Physical Exam General appearance: alert and no distress Resp: clear to auscultation bilaterally Cardio: regular rate and rhythm GI: normal findings: bowel sounds normal and soft, non-tender   Assessment/Plan: Fall  R post rib FX 8-10 with small HPTX - Feels better though IS a bit worse. Continue to work on pulmonary toilet.  Multiple medical problems -- Home meds  FEN -- No issues  VTE - SCD's, Lovenox  Dispo -- SNF    Freeman Caldron, PA-C Pager: [phone] General Trauma PA Pager: [phone]   06/15/2013

## 2013-06-15 NOTE — Clinical Social Work Note (Signed)
Clinical Social Worker continuing to follow for support and discharge planning needs.  CSW spoke with patient, patient son, and patient daughter at bedside to provide available bed offers.  Patient family has chosen, and in this order, [facility], [facility], [facility], and [facility].  CSW has contacted [facility] who states that patient meets medical criteria and it will depend on bed availability on Monday.  CSW to contact facilities in order on Monday to determine patient facility at discharge.  CSW to update patient family regarding patient discharge plans.  CSW remains available for support and to facilitate patient discharge needs once medically ready.  Macario Golds, [place] [phone]

## 2013-06-15 NOTE — Progress Notes (Signed)
Physical Therapy Treatment Patient Details Name: Christopher [name] MRN: [id number] DOB: [birth year] Today's Date: 06/15/2013 Time: 6578-4696 PT Time Calculation (min): 25 min  PT Assessment / Plan / Recommendation  History of Present Illness  Pt is an 79 year old male admitted after fall in shower at home resulting in small volume of pneumothorax and acute fractures of the posterolateral aspects of the right 8th and 9th ribs, as well as the posterior aspect of the right 10th rib   PT Comments   Pt is making progress towards functional mobility goals. Pt still requires cueing for pursed-lip breathing especially during gait training, and today pt ambulated on 3L/min supplemental O2. One minor LOB laterally with RW, requiring min assist to recover. Pt is improving with ability to perform sit<>stand transfers with decreased assistance to come to full stand.   Follow Up Recommendations  Supervision for mobility/OOB;SNF     Does the patient have the potential to tolerate intense rehabilitation     Barriers to Discharge        Equipment Recommendations  None recommended by PT    Recommendations for Other Services    Frequency Min 3X/week   Progress towards PT Goals Progress towards PT goals: Progressing toward goals  Plan Current plan remains appropriate    Precautions / Restrictions Precautions Precautions: Fall Precaution Comments: monitor sats Restrictions Weight Bearing Restrictions: No   Pertinent Vitals/Pain Oxygen saturation on 2.5L/min supplemental O2: at rest - 94%, after ambulation 93%; Pt ambulated on 3L/min supplemental O2 with portable tank.    Mobility  Bed Mobility Bed Mobility: Not assessed Details for Bed Mobility Assistance: Pt received sitting up in recliner Transfers Transfers: Sit to Stand;Stand to Sit Sit to Stand: 4: Min assist;From chair/3-in-1;With upper extremity assist Stand to Sit: 4: Min guard;To chair/3-in-1;With upper extremity assist Details for  Transfer Assistance: Pt demonstrated proper hand placement on seated surface but required VC's to improve posture, place hands on walker and complete transfer to full standing. Ambulation/Gait Ambulation/Gait Assistance: 4: Min assist Ambulation Distance (Feet): 75 Feet Assistive device: Rolling walker Ambulation/Gait Assistance Details: One minor LOB laterally in which pt required min assist to recover.  Gait Pattern: Step-through pattern;Decreased stride length Gait velocity: decreased    Exercises     PT Diagnosis:    PT Problem List:   PT Treatment Interventions:     PT Goals (current goals can now be found in the care plan section) Acute Rehab PT Goals Patient Stated Goal: Pt wants to go to rehab and then home. PT Goal Formulation: With patient Time For Goal Achievement: 06/26/13 Potential to Achieve Goals: Good  Visit Information  Last PT Received On: 06/15/13 Assistance Needed: +1 History of Present Illness:  Pt is an 79 year old male admitted after fall in shower at home resulting in small volume of pneumothorax and acute fractures of the posterolateral aspects of the right 8th and 9th ribs, as well as the posterior aspect of the right 10th rib    Subjective Data  Subjective: "I really think I am getting better." Patient Stated Goal: Pt wants to go to rehab and then home.   Cognition  Cognition Arousal/Alertness: Awake/alert Behavior During Therapy: WFL for tasks assessed/performed Overall Cognitive Status: Impaired/Different from baseline Area of Impairment: Awareness Memory: Decreased short-term memory    Balance  Balance Balance Assessed: Yes Static Standing Balance Static Standing - Balance Support: Bilateral upper extremity supported Static Standing - Level of Assistance: 5: Stand by assistance Dynamic Standing Balance  Dynamic Standing - Balance Support: Bilateral upper extremity supported Dynamic Standing - Level of Assistance: 4: Min assist  End of  Session PT - End of Session Equipment Utilized During Treatment: Gait belt;Oxygen Activity Tolerance: Patient limited by fatigue Patient left: in chair;with call bell/phone within reach Nurse Communication: Mobility status   GP     Ruthann Cancer 06/15/2013, 4:34 PM  Ruthann Cancer, PT, DPT [phone]

## 2013-06-16 ENCOUNTER — Inpatient Hospital Stay (HOSPITAL_COMMUNITY): Payer: Medicare Other

## 2013-06-16 NOTE — Progress Notes (Signed)
Called to patients room by RN due to oxygen desaturation via continuous pulse oximetry.  Patient was on 5lpm nasal canula saturating 86% and RR 18with clear breath sounds apical with diminished bases.  HR was 84 with no cyanosis present.  The patient was in no respiratory distress when asked.  Due to low 02 saturation i placed the patient on a 50% venti mask and his 02 saturation is now 92% with HR of 74 and RR of 16.  RN has been notified.

## 2013-06-16 NOTE — Progress Notes (Addendum)
Patient having intermittent confusion. Patient can answer all questions that is asked of him. Then patient will ask a question that is out of character for him. For example, "he asked if he was going back to his room". Redirected patient. Patient also had a scant amount of blood coming from the penis. Patient's scrotum looks bruised. When asked patient if he had any bleeding from penis before and he said no. Will continue to monitor.

## 2013-06-16 NOTE — Progress Notes (Signed)
Patient ID: AVETT REINECK, male   DOB: 14-Dec-1923, 79 y.o.   MRN: [id number]   LOS: 4 days   Subjective: Had some hypoxia this morning, minimally symptomatic.   Objective: Vital signs in last 24 hours: Temp:  [98.1 F (36.7 C)-98.8 F (37.1 C)] 98.6 F (37 C) (11/08 0557) Pulse Rate:  [73-85] 85 (11/08 0557) Resp:  [18] 18 (11/08 0557) BP: (93-99)/(55-66) 99/60 mmHg (11/08 0557) SpO2:  [94 %-97 %] 97 % (11/08 0557) Last BM Date: 04/15/16   IS: (=)   Physical Exam General appearance: alert and no distress Resp: clear to auscultation bilaterally Cardio: regular rate and rhythm GI: normal findings: bowel sounds normal and soft, non-tender   Assessment/Plan: Fall  R post rib FX 8-10 with small HPTX - Check CXR Multiple medical problems -- Home meds  FEN -- No issues  VTE - SCD's, Lovenox  Dispo -- SNF    Freeman Caldron, PA-C Pager: [phone] General Trauma PA Pager: [phone]   06/16/2013    Pt seen and examined.  He looks and feels fine.  Breathing well.  Pain controlled.  Xray pending.

## 2013-06-17 DIAGNOSIS — R197 Diarrhea, unspecified: Secondary | ICD-10-CM

## 2013-06-17 DIAGNOSIS — F29 Unspecified psychosis not due to a substance or known physiological condition: Secondary | ICD-10-CM

## 2013-06-17 LAB — CBC
HCT: 34.8 % — ABNORMAL LOW (ref 39.0–52.0)
Hemoglobin: 12 g/dL — ABNORMAL LOW (ref 13.0–17.0)
MCH: 33.3 pg (ref 26.0–34.0)
MCV: 96.7 fL (ref 78.0–100.0)
Platelets: 188 10*3/uL (ref 150–400)
RBC: 3.6 MIL/uL — ABNORMAL LOW (ref 4.22–5.81)
RDW: 13.6 % (ref 11.5–15.5)
WBC: 6.6 10*3/uL (ref 4.0–10.5)

## 2013-06-17 LAB — URINALYSIS, ROUTINE W REFLEX MICROSCOPIC
Glucose, UA: NEGATIVE mg/dL
Hgb urine dipstick: NEGATIVE
Ketones, ur: NEGATIVE mg/dL
Leukocytes, UA: NEGATIVE
Protein, ur: NEGATIVE mg/dL
Specific Gravity, Urine: 1.02 (ref 1.005–1.030)
Urobilinogen, UA: 1 mg/dL (ref 0.0–1.0)
pH: 5 (ref 5.0–8.0)

## 2013-06-17 LAB — BASIC METABOLIC PANEL
BUN: 64 mg/dL — ABNORMAL HIGH (ref 6–23)
CO2: 26 mEq/L (ref 19–32)
Chloride: 104 mEq/L (ref 96–112)
Creatinine, Ser: 1.56 mg/dL — ABNORMAL HIGH (ref 0.50–1.35)
GFR calc Af Amer: 44 mL/min — ABNORMAL LOW (ref >90)
Glucose, Bld: 114 mg/dL — ABNORMAL HIGH (ref 70–99)
Potassium: 3.6 mEq/L (ref 3.5–5.1)

## 2013-06-17 LAB — CLOSTRIDIUM DIFFICILE BY PCR: Toxigenic C. Difficile by PCR: NEGATIVE

## 2013-06-17 MED ORDER — SODIUM CHLORIDE 0.9 % IV BOLUS (SEPSIS)
500.0000 mL | Freq: Once | INTRAVENOUS | Status: AC
  Administered 2013-06-17: 500 mL via INTRAVENOUS

## 2013-06-17 NOTE — Progress Notes (Signed)
I have seen and examined the pt and agree with PA-Jeffery's progress note. Check stool for Cdiff for copious diarrhea

## 2013-06-17 NOTE — Progress Notes (Signed)
Patient ID: Christopher [name], male   DOB: 1924-03-16, 79 y.o.   MRN: [id number]   LOS: 5 days   Subjective: No c/o. Noted RN notes from last night.   Objective: Vital signs in last 24 hours: Temp:  [98.3 F (36.8 C)-98.8 F (37.1 C)] 98.3 F (36.8 C) (11/09 0556) Pulse Rate:  [79-89] 79 (11/09 0556) Resp:  [20] 20 (11/09 0556) BP: (118-128)/(82-90) 118/82 mmHg (11/09 0556) SpO2:  [95 %-100 %] 95 % (11/09 0556) Last BM Date: 06/16/13   IS: (=)   Physical Exam General appearance: alert, no distress and Ox3 Resp: clear to auscultation bilaterally Cardio: regular rate and rhythm GI: normal findings: bowel sounds normal and soft, non-tender   Assessment/Plan: Fall  R post rib FX 8-10 with small HPTX - Continue to work on pulmonary toilet.  Multiple medical problems -- Home meds  FEN -- Check CBC, BMET, and stool for C diff VTE - SCD's, Lovenox  Dispo -- SNF    Freeman Caldron, PA-C Pager: [phone] General Trauma PA Pager: [phone]   06/17/2013

## 2013-06-17 NOTE — Progress Notes (Addendum)
Patient has had intermittent confusion throughout the day. Patient's pulse ox was going off when I found him on the floor. Asked patient what he was trying to do. He said "I was trying to go to the bathroom". Patient did not hit his head. Patient stated that "he knew what he was doing and grabbed the bed". Patient landed on his buttocks. Patient did not lose consciousness or hit his head. Patient denies any injuries, chest pain, nausea or vomiting. Although patient was short of breath. Patient did not have his oxygen on when he was found on the floor. Placed patient back on oxygen once he was put back in bed. Patient's vitals were taken, MD notified and family member notifed. Patient can answer questions correctly. He was confused after the fall and asked why he was on the floor. Patient also tried to get out of the bed after the fall because "he said that he needed to go the kitchen". Will continue to monitor patient and his mental status.  Concerned with patient's confusion status. Mention to trauma PA and asked if the patient could have hot his head when he fell at home in the shower. PA said it was possible but with his intermittent confusion, does not see the need to do a CT scan.

## 2013-06-18 ENCOUNTER — Telehealth: Payer: Self-pay | Admitting: Family Medicine

## 2013-06-18 ENCOUNTER — Inpatient Hospital Stay (HOSPITAL_COMMUNITY): Payer: Medicare Other

## 2013-06-18 LAB — BASIC METABOLIC PANEL
BUN: 48 mg/dL — ABNORMAL HIGH (ref 6–23)
CO2: 27 mEq/L (ref 19–32)
Calcium: 8.8 mg/dL (ref 8.4–10.5)
Chloride: 105 mEq/L (ref 96–112)
GFR calc Af Amer: 69 mL/min — ABNORMAL LOW (ref >90)
GFR calc non Af Amer: 60 mL/min — ABNORMAL LOW (ref >90)
Glucose, Bld: 96 mg/dL (ref 70–99)
Potassium: 3.5 mEq/L (ref 3.5–5.1)
Sodium: 143 mEq/L (ref 135–145)

## 2013-06-18 LAB — CBC WITH DIFFERENTIAL/PLATELET
Basophils Absolute: 0 10*3/uL (ref 0.0–0.1)
Basophils Relative: 1 % (ref 0–1)
Eosinophils Relative: 5 % (ref 0–5)
HCT: 34.4 % — ABNORMAL LOW (ref 39.0–52.0)
Hemoglobin: 11.9 g/dL — ABNORMAL LOW (ref 13.0–17.0)
Lymphocytes Relative: 20 % (ref 12–46)
MCH: 33.1 pg (ref 26.0–34.0)
MCHC: 34.6 g/dL (ref 30.0–36.0)
Neutro Abs: 3.9 10*3/uL (ref 1.7–7.7)
Neutrophils Relative %: 59 % (ref 43–77)
RBC: 3.59 MIL/uL — ABNORMAL LOW (ref 4.22–5.81)

## 2013-06-18 MED ORDER — SACCHAROMYCES BOULARDII 250 MG PO CAPS: 250.0000 mg | ORAL_CAPSULE | Freq: Two times a day (BID) | ORAL | Status: DC

## 2013-06-18 MED ORDER — NAPROXEN 500 MG PO TABS: 500.0000 mg | ORAL_TABLET | Freq: Two times a day (BID) | ORAL | Status: DC

## 2013-06-18 MED ORDER — SACCHAROMYCES BOULARDII 250 MG PO CAPS
250.0000 mg | ORAL_CAPSULE | Freq: Two times a day (BID) | ORAL | Status: DC
  Administered 2013-06-18: 250 mg via ORAL
  Filled 2013-06-18 (×2): qty 1

## 2013-06-18 MED ORDER — ACETAMINOPHEN 325 MG PO TABS: 650.0000 mg | ORAL_TABLET | ORAL | Status: DC | PRN

## 2013-06-18 NOTE — Progress Notes (Signed)
UR completed.  Burnice Oestreicher, RN BSN M[facility] CCM Trauma/Neuro ICU Case Manager [phone]  

## 2013-06-18 NOTE — Progress Notes (Signed)
Pt to be d/c today to [facility].   Pt and family agreeable. Confirmed plans with facility.  Plan transfer via EMS.   Leron Croak, LCSWA [facility] Emergency Dept.  [phone]

## 2013-06-18 NOTE — Progress Notes (Signed)
Patient ID: Christopher [name], male   DOB: Sep 17, 1923, 79 y.o.   MRN: [id number]   LOS: 6 days   Subjective: Having worse pain today but otherwise ok. Spoke with him and dtr about confusion. Still having large, somewhat loose BM's but frequency is less.   Objective: Vital signs in last 24 hours: Temp:  [97.9 F (36.6 C)-98.2 F (36.8 C)] 97.9 F (36.6 C) (11/10 0612) Pulse Rate:  [76-109] 78 (11/10 0612) Resp:  [18-19] 19 (11/09 2100) BP: (107-156)/(61-98) 156/86 mmHg (11/10 0612) SpO2:  [96 %-100 %] 100 % (11/10 0612) Last BM Date: 06/17/13   IS: (=)   Laboratory  CBC  Recent Labs  06/17/13 1215 06/18/13 0533  WBC 6.6 6.5  HGB 12.0* 11.9*  HCT 34.8* 34.4*  PLT 188 183   BMET  Recent Labs  06/17/13 1215 06/18/13 0533  NA 142 143  K 3.6 3.5  CL 104 105  CO2 26 27  GLUCOSE 114* 96  BUN 64* 48*  CREATININE 1.56* 1.08  CALCIUM 8.9 8.8    Physical Exam General appearance: alert and no distress Resp: clear to auscultation bilaterally Cardio: regular rate and rhythm GI: normal findings: bowel sounds normal and soft, non-tender   Assessment/Plan: Fall  R post rib FX 8-10 with small HPTX - Continue to work on pulmonary toilet.  Multiple medical problems -- Home meds  Confusion -- Will get HCT to r/o a new event. The rest of workup has been negative. It's possible the tramadol may be causing it, will d/c and try tylenol only. FEN -- Add Florastor, d/c Colace VTE - SCD's, Lovenox  Dispo -- If HCT negative I think he can d/c to SNF    Freeman Caldron, PA-C Pager: [phone] General Trauma PA Pager: [phone]   06/18/2013

## 2013-06-18 NOTE — Discharge Summary (Signed)
Physician Discharge Summary  Patient ID: Christopher [name] MRN: [id number] DOB/AGE: 1923/12/06 79 y.o.  Admit date: 06/12/2013 Discharge date: 06/18/2013  Discharge Diagnoses Patient Active Problem List   Diagnosis Date Noted  . Fall 06/12/2013  . Multiple fractures of ribs of right side 06/12/2013  . Traumatic hemopneumothorax 06/12/2013  . Medicare annual wellness visit, subsequent 02/16/2013  . Syncope 02/16/2013  . Insomnia 02/16/2013  . Constipation 02/16/2013  . DOE (dyspnea on exertion) 01/17/2013  . Oropharyngeal dysphagia 08/23/2012  . 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 Palpitations 06/13/2012  . MDD (major depressive disorder), single episode 06/13/2012  . CVA (cerebral infarction) 04/20/2012  . Second degree AV block 04/17/2012  . Compression fracture 04/02/2012  . HIATAL HERNIA 10/27/2007  . HEMORRHOIDS, INTERNAL 05/01/2007  . DIVERTICULOSIS, COLON 05/01/2007  . HYPERLIPIDEMIA 01/06/2007  . HYPERTENSION 01/06/2007  . GERD 01/06/2007  . OSTEOPOROSIS 01/06/2007  . ROSACEA 01/05/2007  . PROSTATE SPECIFIC ANTIGEN, ELEVATED 01/05/2007    Consultants None   Procedures None   HPI: Christopher [name] presented to the ED after a fall in the shower. He slipped getting out of shower and fell on his right side. He remembers the event. He was unable to stand and crawled to call 911. His workup included CT scans of the chest, abdomen, and pelvis and showed the rib fractures with a small hemopneumothorax. He was admitted to the trauma service for pain control and pulmonary toilet.   Hospital Course: The patient maintained his respiratory status with the aid of supplemental oxygen and did not decline further. His pain was controlled on oral medications. He was mobilized with physical and occupational therapies who recommended skilled nursing facility placement. During the search for an appropriate facility he began to have some intermittent confusion. A thorough workup was done  which failed to identify any causative agent. It was thought this was multifactorial and could be pursued with his primary care provider if it did not resolve. He was discharged in stable condition.      Medication List    STOP taking these medications       traMADol 50 MG tablet  Commonly known as:  ULTRAM      TAKE these medications       acetaminophen 325 MG tablet  Commonly known as:  TYLENOL  Take 2 tablets (650 mg total) by mouth every 4 (four) hours as needed for mild pain.     atorvastatin 10 MG tablet  Commonly known as:  LIPITOR  Take 10 mg by mouth daily.     clopidogrel 75 MG tablet  Commonly known as:  PLAVIX  Take 75 mg by mouth daily.     esomeprazole 40 MG capsule  Commonly known as:  NEXIUM  Take 40 mg by mouth daily.     Flax Seed Oil 1000 MG Caps  Take 1 capsule by mouth every evening.     furosemide 20 MG tablet  Commonly known as:  LASIX  Take 20 mg by mouth daily.     hydrochlorothiazide 12.5 MG capsule  Commonly known as:  MICROZIDE  Take 12.5 mg by mouth daily.     lisinopril 5 MG tablet  Commonly known as:  PRINIVIL,ZESTRIL  Take 5 mg by mouth 2 (two) times daily.     loratadine 10 MG tablet  Commonly known as:  CLARITIN  Take 10 mg by mouth daily.     Melatonin 10 MG Tabs  Take 1 tablet by mouth at bedtime.  multivitamin with minerals Tabs tablet  Take 1 tablet by mouth daily.     naproxen 500 MG tablet  Commonly known as:  NAPROSYN  Take 1 tablet (500 mg total) by mouth 2 (two) times daily with a meal.     potassium chloride SA 20 MEQ tablet  Commonly known as:  K-DUR,KLOR-CON  Take 20 mEq by mouth daily.     saccharomyces boulardii 250 MG capsule  Commonly known as:  FLORASTOR  Take 1 capsule (250 mg total) by mouth 2 (two) times daily.     sertraline 25 MG tablet  Commonly known as:  ZOLOFT  Take 25 mg by mouth daily.     Vitamin D 1000 UNITS capsule  Take 1,000 Units by mouth every evening.              Follow-up Information   Follow up with Eustaquio Boyden, MD. Schedule an appointment as soon as possible for a visit in 2 weeks.   Specialty:  Family Medicine   Contact information:   [address] [place] [address] [phone]       Call [facility]. (As needed)    Contact information:   [address] [place] [address] [phone]       Signed: Freeman Caldron, PA-C Pager: [phone] General Trauma PA Pager: [phone]  06/18/2013, 2:33 PM

## 2013-06-18 NOTE — Progress Notes (Signed)
Physical Therapy Treatment Patient Details Name: Christopher [name] MRN: [id number] DOB: [birth year] Today's Date: 06/18/2013 Time: 6962-9528 PT Time Calculation (min): 27 min  PT Assessment / Plan / Recommendation  History of Present Illness  Pt is an 79 year old male admitted after fall in shower at home resulting in small volume of pneumothorax and acute fractures of the posterolateral aspects of the right 8th and 9th ribs, as well as the posterior aspect of the right 10th rib   PT Comments   Pt progressing towards physical therapy goals. Decreased SOB noted today with gait training, and pt was able to maintain pursed-lip breathing throughout. Ambulated on room air with O2 saturation remaining at 93%, however O2 donned at end of session per nurse's request. Pt described reasons as to why he feels he falls, as pt had fall yesterday. He states that he becomes confused easily when he is alone and there is no one to talk to, and that his eyes "play tricks on him" as he sees movement in the floor and different "levels" which makes him think he needs to step up or down.   Follow Up Recommendations  Supervision for mobility/OOB;SNF     Does the patient have the potential to tolerate intense rehabilitation     Barriers to Discharge        Equipment Recommendations  None recommended by PT    Recommendations for Other Services    Frequency Min 3X/week   Progress towards PT Goals Progress towards PT goals: Progressing toward goals  Plan Current plan remains appropriate    Precautions / Restrictions Precautions Precautions: Fall Precaution Comments: monitor sats Restrictions Weight Bearing Restrictions: No   Pertinent Vitals/Pain Pt does not report any pain throughout session.     Mobility  Bed Mobility Bed Mobility: Not assessed Details for Bed Mobility Assistance: Pt received standing at EOB with nursing staff Transfers Transfers: Sit to Stand;Stand to Sit Sit to Stand: 4: Min  guard;From bed;With upper extremity assist Stand to Sit: 4: Min guard;To chair/3-in-1;With upper extremity assist Details for Transfer Assistance: Pt demonstrated proper hand placement and safety awareness. Ambulation/Gait Ambulation/Gait Assistance: 4: Min guard Ambulation Distance (Feet): 100 Feet Assistive device: Rolling walker Ambulation/Gait Assistance Details: Pt ambulated on room air maintaining O2 saturation of 93%. VC's for improved posture and to look up.  Gait Pattern: Step-through pattern;Decreased stride length;Trunk flexed Gait velocity: decreased Stairs: No    Exercises     PT Diagnosis:    PT Problem List:   PT Treatment Interventions:     PT Goals (current goals can now be found in the care plan section) Acute Rehab PT Goals Patient Stated Goal: Pt wants to go to rehab and then home. PT Goal Formulation: With patient Time For Goal Achievement: 06/26/13 Potential to Achieve Goals: Good  Visit Information  Last PT Received On: 06/18/13 Assistance Needed: +1 History of Present Illness:  Pt is an 79 year old male admitted after fall in shower at home resulting in small volume of pneumothorax and acute fractures of the posterolateral aspects of the right 8th and 9th ribs, as well as the posterior aspect of the right 10th rib    Subjective Data  Subjective: "Sometimes when I look at the floor it seems like there is multiple levels to it." - As pt was describing why he feels like he falls sometimes Patient Stated Goal: Pt wants to go to rehab and then home.   Cognition  Cognition Arousal/Alertness: Awake/alert Behavior During  Therapy: WFL for tasks assessed/performed Overall Cognitive Status: Impaired/Different from baseline Area of Impairment: Awareness Memory: Decreased short-term memory General Comments: Pt describing being confused frequently and seeing level changes in the floor when he looks down.     Balance  Balance Balance Assessed: Yes Static  Standing Balance Static Standing - Balance Support: No upper extremity supported Static Standing - Level of Assistance: 5: Stand by assistance Dynamic Standing Balance Dynamic Standing - Balance Support: No upper extremity supported Dynamic Standing - Level of Assistance: 4: Min assist  End of Session PT - End of Session Equipment Utilized During Treatment: Gait belt;Oxygen Activity Tolerance: Patient limited by fatigue Patient left: in chair;with call bell/phone within reach;with family/visitor present Nurse Communication: Mobility status   GP     Ruthann Cancer 06/18/2013, 4:52 PM  Ruthann Cancer, PT, DPT [phone]

## 2013-06-18 NOTE — Progress Notes (Signed)
CSW spoke with Tammy at [facility] [phone] concerning Pt ability to d/c to facility today, and Pt is ok to come to facility today. Facility is contacting son for signatures and will move forward with d/c planning to SNF once Pt medically ready.   CSW will continue to follow Pt for d/c planning.   Leron Croak  MSW, LCSWA  [facility]   [phone] Trauma phone

## 2013-06-18 NOTE — Telephone Encounter (Signed)
Pt discharged from hospital today after fall with mult rib fx - plz call to see if pt went to SNF or home, and offer f/u appt in 2 wks. Thanks.

## 2013-06-18 NOTE — Progress Notes (Signed)
Patient has had some moments of confusion.  Currently he seems to be fine.  Going to get CT of his head now.  Will check on him later today.  This patient has been seen and I agree with the findings and treatment plan.  Marta Lamas. Gae Bon, MD, FACS [phone] (pager) [phone] (direct pager) Trauma Surgeon

## 2013-06-20 ENCOUNTER — Encounter: Payer: Self-pay | Admitting: Nurse Practitioner

## 2013-06-20 ENCOUNTER — Non-Acute Institutional Stay (SKILLED_NURSING_FACILITY): Payer: Medicare Other | Admitting: Nurse Practitioner

## 2013-06-20 DIAGNOSIS — I10 Essential (primary) hypertension: Secondary | ICD-10-CM

## 2013-06-20 DIAGNOSIS — R0781 Pleurodynia: Secondary | ICD-10-CM

## 2013-06-20 DIAGNOSIS — T50905D Adverse effect of unspecified drugs, medicaments and biological substances, subsequent encounter: Secondary | ICD-10-CM

## 2013-06-20 DIAGNOSIS — S2231XD Fracture of one rib, right side, subsequent encounter for fracture with routine healing: Secondary | ICD-10-CM

## 2013-06-20 DIAGNOSIS — I441 Atrioventricular block, second degree: Secondary | ICD-10-CM

## 2013-06-20 DIAGNOSIS — IMO0001 Reserved for inherently not codable concepts without codable children: Secondary | ICD-10-CM

## 2013-06-20 DIAGNOSIS — I6990 Unspecified sequelae of unspecified cerebrovascular disease: Secondary | ICD-10-CM

## 2013-06-20 DIAGNOSIS — J942 Hemothorax: Secondary | ICD-10-CM

## 2013-06-20 DIAGNOSIS — J90 Pleural effusion, not elsewhere classified: Secondary | ICD-10-CM

## 2013-06-20 DIAGNOSIS — E789 Disorder of lipoprotein metabolism, unspecified: Secondary | ICD-10-CM

## 2013-06-20 DIAGNOSIS — R079 Chest pain, unspecified: Secondary | ICD-10-CM

## 2013-06-20 MED ORDER — TRAMADOL HCL 50 MG PO TABS: 50.0000 mg | ORAL_TABLET | Freq: Four times a day (QID) | ORAL | Status: DC

## 2013-06-20 NOTE — Progress Notes (Signed)
Subjective:    Patient ID: Christopher [name], male    DOB: [birth year], 79 y.o.   MRN: [id number]  CODE STATUS: Full code  No known diagnosed allergies  HPI \ On 06/12/2013, the patient fell in his shower hitting his right side. He was unable to stand and crawl to call 911. He was seen in the emergency room and admitted. He reports no loss of consciousness.    Review of Systems  Cardiovascular: Positive for leg swelling.  Neurological:  No complaint of dizziness or headaches Gastrointestinal:  No c/o nausea, vomiting, or diarrhea.  Reports typical small intake at meals, but prefers small frequent snacks. Musculoskeletal:  Recently fractured ribs with pain along the R anterior, lateral, and posterior chest.   Psychiatric:  C/o insomnia   Medical History: Fall Multiple fractures of the right-sided ribs Traumatic hemopneumothorax Lipid metabolism disorder Hypertension Internal hemorrhoids GERD Hiatal hernia Diverticulosis of the colon Osteoporosis Compression fracture History of PSA elevation Second degree AV block, ejection fraction 60-65%, moderate left ventricular hypertrophy. Aortic sclerosis without stenosis Past CVA Past episode of major depressive disorder Skin cancer, left urine status post surgery. Oral pharyngeal dysphasia History of syncope Insomnia, chronic Constipation Bilateral sensorineural hearing loss  History: Cholecystectomy Inguinal hernia repair Esophagogastroduodenoscopy Colonoscopy exercise treadmill DEXA 2013 with a T score of -2.8 at the hip Removal of cancer from left ear with tissue rearrangement, skin graft full-thickness 2014 Echocardiography 2014  Current medications: Protonix 40 mg twice a day Potassium 20 mEq a day Zoloft 25 mg a day Tramadol 50 mg one by mouth 4 times a day Tramadol sustained release 100 mg at bedtime Vicodin 5/325 one every 6 hours as needed for moderate pain Vicodin 5/325 2 every 6 hours as needed for severe  pain. Proventil 12.5 mg via nebulizer every 2 hours with shortness of breath or wheezing Lipitor 10 mg each p.m. Plavix started 75 mg per day Colace 100 mg twice a day Lovenox 40 mg/0.4 mL was subcutaneously each day Lasix 20 mg 1 each day Microzide 12.5 mg per day Lisinopril 5 mg twice a day Claritin 10 mg per day Dulcolax suppository per rectum, 10 mg, as needed Combivent 1 puff inhaled every 6 hours as needed Zofran 4 mg every 6 hours as needed for nausea. O2 at 4 liters via [place]  Recent labs: 06/12/2013 CBC with differential WBC 11.8 RBC 58 Hemoglobin 15.1 Hematocrit 43.3 MCV 94.5 MCH 33.6 MCHC 35.6 RDW 13.6 Platelets 190 Neutrophils absolute 8.6 Lymphocytes absolute 2.0 Monocytes absolute 0.9 Eosinophils absolute 0.3 Basophils absolute 0  Basic metabolic panel sodium 136 Potassium 4.3 Chloride 101 CO2 25 Glucose 173 BUN 24 Creatinine 1.13 Calcium 9.5  INR 1.01  06/17/2013 BUN 64 Creatinine 1.56  06/18/2013 BUN 48 Creatinine 1.08  CT scan of chest, with contrast, done 06/12/2013 Showed heart size is mildly enlarged. No significant pericardial fluid, acne or pericardial calcification. There is atherosclerosis of the thoracic aorta, the great vessels of the mediastinum, and the coronary arteries, including calcified atherosclerotic plaque in the left main, left anterior descending, left circumflex, and right coronary arteries. Calcifications of the aortic valve and mitral annulus. Aneurysmal dilation of the ascending thoracic aorta which measures up to 4.6 cm in diameter. No evidence of thoracic aortic section at this time. Massive hiatal hernia. The entire stoma appears in thoracic.  Lung/pleural: Trace amounts of intermediate attenuation fluid in the posterior aspect of the right hemithorax, likely to represen tproteinaceous or mildly  hemorrhagic pleural effusion. There is a  small volume of pneumothorax. There are multiple areas of pleural lightninglike in  the posterior aspect of the right oral compatible with a little related to multiple overlying. Some mild passive atelectasis is noted in the right lower lobe. There is also some passive atelectasis in the left lower lobe adjacent to large hiatal hernia.  this consolidative airspace disease, small amount of scarring in the inferior segment of the lingula.  Upper abdomen: Status post cholecystectomy mild intra-and extrahepatic the latter he ductal dilation which appears to be chronic. Small lesion in segment 2 of the liver compatible with a small cyst.   Musculoskeletal: Acute fractures of the posterior/lateral aspects of the right eighth and ninth ribs as well as the posterior aspect of the right 10th rib. Subcutaneous emphysema of the right chest adjacent to these fractures. Old compression fractures at T7, T. 11, and L1. Impression:  Multiple displaced fractures of the right eighth ninth and 10th ribs posterior/lateral leg with small amount of subpleural hemorrhage and small right hemopneumothorax as above. Mild aneurysmal dilation of the ascending thoracic aorta. Large hiatal hernia. Atherosclerosis as evidenced initial findings.   Vital signs: Temp 97.7 Heart rate 79 Respiratory rate 16 Blood pressure 129/81 Pulse oximetry 96% Weight: 167 lbs       Physical Exam  General: Neat and well groomed, alert, verbally appropriate, oriented x3 and able to do math. Eyes: Pupils are equally round and reactive to light, positive red reflex, extraocular movements intact. Conjunctiva are pale pink. Ears tympanic membranes are unremarkable. Oropharynx, multiple broken teeth, some gum line. No gross abscess is evident. Oral mucosa is moist and pink. No true cervical adenopathy, no thyromegaly. Carotid bruits are not present. Apical pulse is with intermittently irregular rhythm, no true distinct murmur. Bilateral breath sounds are essentially clear but slightly diminished. Abdomen positive bowel  sounds essentially soft and nontender. Bilateral lower extremities, posterior tibial pulses are. Edema noted at right medial ankle trace to 1+, ending  with trace well below the knee.  independent mobility and motion of all 4 extremities.  Integumentary, large ecchymotic area noted at the right inner aspect of the arm. Patient states it does not hurt. Right chest wall extensive ecchymoses extending over the right anterior lateral and posterior chest. Area is very tender to palpation. Hemosiderin staining noted on lower legs bilaterally, left greater than right. GU, clear pale yellow urine noted in urinal Patient currently rates his pain at 5/10, along his right anterior/lateral/posterior ribs.         Assessment & Plan:  Multiple rib fractures, right/lateral/posterior chest, predominantly involving #8 #9 ribs, with hemopneumothorax. We'll maintain patient on 4 L of oxygen via nasal cannula. His pulse oximetry is to be measured once per shift.   In the hospital the patient was receiving naproxen twice a day 500 mg, decision made to not administer naproxen in the facility related to patient's age and concern related to renal function. The patient is also on lisinopril, and nonsteroidal anti-inflammatories can potentially block therapeutic effect of the ACE inhibitors.  The patient also has a significant history of GERD requiring Protonix twice a day, so would like to defer any nonsteroidal anti-inflammatories at this time.  We'll continue with tramadol 50 mg, 4 times a day and institute a 100 mg sustained release tablet at bedtime. The patient also has Vicodin, 5/325, 1 or 2 tablets every 6 hours as needed. She is on diuretics, Microzide and Lasix, we'll certainly continue his potassium supplement.  To facilitate adequate lung  expansion, and help prevent any wheezing or shortness of breath, certainly continue the Proventil and his Combivent inhaler. For his hypertension we'll continue the  lisinopril Related to his history of stroke, will continue the Plavix. The patient is on Lovenox, we'll need to determine the length of therapy for this. We'll certainly continue Plavix as pt has history of past stroke For constipation which the patient has a chronic history we will certainly continue the Dulcolax suppository as needed. Colace will also be continued. Will implement a small dose of Ambien for his insomnia Very carefully monitor renal function as the patient had some dehydration as evidenced by elevated creatinine and BUN in the hospital. We'll order CMP and CBC for 06/21/2013. Dietary, to facilitate healing will implement Medpass, 120 ml twice a day. Will write for dietary to send fruit juice at every meal, and offer between meal snacks such as pudding or other patient preferences.

## 2013-06-20 NOTE — Progress Notes (Deleted)
Date: 06/20/2013  MRN:  [id number] Name:  Christopher [name] Sex:  male Age:  79 y.o. DOB:[birth year]   PSC #:                       Facility/Room; Level Of Care: Provider:   Emergency Contacts: Contact Information   Name Relation Home Work Pound Son   [phone]   Gillian Scarce Daughter   [phone]      Code Status: MOST Form:  Allergies:No Known Allergies   No chief complaint on file.    HPI:  Past Medical History  Diagnosis Date  . GERD (gastroesophageal reflux disease) 08/1996  . Hyperlipemia 08/1986  . Hypertension Pre 1996  . Osteoporosis 12/2004    dexa T -2.8 (05/2012), T11 compression fx  . Pneumonia 22 yoa  . History of Doppler echocardiogram 05/1995    MVP, MOD AI, MILD TR, MILD MR  . Elevated PSA     decided against further eval  . Diverticulosis   . Bilateral sensorineural hearing loss 02/2012    some conductive left side  . Compression fracture     multiple  . AV block, 2nd degree 04/2012    seen cards, monitor for now, pt does not desire monitor/pacer  . BCC (basal cell carcinoma of skin) 08/2012    s/p excision Terri Piedra)  . HOH (hard of hearing)   . Depression   . Chronic kidney disease     elevated PSA-no tx  . CVA (cerebral infarction) 04/20/2012    04/16/12 IMPRESSION:  1. 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 No mass effect or hemorrhage. Basilar artery perforator territory.  2. See MRA findings below.  3. Otherwise stable MRI appearance of the brain since 2011 including extensive perivascular spaces (etat crible appearance).    . Stroke 04/2012    "left me problems; can't say what they are" (06/14/2013)  . Fall at home 06/12/2013    multiple rib fractures; traumatic hemopneumothorax/notes 06/12/2013 (06/14/2013)    Past Surgical History  Procedure Laterality Date  . Cholecystectomy  05/1995  . Exercise treadmill  09/12/2006    NML  . Esophagogastroduodenoscopy  08/18/2004    H. H. Dr. Russella Dar  .  Colonoscopy  05/01/2007    divericulosis, int hemorrhoids - Dr. Russella Dar  . Dexa  05/2012    T score -2.8 hip, T11 compression fx  . Skin full thickness graft Left 10/19/2012    Procedure: REPAIR OF LEFT EAR WITH TISSUE REARRANGEMENT SKIN GRAFT FULL THICKNESS;  Surgeon: Wayland Denis, DO;  Location: [facility];  Service: Plastics;  Laterality: Left;  . US echocardiography  05/2013    Mod LVH, EF 60-65%, indeterminate diastolic fxn, aortic sclerosis without stenosis, mildly dilated RV with nl sys fxn, mod dilated AA  . Inguinal hernia repair Left 08/14/2010    Dr. Daphine Deutscher  . Cataract extraction w/ intraocular lens  implant, bilateral Bilateral      Procedures: Consultants:  Current Outpatient Prescriptions  Medication Sig Dispense Refill  . acetaminophen (TYLENOL) 325 MG tablet Take 2 tablets (650 mg total) by mouth every 4 (four) hours as needed for mild pain.      Marland Kitchen atorvastatin (LIPITOR) 10 MG tablet Take 10 mg by mouth daily.      . Cholecalciferol (VITAMIN D) 1000 UNITS capsule Take 1,000 Units by mouth every evening.       . clopidogrel (PLAVIX) 75 MG tablet Take 75 mg by mouth daily.      Marland Kitchen  esomeprazole (NEXIUM) 40 MG capsule Take 40 mg by mouth daily.      . Flaxseed, Linseed, (FLAX SEED OIL) 1000 MG CAPS Take 1 capsule by mouth every evening.       . furosemide (LASIX) 20 MG tablet Take 20 mg by mouth daily.   30 tablet  6  . hydrochlorothiazide (MICROZIDE) 12.5 MG capsule Take 12.5 mg by mouth daily.      Marland Kitchen lisinopril (PRINIVIL,ZESTRIL) 5 MG tablet Take 5 mg by mouth 2 (two) times daily.      Marland Kitchen loratadine (CLARITIN) 10 MG tablet Take 10 mg by mouth daily.      . Melatonin 10 MG TABS Take 1 tablet by mouth at bedtime.      . Multiple Vitamin (MULTIVITAMIN WITH MINERALS) TABS Take 1 tablet by mouth daily.      . naproxen (NAPROSYN) 500 MG tablet Take 1 tablet (500 mg total) by mouth 2 (two) times daily with a meal.      . potassium chloride SA (K-DUR,KLOR-CON) 20 MEQ  tablet Take 20 mEq by mouth daily.       Marland Kitchen saccharomyces boulardii (FLORASTOR) 250 MG capsule Take 1 capsule (250 mg total) by mouth 2 (two) times daily.      . sertraline (ZOLOFT) 25 MG tablet Take 25 mg by mouth daily.       No current facility-administered medications for this visit.    Immunization History  Administered Date(s) Administered  . Influenza Split 06/08/2011, 04/20/2012  . Influenza Whole 05/28/2006, 05/19/2007, 05/13/2008, 05/08/2009, 05/14/2010  . Influenza,inj,Quad PF,36+ Mos 05/10/2013  . Pneumococcal Polysaccharide 08/09/1996  . Td 01/07/2002, 02/15/2012  . Zoster 08/19/2008     Diet:  History  Substance Use Topics  . Smoking status: Former Smoker    Types: Cigarettes  . Smokeless tobacco: Former Neurosurgeon    Types: Chew     Comment: "06/14/2013 smoked & chewed in my teenage years"  . Alcohol Use: No    Family History  Problem Relation Age of Onset  . Heart disease Mother     MI  . Hypertension Mother   . Stroke Mother     ?  . Stroke Father   . Diabetes Sister   . Hypertension Sister   . Hypertension Sister   . Hypertension Sister   . Stroke Sister     recurrent mini strokes  . Cancer Neg Hx      {Ros - complete:30496}  Vital signs: There were no vitals taken for this visit.  {exam, Complete:18323}  Screening Score  MMS    PHQ2    PHQ9     Fall Risk    BIMS    Annual summary: Hospitalizations:  Problem List as of 06/20/2013     Cardiovascular and Mediastinum   HYPERTENSION   Last Assessment & Plan   05/10/2013 Office Visit Written 05/10/2013 10:41 AM by Eustaquio Boyden, MD     Script for bp cuff provided today.    HEMORRHOIDS, INTERNAL   Last Assessment & Plan   02/11/2011 Office Visit Written 02/11/2011  9:02 AM by Arta Silence, MD     No problems, decompressed on today's exam.    Second degree AV block   Last Assessment & Plan   07/14/2012 Office Visit Edited 07/16/2012  5:17 PM by Hillis Range, MD     Asymptomatic,  without any presyncope or syncope Refer to my initial consult note for full details. Given his advanced age and comorbidities, I would  favor conservative management going forward.  The patient is clear that he would like to avoid PPM also. He will contact my office if he has presyncope, syncope, or any abrupt changes in his energy/ exercise tolerance.  Avoid AV nodal agents going forward.    Syncope   Last Assessment & Plan   02/15/2013 Office Visit Edited 02/16/2013  6:49 AM by Eustaquio Boyden, MD     Isolated episode last week in known h/o 2nd degree AVB. I suggested he return to see cardiologist Dr. Johney Frame.      Respiratory   HIATAL HERNIA   Last Assessment & Plan   02/11/2011 Office Visit Written 02/11/2011  9:01 AM by Arta Silence, MD     Stable.    Oropharyngeal dysphagia   Last Assessment & Plan   08/22/2012 Office Visit Written 08/23/2012  7:04 PM by Eustaquio Boyden, MD     Endorses oropharyngeal dysphagia to liquids but not solids. Likely sequela of stroke. Given some concern with possible aspiration - will obtain MBS to dictate management. Was transitioned to regular diet prior to discharge from comprehensive inpatient rehab.    Traumatic hemopneumothorax     Digestive   GERD   Last Assessment & Plan   02/11/2011 Office Visit Written 02/11/2011  9:02 AM by Arta Silence, MD     Stable.    DIVERTICULOSIS, COLON   Last Assessment & Plan   02/11/2011 Office Visit Written 02/11/2011  9:01 AM by Arta Silence, MD     Discussed being seen for prolonged LLQ pain.    Constipation   Last Assessment & Plan   02/15/2013 Office Visit Written 02/16/2013  6:47 AM by Eustaquio Boyden, MD     With endorsed dark stools - stool card in office negative for occult blood.  Regardless, sent home with iFOB given plavix use.      Nervous and Auditory   CVA (cerebral infarction)   Last Assessment & Plan   08/22/2012 Office Visit Written 08/23/2012  7:07 PM by Eustaquio Boyden, MD      Has completed rehab. See above for dysphagia plan.    Localized cancer of skin of ear   Last Assessment & Plan   08/22/2012 Office Visit Written 08/23/2012  7:07 PM by Eustaquio Boyden, MD     Follows with Dr. Terri Piedra. Pending biopsy results.      Musculoskeletal and Integument   ROSACEA   OSTEOPOROSIS   Last Assessment & Plan   02/15/2013 Office Visit Written 02/16/2013  6:44 AM by Eustaquio Boyden, MD     Continue calcium/vit D and fosamax.    Compression fracture   Last Assessment & Plan   08/22/2012 Office Visit Written 08/23/2012  7:05 PM by Eustaquio Boyden, MD     Has been cleared by rehab physician, off thoracic brace as well per neurosurgery. Pain manageable.    Multiple fractures of ribs of right side     Other   HYPERLIPIDEMIA   Last Assessment & Plan   02/15/2013 Office Visit Written 02/16/2013  6:42 AM by Eustaquio Boyden, MD     Reviewed #s with patient - LDL elevated, recommended start lipitor given h/o stroke.    PROSTATE SPECIFIC ANTIGEN, ELEVATED   Last Assessment & Plan   02/15/2013 Office Visit Written 02/16/2013  6:44 AM by Eustaquio Boyden, MD     Recheck next blood work.  Known elevated PSA and BPH.  Denies LUTS.    Palpitations   Last Assessment &  Plan   08/22/2012 Office Visit Written 08/23/2012  7:06 PM by Eustaquio Boyden, MD     Pt has chosen conservative management, currently remains asxs     MDD (major depressive disorder), single episode   Last Assessment & Plan   08/22/2012 Office Visit Written 08/23/2012  7:06 PM by Eustaquio Boyden, MD     Improved on sertraline 25mg  daily.  Will continue med.    DOE (dyspnea on exertion)   Last Assessment & Plan   05/10/2013 Office Visit Edited 05/10/2013 10:45 AM by Eustaquio Boyden, MD     CXR from 02/2013 reviewed.  EKG from 02/2013 reviewed as well.  ER visit records reviewed. In setting of recent elevated BNP and worsening DOE, concern for new onset CHF.   Last echo from 04/2012 with normal EF and overall normal  heart function. Given worsening dyspnea, I will order rpt echocardiogram, start lasix 20mg  daily, and have pt return in 1 week to check Cr on new daily lasix dose. If unrevealing w/u, will recommend return to Dr. Tillie Rung for further eval.  Pt agrees with plan.    Medicare annual wellness visit, subsequent   Last Assessment & Plan   02/15/2013 Office Visit Written 02/16/2013  6:41 AM by Eustaquio Boyden, MD     I have personally reviewed the Medicare Annual Wellness questionnaire and have noted 1. The patient's medical and social history 2. Their use of alcohol, tobacco or illicit drugs 3. Their current medications and supplements 4. The patient's functional ability including ADL's, fall risks, home safety risks and hearing or visual impairment. 5. Diet and physical activity 6. Evidence for depression or mood disorders The patients weight, height, BMI have been recorded in the chart.  Hearing and vision has been addressed. I have made referrals, counseling and provided education to the patient based review of the above and I have provided the pt with a written personalized care plan for preventive services. See scanned questionairre. Advanced directives discussed: to bring me copy of advanced directive.  Daughter is HCPOA.  Reviewed preventative protocols and updated unless pt declined. Sent home with iFOB.  DRE with evidence of BPH today.  Check PSA next blood draw.    Insomnia   Last Assessment & Plan   02/15/2013 Office Visit Written 02/16/2013  6:46 AM by Eustaquio Boyden, MD     Suggested trial of benadryl or melatonin OTC    Fall     Infection History:  Functional assessment: Areas of potential improvement: Rehabilitation Potential:Plan: June 20, 2013  [facility]  Code Status:  Full Code  Allergies:  NKDA

## 2013-06-20 NOTE — Telephone Encounter (Signed)
Pt d/c'd to SNF (unable to complete transitional care call).  According to pt's son, he will be there for rehab x 10-20 days.  Appt scheduled with pt's daughter, Bonita Quin.  If pt gets out before or after appt, she will reschedule accordingly.

## 2013-06-21 ENCOUNTER — Encounter: Payer: Self-pay | Admitting: Nurse Practitioner

## 2013-06-21 ENCOUNTER — Non-Acute Institutional Stay (SKILLED_NURSING_FACILITY): Payer: Medicare Other | Admitting: Internal Medicine

## 2013-06-21 DIAGNOSIS — R269 Unspecified abnormalities of gait and mobility: Secondary | ICD-10-CM

## 2013-06-21 DIAGNOSIS — F329 Major depressive disorder, single episode, unspecified: Secondary | ICD-10-CM

## 2013-06-21 DIAGNOSIS — IMO0002 Reserved for concepts with insufficient information to code with codable children: Secondary | ICD-10-CM

## 2013-06-21 DIAGNOSIS — K219 Gastro-esophageal reflux disease without esophagitis: Secondary | ICD-10-CM

## 2013-06-21 DIAGNOSIS — E785 Hyperlipidemia, unspecified: Secondary | ICD-10-CM

## 2013-06-21 DIAGNOSIS — F3289 Other specified depressive episodes: Secondary | ICD-10-CM

## 2013-06-21 DIAGNOSIS — D72819 Decreased white blood cell count, unspecified: Secondary | ICD-10-CM | POA: Insufficient documentation

## 2013-06-21 DIAGNOSIS — G4700 Insomnia, unspecified: Secondary | ICD-10-CM

## 2013-06-21 DIAGNOSIS — S2249XA Multiple fractures of ribs, unspecified side, initial encounter for closed fracture: Secondary | ICD-10-CM | POA: Insufficient documentation

## 2013-06-21 DIAGNOSIS — I6990 Unspecified sequelae of unspecified cerebrovascular disease: Secondary | ICD-10-CM | POA: Insufficient documentation

## 2013-06-21 DIAGNOSIS — R2681 Unsteadiness on feet: Secondary | ICD-10-CM

## 2013-06-21 DIAGNOSIS — I6350 Cerebral infarction due to unspecified occlusion or stenosis of unspecified cerebral artery: Secondary | ICD-10-CM

## 2013-06-21 DIAGNOSIS — I639 Cerebral infarction, unspecified: Secondary | ICD-10-CM | POA: Insufficient documentation

## 2013-06-21 DIAGNOSIS — S2241XK Multiple fractures of ribs, right side, subsequent encounter for fracture with nonunion: Secondary | ICD-10-CM

## 2013-06-21 DIAGNOSIS — I10 Essential (primary) hypertension: Secondary | ICD-10-CM

## 2013-06-21 DIAGNOSIS — K5900 Constipation, unspecified: Secondary | ICD-10-CM

## 2013-06-21 NOTE — Progress Notes (Signed)
Error entry

## 2013-06-21 NOTE — Progress Notes (Signed)
Patient ID: Christopher [name], male   DOB: [birth year], 79 y.o.   MRN: [id number]  [facility]  PCP: Eustaquio Boyden, MD  Code Status: full code  No Known Allergies  Chief Complaint: new admit  HPI:  79 y/o male patient in here for STR post hospital admission from 06/12/13- 06/18/13 . He had a fall during shower and hit his right side. He had multiple right sided rib fractures and a small hemopneumothorax. He was then admitted to trauma service and had pain control meds and pulmonary toileting. He was placed on oxygen to help with his breathing. He was assessed by therapy team and rehabilitation was recommended. Patient was seen in his room today. He complaints on right sided chest pain with deep breathing and weakness. No other complaints. See ROS Of note- as per staff pt was very weakn while working with therapy this morning had SBP in 90s and o2 sat of 88-89%.. It improved post rest  Review of Systems  Constitutional: Negative for fever, chills, weight loss, malaise/fatigue and diaphoresis.  HENT: Negative for congestion, hearing loss and sore throat.   Eyes: Negative for blurred vision, double vision and discharge.  Respiratory: Negative for cough, sputum production, shortness of breath and wheezing.  on o2 4 l/min Cardiovascular: Negative for chest pain, palpitations, orthopnea and leg swelling.  Gastrointestinal: Negative for heartburn, nausea, vomiting, abdominal pain, diarrhea and constipation.  Genitourinary: Negative for dysuria, urgency, frequency and flank pain.  Musculoskeletal: Negative for back pain, falls, joint pain and myalgias.  Skin: Negative for itching and rash.  Neurological: Positive for weakness. Negative for dizziness, tingling, focal weakness and headaches.  Psychiatric/Behavioral: Negative for depression and memory loss. The patient is not nervous/anxious.     Past Medical History  Diagnosis Date  . GERD (gastroesophageal reflux disease) 08/1996  .  Hyperlipemia 08/1986  . Hypertension Pre 1996  . Osteoporosis 12/2004    dexa T -2.8 (05/2012), T11 compression fx  . Pneumonia 22 yoa  . History of Doppler echocardiogram 05/1995    MVP, MOD AI, MILD TR, MILD MR  . Elevated PSA     decided against further eval  . Diverticulosis   . Bilateral sensorineural hearing loss 02/2012    some conductive left side  . Compression fracture     multiple  . AV block, 2nd degree 04/2012    seen cards, monitor for now, pt does not desire monitor/pacer  . BCC (basal cell carcinoma of skin) 08/2012    s/p excision Terri Piedra)  . HOH (hard of hearing)   . Depression   . Chronic kidney disease     elevated PSA-no tx  . CVA (cerebral infarction) 04/20/2012    04/16/12 IMPRESSION:  1. 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 No mass effect or hemorrhage. Basilar artery perforator territory.  2. See MRA findings below.  3. Otherwise stable MRI appearance of the brain since 2011 including extensive perivascular spaces (etat crible appearance).    . Stroke 04/2012    "left me problems; can't say what they are" (06/14/2013)  . Fall at home 06/12/2013    multiple rib fractures; traumatic hemopneumothorax/notes 06/12/2013 (06/14/2013)  . Late effects of CVA (cerebrovascular accident) 06/21/2013   Past Surgical History  Procedure Laterality Date  . Cholecystectomy  05/1995  . Exercise treadmill  09/12/2006    NML  . Esophagogastroduodenoscopy  08/18/2004    H. H. Dr. Russella Dar  . Colonoscopy  05/01/2007    divericulosis, int  hemorrhoids - Dr. Russella Dar  . Dexa  05/2012    T score -2.8 hip, T11 compression fx  . Skin full thickness graft Left 10/19/2012    Procedure: REPAIR OF LEFT EAR WITH TISSUE REARRANGEMENT SKIN GRAFT FULL THICKNESS;  Surgeon: Wayland Denis, DO;  Location: [facility];  Service: Plastics;  Laterality: Left;  . US echocardiography  05/2013    Mod LVH, EF 60-65%, indeterminate diastolic fxn, aortic sclerosis without  stenosis, mildly dilated RV with nl sys fxn, mod dilated AA  . Inguinal hernia repair Left 08/14/2010    Dr. Daphine Deutscher  . Cataract extraction w/ intraocular lens  implant, bilateral Bilateral    Social History:   reports that he has quit smoking. His smoking use included Cigarettes. He smoked 0.00 packs per day. He has quit using smokeless tobacco. His smokeless tobacco use included Chew. He reports that he does not drink alcohol or use illicit drugs.  Family History  Problem Relation Age of Onset  . Heart disease Mother     MI  . Hypertension Mother   . Stroke Mother     ?  . Stroke Father   . Diabetes Sister   . Hypertension Sister   . Hypertension Sister   . Hypertension Sister   . Stroke Sister     recurrent mini strokes  . Cancer Neg Hx     Medications: Patient's Medications  New Prescriptions   No medications on file  Previous Medications   ALBUTEROL (PROVENTIL) (2.5 MG/3ML) 0.083% NEBULIZER SOLUTION    Take 2.5 mg by nebulization every 2 (two) hours as needed for wheezing or shortness of breath.   ALBUTEROL-IPRATROPIUM (COMBIVENT) 18-103 MCG/ACT INHALER    Inhale 1 puff into the lungs every 6 (six) hours as needed for wheezing or shortness of breath.   ATORVASTATIN (LIPITOR) 10 MG TABLET    Take 10 mg by mouth daily.   CLOPIDOGREL (PLAVIX) 75 MG TABLET    Take 75 mg by mouth daily.   DOCUSATE SODIUM (COLACE) 100 MG CAPSULE    Take 100 mg by mouth 2 (two) times daily.   ENOXAPARIN (LOVENOX) 40 MG/0.4ML INJECTION    Inject 40 mg into the skin daily.   FUROSEMIDE (LASIX) 20 MG TABLET    Take 20 mg by mouth daily.    HYDROCHLOROTHIAZIDE (MICROZIDE) 12.5 MG CAPSULE    Take 12.5 mg by mouth daily.   HYDROCODONE-ACETAMINOPHEN (NORCO/VICODIN) 5-325 MG PER TABLET    Take 1 tablet by mouth every 6 (six) hours as needed for moderate pain.   HYDROCODONE-ACETAMINOPHEN (NORCO/VICODIN) 5-325 MG PER TABLET    Take 2 tablets by mouth every 6 (six) hours as needed for severe pain.    LISINOPRIL (PRINIVIL,ZESTRIL) 5 MG TABLET    Take 5 mg by mouth 2 (two) times daily.   LORATADINE (CLARITIN) 10 MG TABLET    Take 10 mg by mouth daily.   PANTOPRAZOLE (PROTONIX) 40 MG TABLET    Take 40 mg by mouth 2 (two) times daily before a meal.   POTASSIUM CHLORIDE SA (K-DUR,KLOR-CON) 20 MEQ TABLET    Take 20 mEq by mouth daily.    SERTRALINE (ZOLOFT) 25 MG TABLET    Take 25 mg by mouth daily.   TRAMADOL (ULTRAM) 50 MG TABLET    Take 1 tablet (50 mg total) by mouth 4 (four) times daily.   TRAMADOL HCL 100 MG CP24    Take by mouth.  Modified Medications   No medications on file  Discontinued Medications   No medications on file     Physical Exam:  Filed Vitals:   06/21/13 1501  BP: 140/72  Pulse: 86  Temp: 99 F (37.2 C)  Resp: 16  SpO2: 95%   General- elderly male in no acute distress Head- atraumatic, normocephalic Eyes- PERRLA, EOMI, no pallor, no icterus Neck- no lymphadenopathy, no thyromegaly, no jugular vein distension, no carotid bruit Chest- no chest wall deformities, no chest wall tenderness, large bruise in right lateral chest area Cardiovascular- normal s1,s2, no murmurs/ rubs/ gallops Respiratory- bilateral clear to auscultation, no wheeze, no rhonchi, no crackles Abdomen- bowel sounds present, soft, non tender Musculoskeletal- able to move all 4 extremities, no spinal and paraspinal tenderness, on wheelchair at present, generalized weakness noted Neurological- no focal deficit Psychiatry- alert and oriented to person, place and time, normal mood and affect  Labs reviewed: Basic Metabolic Panel:  Recent Labs  45/40/98 0046 06/12/13 0547 06/17/13 1215 06/18/13 0533  NA 136  --  142 143  K 4.3  --  3.6 3.5  CL 101  --  104 105  CO2 25  --  26 27  GLUCOSE 173*  --  114* 96  BUN 24*  --  64* 48*  CREATININE 1.13 1.02 1.56* 1.08  CALCIUM 9.5  --  8.9 8.8   Liver Function Tests:  Recent Labs  01/17/13 1250  AST 20  ALT 22  ALKPHOS 60  BILITOT  0.9  PROT 8.2  ALBUMIN 4.1   CBC:  Recent Labs  02/15/13 1818 06/12/13 0046 06/12/13 0547 06/17/13 1215 06/18/13 0533  WBC 10.0 11.8* 12.4* 6.6 6.5  NEUTROABS 6.7 8.6*  --   --  3.9  HGB 14.9 15.4 14.3 12.0* 11.9*  HCT 42.0 43.3 41.0 34.8* 34.4*  MCV 90.3 94.5 94.3 96.7 95.8  PLT 204 190 166 188 183   06/21/13 wbc 13.5, hb 12.6, hct 40.1, plt 287, na 139, k 3.9, bun 32, cr 1.1, glu 113, ca 9.2  Assessment/Plan  Rib fracture- pain under control. His naprosyn was discontinued in facility with impaired renal function and he was put on tramadol 50 mg qid with tramadol 100 mg at bedtime. He is allso on hydrocodone-apap 5-325 1-2 tab q4h prn pain. He is lederly male and on a lot of pain medication. Will d/c hydrocodone for now and have him on tramadol 50 mg po qid for now as need for pain and reassess.   cva- old cva, cntinue plavix and statin, bp under control at present  Gait instability- with fall in past, fall precautions, skin care. Will have him work with PT an PT for gait training  Hyperlipidemia- continue lipitor 10 mg daily, monitor clinically  Leukocytosis- elevated wbc on lab review. No foci of infection noted. Normal wbc in hospital. Send u/a with c/s to rule out uti. Will monitor for temperature. Recheck cbc with diff tomorrow. Normal lung exam at present. Monitor clinically  constipation- continue bisacodyl and dulcolax for now  Hypertension- bp is stable. Continue lasix 20 mg daily, hctz 12.5 mg daily, lisinopril 5 mg daily. Monitor bp readings  Allergic rhinitis- under control. Will stop his claritin given resolution of symptom and to decrease fall risk  GERD- continue nexium 40 mg daily for now and monitor clinically  Insomnia- was getting melatonin before, will have him on melatonin 10 mg daily for now  Depression- mood stable, continue zoloft 25 mg daily   Family/ staff Communication: reviewed care plan with patient and  nursing supervisor   Goals of  care: to return home   Labs/tests ordered: cbc with diff

## 2013-06-27 ENCOUNTER — Other Ambulatory Visit: Payer: Self-pay | Admitting: *Deleted

## 2013-06-27 MED ORDER — HYDROCODONE-ACETAMINOPHEN 5-325 MG PO TABS: ORAL_TABLET | ORAL | Status: DC

## 2013-07-04 ENCOUNTER — Non-Acute Institutional Stay (SKILLED_NURSING_FACILITY): Payer: Medicare Other | Admitting: Nurse Practitioner

## 2013-07-04 DIAGNOSIS — I6990 Unspecified sequelae of unspecified cerebrovascular disease: Secondary | ICD-10-CM

## 2013-07-04 DIAGNOSIS — IMO0002 Reserved for concepts with insufficient information to code with codable children: Secondary | ICD-10-CM

## 2013-07-04 DIAGNOSIS — S2231XD Fracture of one rib, right side, subsequent encounter for fracture with routine healing: Secondary | ICD-10-CM

## 2013-07-04 DIAGNOSIS — S2241XK Multiple fractures of ribs, right side, subsequent encounter for fracture with nonunion: Secondary | ICD-10-CM

## 2013-07-04 DIAGNOSIS — J942 Hemothorax: Secondary | ICD-10-CM

## 2013-07-04 DIAGNOSIS — R0781 Pleurodynia: Secondary | ICD-10-CM

## 2013-07-04 DIAGNOSIS — F329 Major depressive disorder, single episode, unspecified: Secondary | ICD-10-CM

## 2013-07-04 DIAGNOSIS — F3289 Other specified depressive episodes: Secondary | ICD-10-CM

## 2013-07-04 DIAGNOSIS — R079 Chest pain, unspecified: Secondary | ICD-10-CM

## 2013-07-04 DIAGNOSIS — IMO0001 Reserved for inherently not codable concepts without codable children: Secondary | ICD-10-CM

## 2013-07-04 DIAGNOSIS — J90 Pleural effusion, not elsewhere classified: Secondary | ICD-10-CM

## 2013-07-11 DIAGNOSIS — R079 Chest pain, unspecified: Secondary | ICD-10-CM

## 2013-07-11 DIAGNOSIS — Z9181 History of falling: Secondary | ICD-10-CM

## 2013-07-11 DIAGNOSIS — Z5189 Encounter for other specified aftercare: Secondary | ICD-10-CM

## 2013-07-11 DIAGNOSIS — S2249XA Multiple fractures of ribs, unspecified side, initial encounter for closed fracture: Secondary | ICD-10-CM

## 2013-07-13 ENCOUNTER — Encounter: Payer: Self-pay | Admitting: Family Medicine

## 2013-07-13 ENCOUNTER — Ambulatory Visit (INDEPENDENT_AMBULATORY_CARE_PROVIDER_SITE_OTHER): Payer: Medicare Other | Admitting: Family Medicine

## 2013-07-13 ENCOUNTER — Ambulatory Visit (INDEPENDENT_AMBULATORY_CARE_PROVIDER_SITE_OTHER)
Admission: RE | Admit: 2013-07-13 | Discharge: 2013-07-13 | Disposition: A | Discharge status: Home / Self Care | Payer: Medicare Other | Source: Ambulatory Visit | Attending: Family Medicine | Admitting: Family Medicine

## 2013-07-13 VITALS — BP 138/84 | HR 80 | Temp 98.1°F | Wt 168.0 lb

## 2013-07-13 DIAGNOSIS — R0609 Other forms of dyspnea: Secondary | ICD-10-CM

## 2013-07-13 DIAGNOSIS — B351 Tinea unguium: Secondary | ICD-10-CM | POA: Insufficient documentation

## 2013-07-13 DIAGNOSIS — IMO0001 Reserved for inherently not codable concepts without codable children: Secondary | ICD-10-CM

## 2013-07-13 DIAGNOSIS — R0600 Dyspnea, unspecified: Secondary | ICD-10-CM

## 2013-07-13 DIAGNOSIS — K219 Gastro-esophageal reflux disease without esophagitis: Secondary | ICD-10-CM

## 2013-07-13 DIAGNOSIS — I10 Essential (primary) hypertension: Secondary | ICD-10-CM

## 2013-07-13 DIAGNOSIS — S2241XD Multiple fractures of ribs, right side, subsequent encounter for fracture with routine healing: Secondary | ICD-10-CM

## 2013-07-13 NOTE — Assessment & Plan Note (Signed)
I recommended he start protonix 40mg  daily instead of nexium (given concurrent plavix use).

## 2013-07-13 NOTE — Assessment & Plan Note (Signed)
Seems to be healing well with pain well controlled on prn hydrocodone.

## 2013-07-13 NOTE — Progress Notes (Signed)
Pre-visit discussion using our clinic review tool. No additional management support is needed unless otherwise documented below in the visit note.  

## 2013-07-13 NOTE — Assessment & Plan Note (Signed)
With elongated toenails.  Will refer to podiatry for trimming per patient/family request.

## 2013-07-13 NOTE — Progress Notes (Signed)
Subjective:    Patient ID: Christopher [name], male    DOB: Aug 06, 1924, 79 y.o.   MRN: [id number]  HPI CC: hosp f/u  Presents with daughter today.  Recent hospitalization for fall at home in shower with multiple R sided rib fractures as well as R hemopneumothorax.  Stabilized in hospital and discharged to [facility].  Worked with PT on gait balance therapy.  Presents today for f/u.  Discharged 07/08/2013.  Now at home.  No D/C summary available.  meds reviewed with patient.  He is taking vanilla ensure once daily.  Since he's been home, feels weaker than prior.  Endorses increased dyspnea, wheezing.  This has been going on over last 2-3 days   Denies fevers/chills, cough, chest pain.  Using incentive spirometry. He was weaned off O2 at SNF Has both PT and OT HH set up.  Requests referral to podiatrist for thickened toenail evaluation - will refer today. Wt Readings from Last 3 Encounters:  07/13/13 168 lb (76.204 kg)  06/20/13 166 lb (75.297 kg)  06/14/13 165 lb (74.844 kg)    He is currently only taking hydrocodone for pain prn, not currently on tramadol so this was removed from med list.  Past Medical History  Diagnosis Date  . GERD (gastroesophageal reflux disease) 08/1996  . Hyperlipemia 08/1986  . Hypertension Pre 1996  . Osteoporosis 12/2004    dexa T -2.8 (05/2012), T11 compression fx  . Pneumonia 22 yoa  . History of Doppler echocardiogram 05/1995    MVP, MOD AI, MILD TR, MILD MR  . Elevated PSA     decided against further eval  . Diverticulosis   . Bilateral sensorineural hearing loss 02/2012    some conductive left side  . Compression fracture     multiple  . AV block, 2nd degree 04/2012    seen cards, monitor for now, pt does not desire monitor/pacer  . BCC (basal cell carcinoma of skin) 08/2012    s/p excision Terri Piedra)  . HOH (hard of hearing)   . Depression   . Chronic kidney disease     elevated PSA-no tx  . CVA (cerebral infarction) 04/20/2012    04/16/12  IMPRESSION:  1. 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 No mass effect or hemorrhage. Basilar artery perforator territory.  2. See MRA findings below.  3. Otherwise stable MRI appearance of the brain since 2011 including extensive perivascular spaces (etat crible appearance).    . Stroke 04/2012    "left me problems; can't say what they are" (06/14/2013)  . Fall at home 06/12/2013    multiple rib fractures; traumatic hemopneumothorax/notes 06/12/2013 (06/14/2013)  . Late effects of CVA (cerebrovascular accident) 06/21/2013    Past Surgical History  Procedure Laterality Date  . Cholecystectomy  05/1995  . Exercise treadmill  09/12/2006    NML  . Esophagogastroduodenoscopy  08/18/2004    H. H. Dr. Russella Dar  . Colonoscopy  05/01/2007    divericulosis, int hemorrhoids - Dr. Russella Dar  . Dexa  05/2012    T score -2.8 hip, T11 compression fx  . Skin full thickness graft Left 10/19/2012    Procedure: REPAIR OF LEFT EAR WITH TISSUE REARRANGEMENT SKIN GRAFT FULL THICKNESS;  Surgeon: Wayland Denis, DO;  Location: [facility];  Service: Plastics;  Laterality: Left;  . US echocardiography  05/2013    Mod LVH, EF 60-65%, indeterminate diastolic fxn, aortic sclerosis without stenosis, mildly dilated RV with nl sys fxn, mod dilated  AA  . Inguinal hernia repair Left 08/14/2010    Dr. Daphine Deutscher  . Cataract extraction w/ intraocular lens  implant, bilateral Bilateral    Review of Systems Per HPI    Objective:   Physical Exam  Nursing note and vitals reviewed. Constitutional: He appears well-developed and well-nourished. No distress.  HENT:  Mouth/Throat: Oropharynx is clear and moist. No oropharyngeal exudate.  Neck: Normal range of motion. Neck supple. No hepatojugular reflux and no JVD present.  Cardiovascular: Normal rate, regular rhythm, normal heart sounds and intact distal pulses.   No murmur heard. Pulmonary/Chest: Effort normal and breath sounds normal. No  respiratory distress. He has no wheezes. He has no rales.  Crackles bibasilarly  Musculoskeletal: He exhibits edema (1+ pedal edema to mid calf).  Thickened elongated toenails, some onychomycosis       Assessment & Plan:

## 2013-07-13 NOTE — Patient Instructions (Signed)
Let's change nexium to protonix 40mg  once daily for heartburn medicine. Continue all other medications as up to now. Xray today - I would like to increase lasix to one tablet twice daily for 3 days (over weekend).  If not better with this, notify me for blood work. Return in 1-2 months for follow up visit, sooner if needed. I have placed referral to podiatrist today. Good to see you today ,call us with questions.

## 2013-07-13 NOTE — Assessment & Plan Note (Signed)
Stable, continue med

## 2013-07-13 NOTE — Assessment & Plan Note (Signed)
Increasing fatigue and dyspnea of 2d duration, along with pedal edema and crackles - ?mild acute CHF exac. CXR reviewed today - overall stable compared to prior xray. Will increase lasix to 20mg  bid for 3 days then return to prior dose.  If not improved with this, will recommend he come in for bloodwork

## 2013-07-17 ENCOUNTER — Other Ambulatory Visit: Payer: Self-pay

## 2013-07-17 MED ORDER — BISACODYL 10 MG RE SUPP: 10.0000 mg | RECTAL | Status: DC | PRN

## 2013-07-17 MED ORDER — ONDANSETRON HCL 4 MG PO TABS: 2.0000 mg | ORAL_TABLET | Freq: Four times a day (QID) | ORAL | Status: DC | PRN

## 2013-07-17 MED ORDER — DOCUSATE SODIUM 100 MG PO CAPS: 100.0000 mg | ORAL_CAPSULE | Freq: Every day | ORAL | Status: DC | PRN

## 2013-07-17 NOTE — Telephone Encounter (Signed)
Christopher [name] pts daughter left v/m that pt needs refill on Bisacodyl suppository, docusate sodium 100 mg and ondansetron 4 mg to [facility] [address]. Not sure Dr Reece Agar has ever prescribed; are on pts med list with small quantities order previously. Christopher [name] said if questions to contact pts sone [name] at [phone].Please advise.

## 2013-07-17 NOTE — Telephone Encounter (Signed)
plz notify meds refilled.  Colace stool softener is over the counter.

## 2013-08-06 ENCOUNTER — Ambulatory Visit: Payer: Medicare Other | Admitting: Podiatry

## 2013-08-13 ENCOUNTER — Other Ambulatory Visit: Payer: Self-pay | Admitting: Family Medicine

## 2013-08-20 ENCOUNTER — Ambulatory Visit: Payer: Medicare Other | Admitting: Podiatry

## 2013-08-20 NOTE — Progress Notes (Signed)
Date of Visit 07/04/2013 [facility]  Patient ID: Christopher [name], male   DOB: 02-Apr-1924, 80 y.o.   MRN: [id number]  No Known Allergies  Chief Complaint  Patient presents with  . Discharge Note   History of Present Illness: Patient is an 80 year old Caucasian male who fell on 06/12/2013, sustaining multiple rib fractures particularly on the right-hand side. He was admitted to the [facility] on 06/15/2013, for the purpose of occupational/physical therapy, strengthening, mobility, safety, and adaptation of her ADLs. Initially patient was on 4 L of oxygen, but he is now breathing at room air. His chest is much less painful as per his report. He still requires pain medicine. He is preparing today for discharge.  Review of Systems  Constitutional: Negative for fever and chills.  HENT: Negative for ear discharge, ear pain and nosebleeds.   Eyes: Negative for discharge and redness.  Gastrointestinal: Negative for blood in stool.  Genitourinary: Negative for hematuria and flank pain.  Musculoskeletal: Positive for falls.       Original injury sustained by a fall, while getting out of the shower.  Skin:       Ecchymotic areas, typically over the right chest are fading.  Neurological: Positive for weakness.  Psychiatric/Behavioral: Negative for suicidal ideas and hallucinations.    Past Medical History  Diagnosis Date  . GERD (gastroesophageal reflux disease) 08/1996  . Hyperlipemia 08/1986  . Hypertension Pre 1996  . Osteoporosis 12/2004    dexa T -2.8 (05/2012), T11 compression fx  . Pneumonia 80 yoa  . History of Doppler echocardiogram 05/1995    MVP, MOD AI, MILD TR, MILD MR  . Elevated PSA     decided against further eval  . Diverticulosis   . Bilateral sensorineural hearing loss 02/2012    some conductive left side  . Compression fracture     multiple  . AV block, 2nd degree 04/2012    seen cards, monitor for now, pt does not desire monitor/pacer  . BCC (basal cell  carcinoma of skin) 08/2012    s/p excision Allyson Sabal)  . HOH (hard of hearing)   . Depression   . Chronic kidney disease     elevated PSA-no tx  . CVA (cerebral infarction) 04/20/2012    04/16/12 IMPRESSION:  1. 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 No mass effect or hemorrhage. Basilar artery perforator territory.  2. See MRA findings below.  3. Otherwise stable MRI appearance of the brain since 2011 including extensive perivascular spaces (etat crible appearance).    . Fall at home 06/12/2013    multiple rib fractures; traumatic hemopneumothorax/notes 06/12/2013 (06/14/2013)    Past Surgical History  Procedure Laterality Date  . Cholecystectomy  05/1995  . Exercise treadmill  09/12/2006    NML  . Esophagogastroduodenoscopy  08/18/2004    H. H. Dr. Fuller Plan  . Colonoscopy  05/01/2007    divericulosis, int hemorrhoids - Dr. Fuller Plan  . Dexa  05/2012    T score -2.8 hip, T11 compression fx  . Skin full thickness graft Left 10/19/2012    Procedure: REPAIR OF LEFT EAR WITH TISSUE REARRANGEMENT SKIN GRAFT FULL THICKNESS;  Surgeon: Theodoro Kos, DO;  Location: [facility];  Service: Plastics;  Laterality: Left;  . US echocardiography  05/2013    Mod LVH, EF 60-65%, indeterminate diastolic fxn, aortic sclerosis without stenosis, mildly dilated RV with nl sys fxn, mod dilated arch  . Inguinal hernia repair Left 08/14/2010  Dr. Hassell Done  . Cataract extraction w/ intraocular lens  implant, bilateral Bilateral     Current Outpatient Prescriptions on File Prior to Visit  Medication Sig Dispense Refill  . albuterol (PROVENTIL) (2.5 MG/3ML) 0.083% nebulizer solution Take 2.5 mg by nebulization every 2 (two) hours as needed for wheezing or shortness of breath.      Marland Kitchen albuterol-ipratropium (COMBIVENT) 18-103 MCG/ACT inhaler Inhale 1 puff into the lungs every 6 (six) hours as needed for wheezing or shortness of breath.      Marland Kitchen atorvastatin (LIPITOR) 10 MG tablet Take 10  mg by mouth daily.      . clopidogrel (PLAVIX) 75 MG tablet Take 75 mg by mouth daily.      . furosemide (LASIX) 20 MG tablet Take 20 mg by mouth daily.   30 tablet  6  . hydrochlorothiazide (MICROZIDE) 12.5 MG capsule Take 12.5 mg by mouth daily.      Marland Kitchen HYDROcodone-acetaminophen (NORCO/VICODIN) 5-325 MG per tablet Take one tablet by mouth every 4 hours as needed for pain  180 tablet  0  . lisinopril (PRINIVIL,ZESTRIL) 5 MG tablet Take 5 mg by mouth 2 (two) times daily.      Marland Kitchen loratadine (CLARITIN) 10 MG tablet Take 10 mg by mouth daily.      . potassium chloride SA (K-DUR,KLOR-CON) 20 MEQ tablet Take 20 mEq by mouth daily.       . sertraline (ZOLOFT) 25 MG tablet Take 25 mg by mouth daily.       No current facility-administered medications on file prior to visit.   Recent labs 07/02/2013 WBC 11.8 Debbie BC 4.0 Hemoglobin 12.4 Hematocrit 38.3 MCV 95.8 MCH 31 MCHC 32.4 RDW 13.7 Platelets 372 Lymphocytes are low at 13.4% but normal in number. Neutrophils 71.7% neutrophil #8.4  06/21/2013 WBC 13.5 RBC 4.1 Hemoglobin 12.6 Hematocrit 40.1 MCV 97.8 MCH 30.7 MCHC 31.4 RDW 13.5 Platelets 287  Sodium 139 Potassium 3.9 Chloride 102 CO2 30 AGAP 7 Glucose 113 BUN 32 Creatinine 1.1 Calcium 9.2 Total protein 6.7 Albumin 3.4 GLOB 3.3 Total bilirubin 0.8 ALP 51 AST 20 ALT 17   Vital signs: Blood pressure 136/82 Pulse 84 Temp 97.8 Respiratory rate 20 Pulse oximetry 90% on room air.  Physical Examination   Patient is alert and verbally appropriate, in no acute distress. He is oriented x3 and verbally appropriate  Head is normal cephalic  Pupils equally round and reactive to light, extraocular movements intact, positive red reflex, pink conjunctiva  TMs unremarkable  Oral pharynx essentially unremarkable  No cervical adenopathy palpable, no thyromegaly.  Apical pulse with a 2/6 murmur, best appreciated at the left sternal border second intercostal  space  Bilateral breath sounds are essentially clear, there is some mild pursed lip breathing, oximetry at room air is 90%.  Ecchymotic areas over the right chest and abdomen are significantly decreased though still present. The area does remain tender to palpation.  Abdomen soft, positive bowel sounds, distended.  Positive pedal pulses, really no lower extremity edema.  Assessment/plan  Ready for discharge, will need ongoing physical/occupational therapy to assess for safety in the home, strengthening, mobility, and adaptation for ADLs  Pain Vicodin, 5/325, one tablet by mouth every 4 hours as needed for pain, will be necessary to monitor for oversedation  Hypertension lisinopril 5 mg by mouth twice a day. Hydrochlorothiazide 12.5 mg each day. Also, Lasix 20 mg per day. He is to continue on potassium 20 mEq per day. He will need ongoing monitoring by his  primary care provider to assess edema, and electrolyte/renal function.  Status post CVA. Patient will continue on Plavix 75 mg per day.  Depression, will continue on Zoloft 25 mg per day  COPD, albuterol 2.5 mg per 3 mL 0.83% nebulizer solution may use every 2 hours as needed for wheezing or shortness of breath  May also use Combivent 1 puff every 6 hours as needed for wheezing or shortness of breath  Lipid metabolism disorder continue Lipitor 10 mg each evening. Status and liver function will need to be monitored by his primary care provider  Allergic rhinitis, will continue with Claritin 10 mg per day.  Patient will be monitored by his primary care provider after discharge.

## 2013-08-22 NOTE — Progress Notes (Signed)
This encounter was created in error - please disregard.

## 2013-08-29 ENCOUNTER — Encounter: Payer: Self-pay | Admitting: Podiatry

## 2013-08-29 ENCOUNTER — Ambulatory Visit (INDEPENDENT_AMBULATORY_CARE_PROVIDER_SITE_OTHER): Payer: Medicare Other | Admitting: Podiatry

## 2013-08-29 VITALS — BP 127/76 | HR 86 | Resp 12 | Ht 71.0 in | Wt 165.0 lb

## 2013-08-29 DIAGNOSIS — B351 Tinea unguium: Secondary | ICD-10-CM

## 2013-08-29 DIAGNOSIS — M79609 Pain in unspecified limb: Secondary | ICD-10-CM

## 2013-08-29 NOTE — Progress Notes (Signed)
   Subjective:    Patient ID: DIALLO PONDER, male    DOB: 04/01/1924, 80 y.o.   MRN: [id number]  HPI Comments: '' TOENAILS TRIM''  Patient presents with his daughter and there  is no recent history of podiatric care.   Review of Systems  Constitutional: Positive for activity change, appetite change and fatigue.  Gastrointestinal: Positive for nausea.  Genitourinary: Positive for urgency and frequency.  Musculoskeletal: Positive for back pain, gait problem and myalgias.  Neurological: Positive for dizziness, weakness and light-headedness.  All other systems reviewed and are negative.       Objective:   Physical Exam  Orientated x3 white male  Vascular: The DP and PT pulses are 2/4 bilaterally  Neurological: Knee and ankle reflexes are reactive bilaterally  Dermatological: Atrophic skin without any hair growth noted. Hypertrophic, elongated, brittle toenails x10 noted.  Musculoskeletal: Bilateral HAV deformities noted. Patient walks with cane with an unsteady gait pattern.        Assessment & Plan:   Assessment: Symptomatic onychomycoses x10 Gait disturbance  Plan: Nails x10 are debrided back without a bleeding. Reappoint at three-month intervals.

## 2013-09-05 ENCOUNTER — Ambulatory Visit (INDEPENDENT_AMBULATORY_CARE_PROVIDER_SITE_OTHER): Payer: Medicare Other | Admitting: Family Medicine

## 2013-09-05 ENCOUNTER — Encounter: Payer: Self-pay | Admitting: Family Medicine

## 2013-09-05 ENCOUNTER — Encounter: Payer: Self-pay | Admitting: Gastroenterology

## 2013-09-05 VITALS — BP 130/80 | HR 76 | Temp 98.2°F | Wt 157.2 lb

## 2013-09-05 DIAGNOSIS — R1310 Dysphagia, unspecified: Secondary | ICD-10-CM

## 2013-09-05 DIAGNOSIS — B370 Candidal stomatitis: Secondary | ICD-10-CM

## 2013-09-05 DIAGNOSIS — R0989 Other specified symptoms and signs involving the circulatory and respiratory systems: Secondary | ICD-10-CM

## 2013-09-05 DIAGNOSIS — R634 Abnormal weight loss: Secondary | ICD-10-CM

## 2013-09-05 DIAGNOSIS — R0609 Other forms of dyspnea: Secondary | ICD-10-CM

## 2013-09-05 DIAGNOSIS — R0600 Dyspnea, unspecified: Secondary | ICD-10-CM

## 2013-09-05 DIAGNOSIS — R630 Anorexia: Secondary | ICD-10-CM

## 2013-09-05 LAB — CBC WITH DIFFERENTIAL/PLATELET
BASOS PCT: 0.5 % (ref 0.0–3.0)
Basophils Absolute: 0.1 10*3/uL (ref 0.0–0.1)
EOS ABS: 0.2 10*3/uL (ref 0.0–0.7)
Eosinophils Relative: 1.5 % (ref 0.0–5.0)
HCT: 43.8 % (ref 39.0–52.0)
HEMOGLOBIN: 14.4 g/dL (ref 13.0–17.0)
LYMPHS ABS: 2 10*3/uL (ref 0.7–4.0)
Lymphocytes Relative: 15.4 % (ref 12.0–46.0)
MCHC: 32.8 g/dL (ref 30.0–36.0)
MCV: 96.4 fl (ref 78.0–100.0)
MONO ABS: 1 10*3/uL (ref 0.1–1.0)
Monocytes Relative: 7.5 % (ref 3.0–12.0)
Neutro Abs: 9.9 10*3/uL — ABNORMAL HIGH (ref 1.4–7.7)
Neutrophils Relative %: 75.1 % (ref 43.0–77.0)
PLATELETS: 264 10*3/uL (ref 150.0–400.0)
RBC: 4.55 Mil/uL (ref 4.22–5.81)
RDW: 14 % (ref 11.5–14.6)
WBC: 13.2 10*3/uL — ABNORMAL HIGH (ref 4.5–10.5)

## 2013-09-05 LAB — COMPREHENSIVE METABOLIC PANEL
ALT: 23 U/L (ref 0–53)
AST: 16 U/L (ref 0–37)
Albumin: 3.2 g/dL — ABNORMAL LOW (ref 3.5–5.2)
Alkaline Phosphatase: 57 U/L (ref 39–117)
BILIRUBIN TOTAL: 0.6 mg/dL (ref 0.3–1.2)
BUN: 17 mg/dL (ref 6–23)
CALCIUM: 9.3 mg/dL (ref 8.4–10.5)
CO2: 28 mEq/L (ref 19–32)
Chloride: 101 mEq/L (ref 96–112)
Creatinine, Ser: 1.1 mg/dL (ref 0.4–1.5)
GFR: 70.18 mL/min (ref >60.00)
GLUCOSE: 141 mg/dL — AB (ref 70–99)
Potassium: 3.6 mEq/L (ref 3.5–5.1)
Sodium: 136 mEq/L (ref 135–145)
TOTAL PROTEIN: 7.7 g/dL (ref 6.0–8.3)

## 2013-09-05 LAB — TSH: TSH: 0.32 u[IU]/mL — AB (ref 0.35–5.50)

## 2013-09-05 MED ORDER — NYSTATIN 100000 UNIT/ML MT SUSP: 5.0000 mL | Freq: Four times a day (QID) | OROMUCOSAL | Status: DC

## 2013-09-05 MED ORDER — ALBUTEROL SULFATE HFA 108 (90 BASE) MCG/ACT IN AERS: 2.0000 | INHALATION_SPRAY | Freq: Four times a day (QID) | RESPIRATORY_TRACT | Status: DC | PRN

## 2013-09-05 NOTE — Assessment & Plan Note (Signed)
Check CBC, CMP, TSH today.

## 2013-09-05 NOTE — Progress Notes (Signed)
Subjective:    Patient ID: Christopher [name], male    DOB: 03/09/1924, 80 y.o.   MRN: [id number]  HPI CC: weakness, no appetite  Very pleasant 80 yo with h/o GERD with large HH and intrathoracic stomach by latest CXR, HTN, osteoporosis with multiple compression fractures, 2nd degree AV block, CKD, and CVA 04/2012, as well as h/o PNA.  3d h/o feeling ill with gen malaise, fatigue and weakness, no appetite but stays hungry.  Stays cold. For last 1 month having bad taste to food, progressively more trouble swallowing.  Only able to tolerate oatmeal.  Otherwise foods get stuck, even grits make him gag, needs to drink water to help swallow.  Has stopped eating all his favorite foods 2/2 this.  Notices trouble with initial swallowing and a few seconds after this.  Endorses fullness in chest that he can't cough up.  Dry cough.  Stays dyspneic.  Denies fevers/chills, congestion, chest or abdominal pain, palpitations. Denies significant GERD sxs, water brash, or acid reflux.  No nausea/vomiting, diarrhea.    Recent hospitalization 06/2013 for fall at home in shower with multiple R sided rib fractures as well as R hemopneumothorax. Stabilized in hospital and discharged to [facility]. Returned home 07/08/2013.  Since then, progressively more weak and tired.  11lb weight loss noted in the last 2 months, 8lb in last week. Wt Readings from Last 3 Encounters:  09/05/13 157 lb 4 oz (71.328 kg)  08/29/13 165 lb (74.844 kg)  07/13/13 168 lb (76.204 kg)   Last echo 05/2013: Mod LVH, EF 60-65%, indeterminate diastolic fxn, aortic sclerosis without stenosis, mildly dilated RV with nl sys fxn, mod dilated aortic arch.  Never smoker, no known h/o COPD although he was discharged from SNF with combivent and albuterol inhaler.  Past Medical History  Diagnosis Date  . GERD (gastroesophageal reflux disease) 08/1996    with large HH and partially intrathoracic stomach  . Hyperlipemia 08/1986  . Hypertension Pre  1996  . Osteoporosis 2006    dexa T -2.8 (05/2012), T11 compression fx  . Pneumonia 80 yoa  . History of Doppler echocardiogram 05/1995    MVP, MOD AI, MILD TR, MILD MR  . Elevated PSA     decided against further eval  . Diverticulosis   . Bilateral sensorineural hearing loss 02/2012    some conductive left side  . Compression fracture     multiple  . AV block, 2nd degree 04/2012    seen cards, monitor for now, pt does not desire monitor/pacer  . BCC (basal cell carcinoma of skin) 08/2012    s/p excision Allyson Sabal)  . HOH (hard of hearing)   . Depression   . Chronic kidney disease     elevated PSA-no tx  . CVA (cerebral infarction) 04/20/2012    04/16/12 IMPRESSION:  1. 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 No mass effect or hemorrhage. Basilar artery perforator territory.  2. See MRA findings below.  3. Otherwise stable MRI appearance of the brain since 2011 including extensive perivascular spaces (etat crible appearance).    . Fall at home 06/12/2013    multiple rib fractures; traumatic hemopneumothorax/notes 06/12/2013 (06/14/2013)    Review of Systems Per HPI    Objective:   Physical Exam  Nursing note and vitals reviewed. Constitutional: He appears well-developed and well-nourished. No distress.  HENT:  Mouth/Throat: Uvula is midline, oropharynx is clear and moist and mucous membranes are normal. No oropharyngeal exudate, posterior oropharyngeal edema,  posterior oropharyngeal erythema or tonsillar abscesses.  Erythematous tongue, some white patches on tongue.  Neck: Normal range of motion. Neck supple. No thyromegaly present.  Cardiovascular: Normal rate, regular rhythm, normal heart sounds and intact distal pulses.   No murmur heard. Pulmonary/Chest: Effort normal and breath sounds normal. No respiratory distress. He has no wheezes. He has no rales.  Abdominal: Soft. Normal appearance and bowel sounds are normal. He exhibits no distension and no mass.  There is no hepatosplenomegaly. There is no tenderness. There is no rigidity, no rebound, no guarding, no CVA tenderness and negative Murphy's sign.  Musculoskeletal: Normal range of motion. He exhibits no edema.  Lymphadenopathy:    He has no cervical adenopathy.  Skin: Skin is warm and dry.  Psychiatric: He has a normal mood and affect.       Assessment & Plan:

## 2013-09-05 NOTE — Assessment & Plan Note (Signed)
Possibly thrush related.  But noted weight loss and lack of appetite.  Will refer to GI for further evaluation.

## 2013-09-05 NOTE — Assessment & Plan Note (Signed)
Initially I recommended pt continue combivent inhaler as I believed he had dx COPD, but reviewing chart there is no indication of this (except for one note from SNF NP) and no smoking history. Will recommend albuterol prn dyspnea (not combivent).   I spoke with son and discussed this after pt had left.

## 2013-09-05 NOTE — Progress Notes (Signed)
Pre-visit discussion using our clinic review tool. No additional management support is needed unless otherwise documented below in the visit note.  

## 2013-09-05 NOTE — Assessment & Plan Note (Signed)
Evidence of thrush on exam - could be contributing to bad taste, ?esophageal thrush given dysphagia endorsed. Treat for now with nystatin swish/swallow.

## 2013-09-05 NOTE — Patient Instructions (Addendum)
Blood work today - we will call you with results. Pass by Marion's office for referral to stomach doctors. Use combivent respimat when you have shortness of breath ==> changed rec use albuterol, not combivent inhaler.

## 2013-09-06 ENCOUNTER — Other Ambulatory Visit: Payer: Self-pay | Admitting: Family Medicine

## 2013-09-06 DIAGNOSIS — D72829 Elevated white blood cell count, unspecified: Secondary | ICD-10-CM

## 2013-09-06 DIAGNOSIS — R946 Abnormal results of thyroid function studies: Secondary | ICD-10-CM

## 2013-09-10 ENCOUNTER — Other Ambulatory Visit: Payer: Self-pay | Admitting: Family Medicine

## 2013-09-12 ENCOUNTER — Other Ambulatory Visit (INDEPENDENT_AMBULATORY_CARE_PROVIDER_SITE_OTHER): Payer: Medicare Other

## 2013-09-12 DIAGNOSIS — R946 Abnormal results of thyroid function studies: Secondary | ICD-10-CM

## 2013-09-12 DIAGNOSIS — D72829 Elevated white blood cell count, unspecified: Secondary | ICD-10-CM

## 2013-09-12 LAB — CBC WITH DIFFERENTIAL/PLATELET
BASOS PCT: 0.4 % (ref 0.0–3.0)
Basophils Absolute: 0 10*3/uL (ref 0.0–0.1)
Eosinophils Absolute: 0.3 10*3/uL (ref 0.0–0.7)
Eosinophils Relative: 2.3 % (ref 0.0–5.0)
HCT: 43 % (ref 39.0–52.0)
Hemoglobin: 14.1 g/dL (ref 13.0–17.0)
LYMPHS PCT: 15.9 % (ref 12.0–46.0)
Lymphs Abs: 1.9 10*3/uL (ref 0.7–4.0)
MCHC: 32.8 g/dL (ref 30.0–36.0)
MCV: 95.2 fl (ref 78.0–100.0)
MONO ABS: 0.7 10*3/uL (ref 0.1–1.0)
Monocytes Relative: 6 % (ref 3.0–12.0)
NEUTROS ABS: 8.9 10*3/uL — AB (ref 1.4–7.7)
NEUTROS PCT: 75.4 % (ref 43.0–77.0)
Platelets: 297 10*3/uL (ref 150.0–400.0)
RBC: 4.51 Mil/uL (ref 4.22–5.81)
RDW: 13.6 % (ref 11.5–14.6)
WBC: 11.9 10*3/uL — ABNORMAL HIGH (ref 4.5–10.5)

## 2013-09-13 ENCOUNTER — Ambulatory Visit: Payer: Medicare Other | Admitting: Family Medicine

## 2013-09-13 ENCOUNTER — Telehealth: Payer: Self-pay | Admitting: Family Medicine

## 2013-09-13 LAB — T3: T3, Total: 103.3 ng/dL (ref 80.0–204.0)

## 2013-09-13 LAB — T4, FREE: FREE T4: 0.86 ng/dL (ref 0.60–1.60)

## 2013-09-13 LAB — TSH: TSH: 0.69 u[IU]/mL (ref 0.35–5.50)

## 2013-09-13 NOTE — Telephone Encounter (Signed)
Opened in error

## 2013-09-27 ENCOUNTER — Other Ambulatory Visit: Payer: Self-pay

## 2013-09-27 ENCOUNTER — Ambulatory Visit: Payer: Medicare Other | Admitting: Nurse Practitioner

## 2013-09-27 MED ORDER — LISINOPRIL 10 MG PO TABS: 10.0000 mg | ORAL_TABLET | Freq: Every day | ORAL | Status: DC

## 2013-09-27 NOTE — Telephone Encounter (Signed)
[name] left v/m about refilling pts BP med; spoke with Lanelle Bal at [facility] and pt needs refill on Lisinopril. Advised [name] done.

## 2013-09-28 ENCOUNTER — Ambulatory Visit: Payer: Medicare Other | Admitting: Gastroenterology

## 2013-10-10 ENCOUNTER — Other Ambulatory Visit: Payer: Self-pay | Admitting: Family Medicine

## 2013-10-24 ENCOUNTER — Ambulatory Visit (INDEPENDENT_AMBULATORY_CARE_PROVIDER_SITE_OTHER): Payer: Medicare Other | Admitting: Gastroenterology

## 2013-10-24 ENCOUNTER — Encounter: Payer: Self-pay | Admitting: Gastroenterology

## 2013-10-24 VITALS — BP 118/80 | HR 102 | Ht 71.0 in | Wt 166.4 lb

## 2013-10-24 DIAGNOSIS — R1319 Other dysphagia: Secondary | ICD-10-CM

## 2013-10-24 DIAGNOSIS — K219 Gastro-esophageal reflux disease without esophagitis: Secondary | ICD-10-CM

## 2013-10-24 DIAGNOSIS — K449 Diaphragmatic hernia without obstruction or gangrene: Secondary | ICD-10-CM

## 2013-10-24 DIAGNOSIS — Z8673 Personal history of transient ischemic attack (TIA), and cerebral infarction without residual deficits: Secondary | ICD-10-CM

## 2013-10-24 NOTE — Progress Notes (Signed)
    History of Present Illness: This is an 80 year old male here today with his daughter. He has a history of GERD and large hiatal hernia that I evaluated in 2006 with upper endoscopy and an upper GI series. He has been managed with Nexium for treatment of GERD and his symptoms are under good control.   He is referred now for evaluation of a history of dysphasia that began following a brain stem stroke in 2013. He relates difficulty moving foods to the back of his throat and frequent coughing and choking when swallowing liquids and pills. He localizes his symptoms to his mouth and his mid neck. He had an MBSS in 2013 following his stroke with significant oropharyngeal deficits. The findings from a repeat MBSS in 08/2012 are below:  Dysphagia Diagnosis: Within Functional Limits  Clinical impression: Pt presents with much improved swallow function since his D/C from [facility] September of 2013. Oral mastication is normal; there is a mild swallow delay to level of valleculae, not considered disordered in the elderly. When consuming a 13 mm barium pill in conjunction with thin liquids, the liquids were immediately aspirated. Also notable was a CP bar, which does not impede function at this time, as well as a probable osteophyte at C5-6 (not confirmed by radiologist). While neither structural deficit interferes with function, both were brought to the attention of pt/daughter. Recommend continuing regular foods with thin liquids; meds whole with puree to prevent aspiration. Pt agrees with results/recs. No f/u recommended.  Review of Systems: Pertinent positive and negative review of systems were noted in the above HPI section. All other review of systems were otherwise negative.  Current Medications, Allergies, Past Medical History, Past Surgical History, Family History and Social History were reviewed in [employer] record.  Physical Exam: General: Well developed , well nourished, no acute  distress Head: Normocephalic and atraumatic Eyes:  sclerae anicteric, EOMI Ears: Normal auditory acuity Mouth: No deformity or lesions Neck: Supple, no masses or thyromegaly Lungs: Clear throughout to auscultation Heart: Regular rate and rhythm; no murmurs, rubs or bruits Abdomen: Soft, non tender and non distended. No masses, hepatosplenomegaly or hernias noted. Normal Bowel sounds Musculoskeletal: Symmetrical with no gross deformities  Skin: No lesions on visible extremities Pulses:  Normal pulses noted Extremities: No clubbing, cyanosis, edema or deformities noted Neurological: Alert oriented x 4, grossly nonfocal Cervical Nodes:  No significant cervical adenopathy Inguinal Nodes: No significant inguinal adenopathy Psychological:  Alert and cooperative. Normal mood and affect  Assessment and Recommendations:  1. 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 No clear evidence of esophageal dysphasia. Deficits in oropharyngeal function were improved but persistent on MBSS in early 2014 and this appears to be the cause of his ongoing swallowing problems. Repeat MBSS and schedule a barium esophagram. If studies show this to be persistent oropharyngeal dysfunction he should follow up with his neurologist, speech pathologist and Dr. Danise Mina for management of this problem.   2. GERD with a large hiatal hernia. Standard antireflux measures and Nexium 40 mg daily. Ongoing followup with Dr. Danise Mina.

## 2013-10-24 NOTE — Patient Instructions (Signed)
You have been scheduled for a Barium Esophogram/ Modified Barium Swallow at [facility] (1st floor of the hospital) on 10/30/13 at 11:00am. Please arrive 15 minutes prior to your appointment for registration. Make certain not to have anything to eat or drink 6 hours prior to your test. If you need to reschedule for any reason, please contact radiology at [phone] to do so. __________________________________________________________________ A barium swallow is an examination that concentrates on views of the esophagus. This tends to be a double contrast exam (barium and two liquids which, when combined, create a gas to distend the wall of the oesophagus) or single contrast (non-ionic iodine based). The study is usually tailored to your symptoms so a good history is essential. Attention is paid during the study to the form, structure and configuration of the esophagus, looking for functional disorders (such as aspiration, dysphagia, achalasia, motility and reflux) EXAMINATION You may be asked to change into a gown, depending on the type of swallow being performed. A radiologist and radiographer will perform the procedure. The radiologist will advise you of the type of contrast selected for your procedure and direct you during the exam. You will be asked to stand, sit or lie in several different positions and to hold a small amount of fluid in your mouth before being asked to swallow while the imaging is performed .In some instances you may be asked to swallow barium coated marshmallows to assess the motility of a solid food bolus. The exam can be recorded as a digital or video fluoroscopy procedure. POST PROCEDURE It will take 1-2 days for the barium to pass through your system. To facilitate this, it is important, unless otherwise directed, to increase your fluids for the next 24-48hrs and to resume your normal diet.  This test typically takes about 30 minutes to  perform. __________________________________________________________________________________

## 2013-10-25 ENCOUNTER — Other Ambulatory Visit (HOSPITAL_COMMUNITY): Payer: Self-pay | Admitting: Gastroenterology

## 2013-10-25 DIAGNOSIS — R1310 Dysphagia, unspecified: Secondary | ICD-10-CM

## 2013-10-29 ENCOUNTER — Ambulatory Visit (HOSPITAL_COMMUNITY): Payer: Medicare Other

## 2013-10-30 ENCOUNTER — Ambulatory Visit (HOSPITAL_COMMUNITY)
Admission: RE | Admit: 2013-10-30 | Discharge: 2013-10-30 | Disposition: A | Discharge status: Home / Self Care | Payer: Medicare Other | Source: Ambulatory Visit | Attending: Gastroenterology | Admitting: Gastroenterology

## 2013-10-30 DIAGNOSIS — R1310 Dysphagia, unspecified: Secondary | ICD-10-CM | POA: Insufficient documentation

## 2013-10-30 DIAGNOSIS — R1319 Other dysphagia: Secondary | ICD-10-CM

## 2013-10-30 DIAGNOSIS — K449 Diaphragmatic hernia without obstruction or gangrene: Secondary | ICD-10-CM | POA: Insufficient documentation

## 2013-10-30 NOTE — Procedures (Signed)
Objective Swallowing Evaluation: Modified Barium Swallowing Study  Patient Details  Name: Christopher [name] MRN: [id number] Date of Birth: [birth year]  Today's Date: 10/30/2013 Time: 1125-1150 SLP Time Calculation (min): 25 min  Past Medical History:  Past Medical History  Diagnosis Date  . GERD (gastroesophageal reflux disease) 08/1996    with large HH and partially intrathoracic stomach  . Hyperlipemia 08/1986  . Hypertension Pre 1996  . Osteoporosis 2006    dexa T -2.8 (05/2012), T11 compression fx  . Pneumonia 80 yoa  . History of Doppler echocardiogram 05/1995    MVP, MOD AI, MILD TR, MILD MR  . Elevated PSA     decided against further eval  . Diverticulosis   . Bilateral sensorineural hearing loss 02/2012    some conductive left side  . Compression fracture     multiple  . AV block, 2nd degree 04/2012    seen cards, monitor for now, pt does not desire monitor/pacer  . BCC (basal cell carcinoma of skin) 08/2012    s/p excision Allyson Sabal)  . HOH (hard of hearing)   . Depression   . Chronic kidney disease     elevated PSA-no tx  . CVA (cerebral infarction) 04/20/2012    04/16/12 IMPRESSION:  1. 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 No mass effect or hemorrhage. Basilar artery perforator territory.  2. See MRA findings below.  3. Otherwise stable MRI appearance of the brain since 2011 including extensive perivascular spaces (etat crible appearance).    . Fall at home 06/12/2013    multiple rib fractures; traumatic hemopneumothorax/notes 06/12/2013 (06/14/2013)  . Hiatal hernia    Past Surgical History:  Past Surgical History  Procedure Laterality Date  . Cholecystectomy  05/1995  . Exercise treadmill  09/12/2006    NML  . Esophagogastroduodenoscopy  08/18/2004    H. H. Dr. Fuller Plan  . Colonoscopy  05/01/2007    divericulosis, int hemorrhoids - Dr. Fuller Plan  . Dexa  05/2012    T score -2.8 hip, T11 compression fx  . Skin full thickness graft Left 10/19/2012     Procedure: REPAIR OF LEFT EAR WITH TISSUE REARRANGEMENT SKIN GRAFT FULL THICKNESS;  Surgeon: Theodoro Kos, DO;  Location: [facility];  Service: Plastics;  Laterality: Left;  . US echocardiography  05/2013    Mod LVH, EF 60-65%, indeterminate diastolic fxn, aortic sclerosis without stenosis, mildly dilated RV with nl sys fxn, mod dilated arch  . Inguinal hernia repair Left 08/14/2010    Dr. Hassell Done  . Cataract extraction w/ intraocular lens  implant, bilateral Bilateral    HPI:  80 yr old seen for outpatient MBS accompanied by his daughter.  PMH:  CVA 04/2012, GERD, hiatal hernia with surgical intervention, pna, HTN.  MBS days after CVA recommended nectar thick liquids.  MBS 08/2012 recommended regular texture, thin liquids.  Pt. reports coughing during and after meals, pharyngeal globus sensation, difficutly swallowing pills.  Noted on previous MBS was cricopharyngeal bar and possible cervical osteophyte C 5-6 (not affecting swallow function).  Pt. is having barium esophagram following MBS.  He denies recent pna or respiratory difficulties.     Assessment / Plan / Recommendation Clinical Impression  Dysphagia Diagnosis: Mild pharyngeal phase dysphagia;Mild cervical esophageal phase dysphagia (min-mild pharyngeal) Clinical impression: Pt. exhibited min-mild pharyngeal marked by decreased sensation leading to delayed swallow initiation to pyriform sinus level.  He demonstrated adequate tracheal protection with full epigottic inversion without penetration, aspiration or pharyngeal residue evident.  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  Pt. was advised to continue regular texture diet using caution with dry, crumbly foods; moist meats and swallow twice intermittently to clear residue at CP muscle.  Continue thin liquids via small sips, pills whole in applesauce and esophageal precautions.         Treatment Recommendation  No treatment  recommended at this time    Diet Recommendation Regular;Thin liquid   Liquid Administration via: Cup Medication Administration: Whole meds with puree (if pill small, ok with liquid) Supervision: Patient able to self feed Compensations: Slow rate;Small sips/bites;Multiple dry swallows after each bite/sip;Follow solids with liquid Postural Changes and/or Swallow Maneuvers: Seated upright 90 degrees;Upright 30-60 min after meal    Other  Recommendations Oral Care Recommendations: Oral care BID   Follow Up Recommendations  None    Frequency and Duration        Pertinent Vitals/Pain WDL            Reason for Referral Objectively evaluate swallowing function   Oral Phase Oral Preparation/Oral Phase Oral Phase: WFL   Pharyngeal Phase Pharyngeal Phase Pharyngeal Phase: Impaired Pharyngeal - Thin Pharyngeal - Thin Teaspoon: Delayed swallow initiation;Premature spillage to valleculae Pharyngeal - Thin Cup: Delayed swallow initiation;Premature spillage to pyriform sinuses Pharyngeal - Solids Pharyngeal - Regular: Within functional limits Pharyngeal - Pill: Within functional limits  Cervical Esophageal Phase    GO    Cervical Esophageal Phase Cervical Esophageal Phase: Impaired Cervical Esophageal Phase - Comment Cervical Esophageal Comment:  (residue and mild backflow)    Functional Assessment Tool Used: clinical judgement Functional Limitations: Swallowing Swallow Current Status (A0762): At least 1 percent but less than 20 percent impaired, limited or restricted Swallow Goal Status [phone]): At least 1 percent but less than 20 percent impaired, limited or restricted Swallow Discharge Status [phone]): At least 1 percent but less than 20 percent impaired, limited or restricted    Houston Siren M.Ed [employer] [phone]  10/30/2013

## 2013-11-02 ENCOUNTER — Other Ambulatory Visit: Payer: Self-pay | Admitting: Family Medicine

## 2013-11-25 ENCOUNTER — Other Ambulatory Visit: Payer: Self-pay | Admitting: Family Medicine

## 2013-11-26 ENCOUNTER — Ambulatory Visit: Payer: Medicare Other | Admitting: Podiatry

## 2013-12-08 ENCOUNTER — Other Ambulatory Visit: Payer: Self-pay | Admitting: Family Medicine

## 2013-12-12 ENCOUNTER — Ambulatory Visit: Payer: Medicare Other | Admitting: Podiatry

## 2013-12-17 ENCOUNTER — Ambulatory Visit (INDEPENDENT_AMBULATORY_CARE_PROVIDER_SITE_OTHER): Payer: Medicare Other | Admitting: Podiatry

## 2013-12-17 ENCOUNTER — Encounter: Payer: Self-pay | Admitting: Podiatry

## 2013-12-17 VITALS — Resp 15 | Ht 71.0 in | Wt 165.0 lb

## 2013-12-17 DIAGNOSIS — B351 Tinea unguium: Secondary | ICD-10-CM

## 2013-12-17 DIAGNOSIS — M79609 Pain in unspecified limb: Secondary | ICD-10-CM

## 2013-12-18 NOTE — Progress Notes (Signed)
Patient ID: Christopher [name], male   DOB: [birth year], 80 y.o.   MRN: [id number]  Subjective: Orientated x3 space male presents today complaining of painful toenails  Objective: Elongated, hypertrophic, discolored toenails x10  Assessment: Symptomatic onychomycoses x10  Plan: Debrided toenails x10 without a bleeding  Reappoint x3 months

## 2013-12-19 ENCOUNTER — Telehealth: Payer: Self-pay

## 2013-12-19 NOTE — Telephone Encounter (Signed)
Rx for Protonix has been approved from 09/11/13-12/10/14

## 2013-12-26 ENCOUNTER — Ambulatory Visit (INDEPENDENT_AMBULATORY_CARE_PROVIDER_SITE_OTHER): Payer: Medicare Other | Admitting: Internal Medicine

## 2013-12-26 ENCOUNTER — Encounter: Payer: Self-pay | Admitting: Internal Medicine

## 2013-12-26 VITALS — BP 130/90 | HR 93 | Ht 71.0 in | Wt 164.0 lb

## 2013-12-26 DIAGNOSIS — I441 Atrioventricular block, second degree: Secondary | ICD-10-CM

## 2013-12-26 DIAGNOSIS — I10 Essential (primary) hypertension: Secondary | ICD-10-CM

## 2013-12-26 DIAGNOSIS — K648 Other hemorrhoids: Secondary | ICD-10-CM

## 2013-12-26 NOTE — Progress Notes (Signed)
PCP:  Ria Bush, MD  The patient presents today for routine electrophysiology followup.  He has done well since his last visit to our office.  He has had no symptoms of AV block.  He denies palpitations.  He had a mechanical fall and developed rib fracture with pneumothorax since I saw him last.  He has occasional SOB and DOE.  Today, he denies symptoms of chest pain,   orthopnea, PND, lower extremity edema,  presyncope, syncope, or new neurologic sequela.  He has occasional postural dizziness. The patient feels that he is tolerating medications without difficulties and is otherwise without complaint today.   Past Medical History  Diagnosis Date  . GERD (gastroesophageal reflux disease) 08/1996    with large HH and partially intrathoracic stomach  . Hyperlipemia 08/1986  . Hypertension Pre 1996  . Osteoporosis 2006    dexa T -2.8 (05/2012), T11 compression fx  . Pneumonia 71 yoa  . History of Doppler echocardiogram 05/1995    MVP, MOD AI, MILD TR, MILD MR  . Elevated PSA     decided against further eval  . Diverticulosis   . Bilateral sensorineural hearing loss 02/2012    some conductive left side  . Compression fracture     multiple  . AV block, 2nd degree 04/2012    seen cards, monitor for now, pt does not desire monitor/pacer  . BCC (basal cell carcinoma of skin) 08/2012    s/p excision Allyson Sabal)  . HOH (hard of hearing)   . Depression   . Chronic kidney disease     elevated PSA-no tx  . CVA (cerebral infarction) 04/20/2012    04/16/12 IMPRESSION:  1. 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 No mass effect or hemorrhage. Basilar artery perforator territory.  2. See MRA findings below.  3. Otherwise stable MRI appearance of the brain since 2011 including extensive perivascular spaces (etat crible appearance).    . Fall at home 06/12/2013    multiple rib fractures; traumatic hemopneumothorax/notes 06/12/2013 (06/14/2013)  . Hiatal hernia    Past Surgical  History  Procedure Laterality Date  . Cholecystectomy  05/1995  . Exercise treadmill  09/12/2006    NML  . Esophagogastroduodenoscopy  08/18/2004    H. H. Dr. Fuller Plan  . Colonoscopy  05/01/2007    divericulosis, int hemorrhoids - Dr. Fuller Plan  . Dexa  05/2012    T score -2.8 hip, T11 compression fx  . Skin full thickness graft Left 10/19/2012    Procedure: REPAIR OF LEFT EAR WITH TISSUE REARRANGEMENT SKIN GRAFT FULL THICKNESS;  Surgeon: Theodoro Kos, DO;  Location: [facility];  Service: Plastics;  Laterality: Left;  . US echocardiography  05/2013    Mod LVH, EF 60-65%, indeterminate diastolic fxn, aortic sclerosis without stenosis, mildly dilated RV with nl sys fxn, mod dilated arch  . Inguinal hernia repair Left 08/14/2010    Dr. Hassell Done  . Cataract extraction w/ intraocular lens  implant, bilateral Bilateral     Current Outpatient Prescriptions  Medication Sig Dispense Refill  . atorvastatin (LIPITOR) 10 MG tablet Take 10 mg by mouth daily.      . clopidogrel (PLAVIX) 75 MG tablet TAKE 1 TABLET EVERY DAY  30 tablet  3  . docusate sodium (COLACE) 100 MG capsule Take 1 capsule (100 mg total) by mouth daily as needed for mild constipation.  90 capsule  3  . furosemide (LASIX) 20 MG tablet Take 20 mg by mouth daily.   [place]  tablet  6  . hydrochlorothiazide (MICROZIDE) 12.5 MG capsule Take 12.5 mg by mouth daily.      Marland Kitchen KLOR-CON M20 20 MEQ tablet TAKE 1 TABLET BY MOUTH EVERY DAY  30 tablet  3  . lisinopril (PRINIVIL,ZESTRIL) 10 MG tablet Take 1 tablet (10 mg total) by mouth daily.  30 tablet  5  . loratadine (CLARITIN) 10 MG tablet TAKE 1 TABLET BY MOUTH EVERY DAY  30 tablet  6  . Melatonin 10 MG CAPS Take 1 capsule by mouth at bedtime as needed.      . Multiple Vitamin (MULTIVITAMIN) tablet Take 1 tablet by mouth daily.      . Omega-3 Fatty Acids (FISH OIL) 1000 MG CAPS Take 1,000 mg by mouth daily.      . pantoprazole (PROTONIX) 40 MG tablet TAKE 1 TABLET EVERY DAY  30 tablet  9   . sertraline (ZOLOFT) 25 MG tablet TAKE 1 TABLET (25 MG TOTAL) BY MOUTH DAILY.  30 tablet  3  . nystatin (MYCOSTATIN) 100000 UNIT/ML suspension TAKE 5 MLS (500,000 UNITS TOTAL) BY MOUTH 4 (FOUR) TIMES DAILY.  240 mL  0   No current facility-administered medications for this visit.    No Known Allergies  History   Social History  . Marital Status: Single    Spouse Name: N/A    Number of Children: N/A  . Years of Education: N/A   Occupational History  . [employer]/Retired     Parttime delivers flowers   Social History Main Topics  . Smoking status: Never Smoker   . Smokeless tobacco: Never Used  . Alcohol Use: No  . Drug Use: No  . Sexual Activity: No   Other Topics Concern  . Not on file   Social History Narrative   Caffeine: 2-3 cups/day, 1 gallon iced tea   Lives alone.  Widower.  2 dogs at home.   Lots of driving.   Activity: walking - 20min-1hr daily   Diet: some water, vegetables daily, occasional fruits    Family History  Problem Relation Age of Onset  . Heart disease Mother     MI  . Hypertension Mother   . Stroke Mother     ?  . Stroke Father   . Diabetes Sister   . Hypertension Sister   . Hypertension Sister   . Hypertension Sister   . Stroke Sister     recurrent mini strokes  . Cancer Neg Hx     Physical Exam: Filed Vitals:   12/26/13 1450  BP: 130/90  Pulse: 93  Height: 5\' 11"  (1.803 m)  Weight: 164 lb (74.39 kg)    GEN- The patient is elderly appearing, alert and oriented x 3 today.   Head- normocephalic, atraumatic Eyes-  Sclera clear, conjunctiva pink Ears- hearing intact Oropharynx- clear Neck- supple  Lungs- Clear to ausculation bilaterally, normal work of breathing Heart- Regular rate and rhythm, no murmurs, rubs or gallops, PMI not laterally displaced GI- soft, NT, ND, + BS Extremities- no clubbing, cyanosis, or edema  ekg today reveals sinus rhythm with first degree AV block (PR 276msec), otherwise normal  ekg  Assessment and Plan:  1. Second degree AV block No indication for pacing at this time Avoid AV nodal agents  2. HTN Stable I will stop hctz as he is also on lasix.  I would about him being on 2 diuretics given his advanced age.  3. Prior CVA Followed by Charlott Holler.  Return to see  me in a year

## 2013-12-26 NOTE — Patient Instructions (Signed)
Your physician wants you to follow-up in: 12 months with Dr Vallery Ridge will receive a reminder letter in the mail two months in advance. If you don't receive a letter, please call our office to schedule the follow-up appointment.   Your physician has recommended you make the following change in your medication:  1) Stop HCTZ

## 2014-01-01 ENCOUNTER — Other Ambulatory Visit: Payer: Self-pay | Admitting: Family Medicine

## 2014-01-01 NOTE — Telephone Encounter (Signed)
Ok to refill? Rx isn't on current med list, but notes say he is still taking it. I'm just a little confused. Thanks!

## 2014-01-02 ENCOUNTER — Other Ambulatory Visit: Payer: Self-pay | Admitting: Family Medicine

## 2014-01-02 NOTE — Telephone Encounter (Signed)
Stopped by cardiology.  plz remind patient.  Refill denied.

## 2014-01-02 NOTE — Telephone Encounter (Signed)
Spoke with patient's son and he confirmed that his father is not taking HCTZ and it must've been an error from the pharmacy.

## 2014-01-17 ENCOUNTER — Encounter: Payer: Self-pay | Admitting: Nurse Practitioner

## 2014-01-17 ENCOUNTER — Ambulatory Visit: Payer: Medicare Other | Admitting: Nurse Practitioner

## 2014-01-17 ENCOUNTER — Ambulatory Visit (INDEPENDENT_AMBULATORY_CARE_PROVIDER_SITE_OTHER): Payer: Medicare Other | Admitting: Nurse Practitioner

## 2014-01-17 ENCOUNTER — Encounter: Payer: Medicare Other | Admitting: Nurse Practitioner

## 2014-01-17 VITALS — BP 158/98 | HR 80 | Wt 168.0 lb

## 2014-01-17 DIAGNOSIS — I639 Cerebral infarction, unspecified: Secondary | ICD-10-CM

## 2014-01-17 DIAGNOSIS — I6350 Cerebral infarction due to unspecified occlusion or stenosis of unspecified cerebral artery: Secondary | ICD-10-CM

## 2014-01-17 NOTE — Progress Notes (Signed)
PATIENT: Christopher [name] DOB: 07-14-1924  REASON FOR VISIT: routine follow up for stroke HISTORY FROM: patient and daughter  HISTORY OF PRESENT ILLNESS: 80 year old Caucasian male with left pontine infarct secondary to high-grade proximal basilar/terminal left vertebral artery stenosis in September 2013. Vascular risk factors of intracranial stenosis, hypertension and hyperlipidemia.   01/17/14:  He returns for followup after last visit on 03/19/2013 with Dr. Leonie Man. He is accompanied by his daughter who provides some of the history. Patient appears to be extremely hard of hearing.  His carotid doppler in August 2014 was negative.  He continues to have mild dizziness off and on and has fallen down once in November which resulted in broken ribs, a pneumothorax and 30 day stay in rehab at [facility]. He denies any ongoing new focal neurological symptoms suggestive of stroke. He remains on Plavix and seems to tolerate intake well. He states his blood pressure is under good control though it is slightly elevated in the office today at 158/98.   ROS:  14 system review of systems is positive for fatigue, activity change, eye itching, blurred vision, hearing loss, trouble swallowing, wheezing, shortness of breath, choking, cold intolerance, heat intolerance, excessive thirst, excessive eating, flushing, dizziness, headache, weakness, incontinence of bladder, urianry frequency, walking difficulty, neck pain, neck stiffness.  ALLERGIES: No Known Allergies  HOME MEDICATIONS: Outpatient Prescriptions Prior to Visit  Medication Sig Dispense Refill  . atorvastatin (LIPITOR) 10 MG tablet Take 10 mg by mouth daily.      . clopidogrel (PLAVIX) 75 MG tablet TAKE 1 TABLET EVERY DAY  30 tablet  3  . docusate sodium (COLACE) 100 MG capsule Take 1 capsule (100 mg total) by mouth daily as needed for mild constipation.  90 capsule  3  . furosemide (LASIX) 20 MG tablet TAKE 1 TABLET EVERY DAY  30 tablet  3  .  KLOR-CON M20 20 MEQ tablet TAKE 1 TABLET BY MOUTH EVERY DAY  30 tablet  3  . lisinopril (PRINIVIL,ZESTRIL) 10 MG tablet Take 1 tablet (10 mg total) by mouth daily.  30 tablet  5  . loratadine (CLARITIN) 10 MG tablet TAKE 1 TABLET BY MOUTH EVERY DAY  30 tablet  6  . Melatonin 10 MG CAPS Take 1 capsule by mouth at bedtime as needed.      . Multiple Vitamin (MULTIVITAMIN) tablet Take 1 tablet by mouth daily.      Marland Kitchen nystatin (MYCOSTATIN) 100000 UNIT/ML suspension TAKE 5 MLS (500,000 UNITS TOTAL) BY MOUTH 4 (FOUR) TIMES DAILY.  240 mL  0  . Omega-3 Fatty Acids (FISH OIL) 1000 MG CAPS Take 1,000 mg by mouth daily.      . pantoprazole (PROTONIX) 40 MG tablet TAKE 1 TABLET EVERY DAY  30 tablet  9  . sertraline (ZOLOFT) 25 MG tablet TAKE 1 TABLET (25 MG TOTAL) BY MOUTH DAILY.  30 tablet  3  . sertraline (ZOLOFT) 25 MG tablet TAKE 1 TABLET (25 MG TOTAL) BY MOUTH DAILY.  30 tablet  3   No facility-administered medications prior to visit.     PHYSICAL EXAM  Filed Vitals:   01/17/14 0832  BP: 158/98  Pulse: 80  Weight: 168 lb (76.204 kg)   Body mass index is 23.44 kg/(m^2). No exam data present   Physical Exam  General: well developed, well nourished, seated, in no evident distress  Head: head normocephalic and atraumatic. Orohparynx benign  Neck: supple with no carotid or supraclavicular bruits  Cardiovascular: regular rate  and rhythm, no murmurs  Musculoskeletal: no deformity   Neurologic Exam  Mental Status: Awake and fully alert. Oriented to place and time. Recent and remote memory intact. Attention span, concentration and fund of knowledge appropriate. Mood and affect appropriate.  Cranial Nerves: Fundoscopic exam not done. Pupils equal, briskly reactive to light. Extraocular movements full without nystagmus. Visual fields full to confrontation. Hearing is diminished bilaterally. Facial sensation intact. Face, tongue, palate moves normally and symmetrically.  Motor: Normal bulk and tone.  Normal strength in all tested extremity muscles. Mildly diminished fine finger movements on the right.  Sensory: intact to touch and pinprick and vibratory.  Coordination: Rapid alternating movements normal in all extremities. Finger-to-nose and heel-to-shin performed accurately bilaterally.  Gait and Station: Arises from chair with mild difficulty. Stance is normal. Gait demonstrates normal stride length and balance. Unable to heel, toe and tandem walk without difficulty. Using cane for assistance. Reflexes: 1+ and symmetric.    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 Vascular risk factors of intracranial stenosis, hypertension and hyperlipidemia. Patient has done well on medical therapy.   PLAN:  Continue Plavix for secondary stroke prevention with strict control of hypertension with blood pressure goal below 140/90 and lipids with LDL cholesterol goal below 100 mg percent.   Letter was provided for clearance to stop Plavix for 5 days prior to dental procedure. Return for followup yearly or sooner as needed.  Philmore Pali, MSN, NP-C 01/17/2014, 9:17 AM [facility] [address], [place], [place] [address] [phone]  Note: This document was prepared with digital dictation and possible smart phrase technology. Any transcriptional errors that result from this process are unintentional.

## 2014-01-17 NOTE — Patient Instructions (Signed)
Continue Plavix for secondary stroke prevention with strict control of hypertension with blood pressure goal below 140/90 and lipids with LDL cholesterol goal below 100 mg percent.   Return for followup in 6 months, sooner as needed.  Stroke Prevention Some medical conditions and behaviors are associated with an increased chance of having a stroke. You may prevent a stroke by making healthy choices and managing medical conditions. HOW CAN I REDUCE MY RISK OF HAVING A STROKE?   Stay physically active. Get at least 30 minutes of activity on most or all days.  Do not smoke. It may also be helpful to avoid exposure to secondhand smoke.  Limit alcohol use. Moderate alcohol use is considered to be:  No more than 2 drinks per day for men.  No more than 1 drink per day for nonpregnant women.  Eat healthy foods. This involves  Eating 5 or more servings of fruits and vegetables a day.  Following a diet that addresses high blood pressure (hypertension), high cholesterol, diabetes, or obesity.  Manage your cholesterol levels.  A diet low in saturated fat, trans fat, and cholesterol and high in fiber may control cholesterol levels.  Take any prescribed medicines to control cholesterol as directed by your health care provider.  Manage your diabetes.  A controlled-carbohydrate, controlled-sugar diet is recommended to manage diabetes.  Take any prescribed medicines to control diabetes as directed by your health care provider.  Control your hypertension.  A low-salt (sodium), low-saturated fat, low-trans fat, and low-cholesterol diet is recommended to manage hypertension.  Take any prescribed medicines to control hypertension as directed by your health care provider.  Maintain a healthy weight.  A reduced-calorie, low-sodium, low-saturated fat, low-trans fat, low-cholesterol diet is recommended to manage weight.  Stop drug abuse.  Avoid taking birth control pills.  Talk to your health  care provider about the risks of taking birth control pills if you are over 36 years old, smoke, get migraines, or have ever had a blood clot.  Get evaluated for sleep disorders (sleep apnea).  Talk to your health care provider about getting a sleep evaluation if you snore a lot or have excessive sleepiness.  Take medicines as directed by your health care provider.  For some people, aspirin or blood thinners (anticoagulants) are helpful in reducing the risk of forming abnormal blood clots that can lead to stroke. If you have the irregular heart rhythm of atrial fibrillation, you should be on a blood thinner unless there is a good reason you cannot take them.  Understand all your medicine instructions.  Make sure that other other conditions (such as anemia or atherosclerosis) are addressed. SEEK IMMEDIATE MEDICAL CARE IF:   You have sudden weakness or numbness of the face, arm, or leg, especially on one side of the body.  Your face or eyelid droops to one side.  You have sudden confusion.  You have trouble speaking (aphasia) or understanding.  You have sudden trouble seeing in one or both eyes.  You have sudden trouble walking.  You have dizziness.  You have a loss of balance or coordination.  You have a sudden, severe headache with no known cause.  You have new chest pain or an irregular heartbeat. Any of these symptoms may represent a serious problem that is an emergency. Do not wait to see if the symptoms will go away. Get medical help at once. Call your local emergency services  (911 in [place] Do not drive yourself to the hospital. Document Released: 09/02/2004  Document Revised: 05/16/2013 Document Reviewed: 01/26/2013 [facility] Patient Information 2014 [place].

## 2014-01-17 NOTE — Progress Notes (Signed)
[facility] [address] [place]. [place] [address] [phone]       OFFICE FOLLOW-UP NOTE  [name] Date of Birth:  1923/09/07 Medical Record Number:  [id number]   HPI: 80 year old Caucasian male with left pontine infarct secondary to high-grade proximal basilar/terminal left vertebral artery stenosis in September 2013. Vascular risk factors of intracranial stenosis, hypertension and hyperlipidemia. 03/19/2013 He returns for followup after last visit on 09/18/12. He is accompanied by his son who provides most of the history. Patient appears to be extremely hard of hearing. He recently underwent removal of basal cell carcinoma from the left ear in March of 2014. He was also seen in the emergency room for shortness of breath and diagnosed with congestive heart failure. He was placed on diuretic for a week which seemed to help. History and has decreased energy, shortness of breath and leg swelling. She continues to have mild dizziness off and on and has fallen down once. He denies any ongoing new focal neurological symptoms suggestive of stroke. He remains on Plavix and seems to tolerate intake well. He states his blood pressure is under good control though it is slightly elevated in the office today at 151/97. ROS:   14 system review of systems is positive for fatigue, leg swelling, hearing loss, trouble swallowing, shortness of breath, constipation, allergies, dizziness, passing out, headache, not enough sleep, decreased energy and appetite.  PMH:  Past Medical History  Diagnosis Date  . GERD (gastroesophageal reflux disease) 08/1996    with large HH and partially intrathoracic stomach  . Hyperlipemia 08/1986  . Hypertension Pre 1996  . Osteoporosis 2006    dexa T -2.8 (05/2012), T11 compression fx  . Pneumonia 33 yoa  . History of Doppler echocardiogram 05/1995    MVP, MOD AI, MILD TR, MILD MR  . Elevated PSA     decided against further eval  . Diverticulosis    . Bilateral sensorineural hearing loss 02/2012    some conductive left side  . Compression fracture     multiple  . AV block, 2nd degree 04/2012    seen cards, monitor for now, pt does not desire monitor/pacer  . BCC (basal cell carcinoma of skin) 08/2012    s/p excision Allyson Sabal)  . HOH (hard of hearing)   . Depression   . Chronic kidney disease     elevated PSA-no tx  . CVA (cerebral infarction) 04/20/2012    04/16/12 IMPRESSION:  1. 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 No mass effect or hemorrhage. Basilar artery perforator territory.  2. See MRA findings below.  3. Otherwise stable MRI appearance of the brain since 2011 including extensive perivascular spaces (etat crible appearance).    . Fall at home 06/12/2013    multiple rib fractures; traumatic hemopneumothorax/notes 06/12/2013 (06/14/2013)  . Hiatal hernia     Social History:  History   Social History  . Marital Status: Single    Spouse Name: N/A    Number of Children: 2  . Years of Education: N/A   Occupational History  . [employer]/Retired     Parttime delivers flowers   Social History Main Topics  . Smoking status: Never Smoker   . Smokeless tobacco: Never Used  . Alcohol Use: No  . Drug Use: No  . Sexual Activity: No   Other Topics Concern  . Not on file   Social History Narrative   Caffeine: 2-3 cups/day, 1 gallon iced tea  Lives alone.  Widower.  2 dogs at home.   Lots of driving.   Activity: walking - 71min-1hr daily   Diet: some water, vegetables daily, occasional fruits    Medications:   Current Outpatient Prescriptions on File Prior to Visit  Medication Sig Dispense Refill  . atorvastatin (LIPITOR) 10 MG tablet Take 10 mg by mouth daily.      . clopidogrel (PLAVIX) 75 MG tablet TAKE 1 TABLET EVERY DAY  30 tablet  3  . docusate sodium (COLACE) 100 MG capsule Take 1 capsule (100 mg total) by mouth daily as needed for mild constipation.  90 capsule  3  .  furosemide (LASIX) 20 MG tablet TAKE 1 TABLET EVERY DAY  30 tablet  3  . KLOR-CON M20 20 MEQ tablet TAKE 1 TABLET BY MOUTH EVERY DAY  30 tablet  3  . lisinopril (PRINIVIL,ZESTRIL) 10 MG tablet Take 1 tablet (10 mg total) by mouth daily.  30 tablet  5  . loratadine (CLARITIN) 10 MG tablet TAKE 1 TABLET BY MOUTH EVERY DAY  30 tablet  6  . Melatonin 10 MG CAPS Take 1 capsule by mouth at bedtime as needed.      . Multiple Vitamin (MULTIVITAMIN) tablet Take 1 tablet by mouth daily.      Marland Kitchen nystatin (MYCOSTATIN) 100000 UNIT/ML suspension TAKE 5 MLS (500,000 UNITS TOTAL) BY MOUTH 4 (FOUR) TIMES DAILY.  240 mL  0  . Omega-3 Fatty Acids (FISH OIL) 1000 MG CAPS Take 1,000 mg by mouth daily.      . pantoprazole (PROTONIX) 40 MG tablet TAKE 1 TABLET EVERY DAY  30 tablet  9  . sertraline (ZOLOFT) 25 MG tablet TAKE 1 TABLET (25 MG TOTAL) BY MOUTH DAILY.  30 tablet  3   No current facility-administered medications on file prior to visit.    Allergies:  No Known Allergies There were no vitals filed for this visit.  Physical Exam General: well developed, well nourished, seated, in no evident distress Head: head normocephalic and atraumatic. Orohparynx benign Neck: supple with no carotid or supraclavicular bruits Cardiovascular: regular rate and rhythm, no murmurs Musculoskeletal: no deformity Skin:  no rash/petichiae. 1+ pedal edema Vascular:  Normal pulses all extremities  Neurologic Exam Mental Status: Awake and fully alert. Oriented to place and time. Recent and remote memory intact. Attention span, concentration and fund of knowledge appropriate. Mood and affect appropriate.  Cranial Nerves: Fundoscopic exam reveals sharp disc margins. Pupils equal, briskly reactive to light. Extraocular movements full without nystagmus. Visual fields full to confrontation. Hearing is diminished bilaterally. Facial sensation intact. Face, tongue, palate moves normally and symmetrically.  Motor: Normal bulk and tone.  Normal strength in all tested extremity muscles. Mildly diminished fine finger movements on the right. Orbits left-to-right upper extremity. Sensory.: intact to tough and pinprick and vibratory.  Coordination: Rapid alternating movements normal in all extremities. Finger-to-nose and heel-to-shin performed accurately bilaterally. Gait and Station: Arises from chair without difficulty. Stance is normal. Gait demonstrates normal stride length and balance . Able to heel, toe and tandem walk without difficulty.  Reflexes: 1+ and symmetric. Toes downgoing.   NIHSS  0 Modified Rankin  2  ASSESSMENT: 80 year old Caucasian male with left pontine infarct secondary to high-grade proximal basilar/terminal left vertebral artery stenosis in September 2013. Vascular risk factors of intracranial stenosis, hypertension and hyperlipidemia. Patient has done well on medical therapy.    PLAN: Continue Plavix for secondary stroke prevention with strict control of hypertension with blood pressure  goal below 130/90 and lipids with LDL cholesterol goal below 100 mg percent. Check followup transcranial Doppler and carotid ultrasound study. Return for followup in 6 months with Charlott Holler, NP      This encounter was created in error - please disregard.

## 2014-01-29 ENCOUNTER — Other Ambulatory Visit: Payer: Self-pay | Admitting: Family Medicine

## 2014-03-19 ENCOUNTER — Other Ambulatory Visit: Payer: Self-pay | Admitting: Family Medicine

## 2014-03-21 IMAGING — CR DG PORTABLE PELVIS
1 series · 1 of 1 positions shown · non-contrast
Comparison: No priors.

CLINICAL DATA: History of fall. Evaluate for potential fracture.

EXAM:
PORTABLE PELVIS 1-2 VIEWS

[AP]
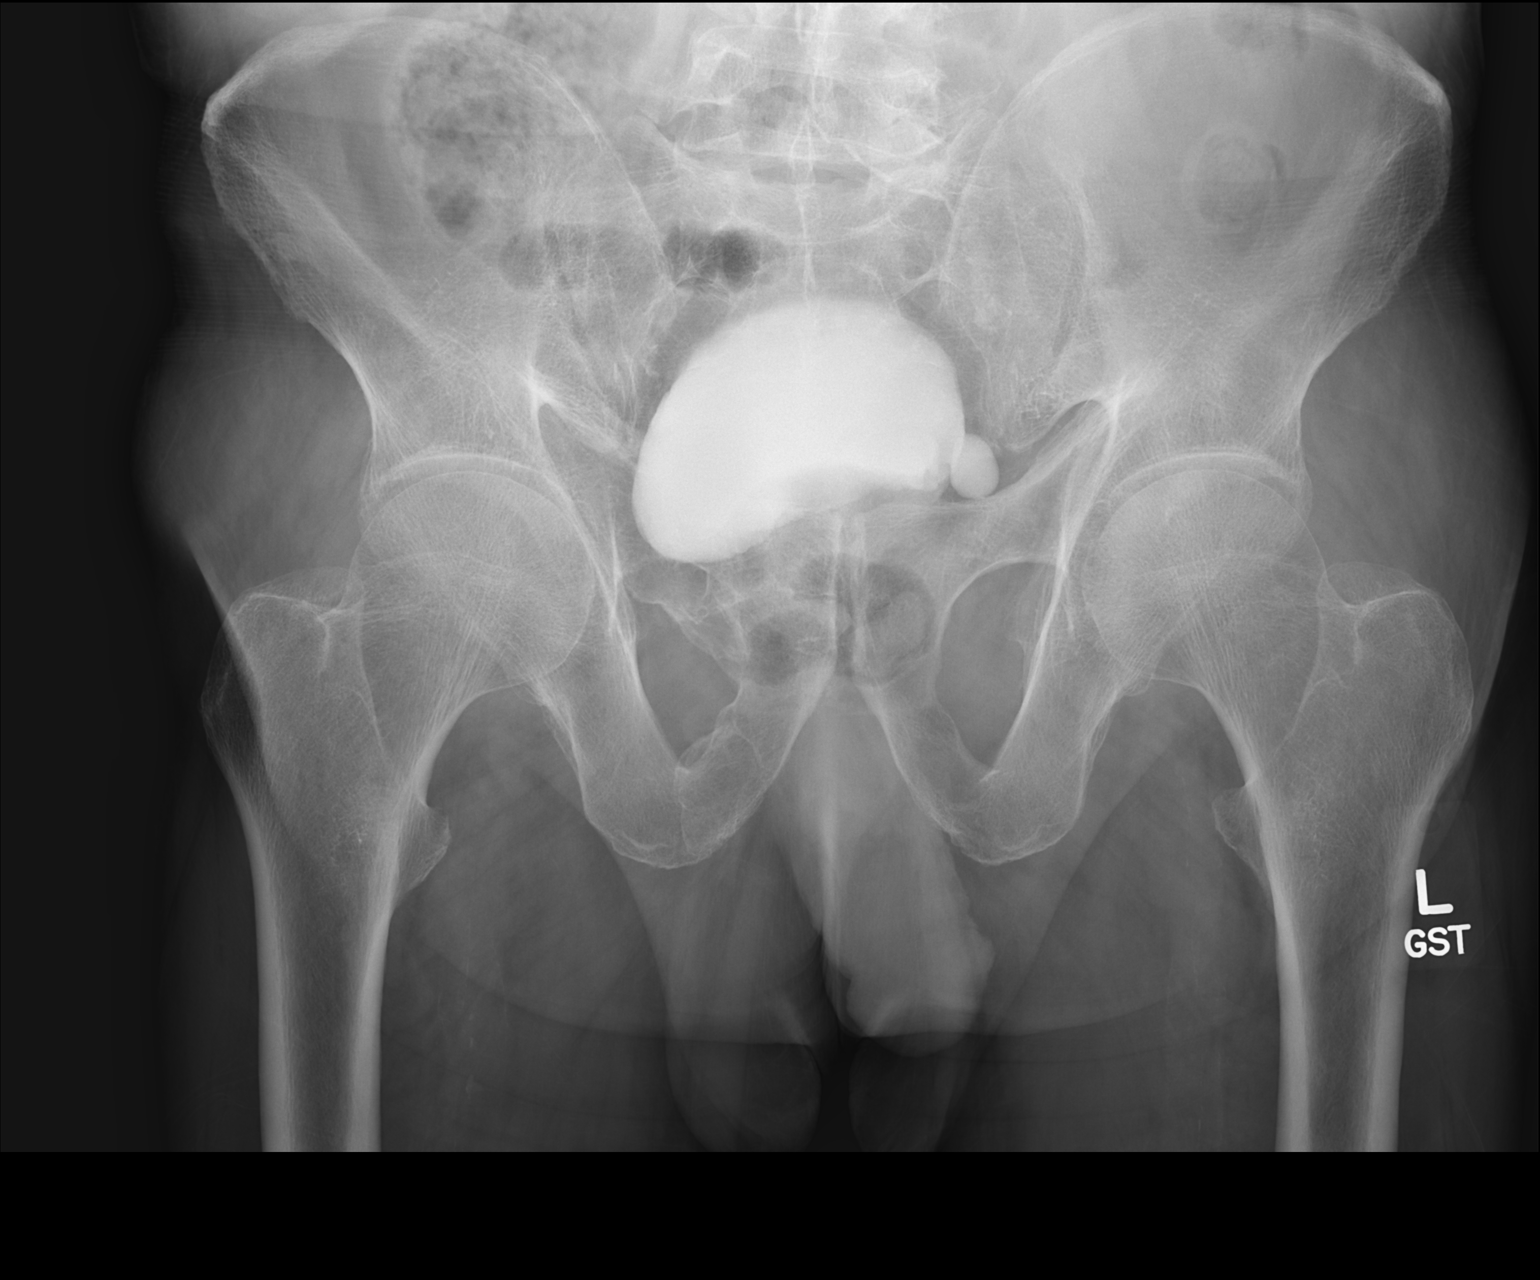

[1 of 1 positions shown; findings below may reference images not displayed]

FINDINGS: AP view of the pelvis demonstrates definite acute displaced
fractures of the bony pelvic ring. Old healed right superior
inferior pubic fractures. Bilateral proximal femurs as visualized
intact, and the femoral heads appear to be located bilaterally on
this single view examination. Urinary bladder is filled with
contrast material, and there is irregularity of the inferior surface
of the bladder, likely related to an enlarged prostate gland. Small
bladder diverticulum incidentally noted on the left side.
IMPRESSION: 1. No evidence of significant acute traumatic injury to the bony
pelvis
2. Small left-sided bladder diverticulum incidentally noted.
3. Old healed fractures of the right superior and inferior pubic
rami.
.

## 2014-03-21 IMAGING — CR DG CHEST 1V PORT
1 series · 1 of 1 positions shown · non-contrast
Comparison: Chest 02/15/2013.

CLINICAL DATA: History of fall complaining of chest pain. Crepitus.

EXAM:
PORTABLE CHEST - 1 VIEW

[AP]
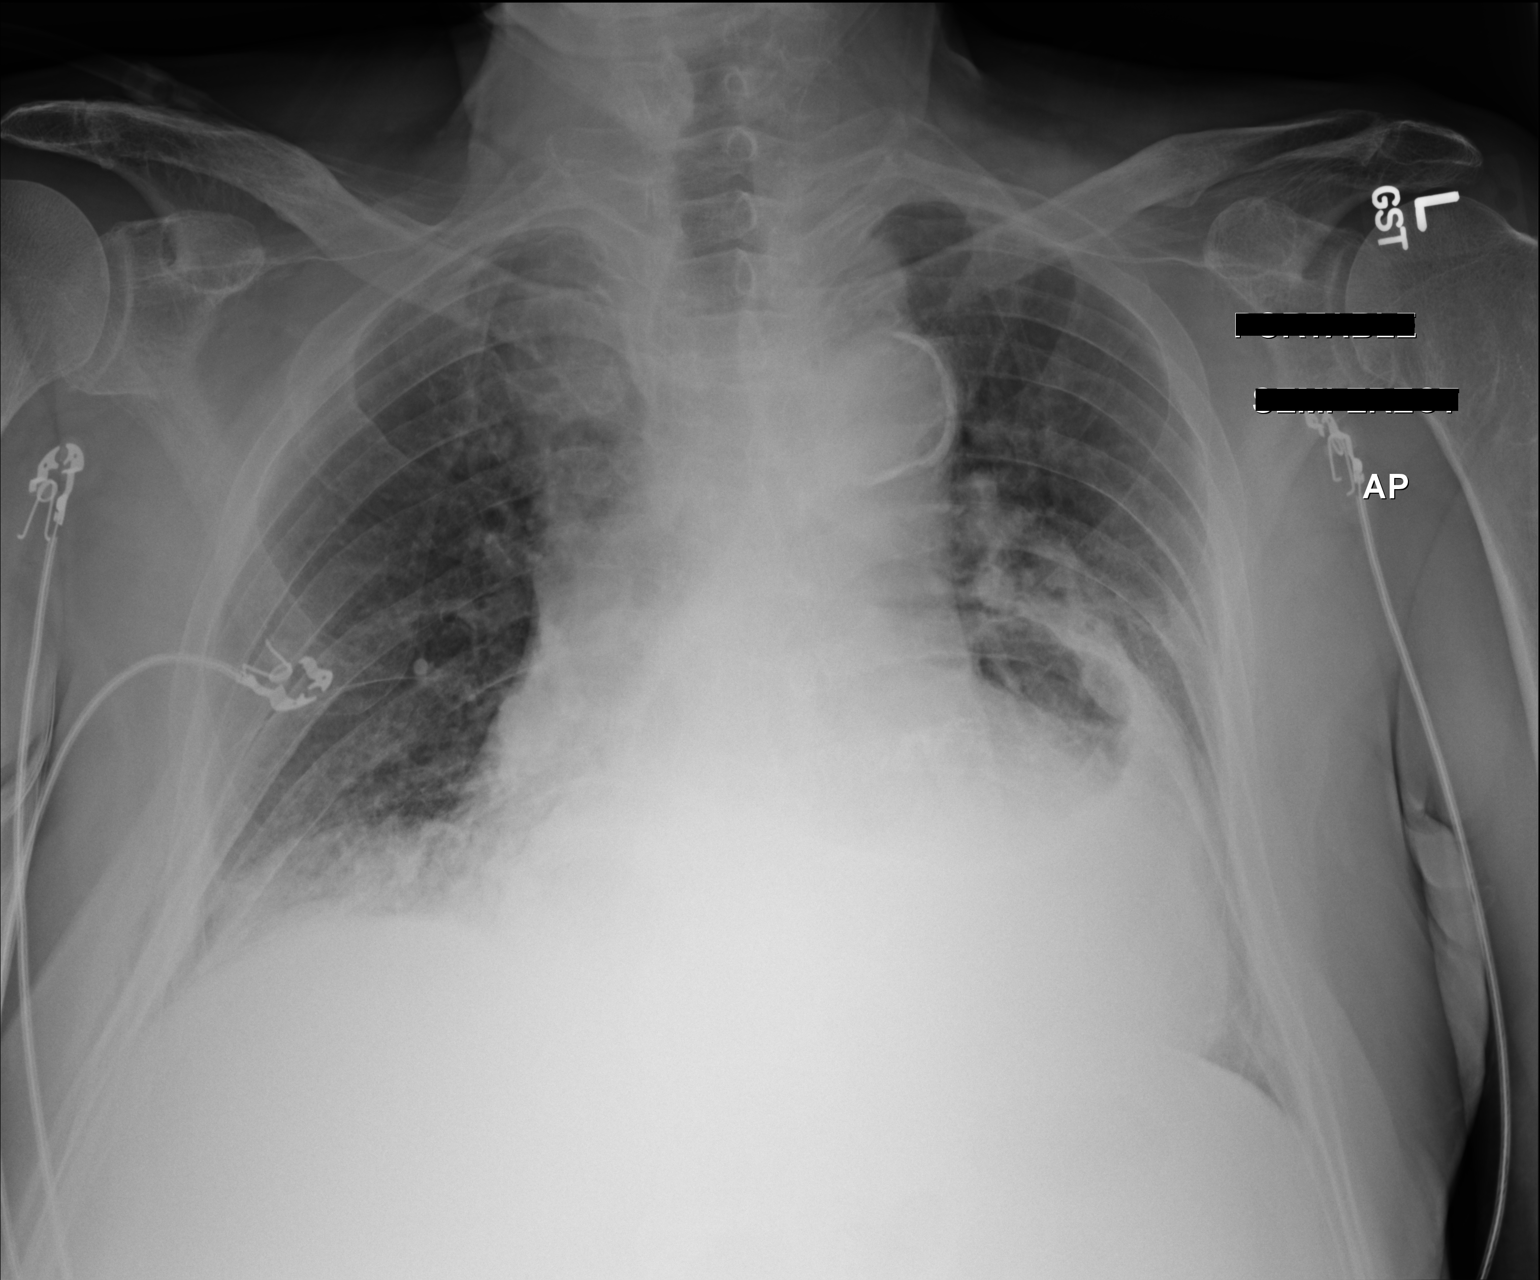

[1 of 1 positions shown; findings below may reference images not displayed]

FINDINGS: Massive hiatal hernia redemonstrated. This is associated with some
passive atelectasis throughout the lower lobes the lungs
bilaterally. No definite consolidative airspace disease. No
pneumothorax. No evidence of pulmonary edema. Heart size appears
mildly enlarged. Mediastinal contours are distorted by patient's
rotation to the right. Atherosclerosis in the thoracic aorta
visualized portions of the bony thorax appear grossly intact.
IMPRESSION: 1. No definite radiographic evidence of significant acute traumatic
injury to the thorax.
2. Massive hiatal hernia redemonstrated with some associated
bibasilar subsegmental atelectasis.
3. Atherosclerosis.
4. Mild cardiomegaly.

## 2014-03-25 ENCOUNTER — Ambulatory Visit (INDEPENDENT_AMBULATORY_CARE_PROVIDER_SITE_OTHER): Payer: Medicare Other | Admitting: Podiatry

## 2014-03-25 DIAGNOSIS — M79609 Pain in unspecified limb: Secondary | ICD-10-CM

## 2014-03-25 DIAGNOSIS — M79676 Pain in unspecified toe(s): Secondary | ICD-10-CM

## 2014-03-25 DIAGNOSIS — B351 Tinea unguium: Secondary | ICD-10-CM

## 2014-03-25 IMAGING — CR DG CHEST 2V
3 series · 3 of 3 positions shown · non-contrast
Comparison: 06/13/2013

CLINICAL DATA: Follow-up rib fractures

EXAM:
CHEST  2 VIEW

[w chest lat (1 of 2)]
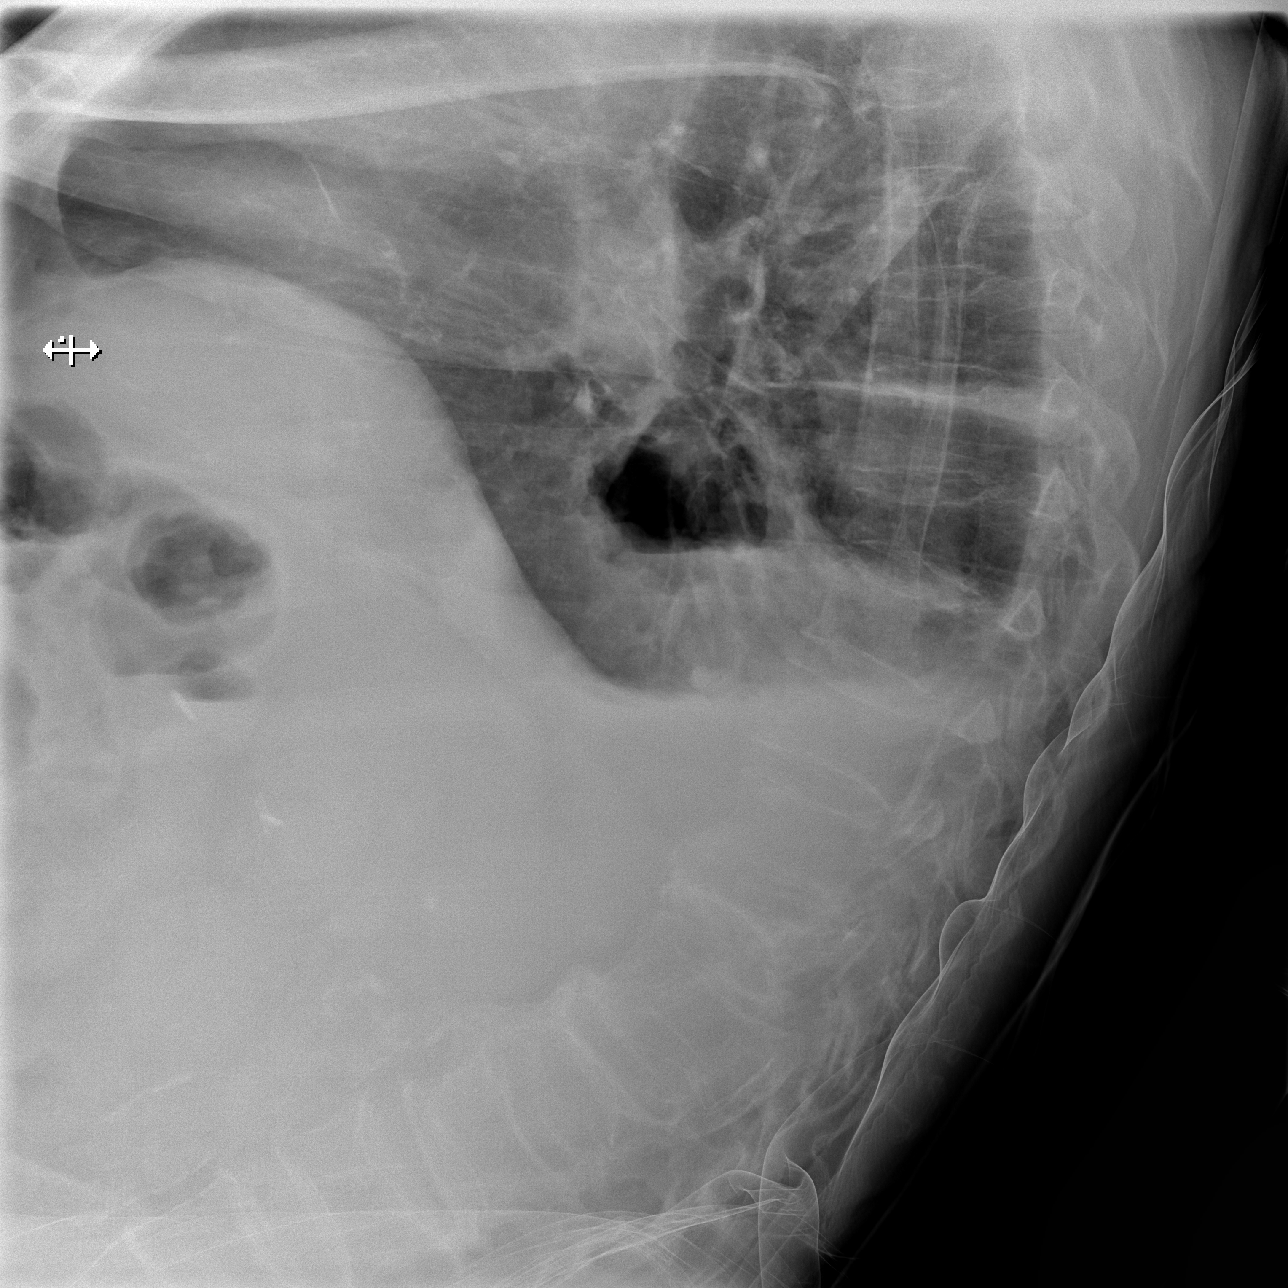

[w chest lat (2 of 2)]
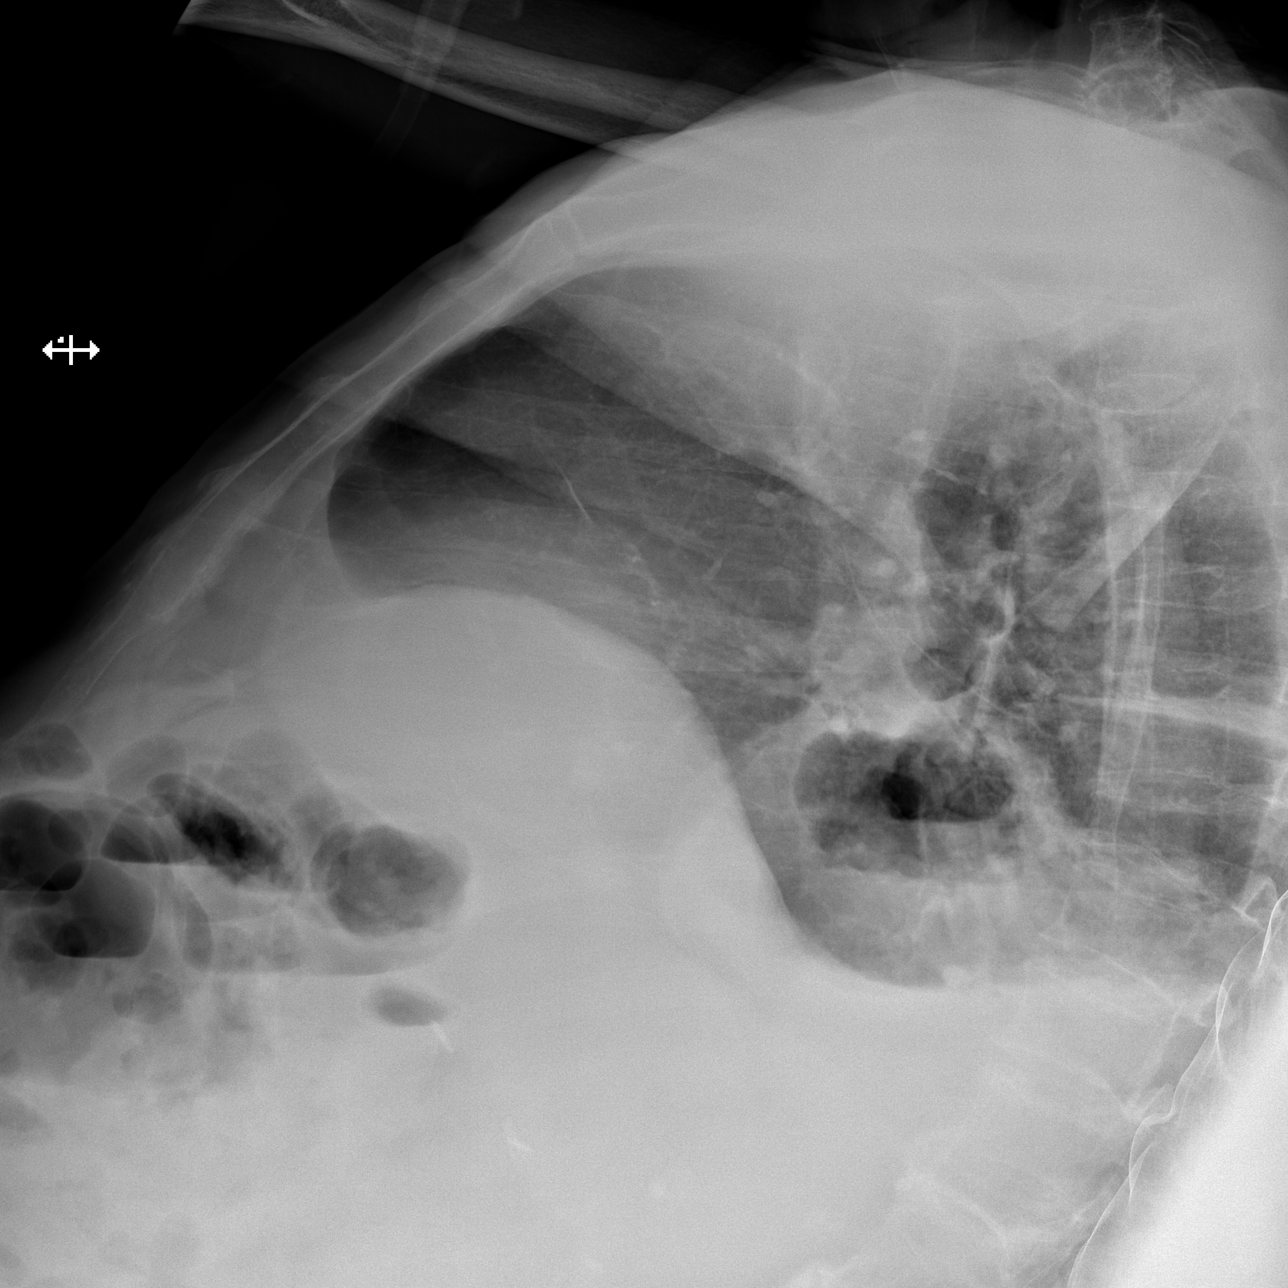

[x chest ap]
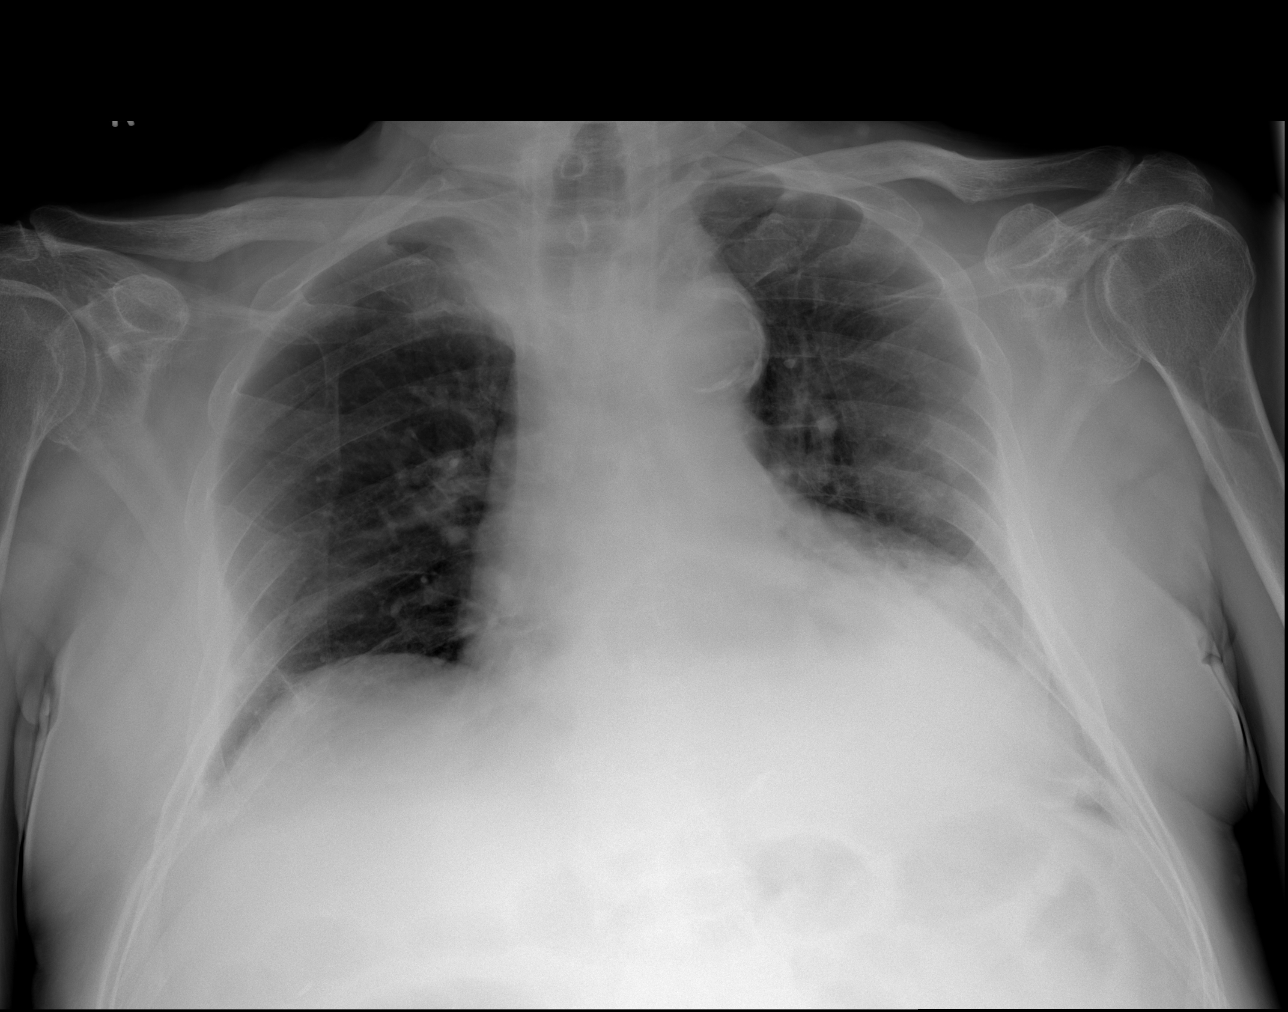

[3 of 3 positions shown; findings below may reference images not displayed]

FINDINGS: Small bilateral pleural effusions, left greater than right.
Superimposed patchy left lower lobe opacity, likely atelectasis. No
pneumothorax.

Cardiomegaly.

Degenerative changes of the visualized thoracolumbar spine. Moderate
compression fracture deformity at T7 with severe at T11 and L1
compression fractures, unchanged from CT.

Mildly displaced right posterior 8th rib fracture. Additional rib
fractures are better visualized on CT.

Cholecystectomy clips.
IMPRESSION: Small bilateral pleural effusions, left greater than right.
Superimposed patchy left lower lobe opacity, likely atelectasis.

Mildly displaced right posterior 8th rib fracture. Additional rib
fractures are better visualized on CT.

No pneumothorax is seen.

## 2014-03-25 NOTE — Patient Instructions (Signed)
Apply antibiotic ointment to the end of the third left toe daily until a scab forms

## 2014-03-25 NOTE — Progress Notes (Signed)
Patient ID: Christopher [name], male   DOB: 1923-08-26, 80 y.o.   MRN: [id number]  Subjective: This patient presents complaining of painful toenails  Objective: Elongated, hypertrophic, discolored toenails 6-10  Assessment: Symptomatic onychomycoses 6-10  Plan: Debrided toenails x10 Slight distal bleeding third left toe treated with Betadine and Band-Aid  Patient will continue a topical antibiotic ointment to the distal third left toe daily until the scab forms  Reappoint x3 months

## 2014-04-09 DIAGNOSIS — K449 Diaphragmatic hernia without obstruction or gangrene: Secondary | ICD-10-CM

## 2014-04-09 HISTORY — DX: Diaphragmatic hernia without obstruction or gangrene: K44.9

## 2014-04-13 ENCOUNTER — Other Ambulatory Visit: Payer: Self-pay | Admitting: Family Medicine

## 2014-04-16 ENCOUNTER — Ambulatory Visit (INDEPENDENT_AMBULATORY_CARE_PROVIDER_SITE_OTHER)
Admission: RE | Admit: 2014-04-16 | Discharge: 2014-04-16 | Disposition: A | Discharge status: Home / Self Care | Payer: Medicare Other | Source: Ambulatory Visit | Attending: Family Medicine | Admitting: Family Medicine

## 2014-04-16 ENCOUNTER — Ambulatory Visit (INDEPENDENT_AMBULATORY_CARE_PROVIDER_SITE_OTHER): Payer: Medicare Other | Admitting: Family Medicine

## 2014-04-16 ENCOUNTER — Encounter: Payer: Self-pay | Admitting: Family Medicine

## 2014-04-16 VITALS — BP 130/96 | HR 84 | Temp 98.2°F | Ht 68.0 in | Wt 169.0 lb

## 2014-04-16 DIAGNOSIS — R0600 Dyspnea, unspecified: Secondary | ICD-10-CM

## 2014-04-16 DIAGNOSIS — I6350 Cerebral infarction due to unspecified occlusion or stenosis of unspecified cerebral artery: Secondary | ICD-10-CM

## 2014-04-16 DIAGNOSIS — T148XXA Other injury of unspecified body region, initial encounter: Secondary | ICD-10-CM

## 2014-04-16 DIAGNOSIS — I10 Essential (primary) hypertension: Secondary | ICD-10-CM

## 2014-04-16 DIAGNOSIS — R7303 Prediabetes: Secondary | ICD-10-CM

## 2014-04-16 DIAGNOSIS — IMO0002 Reserved for concepts with insufficient information to code with codable children: Secondary | ICD-10-CM

## 2014-04-16 DIAGNOSIS — R0989 Other specified symptoms and signs involving the circulatory and respiratory systems: Secondary | ICD-10-CM

## 2014-04-16 DIAGNOSIS — Z0000 Encounter for general adult medical examination without abnormal findings: Secondary | ICD-10-CM

## 2014-04-16 DIAGNOSIS — I6990 Unspecified sequelae of unspecified cerebrovascular disease: Secondary | ICD-10-CM

## 2014-04-16 DIAGNOSIS — R0609 Other forms of dyspnea: Secondary | ICD-10-CM

## 2014-04-16 DIAGNOSIS — R972 Elevated prostate specific antigen [PSA]: Secondary | ICD-10-CM

## 2014-04-16 DIAGNOSIS — M810 Age-related osteoporosis without current pathological fracture: Secondary | ICD-10-CM

## 2014-04-16 DIAGNOSIS — R7309 Other abnormal glucose: Secondary | ICD-10-CM

## 2014-04-16 DIAGNOSIS — E785 Hyperlipidemia, unspecified: Secondary | ICD-10-CM

## 2014-04-16 DIAGNOSIS — Z23 Encounter for immunization: Secondary | ICD-10-CM

## 2014-04-16 LAB — COMPREHENSIVE METABOLIC PANEL
ALT: 13 U/L (ref 0–53)
AST: 17 U/L (ref 0–37)
Albumin: 4 g/dL (ref 3.5–5.2)
Alkaline Phosphatase: 50 U/L (ref 39–117)
BILIRUBIN TOTAL: 0.9 mg/dL (ref 0.2–1.2)
BUN: 15 mg/dL (ref 6–23)
CALCIUM: 9.9 mg/dL (ref 8.4–10.5)
CHLORIDE: 104 meq/L (ref 96–112)
CO2: 28 mEq/L (ref 19–32)
CREATININE: 1.2 mg/dL (ref 0.4–1.5)
GFR: 62.53 mL/min (ref >60.00)
Glucose, Bld: 104 mg/dL — ABNORMAL HIGH (ref 70–99)
Potassium: 4.4 mEq/L (ref 3.5–5.1)
Sodium: 141 mEq/L (ref 135–145)
Total Protein: 8.1 g/dL (ref 6.0–8.3)

## 2014-04-16 LAB — LIPID PANEL
Cholesterol: 148 mg/dL (ref 0–200)
HDL: 39.8 mg/dL (ref >39.00)
LDL Cholesterol: 75 mg/dL (ref 0–99)
NONHDL: 108.2
Total CHOL/HDL Ratio: 4
Triglycerides: 164 mg/dL — ABNORMAL HIGH (ref 0.0–149.0)
VLDL: 32.8 mg/dL (ref 0.0–40.0)

## 2014-04-16 LAB — HEMOGLOBIN A1C: HEMOGLOBIN A1C: 5.9 % (ref 4.6–6.5)

## 2014-04-16 NOTE — Assessment & Plan Note (Signed)
Persistent DOE noted. Check CXR today in h/o traumatic hemopneumothorax and rales LLL today.

## 2014-04-16 NOTE — Assessment & Plan Note (Signed)
Chronic, stable. Continue to monitor.  

## 2014-04-16 NOTE — Progress Notes (Signed)
BP 130/96  Pulse 84  Temp(Src) 98.2 F (36.8 C) (Oral)  Ht 5\' 8"  (1.727 m)  Wt 169 lb (76.658 kg)  BMI 25.70 kg/m2   CC: medicare wellness  Subjective:    Patient ID: Christopher [name], male    DOB: 11/26/1923, 80 y.o.   MRN: [id number]  HPI: Christopher [name] is a 80 y.o. male presenting on 04/16/2014 for Annual Exam   Notes marked dyspnea on exertion. Able to walk 1-2 blocks at home without difficulty. Notes dyspnea comes on more with home exercise program recommended by PT or with brisk movements. Denies chest pain, palpitations with this.   Osteoporosis - with known compression fractures. However has upcoming dental work for full dentures. Continues cvitamin D daily.  Dysphagia s/p eval by GI - thought due to brainstem CVA. MBSS repeated and pt aware of recommendations.  Wt Readings from Last 3 Encounters:  04/16/14 169 lb (76.658 kg)  01/17/14 168 lb (76.204 kg)  12/26/13 164 lb (74.39 kg)   Body mass index is 25.7 kg/(m^2).  Failed hearing screen - aides too expensive. No longer drives. Vision screen passed today.  Denies falls in last year. Denies anhedonia, depression, sadness.   Preventative: COLONOSCOPY Date: 05/01/2007 divericulosis, int hemorrhoids - Dr. Fuller Plan  Prostate screening - elevated PSA in past, known enlarged prostate but smooth. Had aged out in past. Will recheck this visit  dexa Date: 05/2012 T score -2.8 hip, T11 compression fx Flu shot yearly.  Tetanus - 2013.  Pneumovax 1998 zostavax 2010  Advanced directives: has living will at home - daughter has it. Filled out by lawyer. Daughter is HCPOA Duard Brady).   Caffeine: 2-3 cups/day, 1 gallon iced tea Lives alone.  Widower.  2 dogs at home. 2 children live nearby Edu: 8th grade Activity: walking - 3min-1hr daily Diet: some water, vegetables daily, occasional fruits  Relevant past medical, surgical, family and social history reviewed and updated as indicated.  Allergies and medications reviewed and  updated. Current Outpatient Prescriptions on File Prior to Visit  Medication Sig  . atorvastatin (LIPITOR) 10 MG tablet TAKE 1 TABLET (10 MG TOTAL) BY MOUTH DAILY.  Marland Kitchen clopidogrel (PLAVIX) 75 MG tablet TAKE 1 TABLET EVERY DAY  . docusate sodium (COLACE) 100 MG capsule Take 1 capsule (100 mg total) by mouth daily as needed for mild constipation.  . furosemide (LASIX) 20 MG tablet TAKE 1 TABLET EVERY DAY  . KLOR-CON M20 20 MEQ tablet TAKE 1 TABLET BY MOUTH EVERY DAY  . lisinopril (PRINIVIL,ZESTRIL) 10 MG tablet TAKE 1 TABLET BY MOUTH EVERY DAY  . loratadine (CLARITIN) 10 MG tablet TAKE 1 TABLET BY MOUTH EVERY DAY  . Melatonin 10 MG CAPS Take 1 capsule by mouth at bedtime as needed.  . Multiple Vitamin (MULTIVITAMIN) tablet Take 1 tablet by mouth daily.  . Omega-3 Fatty Acids (FISH OIL) 1000 MG CAPS Take 1,000 mg by mouth daily.  . pantoprazole (PROTONIX) 40 MG tablet TAKE 1 TABLET EVERY DAY  . sertraline (ZOLOFT) 25 MG tablet TAKE 1 TABLET (25 MG TOTAL) BY MOUTH DAILY.   No current facility-administered medications on file prior to visit.   Past Medical History  Diagnosis Date  . GERD (gastroesophageal reflux disease) 08/1996    with large HH and partially intrathoracic stomach  . Hyperlipemia 08/1986  . Hypertension Pre 1996  . Osteoporosis 2006    dexa T -2.8 (05/2012), T11 compression fx  . Pneumonia 66 yoa  . History of Doppler echocardiogram  05/1995    MVP, MOD AI, MILD TR, MILD MR  . Elevated PSA     decided against further eval  . Diverticulosis   . Bilateral sensorineural hearing loss 02/2012    some conductive left side  . Compression fracture     multiple  . AV block, 2nd degree 04/2012    seen cards, monitor for now, pt does not desire monitor/pacer  . BCC (basal cell carcinoma of skin) 08/2012    s/p excision Allyson Sabal)  . HOH (hard of hearing)   . Depression   . Chronic kidney disease     elevated PSA-no tx  . CVA (cerebral infarction) 04/20/2012    04/16/12 IMPRESSION:   1. 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 No mass effect or hemorrhage. Basilar artery perforator territory.  2. See MRA findings below.  3. Otherwise stable MRI appearance of the brain since 2011 including extensive perivascular spaces (etat crible appearance)  . Fall at home 06/12/2013    multiple rib fractures; traumatic hemopneumothorax/notes 06/12/2013 (06/14/2013)  . Hiatal hernia     Review of Systems Per HPI unless specifically indicated above    Objective:    BP 130/96  Pulse 84  Temp(Src) 98.2 F (36.8 C) (Oral)  Ht 5\' 8"  (1.727 m)  Wt 169 lb (76.658 kg)  BMI 25.70 kg/m2  Physical Exam  Nursing note and vitals reviewed. Constitutional: He is oriented to person, place, and time. He appears well-developed and well-nourished. No distress.  HENT:  Head: Normocephalic and atraumatic.  Right Ear: Hearing, tympanic membrane, external ear and ear canal normal.  Left Ear: Hearing, tympanic membrane, external ear and ear canal normal.  Nose: Nose normal.  Mouth/Throat: Uvula is midline, oropharynx is clear and moist and mucous membranes are normal. No oropharyngeal exudate, posterior oropharyngeal edema or posterior oropharyngeal erythema.  Poor dentition  Eyes: Conjunctivae and EOM are normal. Pupils are equal, round, and reactive to light. No scleral icterus.  Neck: Normal range of motion. Neck supple. Carotid bruit is not present. No thyromegaly present.  Cardiovascular: Regular rhythm, normal heart sounds and intact distal pulses.  Tachycardia present.   No murmur heard. Pulses:      Radial pulses are 2+ on the right side, and 2+ on the left side.  Mild tachycardia  Pulmonary/Chest: Effort normal. No respiratory distress. He has no wheezes. He has rales (LLL).  Abdominal: Soft. Normal appearance. He exhibits no distension and no mass. Bowel sounds are increased. There is no hepatosplenomegaly. There is no tenderness. There is no rigidity, no rebound,  no guarding and negative Murphy's sign.  Musculoskeletal: Normal range of motion. He exhibits edema (tr pedal edema).  Lymphadenopathy:    He has no cervical adenopathy.  Neurological: He is alert and oriented to person, place, and time.  CN grossly intact, station and gait intact Recall 3/3 Calculation 4/5 serial 3  Skin: Skin is warm and dry. No rash noted.  Psychiatric: He has a normal mood and affect. His behavior is normal. Judgment and thought content normal.       Assessment & Plan:   Problem List Items Addressed This Visit   PROSTATE SPECIFIC ANTIGEN, ELEVATED     Again reviewed with patient, discussed ddx of this namely BPH vs prostate malignancy, discussed pros/cons of further evaluation. Pt continues to defer further eval which is very reasonable. Will not repeat PSA or DRE.    Prediabetes     Chronic, stable. Continue to monitor.  Relevant Orders      Hemoglobin A1c      DG Chest 2 View   OSTEOPOROSIS     Discussed this with patient and daughter - in upcoming dental work, will continue to defer bisphosphonate. Did not tolerate oral bisphosphonate in past.  continue vit D daily - daughter will check on dose he takes.    Relevant Medications      Cholecalciferol (VITAMIN D PO)   Medicare annual wellness visit, subsequent - Primary     I have personally reviewed the Medicare Annual Wellness questionnaire and have noted 1. The patient's medical and social history 2. Their use of alcohol, tobacco or illicit drugs 3. Their current medications and supplements 4. The patient's functional ability including ADL's, fall risks, home safety risks and hearing or visual impairment. 5. Diet and physical activity 6. Evidence for depression or mood disorders The patients weight, height, BMI have been recorded in the chart.  Hearing and vision has been addressed. I have made referrals, counseling and provided education to the patient based review of the above and I have provided  the pt with a written personalized care plan for preventive services. Provider list updated - see scanned questionairre. Advanced directives: has living will at home - daughter has it. Filled out by lawyer. Daughter is HCPOA Duard Brady).   Reviewed preventative protocols and updated unless pt declined.    Late effects of CVA (cerebrovascular accident)   HYPERTENSION     Chronic, stable. Continue regimen.    HYPERLIPIDEMIA     Check FLP today. Continue lipitor 10mg  daily.    Relevant Orders      Lipid panel      Comprehensive metabolic panel   DOE (dyspnea on exertion)     Persistent DOE noted. Check CXR today in h/o traumatic hemopneumothorax and rales LLL today.    Compression fracture     Discussed osteoporosis treatment - will continue vit D regularly and will reassess after dental work completed.     Other Visit Diagnoses   Immunization due        Relevant Orders       Pneumococcal conjugate vaccine 13-valent (Completed)    Need for prophylactic vaccination and inoculation against influenza        Relevant Orders       Flu Vaccine QUAD 36+ mos PF IM (Fluarix Quad PF) (Completed)        Follow up plan: Return in about 1 year (around 04/17/2015), or as needed, for medicare wellness.

## 2014-04-16 NOTE — Assessment & Plan Note (Signed)
Again reviewed with patient, discussed ddx of this namely BPH vs prostate malignancy, discussed pros/cons of further evaluation. Pt continues to defer further eval which is very reasonable. Will not repeat PSA or DRE.

## 2014-04-16 NOTE — Patient Instructions (Addendum)
Flu and prevnar today. Blood work today. Xray of chest today. Let me know when you're done with dental work to start osteoporosis medication. Double check on vitamin D dose and let me know. Good to see you today, call us with questions.

## 2014-04-16 NOTE — Assessment & Plan Note (Signed)
Discussed this with patient and daughter - in upcoming dental work, will continue to defer bisphosphonate. Did not tolerate oral bisphosphonate in past.  continue vit D daily - daughter will check on dose he takes.

## 2014-04-16 NOTE — Assessment & Plan Note (Signed)
Discussed osteoporosis treatment - will continue vit D regularly and will reassess after dental work completed.

## 2014-04-16 NOTE — Assessment & Plan Note (Signed)
Chronic, stable. Continue regimen. 

## 2014-04-16 NOTE — Assessment & Plan Note (Signed)
Check FLP today. Continue lipitor 10mg  daily.

## 2014-04-16 NOTE — Progress Notes (Signed)
Pre visit review using our clinic review tool, if applicable. No additional management support is needed unless otherwise documented below in the visit note. 

## 2014-04-16 NOTE — Assessment & Plan Note (Signed)
I have personally reviewed the Medicare Annual Wellness questionnaire and have noted 1. The patient's medical and social history 2. Their use of alcohol, tobacco or illicit drugs 3. Their current medications and supplements 4. The patient's functional ability including ADL's, fall risks, home safety risks and hearing or visual impairment. 5. Diet and physical activity 6. Evidence for depression or mood disorders The patients weight, height, BMI have been recorded in the chart.  Hearing and vision has been addressed. I have made referrals, counseling and provided education to the patient based review of the above and I have provided the pt with a written personalized care plan for preventive services. Provider list updated - see scanned questionairre. Advanced directives: has living will at home - daughter has it. Filled out by lawyer. Daughter is HCPOA Duard Brady).   Reviewed preventative protocols and updated unless pt declined.

## 2014-04-18 ENCOUNTER — Telehealth: Payer: Self-pay

## 2014-04-18 ENCOUNTER — Encounter: Payer: Self-pay | Admitting: Family Medicine

## 2014-04-18 NOTE — Telephone Encounter (Signed)
pt was seen 04/16/14 and was to cb with dosage of Vit D. Christopher [name] said Vit D is 1000 iu and pt takes one daily. Med list updated.

## 2014-04-30 ENCOUNTER — Other Ambulatory Visit: Payer: Self-pay | Admitting: Family Medicine

## 2014-05-27 ENCOUNTER — Other Ambulatory Visit: Payer: Self-pay | Admitting: Family Medicine

## 2014-07-01 ENCOUNTER — Ambulatory Visit (INDEPENDENT_AMBULATORY_CARE_PROVIDER_SITE_OTHER): Payer: Medicare Other | Admitting: Podiatry

## 2014-07-01 ENCOUNTER — Encounter: Payer: Self-pay | Admitting: Podiatry

## 2014-07-01 DIAGNOSIS — M79676 Pain in unspecified toe(s): Secondary | ICD-10-CM

## 2014-07-01 DIAGNOSIS — B351 Tinea unguium: Secondary | ICD-10-CM

## 2014-07-01 NOTE — Progress Notes (Signed)
Patient ID: Christopher [name], male   DOB: 1924-07-11, 80 y.o.   MRN: [id number]  Subjective: This patient presents for ongoing debridement of painful toenails  Objective: Hypertrophic, elongated, discolored toenails 6-10  Assessment: Symptomatic onychomycoses 6-10  Plan: Debrided toenails 10 without a bleeding  Reappoint 3 months

## 2014-07-05 ENCOUNTER — Other Ambulatory Visit: Payer: Self-pay | Admitting: Family Medicine

## 2014-07-18 ENCOUNTER — Other Ambulatory Visit: Payer: Self-pay | Admitting: Family Medicine

## 2014-07-29 ENCOUNTER — Other Ambulatory Visit: Payer: Self-pay | Admitting: Family Medicine

## 2014-08-16 ENCOUNTER — Encounter (HOSPITAL_COMMUNITY): Payer: Self-pay | Admitting: Emergency Medicine

## 2014-08-16 ENCOUNTER — Emergency Department (HOSPITAL_COMMUNITY): Payer: Medicare Other

## 2014-08-16 ENCOUNTER — Inpatient Hospital Stay (HOSPITAL_COMMUNITY): Payer: Medicare Other

## 2014-08-16 ENCOUNTER — Inpatient Hospital Stay (HOSPITAL_COMMUNITY)
Admission: EM | Admit: 2014-08-16 | Discharge: 2014-08-20 | DRG: 054 | Disposition: A | Discharge status: Skilled Nursing Facility | Payer: Medicare Other | Attending: Internal Medicine | Admitting: Internal Medicine

## 2014-08-16 DIAGNOSIS — I63132 Cerebral infarction due to embolism of left carotid artery: Secondary | ICD-10-CM

## 2014-08-16 DIAGNOSIS — Z79899 Other long term (current) drug therapy: Secondary | ICD-10-CM | POA: Diagnosis not present

## 2014-08-16 DIAGNOSIS — M810 Age-related osteoporosis without current pathological fracture: Secondary | ICD-10-CM | POA: Diagnosis present

## 2014-08-16 DIAGNOSIS — K219 Gastro-esophageal reflux disease without esophagitis: Secondary | ICD-10-CM | POA: Diagnosis present

## 2014-08-16 DIAGNOSIS — I129 Hypertensive chronic kidney disease with stage 1 through stage 4 chronic kidney disease, or unspecified chronic kidney disease: Secondary | ICD-10-CM | POA: Diagnosis present

## 2014-08-16 DIAGNOSIS — Z7902 Long term (current) use of antithrombotics/antiplatelets: Secondary | ICD-10-CM | POA: Diagnosis not present

## 2014-08-16 DIAGNOSIS — G9389 Other specified disorders of brain: Secondary | ICD-10-CM

## 2014-08-16 DIAGNOSIS — E785 Hyperlipidemia, unspecified: Secondary | ICD-10-CM | POA: Diagnosis present

## 2014-08-16 DIAGNOSIS — D496 Neoplasm of unspecified behavior of brain: Secondary | ICD-10-CM | POA: Diagnosis present

## 2014-08-16 DIAGNOSIS — I639 Cerebral infarction, unspecified: Secondary | ICD-10-CM | POA: Diagnosis present

## 2014-08-16 DIAGNOSIS — G936 Cerebral edema: Secondary | ICD-10-CM | POA: Diagnosis present

## 2014-08-16 DIAGNOSIS — I10 Essential (primary) hypertension: Secondary | ICD-10-CM

## 2014-08-16 DIAGNOSIS — I63512 Cerebral infarction due to unspecified occlusion or stenosis of left middle cerebral artery: Secondary | ICD-10-CM | POA: Diagnosis present

## 2014-08-16 DIAGNOSIS — N189 Chronic kidney disease, unspecified: Secondary | ICD-10-CM | POA: Diagnosis present

## 2014-08-16 DIAGNOSIS — R4781 Slurred speech: Secondary | ICD-10-CM | POA: Diagnosis present

## 2014-08-16 DIAGNOSIS — Z85820 Personal history of malignant melanoma of skin: Secondary | ICD-10-CM

## 2014-08-16 DIAGNOSIS — H903 Sensorineural hearing loss, bilateral: Secondary | ICD-10-CM | POA: Diagnosis present

## 2014-08-16 LAB — URINALYSIS, ROUTINE W REFLEX MICROSCOPIC
Bilirubin Urine: NEGATIVE
GLUCOSE, UA: NEGATIVE mg/dL
Hgb urine dipstick: NEGATIVE
Ketones, ur: NEGATIVE mg/dL
LEUKOCYTES UA: NEGATIVE
NITRITE: NEGATIVE
PH: 6 (ref 5.0–8.0)
PROTEIN: 30 mg/dL — AB
Specific Gravity, Urine: 1.019 (ref 1.005–1.030)
UROBILINOGEN UA: 0.2 mg/dL (ref 0.0–1.0)

## 2014-08-16 LAB — RAPID URINE DRUG SCREEN, HOSP PERFORMED
AMPHETAMINES: NOT DETECTED
BENZODIAZEPINES: NOT DETECTED
Barbiturates: NOT DETECTED
Cocaine: NOT DETECTED
OPIATES: NOT DETECTED
Tetrahydrocannabinol: NOT DETECTED

## 2014-08-16 LAB — I-STAT CHEM 8, ED
BUN: 17 mg/dL (ref 6–23)
Calcium, Ion: 1.14 mmol/L (ref 1.13–1.30)
Chloride: 104 mEq/L (ref 96–112)
Creatinine, Ser: 1.2 mg/dL (ref 0.50–1.35)
GLUCOSE: 147 mg/dL — AB (ref 70–99)
HCT: 45 % (ref 39.0–52.0)
Hemoglobin: 15.3 g/dL (ref 13.0–17.0)
POTASSIUM: 3.7 mmol/L (ref 3.5–5.1)
Sodium: 141 mmol/L (ref 135–145)
TCO2: 23 mmol/L (ref 0–100)

## 2014-08-16 LAB — COMPREHENSIVE METABOLIC PANEL
ALBUMIN: 3.7 g/dL (ref 3.5–5.2)
ALK PHOS: 55 U/L (ref 39–117)
ALT: 13 U/L (ref 0–53)
ANION GAP: 8 (ref 5–15)
AST: 21 U/L (ref 0–37)
BILIRUBIN TOTAL: 0.8 mg/dL (ref 0.3–1.2)
BUN: 14 mg/dL (ref 6–23)
CO2: 27 mmol/L (ref 19–32)
Calcium: 9.5 mg/dL (ref 8.4–10.5)
Chloride: 104 mEq/L (ref 96–112)
Creatinine, Ser: 1.2 mg/dL (ref 0.50–1.35)
GFR calc non Af Amer: 52 mL/min — ABNORMAL LOW (ref >90)
GFR, EST AFRICAN AMERICAN: 60 mL/min — AB (ref >90)
Glucose, Bld: 150 mg/dL — ABNORMAL HIGH (ref 70–99)
POTASSIUM: 3.7 mmol/L (ref 3.5–5.1)
Sodium: 139 mmol/L (ref 135–145)
TOTAL PROTEIN: 7.4 g/dL (ref 6.0–8.3)

## 2014-08-16 LAB — CBC
HCT: 41.9 % (ref 39.0–52.0)
HEMOGLOBIN: 14.1 g/dL (ref 13.0–17.0)
MCH: 30.2 pg (ref 26.0–34.0)
MCHC: 33.7 g/dL (ref 30.0–36.0)
MCV: 89.7 fL (ref 78.0–100.0)
Platelets: 191 10*3/uL (ref 150–400)
RBC: 4.67 MIL/uL (ref 4.22–5.81)
RDW: 14 % (ref 11.5–15.5)
WBC: 9.8 10*3/uL (ref 4.0–10.5)

## 2014-08-16 LAB — DIFFERENTIAL
BASOS ABS: 0 10*3/uL (ref 0.0–0.1)
Basophils Relative: 0 % (ref 0–1)
Eosinophils Absolute: 0 10*3/uL (ref 0.0–0.7)
Eosinophils Relative: 0 % (ref 0–5)
LYMPHS ABS: 0.9 10*3/uL (ref 0.7–4.0)
LYMPHS PCT: 9 % — AB (ref 12–46)
Monocytes Absolute: 0.4 10*3/uL (ref 0.1–1.0)
Monocytes Relative: 4 % (ref 3–12)
NEUTROS ABS: 8.5 10*3/uL — AB (ref 1.7–7.7)
Neutrophils Relative %: 87 % — ABNORMAL HIGH (ref 43–77)

## 2014-08-16 LAB — URINE MICROSCOPIC-ADD ON

## 2014-08-16 LAB — PROTIME-INR
INR: 1.03 (ref 0.00–1.49)
Prothrombin Time: 13.6 seconds (ref 11.6–15.2)

## 2014-08-16 LAB — ETHANOL

## 2014-08-16 LAB — APTT: aPTT: 30 seconds (ref 24–37)

## 2014-08-16 LAB — I-STAT TROPONIN, ED: TROPONIN I, POC: 0.01 ng/mL (ref 0.00–0.08)

## 2014-08-16 MED ORDER — VITAMIN D 1000 UNITS PO TABS
1000.0000 [IU] | ORAL_TABLET | Freq: Every day | ORAL | Status: DC
  Administered 2014-08-17 – 2014-08-19 (×3): 1000 [IU] via ORAL
  Filled 2014-08-16 (×3): qty 1

## 2014-08-16 MED ORDER — STROKE: EARLY STAGES OF RECOVERY BOOK
Freq: Once | Status: AC
Start: 2014-08-16 — End: 2014-08-17
  Administered 2014-08-17: 01:00:00
  Filled 2014-08-16: qty 1

## 2014-08-16 MED ORDER — ATORVASTATIN CALCIUM 10 MG PO TABS
10.0000 mg | ORAL_TABLET | Freq: Every day | ORAL | Status: DC
  Administered 2014-08-17 – 2014-08-19 (×3): 10 mg via ORAL
  Filled 2014-08-16 (×3): qty 1

## 2014-08-16 MED ORDER — GADOBENATE DIMEGLUMINE 529 MG/ML IV SOLN: 15.0000 mL | Freq: Once | INTRAVENOUS | Status: AC | PRN

## 2014-08-16 MED ORDER — POLYVINYL ALCOHOL 1.4 % OP SOLN
1.0000 [drp] | OPHTHALMIC | Status: DC | PRN
  Filled 2014-08-16: qty 15

## 2014-08-16 MED ORDER — ALPRAZOLAM 0.25 MG PO TABS: 0.2500 mg | ORAL_TABLET | Freq: Once | ORAL | Status: AC | PRN

## 2014-08-16 MED ORDER — MELATONIN 10 MG PO CAPS: 1.0000 | ORAL_CAPSULE | Freq: Every evening | ORAL | Status: DC | PRN

## 2014-08-16 MED ORDER — SERTRALINE HCL 50 MG PO TABS
25.0000 mg | ORAL_TABLET | Freq: Every day | ORAL | Status: DC
  Administered 2014-08-17 – 2014-08-19 (×3): 25 mg via ORAL
  Filled 2014-08-16 (×3): qty 1

## 2014-08-16 MED ORDER — ACETAMINOPHEN 500 MG PO TABS: 500.0000 mg | ORAL_TABLET | Freq: Every day | ORAL | Status: DC | PRN

## 2014-08-16 MED ORDER — FUROSEMIDE 20 MG PO TABS
20.0000 mg | ORAL_TABLET | Freq: Every day | ORAL | Status: DC
  Administered 2014-08-17: 20 mg via ORAL
  Filled 2014-08-16: qty 1

## 2014-08-16 MED ORDER — ADULT MULTIVITAMIN W/MINERALS CH
1.0000 | ORAL_TABLET | Freq: Every day | ORAL | Status: DC
  Administered 2014-08-17 – 2014-08-20 (×4): 1 via ORAL
  Filled 2014-08-16 (×4): qty 1

## 2014-08-16 MED ORDER — SERTRALINE HCL 50 MG PO TABS: 25.0000 mg | ORAL_TABLET | Freq: Every day | ORAL | Status: DC

## 2014-08-16 MED ORDER — POTASSIUM CHLORIDE CRYS ER 20 MEQ PO TBCR: 20.0000 meq | EXTENDED_RELEASE_TABLET | Freq: Every day | ORAL | Status: DC

## 2014-08-16 MED ORDER — CLOPIDOGREL BISULFATE 75 MG PO TABS
75.0000 mg | ORAL_TABLET | Freq: Every day | ORAL | Status: DC
  Administered 2014-08-17: 75 mg via ORAL
  Filled 2014-08-16: qty 1

## 2014-08-16 MED ORDER — POTASSIUM CHLORIDE CRYS ER 20 MEQ PO TBCR
20.0000 meq | EXTENDED_RELEASE_TABLET | Freq: Every day | ORAL | Status: DC
  Administered 2014-08-17 – 2014-08-19 (×3): 20 meq via ORAL
  Filled 2014-08-16 (×3): qty 1

## 2014-08-16 MED ORDER — MELATONIN 3 MG PO TABS
3.0000 mg | ORAL_TABLET | Freq: Every evening | ORAL | Status: DC | PRN
  Filled 2014-08-16: qty 1

## 2014-08-16 MED ORDER — LORATADINE 10 MG PO TABS: 10.0000 mg | ORAL_TABLET | Freq: Every day | ORAL | Status: DC

## 2014-08-16 MED ORDER — HEPARIN SODIUM (PORCINE) 5000 UNIT/ML IJ SOLN
5000.0000 [IU] | Freq: Three times a day (TID) | INTRAMUSCULAR | Status: DC
  Administered 2014-08-17 – 2014-08-20 (×11): 5000 [IU] via SUBCUTANEOUS
  Filled 2014-08-16 (×11): qty 1

## 2014-08-16 MED ORDER — PANTOPRAZOLE SODIUM 40 MG PO TBEC
40.0000 mg | DELAYED_RELEASE_TABLET | Freq: Every day | ORAL | Status: DC
  Administered 2014-08-17 – 2014-08-20 (×4): 40 mg via ORAL
  Filled 2014-08-16 (×4): qty 1

## 2014-08-16 MED ORDER — POLYETHYL GLYCOL-PROPYL GLYCOL 0.4-0.3 % OP SOLN: Freq: Every day | OPHTHALMIC | Status: DC

## 2014-08-16 MED ORDER — DOCUSATE SODIUM 100 MG PO CAPS: 100.0000 mg | ORAL_CAPSULE | Freq: Every day | ORAL | Status: DC | PRN

## 2014-08-16 MED ORDER — HYDRALAZINE HCL 20 MG/ML IJ SOLN
5.0000 mg | Freq: Once | INTRAMUSCULAR | Status: AC
  Administered 2014-08-16: 5 mg via INTRAVENOUS
  Filled 2014-08-16: qty 1

## 2014-08-16 NOTE — ED Notes (Signed)
[facility] contacted for Code Stroke

## 2014-08-16 NOTE — ED Notes (Signed)
Pt to ED via [facility] after reported having right facial droop with garbled speech.

## 2014-08-16 NOTE — Consult Note (Addendum)
Referring Physician: Alvino Chapel    Chief Complaint: Confusion, difficulty with speech  HPI: Christopher [name] is an 81 y.o. male with a history of stroke who lives alone and at baseline ambulates with a Rolator.  Spoke to his daughter this morning and was at baseline.  When his sone called around mid-day he did not answer the phone.  When he called again later in the afternoon, again he did not answer the phone.  He went to check on his father at that time and his son found the patient on the side of the bed "incoherent".  EMS was called at that time and the patient was brought in for evaluation.  It seemed that the patient had improvement in his speech on arrival but shortly again became unable to be understood.  Code stroke was called at that time.    Date last known well: Date: 08/16/2014 Time last known well: Time: 10:30 tPA Given: No: Outside time window  Past Medical History  Diagnosis Date  . GERD (gastroesophageal reflux disease) 08/1996    with large HH and partially intrathoracic stomach  . Hyperlipemia 08/1986  . Hypertension Pre 1996  . Osteoporosis 2006    dexa T -2.8 (05/2012), T11 compression fx  . Pneumonia 74 yoa  . History of Doppler echocardiogram 05/1995    MVP, MOD AI, MILD TR, MILD MR  . Elevated PSA     decided against further eval  . Diverticulosis   . Bilateral sensorineural hearing loss 02/2012    some conductive left side  . Compression fracture     multiple  . AV block, 2nd degree 04/2012    seen cards, monitor for now, pt does not desire monitor/pacer  . BCC (basal cell carcinoma of skin) 08/2012    s/p excision Allyson Sabal)  . HOH (hard of hearing)   . Depression   . Chronic kidney disease     elevated PSA-no tx  . CVA (cerebral infarction) 04/20/2012    04/16/12 IMPRESSION:  1. 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 No mass effect or hemorrhage. Basilar artery perforator territory.  2. See MRA findings below.  3. Otherwise stable MRI  appearance of the brain since 2011 including extensive perivascular spaces (etat crible appearance)  . Fall at home 06/12/2013    multiple rib fractures; traumatic hemopneumothorax/notes 06/12/2013 (06/14/2013)  . Hiatal hernia 04/2014    mod-large by CXR    Past Surgical History  Procedure Laterality Date  . Cholecystectomy  05/1995  . Exercise treadmill  09/12/2006    NML  . Esophagogastroduodenoscopy  08/18/2004    H. H. Dr. Fuller Plan  . Colonoscopy  05/01/2007    divericulosis, int hemorrhoids - Dr. Fuller Plan  . Dexa  05/2012    T score -2.8 hip, T11 compression fx  . Skin full thickness graft Left 10/19/2012    Procedure: REPAIR OF LEFT EAR WITH TISSUE REARRANGEMENT SKIN GRAFT FULL THICKNESS;  Surgeon: Theodoro Kos, DO;  Location: [place];  Service: Plastics;  Laterality: Left;  . US echocardiography  05/2013    Mod LVH, EF 60-65%, indeterminate diastolic fxn, aortic sclerosis without stenosis, mildly dilated RV with nl sys fxn, mod dilated arch  . Inguinal hernia repair Left 08/14/2010    Dr. Hassell Done  . Cataract extraction w/ intraocular lens  implant, bilateral Bilateral     Family History  Problem Relation Age of Onset  . Heart disease Mother     MI  . Hypertension Mother   .  Stroke Mother     ?  . Stroke Father   . Diabetes Sister   . Hypertension Sister   . Hypertension Sister   . Hypertension Sister   . Stroke Sister     recurrent mini strokes  . Cancer Neg Hx    Social History:  reports that he has never smoked. He has never used smokeless tobacco. He reports that he does not drink alcohol or use illicit drugs.  Allergies: No Known Allergies  Medications: I have reviewed the patient's current medications. Prior to Admission:  Current outpatient prescriptions:  atorvastatin (LIPITOR) 10 MG tablet, TAKE 1 TABLET (10 MG TOTAL) BY MOUTH DAILY., Disp: 30 tablet, Rfl: 6;   Cholecalciferol (VITAMIN D PO), Take 1,000 Units by mouth daily. , Disp: , Rfl: ;    clopidogrel (PLAVIX) 75 MG tablet, TAKE 1 TABLET EVERY DAY, Disp: 30 tablet, Rfl: 6 docusate sodium (COLACE) 100 MG capsule, TAKE 1 CAPSULE (100 MG TOTAL) BY MOUTH DAILY AS NEEDED FOR MILD CONSTIPATION., Disp: 90 capsule, Rfl: 1;   furosemide (LASIX) 20 MG tablet, TAKE 1 TABLET EVERY DAY, Disp: 30 tablet, Rfl: 5;   KLOR-CON M20 20 MEQ tablet, TAKE 1 TABLET BY MOUTH EVERY DAY, Disp: 30 tablet, Rfl: 6;   lisinopril (PRINIVIL,ZESTRIL) 10 MG tablet, TAKE 1 TABLET BY MOUTH EVERY DAY, Disp: 30 tablet, Rfl: 6 loratadine (CLARITIN) 10 MG tablet, TAKE 1 TABLET BY MOUTH EVERY DAY, Disp: 30 tablet, Rfl: 11;   Melatonin 10 MG CAPS, Take 1 capsule by mouth at bedtime as needed., Disp: , Rfl: ;   Multiple Vitamin (MULTIVITAMIN) tablet, Take 1 tablet by mouth daily., Disp: , Rfl: ;   Omega-3 Fatty Acids (FISH OIL) 1000 MG CAPS, Take 1,000 mg by mouth daily., Disp: , Rfl: ;   pantoprazole (PROTONIX) 40 MG tablet, TAKE 1 TABLET EVERY DAY, Disp: 30 tablet, Rfl: 9 sertraline (ZOLOFT) 25 MG tablet, TAKE 1 TABLET (25 MG TOTAL) BY MOUTH DAILY., Disp: 30 tablet, Rfl: 6  ROS: Unable to obtain due to speech difficulties  Physical Examination: Blood pressure 205/106, pulse 37, temperature 98.3 F (36.8 C), temperature source Oral, resp. rate 16, weight 76.658 kg (169 lb), SpO2 96 %.  HEENT-  Normocephalic, no lesions, without obvious abnormality.  Normal external eye and conjunctiva.  Normal TM's bilaterally.  Normal auditory canals and external ears. Normal external nose, mucus membranes and septum.  Normal pharynx. Cardiovascular- S1, S2 normal, pulses palpable throughout   Lungs- chest clear, no wheezing, rales, normal symmetric air entry Abdomen- soft, non-tender; bowel sounds normal; no masses,  no organomegaly Extremities- no edema Lymph-no adenopathy palpable Musculoskeletal-no joint tenderness, deformity or swelling Skin-warm and dry, no hyperpigmentation, vitiligo, or suspicious lesions  Neurological  Examination Mental Status: Awake.  Expressive aphasia except some ability to answer one-word questions appropriately.  Speech slurred.  Able to follow one-step commands without difficulty; requires reinforcement and hand-over-hand demonstration for three-step commands.  Cranial Nerves: II: Discs flat bilaterally; Blinks to bilateral confrontation.  Pupils equal, round, reactive to light and accommodation III,IV, VI: ptosis not present, extra-ocular motions intact bilaterally V,VII: smile symmetric, facial light touch sensation normal bilaterally VIII: hard of hearing  IX,X: gag reflex present XI: bilateral shoulder shrug XII: midline tongue extension Motor: Right : Upper extremity   5/5    Left:     Upper extremity   5/5  Lower extremity   5/5     Lower extremity   5/5 Tone and bulk:normal tone throughout; no atrophy  noted Sensory: Pinprick and light touch intact throughout, bilaterally Deep Tendon Reflexes: 2+ and symmetric throughout Plantars: Right: upgoing   Left: downgoing Cerebellar: normal finger-to-nose and normal heel-to-shin testing intact bilaterally Gait: unable to test due to safety    Laboratory Studies:  Basic Metabolic Panel: No results for input(s): NA, K, CL, CO2, GLUCOSE, BUN, CREATININE, CALCIUM, MG, PHOS in the last 168 hours.  Liver Function Tests: No results for input(s): AST, ALT, ALKPHOS, BILITOT, PROT, ALBUMIN in the last 168 hours. No results for input(s): LIPASE, AMYLASE in the last 168 hours. No results for input(s): AMMONIA in the last 168 hours.  CBC:  Recent Labs Lab 08/16/14 1911  WBC 9.8  NEUTROABS 8.5*  HGB 14.1  HCT 41.9  MCV 89.7  PLT 191    Cardiac Enzymes: No results for input(s): CKTOTAL, CKMB, CKMBINDEX, TROPONINI in the last 168 hours.  BNP: Invalid input(s): POCBNP  CBG: No results for input(s): GLUCAP in the last 168 hours.  Microbiology: Results for orders placed or performed during the hospital encounter of 06/12/13   Clostridium Difficile by PCR     Status: None   Collection Time: 06/17/13  3:10 PM  Result Value Ref Range Status   C difficile by pcr NEGATIVE NEGATIVE Final    Coagulation Studies:  Recent Labs  08/16/14 1911  LABPROT 13.6  INR 1.03    Urinalysis: No results for input(s): COLORURINE, LABSPEC, PHURINE, GLUCOSEU, HGBUR, BILIRUBINUR, KETONESUR, PROTEINUR, UROBILINOGEN, NITRITE, LEUKOCYTESUR in the last 168 hours.  Invalid input(s): APPERANCEUR  Lipid Panel:    Component Value Date/Time   CHOL 148 04/16/2014 1243   TRIG 164.0* 04/16/2014 1243   HDL 39.80 04/16/2014 1243   CHOLHDL 4 04/16/2014 1243   VLDL 32.8 04/16/2014 1243   LDLCALC 75 04/16/2014 1243    HgbA1C:  Lab Results  Component Value Date   HGBA1C 5.9 04/16/2014    Urine Drug Screen:  No results found for: LABOPIA, COCAINSCRNUR, LABBENZ, AMPHETMU, THCU, LABBARB  Alcohol Level: No results for input(s): ETH in the last 168 hours.  Other results: EKG: rate of 69 with prolonged PR interval.    Imaging: Ct Head Wo Contrast  08/16/2014   CLINICAL DATA:  Could stroke, right facial droop  EXAM: CT HEAD WITHOUT CONTRAST  TECHNIQUE: Contiguous axial images were obtained from the base of the skull through the vertex without intravenous contrast.  COMPARISON:  Head CT 06/18/2013  FINDINGS: There is new hypodensity within the medial inferior left temporal lobe (image 14, series 201). No evidence of intracranial hemorrhage. There is generalized cortical atrophy. There is periventricular white matter hypodensities not changed from prior. Paranasal sinuses and mastoid air cells are clear. There is polypoid mucosal thickening in the right maxillary sinus.  IMPRESSION: 1. Acute versus subacute infarction in the medial aspect of the left temporal lobe operculum. 2. No intracranial hemorrhage identified. 3. Atrophy and microvascular disease again demonstrated. Findings conveyed [facility] 08/16/2014  at19:40.   Electronically  Signed   By: Suzy Bouchard M.D.   On: 08/16/2014 19:41    Assessment: 81 y.o. male with a history of stroke on Plavix at home who presents with an aphasia.  Neurological examination otherwise unremarkable.  Head CT personally reviewed and discussed with radiology.  Acute appearing infarct noted in the left temporal lobe.  No evidence of hemorrhage.  Due to time of presentation patient not felt to be a tPA or intervention candidate.  Heart rate on monitor irregular at times.  Last stroke  work up in October of 2014 and was unremarkable.  Further work up recommended at this time.    Stroke Risk Factors - hyperlipidemia and hypertension  Plan: 1. HgbA1c, fasting lipid panel 2. MRI, MRA  of the brain without contrast 3. PT consult, OT consult, Speech consult 4. Echocardiogram 5. Carotid dopplers 6. Prophylactic therapy-Antiplatelet med: Aspirin - dose 325mg  daily 7. NPO until RN stroke swallow screen 8. Telemetry monitoring 9. Frequent neuro checks 10. Hydralazine for BP  Case discussed with Dr. Mordecai Maes, MD [facility] [phone] 08/16/2014, 7:56 PM

## 2014-08-16 NOTE — ED Notes (Signed)
Pt had clear slurred garbled speech on arrival.  After approx 5 mins. Speech cleared and he was able to tell me his date of birth.  After neuro exam, speech became garbled again, unable to tell me his date of birth.  Dr. Alvino Chapel to room, code stroke called.

## 2014-08-16 NOTE — H&P (Signed)
[facility] History and Physical  Christopher [name] MLY:[id number] DOB: 1923-11-03 DOA: 08/16/2014  Referring physician: EDP PCP: Ria Bush, MD   Chief Complaint: Speech difficulty   HPI: Christopher [name] is a 81 y.o. male with h/o stroke, lives alone and ambulates at baseline.  LKW at 10:30 when he spoke with daughter this morning.  When son called around mid day did not answer phone.  Called again later this afternoon, no answer.  When they went to check on him found patient on side of bed "incoherent".  EMS called and patient brought in for evaluation.  Review of Systems: Systems reviewed.  As above, otherwise negative  Past Medical History  Diagnosis Date  . GERD (gastroesophageal reflux disease) 08/1996    with large HH and partially intrathoracic stomach  . Hyperlipemia 08/1986  . Hypertension Pre 1996  . Osteoporosis 2006    dexa T -2.8 (05/2012), T11 compression fx  . Pneumonia 48 yoa  . History of Doppler echocardiogram 05/1995    MVP, MOD AI, MILD TR, MILD MR  . Elevated PSA     decided against further eval  . Diverticulosis   . Bilateral sensorineural hearing loss 02/2012    some conductive left side  . Compression fracture     multiple  . AV block, 2nd degree 04/2012    seen cards, monitor for now, pt does not desire monitor/pacer  . BCC (basal cell carcinoma of skin) 08/2012    s/p excision Allyson Sabal)  . HOH (hard of hearing)   . Depression   . Chronic kidney disease     elevated PSA-no tx  . CVA (cerebral infarction) 04/20/2012    04/16/12 IMPRESSION:  1. 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 No mass effect or hemorrhage. Basilar artery perforator territory.  2. See MRA findings below.  3. Otherwise stable MRI appearance of the brain since 2011 including extensive perivascular spaces (etat crible appearance)  . Fall at home 06/12/2013    multiple rib fractures; traumatic hemopneumothorax/notes 06/12/2013 (06/14/2013)  . Hiatal hernia  04/2014    mod-large by CXR   Past Surgical History  Procedure Laterality Date  . Cholecystectomy  05/1995  . Exercise treadmill  09/12/2006    NML  . Esophagogastroduodenoscopy  08/18/2004    H. H. Dr. Fuller Plan  . Colonoscopy  05/01/2007    divericulosis, int hemorrhoids - Dr. Fuller Plan  . Dexa  05/2012    T score -2.8 hip, T11 compression fx  . Skin full thickness graft Left 10/19/2012    Procedure: REPAIR OF LEFT EAR WITH TISSUE REARRANGEMENT SKIN GRAFT FULL THICKNESS;  Surgeon: Theodoro Kos, DO;  Location: [place];  Service: Plastics;  Laterality: Left;  . US echocardiography  05/2013    Mod LVH, EF 60-65%, indeterminate diastolic fxn, aortic sclerosis without stenosis, mildly dilated RV with nl sys fxn, mod dilated arch  . Inguinal hernia repair Left 08/14/2010    Dr. Hassell Done  . Cataract extraction w/ intraocular lens  implant, bilateral Bilateral    Social History:  reports that he has never smoked. He has never used smokeless tobacco. He reports that he does not drink alcohol or use illicit drugs.  No Known Allergies  Family History  Problem Relation Age of Onset  . Heart disease Mother     MI  . Hypertension Mother   . Stroke Mother     ?  . Stroke Father   . Diabetes Sister   . Hypertension  Sister   . Hypertension Sister   . Hypertension Sister   . Stroke Sister     recurrent mini strokes  . Cancer Neg Hx      Prior to Admission medications   Medication Sig Start Date End Date Taking? Authorizing Provider  atorvastatin (LIPITOR) 10 MG tablet TAKE 1 TABLET (10 MG TOTAL) BY MOUTH DAILY. 05/27/14   Ria Bush, MD  clopidogrel (PLAVIX) 75 MG tablet TAKE 1 TABLET EVERY DAY 07/18/14   Ria Bush, MD  docusate sodium (COLACE) 100 MG capsule TAKE 1 CAPSULE (100 MG TOTAL) BY MOUTH DAILY AS NEEDED FOR MILD CONSTIPATION. 07/05/14   Ria Bush, MD  furosemide (LASIX) 20 MG tablet TAKE 1 TABLET EVERY DAY 04/30/14   Ria Bush, MD  KLOR-CON  M20 20 MEQ tablet TAKE 1 TABLET BY MOUTH EVERY DAY 07/18/14   Ria Bush, MD  lisinopril (PRINIVIL,ZESTRIL) 10 MG tablet TAKE 1 TABLET BY MOUTH EVERY DAY 07/18/14   Ria Bush, MD  loratadine (CLARITIN) 10 MG tablet TAKE 1 TABLET BY MOUTH EVERY DAY 04/15/14   Ria Bush, MD  Melatonin 10 MG CAPS Take 1 capsule by mouth at bedtime as needed.    Historical Provider, MD  Multiple Vitamin (MULTIVITAMIN) tablet Take 1 tablet by mouth daily.    Historical Provider, MD  Omega-3 Fatty Acids (FISH OIL) 1000 MG CAPS Take 1,000 mg by mouth daily.    Historical Provider, MD  pantoprazole (PROTONIX) 40 MG tablet TAKE 1 TABLET EVERY DAY 12/08/13   Ria Bush, MD  sertraline (ZOLOFT) 25 MG tablet TAKE 1 TABLET (25 MG TOTAL) BY MOUTH DAILY. 07/29/14   Ria Bush, MD   Physical Exam: Filed Vitals:   08/16/14 2045  BP: 163/72  Pulse: 59  Temp:   Resp: 13    BP 163/72 mmHg  Pulse 59  Temp(Src) 98.3 F (36.8 C) (Oral)  Resp 13  Wt 76.658 kg (169 lb)  SpO2 98%  General Appearance:    Alert, oriented, no distress, appears stated age  Head:    Normocephalic, atraumatic  Eyes:    PERRL, EOMI, sclera non-icteric        Nose:   Nares without drainage or epistaxis. Mucosa, turbinates normal  Throat:   Moist mucous membranes. Oropharynx without erythema or exudate.  Neck:   Supple. No carotid bruits.  No thyromegaly.  No lymphadenopathy.   Back:     No CVA tenderness, no spinal tenderness  Lungs:     Clear to auscultation bilaterally, without wheezes, rhonchi or rales  Chest wall:    No tenderness to palpitation  Heart:    Regular rate and rhythm without murmurs, gallops, rubs  Abdomen:     Soft, non-tender, nondistended, normal bowel sounds, no organomegaly  Genitalia:    deferred  Rectal:    deferred  Extremities:   No clubbing, cyanosis or edema.  Pulses:   2+ and symmetric all extremities  Skin:   Skin color, texture, turgor normal, no rashes or lesions  Lymph nodes:    Cervical, supraclavicular, and axillary nodes normal  Neurologic:   Expressive aphasia has improved somewhat at this time, has been waxing and waning in severity since arrival to the ED.    Labs on Admission:  Basic Metabolic Panel:  Recent Labs Lab 08/16/14 1911  NA 139  K 3.7  CL 104  CO2 27  GLUCOSE 150*  BUN 14  CREATININE 1.20  CALCIUM 9.5   Liver Function Tests:  Recent Labs Lab  08/16/14 1911  AST 21  ALT 13  ALKPHOS 55  BILITOT 0.8  PROT 7.4  ALBUMIN 3.7   No results for input(s): LIPASE, AMYLASE in the last 168 hours. No results for input(s): AMMONIA in the last 168 hours. CBC:  Recent Labs Lab 08/16/14 1911  WBC 9.8  NEUTROABS 8.5*  HGB 14.1  HCT 41.9  MCV 89.7  PLT 191   Cardiac Enzymes: No results for input(s): CKTOTAL, CKMB, CKMBINDEX, TROPONINI in the last 168 hours.  BNP (last 3 results) No results for input(s): PROBNP in the last 8760 hours. CBG: No results for input(s): GLUCAP in the last 168 hours.  Radiological Exams on Admission: Ct Head Wo Contrast  08/16/2014   CLINICAL DATA:  Could stroke, right facial droop  EXAM: CT HEAD WITHOUT CONTRAST  TECHNIQUE: Contiguous axial images were obtained from the base of the skull through the vertex without intravenous contrast.  COMPARISON:  Head CT 06/18/2013  FINDINGS: There is new hypodensity within the medial inferior left temporal lobe (image 14, series 201). No evidence of intracranial hemorrhage. There is generalized cortical atrophy. There is periventricular white matter hypodensities not changed from prior. Paranasal sinuses and mastoid air cells are clear. There is polypoid mucosal thickening in the right maxillary sinus.  IMPRESSION: 1. Acute versus subacute infarction in the medial aspect of the left temporal lobe operculum. 2. No intracranial hemorrhage identified. 3. Atrophy and microvascular disease again demonstrated. [facility] 08/16/2014  at19:40.   Electronically  Signed   By: Suzy Bouchard M.D.   On: 08/16/2014 19:41    EKG: Independently reviewed.  Assessment/Plan Principal Problem:   Acute ischemic left MCA stroke Active Problems:   HLD (hyperlipidemia)   Essential hypertension   CVA (cerebral infarction)   1. Acute ischemic left temporal lobe stroke - 1. Stroke pathway and work up 2. On plavix already 3. PT/OT/SLP 4. MRI/MRA, carotid doplers, 2d echo 2. HTN - hold lisinopril in setting of acute ischemic stroke for permissive HTN. 3. HLD - continue statin    Code Status: Full Code  Family Communication: Family at bedside Disposition Plan: Admit to inpatient   Time spent: 70 min  Kenniyah Sasaki M. [facility] Pager [phone]  If 7AM-7PM, please contact the day team taking care of the patient [email or web address] Password [email or web address] 08/16/2014, 9:02 PM

## 2014-08-16 NOTE — ED Notes (Signed)
Pt unable to give urine sample at this time 

## 2014-08-16 NOTE — ED Provider Notes (Signed)
CSN: [id number]     Arrival date & time 08/16/14  1831 History   First MD Initiated Contact with Patient 08/16/14 1831     Chief Complaint  Patient presents with  . Stroke Symptoms    @EDPCLEARED @ (Consider location/radiation/quality/duration/timing/severity/associated sxs/prior Treatment) The history is provided by the patient and a relative.   level V caveat due to altered mental status Patient presented with altered mental status. Initially last seen normal at 10:30. Then much more confusion and difficulty speaking when seen by family prior to arrival. Upon arrival to the ED patient  Past Medical History  Diagnosis Date  . GERD (gastroesophageal reflux disease) 08/1996    with large HH and partially intrathoracic stomach  . Hyperlipemia 08/1986  . Hypertension Pre 1996  . Osteoporosis 2006    dexa T -2.8 (05/2012), T11 compression fx  . Pneumonia 43 yoa  . History of Doppler echocardiogram 05/1995    MVP, MOD AI, MILD TR, MILD MR  . Elevated PSA     decided against further eval  . Diverticulosis   . Bilateral sensorineural hearing loss 02/2012    some conductive left side  . Compression fracture     multiple  . AV block, 2nd degree 04/2012    seen cards, monitor for now, pt does not desire monitor/pacer  . BCC (basal cell carcinoma of skin) 08/2012    s/p excision Allyson Sabal)  . HOH (hard of hearing)   . Depression   . Chronic kidney disease     elevated PSA-no tx  . CVA (cerebral infarction) 04/20/2012    04/16/12 IMPRESSION:  1. 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 No mass effect or hemorrhage. Basilar artery perforator territory.  2. See MRA findings below.  3. Otherwise stable MRI appearance of the brain since 2011 including extensive perivascular spaces (etat crible appearance)  . Fall at home 06/12/2013    multiple rib fractures; traumatic hemopneumothorax/notes 06/12/2013 (06/14/2013)  . Hiatal hernia 04/2014    mod-large by CXR   Past Surgical  History  Procedure Laterality Date  . Cholecystectomy  05/1995  . Exercise treadmill  09/12/2006    NML  . Esophagogastroduodenoscopy  08/18/2004    H. H. Dr. Fuller Plan  . Colonoscopy  05/01/2007    divericulosis, int hemorrhoids - Dr. Fuller Plan  . Dexa  05/2012    T score -2.8 hip, T11 compression fx  . Skin full thickness graft Left 10/19/2012    Procedure: REPAIR OF LEFT EAR WITH TISSUE REARRANGEMENT SKIN GRAFT FULL THICKNESS;  Surgeon: Theodoro Kos, DO;  Location: [facility];  Service: Plastics;  Laterality: Left;  . US echocardiography  05/2013    Mod LVH, EF 60-65%, indeterminate diastolic fxn, aortic sclerosis without stenosis, mildly dilated RV with nl sys fxn, mod dilated arch  . Inguinal hernia repair Left 08/14/2010    Dr. Hassell Done  . Cataract extraction w/ intraocular lens  implant, bilateral Bilateral    Family History  Problem Relation Age of Onset  . Heart disease Mother     MI  . Hypertension Mother   . Stroke Mother     ?  . Stroke Father   . Diabetes Sister   . Hypertension Sister   . Hypertension Sister   . Hypertension Sister   . Stroke Sister     recurrent mini strokes  . Cancer Neg Hx    History  Substance Use Topics  . Smoking status: Never Smoker   . Smokeless tobacco:  Never Used  . Alcohol Use: No    Review of Systems  Unable to perform ROS     Allergies  Review of patient's allergies indicates no known allergies.  Home Medications   Prior to Admission medications   Medication Sig Start Date End Date Taking? Authorizing Provider  acetaminophen (TYLENOL) 500 MG tablet Take 500 mg by mouth daily as needed (pain).   Yes Historical Provider, MD  atorvastatin (LIPITOR) 10 MG tablet TAKE 1 TABLET (10 MG TOTAL) BY MOUTH DAILY. 05/27/14  Yes Ria Bush, MD  cholecalciferol (VITAMIN D) 1000 UNITS tablet Take 1,000 Units by mouth at bedtime.   Yes Historical Provider, MD  clopidogrel (PLAVIX) 75 MG tablet TAKE 1 TABLET EVERY DAY  07/18/14  Yes Ria Bush, MD  furosemide (LASIX) 20 MG tablet TAKE 1 TABLET EVERY DAY 04/30/14  Yes Ria Bush, MD  lisinopril (PRINIVIL,ZESTRIL) 10 MG tablet TAKE 1 TABLET BY MOUTH EVERY DAY 07/18/14  Yes Ria Bush, MD  Melatonin 10 MG CAPS Take 10 mg by mouth at bedtime.    Yes Historical Provider, MD  Multiple Vitamin (MULTIVITAMIN WITH MINERALS) TABS tablet Take 1 tablet by mouth daily.   Yes Historical Provider, MD  Omega-3 Fatty Acids (FISH OIL) 1000 MG CAPS Take 1,000 mg by mouth at bedtime.    Yes Historical Provider, MD  pantoprazole (PROTONIX) 40 MG tablet TAKE 1 TABLET EVERY DAY 12/08/13  Yes Ria Bush, MD  Polyethyl Glycol-Propyl Glycol (SYSTANE OP) Place 1 drop into both eyes daily.   Yes Historical Provider, MD  potassium chloride SA (K-DUR,KLOR-CON) 20 MEQ tablet Take 20 mEq by mouth at bedtime.   Yes Historical Provider, MD  sertraline (ZOLOFT) 25 MG tablet Take 25 mg by mouth at bedtime.   Yes Historical Provider, MD   BP 146/80 mmHg  Pulse 59  Temp(Src) 98.3 F (36.8 C) (Oral)  Resp 18  Wt 169 lb (76.658 kg)  SpO2 98% Physical Exam  Constitutional: He appears well-developed and well-nourished.  Eyes: Pupils are equal, round, and reactive to light.  Neck: Neck supple.  Cardiovascular: Normal rate and regular rhythm.   Pulmonary/Chest: Effort normal.  Abdominal: Soft. There is no tenderness.  Neurological: He is alert.  Some receptive aphasia. Will move all extremities. Appears to have difficulty understanding also. Difficulty speaking. Complete NIH score done by neurology. Patient will answer some questions but will often answer with the wrong words.  Skin: Skin is warm.    ED Course  Procedures (including critical care time) Labs Review Labs Reviewed  DIFFERENTIAL - Abnormal; Notable for the following:    Neutrophils Relative % 87 (*)    Neutro Abs 8.5 (*)    Lymphocytes Relative 9 (*)    All other components within normal limits   COMPREHENSIVE METABOLIC PANEL - Abnormal; Notable for the following:    Glucose, Bld 150 (*)    GFR calc non Af Amer 52 (*)    GFR calc Af Amer 60 (*)    All other components within normal limits  URINALYSIS, ROUTINE W REFLEX MICROSCOPIC - Abnormal; Notable for the following:    Protein, ur 30 (*)    All other components within normal limits  I-STAT CHEM 8, ED - Abnormal; Notable for the following:    Glucose, Bld 147 (*)    All other components within normal limits  ETHANOL  PROTIME-INR  APTT  CBC  URINE RAPID DRUG SCREEN (HOSP PERFORMED)  URINE MICROSCOPIC-ADD ON  HEMOGLOBIN A1C  LIPID PANEL  Randolm Idol, ED  I-STAT TROPOININ, ED    Imaging Review Ct Head Wo Contrast  08/16/2014   CLINICAL DATA:  Could stroke, right facial droop  EXAM: CT HEAD WITHOUT CONTRAST  TECHNIQUE: Contiguous axial images were obtained from the base of the skull through the vertex without intravenous contrast.  COMPARISON:  Head CT 06/18/2013  FINDINGS: There is new hypodensity within the medial inferior left temporal lobe (image 14, series 201). No evidence of intracranial hemorrhage. There is generalized cortical atrophy. There is periventricular white matter hypodensities not changed from prior. Paranasal sinuses and mastoid air cells are clear. There is polypoid mucosal thickening in the right maxillary sinus.  IMPRESSION: 1. Acute versus subacute infarction in the medial aspect of the left temporal lobe operculum. 2. No intracranial hemorrhage identified. 3. Atrophy and microvascular disease again demonstrated. [facility] 08/16/2014  at19:40.   Electronically Signed   By: Suzy Bouchard M.D.   On: 08/16/2014 19:41     EKG Interpretation   Date/Time:  Friday August 16 2014 18:41:05 EST Ventricular Rate:  69 PR Interval:  314 QRS Duration: 101 QT Interval:  567 QTC Calculation: 608 R Axis:   30 Text Interpretation:  Sinus rhythm Sinus pause Prolonged PR interval   Borderline T wave abnormalities Prolonged QT interval Confirmed by  Alvino Chapel  MD, Ovid Curd [phone]) on 08/16/2014 11:54:22 PM      MDM   Final diagnoses:  Stroke    Patient presented with altered mental status. Initially last normal at 10 AM, but did have a more normal period Witnessed by a nurse while in the ED. May have gone back to his baseline. Code stroke was called because this may have reset the clock. Head CT done and shows acute or subacute infarct. Neurology believes this is likely an infarct from earlier today at the most recent. Not a TPA candidate due to time of onset. Will admit to internal medicine.  CRITICAL CARE Performed by: Mackie Pai Total critical care time: 30 Critical care time was exclusive of separately billable procedures and treating other patients. Critical care was necessary to treat or prevent imminent or life-threatening deterioration. Critical care was time spent personally by me on the following activities: development of treatment plan with patient and/or surrogate as well as nursing, discussions with consultants, evaluation of patient's response to treatment, examination of patient, obtaining history from patient or surrogate, ordering and performing treatments and interventions, ordering and review of laboratory studies, ordering and review of radiographic studies, pulse oximetry and re-evaluation of patient's condition.    Jasper Riling. Alvino Chapel, MD 08/16/14 [id number]

## 2014-08-16 NOTE — ED Notes (Signed)
Attempted report 

## 2014-08-16 NOTE — Progress Notes (Signed)
Pt admitted from ED accompanied by daughter,alert, denies any pain but seems confused with garbled speech, pt settled in bed, telemetry box put on pt,v/s stable, will however continue to monitor, call light at bedside.Edmonia Caprio, Marlin Jarrard Efe

## 2014-08-17 ENCOUNTER — Inpatient Hospital Stay (HOSPITAL_COMMUNITY): Payer: Medicare Other

## 2014-08-17 ENCOUNTER — Encounter (HOSPITAL_COMMUNITY): Payer: Self-pay | Admitting: Radiology

## 2014-08-17 DIAGNOSIS — I63512 Cerebral infarction due to unspecified occlusion or stenosis of left middle cerebral artery: Secondary | ICD-10-CM

## 2014-08-17 LAB — LIPID PANEL
CHOLESTEROL: 135 mg/dL (ref 0–200)
HDL: 37 mg/dL — AB (ref >39)
LDL Cholesterol: 81 mg/dL (ref 0–99)
TRIGLYCERIDES: 86 mg/dL (ref <150)
Total CHOL/HDL Ratio: 3.6 RATIO
VLDL: 17 mg/dL (ref 0–40)

## 2014-08-17 LAB — HEMOGLOBIN A1C
Hgb A1c MFr Bld: 5.8 % — ABNORMAL HIGH (ref <5.7)
Mean Plasma Glucose: 120 mg/dL — ABNORMAL HIGH (ref <117)

## 2014-08-17 MED ORDER — IOHEXOL 300 MG/ML  SOLN
50.0000 mL | Freq: Once | INTRAMUSCULAR | Status: AC | PRN
  Administered 2014-08-17: 50 mL via ORAL

## 2014-08-17 MED ORDER — LEVETIRACETAM 500 MG PO TABS
500.0000 mg | ORAL_TABLET | Freq: Two times a day (BID) | ORAL | Status: DC
  Administered 2014-08-17 – 2014-08-20 (×7): 500 mg via ORAL
  Filled 2014-08-17 (×7): qty 1

## 2014-08-17 MED ORDER — DEXAMETHASONE 4 MG PO TABS
4.0000 mg | ORAL_TABLET | Freq: Four times a day (QID) | ORAL | Status: DC
  Administered 2014-08-17 – 2014-08-19 (×9): 4 mg via ORAL
  Filled 2014-08-17 (×9): qty 1

## 2014-08-17 MED ORDER — IOHEXOL 300 MG/ML  SOLN
100.0000 mL | Freq: Once | INTRAMUSCULAR | Status: AC | PRN
  Administered 2014-08-17: 100 mL via INTRAVENOUS

## 2014-08-17 MED ORDER — GADOBENATE DIMEGLUMINE 529 MG/ML IV SOLN
15.0000 mL | Freq: Once | INTRAVENOUS | Status: AC | PRN
  Administered 2014-08-17: 15 mL via INTRAVENOUS

## 2014-08-17 NOTE — Evaluation (Signed)
Occupational Therapy Evaluation Patient Details Name: Christopher [name] MRN: [id number] DOB: 07/25/1924 Today's Date: 08/17/2014    History of Present Illness 81 yo male with L MCA CVA on 08/16/13.  Facial droop and garbled speech.  Hx of prior CVA 2011 and hx of falls.   Clinical Impression   Pt was living alone prior to admission.  His family provided intermittent supervision and assist for transportation.  Pt received mobile meals.  He was able to perform light meal prep, bathing, dressing and toileting at a modified independent level. Pt has baseline cognitive deficits and impaired safety per son with hx of falls. Recommended pt use his rollator or RW in the home rather than using furniture/walls. Pt has all necessary DME. He gets fatigued with LB dressing.  Will follow acutely.    Follow Up Recommendations  Home health OT;Supervision/Assistance - 24 hour (initially)    Equipment Recommendations  None recommended by OT    Recommendations for Other Services       Precautions / Restrictions Precautions Precautions: Fall Restrictions Weight Bearing Restrictions: No      Mobility Bed Mobility     General bed mobility comments: pt up in chair  Transfers Overall transfer level: Needs assistance Equipment used: Rolling walker (2 wheeled) Transfers: Sit to/from [facility] Sit to Stand: Min guard Stand pivot transfers: Min guard       General transfer comment: assist for safety    Balance Overall balance assessment: Needs assistance Sitting-balance support: No upper extremity supported Sitting balance-Leahy Scale: Good     Standing balance support: Bilateral upper extremity supported Standing balance-Leahy Scale: Fair       Tandem Stance - Right Leg:  (2 seconds, fall to Right)         High Level Balance Comments: stand eyes closed, feet together 10 seconds with Supervision            ADL Overall ADL's : Needs  assistance/impaired Eating/Feeding: Independent   Grooming: Wash/dry hands;Supervision/safety;Standing   Upper Body Bathing: Supervision/ safety;Sitting   Lower Body Bathing: Sit to/from stand;Min guard   Upper Body Dressing : Set up;Sitting   Lower Body Dressing: Minimal assistance;Sit to/from stand;Sitting/lateral leans Lower Body Dressing Details (indicate cue type and reason): min assist for socks, fatigues, has a sock aid but does not use Toilet Transfer: Min guard;RW;Ambulation;Comfort height toilet Toilet Transfer Details (indicate cue type and reason): keeps 3 in1 over toilet at home [place] and Hygiene: Supervision/safety;Sit to/from stand       Functional mobility during ADLs: Supervision/safety;Rolling walker       Vision                     Perception     Praxis      Pertinent Vitals/Pain Pain Assessment: No/denies pain     Hand Dominance Right   Extremity/Trunk Assessment Upper Extremity Assessment Upper Extremity Assessment: Overall WFL for tasks assessed   Lower Extremity Assessment Lower Extremity Assessment: Defer to PT evaluation   Cervical / Trunk Assessment Cervical / Trunk Assessment: Kyphotic   Communication Communication Communication: HOH   Cognition Arousal/Alertness: Awake/alert Behavior During Therapy: Impulsive Overall Cognitive Status: History of cognitive impairments - at baseline Area of Impairment: Safety/judgement;Memory     Memory: Decreased short-term memory   Safety/Judgement: Decreased awareness of deficits;Decreased awareness of safety     General Comments: at baseline per son   General Comments       Exercises  Shoulder Instructions      Home Living Family/patient expects to be discharged to:: Private residence Living Arrangements: Alone Available Help at Discharge: Family;Available PRN/intermittently Type of Home: House Home Access: Level entry     Home Layout:  One level     Bathroom Shower/Tub: Teacher, early years/pre: Standard     Home Equipment: Environmental consultant - 2 wheels;Walker - 4 wheels;Cane - quad;Hospital bed;Grab bars - tub/shower;Hand held shower head;Adaptive equipment;Shower seat;Bedside Art therapist: Reacher;Sock aid Additional Comments: Family checks on patient 1-2 x daily      Prior Functioning/Environment Level of Independence: Independent with assistive device(s)             OT Diagnosis: Cognitive deficits;Generalized weakness   OT Problem List: Impaired balance (sitting and/or standing);Decreased cognition;Decreased safety awareness;Decreased knowledge of use of DME or AE   OT Treatment/Interventions: Self-care/ADL training;Therapeutic activities;Patient/family education;DME and/or AE instruction    OT Goals(Current goals can be found in the care plan section) Acute Rehab OT Goals Patient Stated Goal: Return home OT Goal Formulation: With patient Time For Goal Achievement: 08/24/14 Potential to Achieve Goals: Good ADL Goals Pt Will Perform Lower Body Bathing: sit to/from stand;with modified independence Pt Will Perform Lower Body Dressing: sit to/from stand;with adaptive equipment;with modified independence Pt Will Transfer to Toilet: ambulating;bedside commode;with modified independence (over toilet) Pt Will Perform Toileting - Clothing Manipulation and hygiene: sit to/from stand;with modified independence Pt Will Perform Tub/Shower Transfer: Tub transfer;with supervision;ambulating;shower seat;grab bars  OT Frequency: Min 2X/week   Barriers to D/C:            Co-evaluation              End of Session Equipment Utilized During Treatment: Rolling walker;Gait belt  Activity Tolerance: Patient tolerated treatment well Patient left: in chair;with call bell/phone within reach;with family/visitor present   Time: 1435-1457 OT Time Calculation (min): 22 min Charges:  OT General  Charges $OT Visit: 1 Procedure OT Evaluation $Initial OT Evaluation Tier I: 1 Procedure OT Treatments $Self Care/Home Management : 8-22 mins G-Codes:    Christopher [name] 08/17/2014, 3:00 PM  [phone]

## 2014-08-17 NOTE — Progress Notes (Signed)
Physical Therapy Evaluation Patient Details Name: Christopher [name] MRN: [id number] DOB: October 17, 1923 Today's Date: 08/17/2014   History of Present Illness  81 yo male with L MCA CVA on 08/16/13.  Facial droop and garbled speech.  Hx of prior CVA 2011 and hx of falls.  Clinical Impression  Patient is pleasant, agreeable to therapy evaluation, reports he is doing much better than yesterday, but still not quite right.  Patient quick to perform movements once initial cues given, but not always waiting for safety considerations.  May be partly due to hard of hearing, but also demonstrates decreased safety insight, and decreased peripheral awareness.  Only mild physical assist required, increased safety cues and high level balance assist required.  Patient will benefit from continuation of skilled PT services while in hospital, for balance and transfer independence. At this time recommending 24 hour assist for safety.    Follow Up Recommendations Home health PT;Supervision/Assistance - 24 hour    Equipment Recommendations  None recommended by PT    Recommendations for Other Services       Precautions / Restrictions Precautions Precautions: Fall Restrictions Weight Bearing Restrictions: No      Mobility  Bed Mobility Overal bed mobility: Needs Assistance Bed Mobility: Supine to Sit     Supine to sit: Min guard     General bed mobility comments: impulsive  Transfers Overall transfer level: Needs assistance Equipment used: Rolling walker (2 wheeled) Transfers: Sit to/from [facility] Sit to Stand: Min guard Stand pivot transfers: Min guard       General transfer comment: decreased safety, impulsive  Ambulation/Gait Ambulation/Gait assistance: Supervision Ambulation Distance (Feet): 150 Feet Assistive device: Rolling walker (2 wheeled) Gait Pattern/deviations: Step-through pattern;Decreased step length - right;Decreased step length - left;Drifts right/left;Trunk  flexed;Narrow base of support   Gait velocity interpretation: Below normal speed for age/gender General Gait Details: Drifts to L side, decreased peripheral awareness  Stairs            Wheelchair Mobility    Modified Rankin (Stroke Patients Only) Modified Rankin (Stroke Patients Only) Pre-Morbid Rankin Score: No significant disability Modified Rankin: Moderate disability     Balance Overall balance assessment: Needs assistance Sitting-balance support: No upper extremity supported Sitting balance-Leahy Scale: Good     Standing balance support: Bilateral upper extremity supported Standing balance-Leahy Scale: Fair       Tandem Stance - Right Leg:  (2 seconds, fall to Right)         High Level Balance Comments: stand eyes closed, feet together 10 seconds with Supervision             Pertinent Vitals/Pain Pain Assessment: No/denies pain    Home Living Family/patient expects to be discharged to:: Private residence Living Arrangements: Alone Available Help at Discharge: Family;Available PRN/intermittently Type of Home: House Home Access: Level entry     Home Layout: One level Home Equipment: Walker - 2 wheels;Walker - 4 wheels Additional Comments: Family checks on patient 1-2 x daily    Prior Function Level of Independence: Independent with assistive device(s)               Hand Dominance        Extremity/Trunk Assessment   Upper Extremity Assessment: Overall WFL for tasks assessed;Defer to OT evaluation           Lower Extremity Assessment: Overall WFL for tasks assessed      Cervical / Trunk Assessment: Kyphotic  Communication   Communication: Expressive difficulties;HOH  Cognition  Arousal/Alertness: Awake/alert Behavior During Therapy: Impulsive Overall Cognitive Status: Impaired/Different from baseline Area of Impairment: Safety/judgement;Memory         Safety/Judgement: Decreased awareness of deficits     General  Comments: Frequent safety cues and re-cues required, impulsive.    General Comments General comments (skin integrity, edema, etc.): Coordination grossly intact UE and LE, heel to shin, toe tapping, alternating finger to thumb     Exercises        Assessment/Plan    PT Assessment Patient needs continued PT services  PT Diagnosis Difficulty walking;Abnormality of gait;Altered mental status   PT Problem List Decreased balance;Decreased mobility;Decreased safety awareness  PT Treatment Interventions Gait training;Functional mobility training;Balance training;Therapeutic activities   PT Goals (Current goals can be found in the Care Plan section) Acute Rehab PT Goals Patient Stated Goal: None stated, plan to return home again. PT Goal Formulation: With patient Time For Goal Achievement: 08/31/14 Potential to Achieve Goals: Good    Frequency Min 4X/week   Barriers to discharge Decreased caregiver support  (Family currently providing only PRN assist)    Co-evaluation               End of Session Equipment Utilized During Treatment: Gait belt Activity Tolerance: Patient tolerated treatment well Patient left: in chair;with call bell/phone within reach;with chair alarm set Nurse Communication: Mobility status         Time: 1250-1345 PT Time Calculation (min) (ACUTE ONLY): 55 min   Charges:   PT Evaluation $Initial PT Evaluation Tier I: 1 Procedure PT Treatments $Gait Training: 8-22 mins $Neuromuscular Re-education: 8-22 mins   PT G Codes:        Krrish Freund L 2014/08/27, 2:16 PM

## 2014-08-17 NOTE — Progress Notes (Signed)
STROKE TEAM PROGRESS NOTE   HISTORY Christopher [name] is an 81 y.o. male with a history of stroke who lives alone and at baseline ambulates with a Rolator. Spoke to his daughter this morning and was at baseline. When his son called around mid-day he did not answer Christopher phone. When he called again later in Christopher afternoon, again he did not answer Christopher phone. He went to check on his father at that time and his son found Christopher [name] on Christopher side of Christopher bed "incoherent". EMS was called at that time and Christopher [name] was brought in for evaluation. It seemed that Christopher [name] had improvement in his speech on arrival but shortly again became unable to be understood. Code stroke was called at that time.   Date last known well: Date: 08/16/2014 Time last known well: Time: 10:30 tPA Given: No: Outside time window     SUBJECTIVE (INTERVAL HISTORY) Daughter at Christopher bedside. She feels that Christopher [name]'s speech is somewhat better today. Dr. Leonie Man discussed Christopher MRI findings.   OBJECTIVE Temp:  [98.2 F (36.8 C)-99.5 F (37.5 C)] 99.5 F (37.5 C) (01/09 0756) Pulse Rate:  [37-81] 55 (01/09 0756) Cardiac Rhythm:  [-] Heart block (01/09 0600) Resp:  [13-19] 16 (01/09 0756) BP: (103-205)/(62-110) 141/70 mmHg (01/09 0756) SpO2:  [93 %-99 %] 95 % (01/09 0756) Weight:  [169 lb (76.658 kg)-173 lb 12.8 oz (78.835 kg)] 173 lb 12.8 oz (78.835 kg) (01/08 2302)  No results for input(s): GLUCAP in Christopher last 168 hours.  Recent Labs Lab 08/16/14 1911 08/16/14 2102  NA 139 141  K 3.7 3.7  CL 104 104  CO2 27  --   GLUCOSE 150* 147*  BUN 14 17  CREATININE 1.20 1.20  CALCIUM 9.5  --     Recent Labs Lab 08/16/14 1911  AST 21  ALT 13  ALKPHOS 55  BILITOT 0.8  PROT 7.4  ALBUMIN 3.7    Recent Labs Lab 08/16/14 1911 08/16/14 2102  WBC 9.8  --   NEUTROABS 8.5*  --   HGB 14.1 15.3  HCT 41.9 45.0  MCV 89.7  --   PLT 191  --    No results for input(s): CKTOTAL, CKMB, CKMBINDEX, TROPONINI in Christopher last  168 hours.  Recent Labs  08/16/14 1911  LABPROT 13.6  INR 1.03    Recent Labs  08/16/14 2035  COLORURINE YELLOW  LABSPEC 1.019  PHURINE 6.0  GLUCOSEU NEGATIVE  HGBUR NEGATIVE  BILIRUBINUR NEGATIVE  KETONESUR NEGATIVE  PROTEINUR 30*  UROBILINOGEN 0.2  NITRITE NEGATIVE  LEUKOCYTESUR NEGATIVE       Component Value Date/Time   CHOL 135 08/17/2014 0530   TRIG 86 08/17/2014 0530   HDL 37* 08/17/2014 0530   CHOLHDL 3.6 08/17/2014 0530   VLDL 17 08/17/2014 0530   LDLCALC 81 08/17/2014 0530   Lab Results  Component Value Date   HGBA1C 5.9 04/16/2014      Component Value Date/Time   LABOPIA NONE DETECTED 08/16/2014 2035   COCAINSCRNUR NONE DETECTED 08/16/2014 2035   LABBENZ NONE DETECTED 08/16/2014 2035   AMPHETMU NONE DETECTED 08/16/2014 2035   THCU NONE DETECTED 08/16/2014 2035   LABBARB NONE DETECTED 08/16/2014 2035     Recent Labs Lab 08/16/14 1911  ETH <5    Ct Head Wo Contrast 08/16/2014    1. Acute versus subacute infarction in Christopher medial aspect of Christopher left temporal lobe operculum.  2. No intracranial hemorrhage identified. 3. Atrophy and microvascular disease  again demonstrated.     Mr Jeri Cos Wo Contrast 08/17/2014    MRI HEAD:  No acute ischemia.  9 x 18 mm LEFT temporal solid enhancing mass, LEFT mesial occipital lobe 14 x 20 mm cystic enhancing mass, punctate of additional enhancement within Christopher LEFT occipital lobe, with surrounding nonenhancing mildly expansile FLAIR T2 hyperintense signal. 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 This could reflect metastasis, though metastasis typically have more mass effect.  Similar basal ganglia and thalamus perivascular spaces and/or remote lacunar infarcts. Mild white matter changes suggest chronic small vessel ischemic disease, similar.    MRA HEAD:  Normal MRA of Christopher intracranial vessels.        PHYSICAL  EXAM Pleasant elderly Caucasian male not in distress.Awake alert. Afebrile. Head is nontraumatic. Neck is supple without bruit. Hearing is normal. Cardiac exam no murmur or gallop. Lungs are clear to auscultation. Distal pulses are well felt. Neurological Exam ;  Awake  Alert oriented x 3. Normal speech and language.eye movements full without nystagmus diminished attention and recall.Fluent speech..fundi were not visualized. Vision acuity and fields appear normal. Hearing is normal. Palatal movements are normal. Face symmetric. Tongue midline. Normal strength, tone, reflexes and coordination. Normal sensation. Gait deferred.    ASSESSMENT/PLAN Christopher [name] is a 81 y.o. male with history of hyperlipidemia, hypertension, previous CVA, previously elevated PSA, and basal cell carcinoma of Christopher skin presenting with confusion and speech difficulties likely from unwitnessed seizure with post ictal confusion now resolved.Marland Kitchen  He did not receive IV t-PA secondary to late presentation.  MRI consistent with multiple enhancing mass lesions as noted above. No acute stroke.  Resultant confusion and speech difficulties  MRI see above  MRA  see above  Carotid Doppler not ordered  2D Echo not ordered  LDL 81  HgbA1c pending  Subcutaneous heparin for VTE prophylaxis  Dysphagia 3 diet with thin liquids  clopidogrel 75 mg orally every day prior to admission, now on no antithrombotic  Therapy recommendations: Pending  Disposition: Pending  Hypertension  Home meds:  Zestril 10 mg daily  Stable  Hyperlipidemia  Home meds: Lipitor 10 mg daily resumed in hospital  LDL 81, goal < 70  Continue statin at discharge   Other Stroke Risk Factors  Advanced age  Hx stroke/TIA  Family hx stroke (both parents)   Other Active Problems  Suspected seizure activity - Keppra started  Brain enhancing mass lesions - Decadron added - oncology consult planned.  Elevated glucose levels -  monitor on Decadron  General neurology to follow [name] starting tomorrow.   Other Pertinent History  Previously elevated PSA  Basal cell carcinoma of Christopher skin    Hospital day # Tees Toh PA-C [facility] Pager [phone] 08/17/2014, 9:54 AM I have personally examined this [name], reviewed notes, independently viewed imaging studies, participated in medical decision making and plan of care. I have made any additions or clarifications directly to Christopher above note. Agree with note above.I think he has brain metastases probably from skin cancer but will need search for primary. No need to continue stroke w/u. D/W [name], family and Dr Emeline Gins and answered questions. Recommend keppra 500 mg twice daily for seizures and steroids for vasogenic cerebral edema.Stroke team will sign off and Dr Janann Colonel neurohospitalist will follow from neurological [place] Antony Contras, MD Medical Director [place] Pager: [phone] 08/17/2014 8:25 PM    To  contact Stroke Continuity provider, please refer to [email or web address] After hours, contact General Neurology

## 2014-08-17 NOTE — Progress Notes (Signed)
Patient Demographics  Christopher [name], is a 81 y.o. male, DOB - 03-11-1924, VOZ:[id number]  Admit date - 08/16/2014   Admitting Physician Etta Quill, DO  Outpatient Primary MD for the patient is Ria Bush, MD  LOS - 1   Chief Complaint  Patient presents with  . Stroke Symptoms      Admission history of present illness/brief narrative: Christopher [name] is a 81 y.o. male with h/o stroke, lives alone and ambulates at baseline. LKW at 10:30 when he spoke with daughter this morning. When son called around mid day did not answer phone. Called again later this afternoon, no answer. When they went to check on him found patient on side of bed "incoherent". EMS called and patient brought in for evaluation. Stroke code was called, and patient had CT head showing acute versus subacute infarction of the medial aspect of the left temporal, thought initially to be a stroke, but MRI was done which did show left temporal enhancing solid mass and left occipital lobe enhancing cystic mass, suspicion for multifocal glioblastoma, normal MRA of intracranial vessels.  Subjective:   Rush Farmer today has, No headache, No chest pain, No abdominal pain - No Nausea, No new weakness tingling or numbness, No Cough - SOB.  Assessment & Plan    Principal Problem:   Acute ischemic left MCA stroke Active Problems:   HLD (hyperlipidemia)   Essential hypertension   CVA (cerebral infarction)  Abnormal neurological finding with slurred speech - This is most likely related to brain tumor, suspicion for multifocal glioblastoma by radiology reading, as discussed with neurology, there is a possibility of brain metastases, especially with known history of melanoma with resection before 2 years. - Start on [place] for seizure prophylaxis - Start on Decadron secondary to brain tumor - We will  check CT chest/abdomen/pelvis with IV contrast to evaluate for any primary source of tumor or metastasis. - PT/OT/SLP consult. - We will discontinue Plavix for possible needed for intervention  Hypertension - Resume on lisinopril  Hyperlipidemia - Continue with statin  History of CVA - Will hold Plavix currently, as very likely will need interventional  Code Status: Full    Family Communication: family at bedside.  Disposition Plan: home   Procedures     Consults   neurology   Medications  Scheduled Meds: . atorvastatin  10 mg Oral q1800  . cholecalciferol  1,000 Units Oral QHS  . clopidogrel  75 mg Oral Daily  . dexamethasone  4 mg Oral 4 times per day  . furosemide  20 mg Oral Daily  . heparin  5,000 Units Subcutaneous 3 times per day  . levETIRAcetam  500 mg Oral BID  . multivitamin with minerals  1 tablet Oral Daily  . pantoprazole  40 mg Oral Daily  . potassium chloride SA  20 mEq Oral QHS  . sertraline  25 mg Oral QHS   Continuous Infusions:  PRN Meds:.acetaminophen, Melatonin, polyvinyl alcohol  DVT Prophylaxis - Heparin -   Lab Results  Component Value Date   PLT 191 08/16/2014    Antibiotics   Anti-infectives    None          Objective:   Filed Vitals:   08/17/14 0600 08/17/14  0756 08/17/14 1000 08/17/14 1210  BP: 148/72 141/70 149/76 128/83  Pulse: 63 55 61 62  Temp: 98.2 F (36.8 C) 99.5 F (37.5 C) 98.6 F (37 C) 98.6 F (37 C)  TempSrc: Oral Oral Oral Oral  Resp: 16 16 20 16   Height:      Weight:      SpO2: 97% 95% 97% 96%    Wt Readings from Last 3 Encounters:  08/16/14 78.835 kg (173 lb 12.8 oz)  04/16/14 76.658 kg (169 lb)  01/17/14 76.204 kg (168 lb)    No intake or output data in the 24 hours ending 08/17/14 1233   Physical Exam  Awake Alert, Oriented X 3, No new F.N deficits, Normal affect [place].AT,PERRAL Supple Neck,No JVD, No cervical lymphadenopathy appriciated.  Symmetrical Chest wall movement, Good air  movement bilaterally, CTAB RRR,No Gallops,Rubs or new Murmurs, No Parasternal Heave +ve B.Sounds, Abd Soft, No tenderness, No organomegaly appriciated, No rebound - guarding or rigidity. No Cyanosis, Clubbing or edema, No new Rash or bruise     Data Review   Micro Results No results found for this or any previous visit (from the past 240 hour(s)).  Radiology Reports Ct Head Wo Contrast  08/16/2014   CLINICAL DATA:  Could stroke, right facial droop  EXAM: CT HEAD WITHOUT CONTRAST  TECHNIQUE: Contiguous axial images were obtained from the base of the skull through the vertex without intravenous contrast.  COMPARISON:  Head CT 06/18/2013  FINDINGS: There is new hypodensity within the medial inferior left temporal lobe (image 14, series 201). No evidence of intracranial hemorrhage. There is generalized cortical atrophy. There is periventricular white matter hypodensities not changed from prior. Paranasal sinuses and mastoid air cells are clear. There is polypoid mucosal thickening in the right maxillary sinus.  IMPRESSION: 1. Acute versus subacute infarction in the medial aspect of the left temporal lobe operculum. 2. No intracranial hemorrhage identified. 3. Atrophy and microvascular disease again demonstrated. [facility] 08/16/2014  at19:40.   Electronically Signed   By: Suzy Bouchard M.D.   On: 08/16/2014 19:41   Mr Jeri Cos VQ Contrast  08/17/2014   CLINICAL DATA:  Confusion, speech difficulty, onset of symptoms this afternoon.  EXAM: MRI HEAD WITHOUT CONTRAST  MRA HEAD WITHOUT CONTRAST  TECHNIQUE: Multiplanar, multiecho pulse sequences of the brain and surrounding structures were obtained without intravenous contrast. Angiographic images of the head were obtained using MRA technique without contrast.  COMPARISON:  CT of the head August 16, 2014 at 1915 hr and MRI of the head April 16, 2012  FINDINGS: MRI HEAD FINDINGS  Very faint area reduced diffusion in LEFT temporal lobe,  minimal low ADC values associated with a 9 x 18 mm avidly enhancing mass within the LEFT temporal gray-white matter junction superimposed area of T2 shine through. Associated T2 bright mildly expansile signal. A second cystic 14 x 29 mm RIGHT mesial periventricular occipital lobe mass. Punctate focus of enhancement along the LEFT occipital lobe white matter. No susceptibility artifact to suggest hemorrhage. No reduced diffusion to suggest acute ischemia.  Ventricles and sulci are otherwise normal for patient's age. No midline shift. Innumerable tiny T2 hyperintensities within the basal ganglia and thalamus are relatively unchanged. Patchy supratentorial white matter changes exclusive of the aforementioned abnormality suggest chronic small vessel ischemic disease.  No abnormal extra-axial fluid collections, extra-axial masses or abnormal extra-axial enhancement.  Status post bilateral ocular lens implants. Mild paranasal sinus mucosal thickening without air-fluid levels. Minimal LEFT mastoid effusion.  No suspicious calvarial bone marrow signal. No cerebellar tonsillar ectopia. No abnormal sellar expansion.  MRA HEAD FINDINGS  Anterior circulation: Normal flow related enhancement of the cervical, petrous, cavernous and supra clinoid internal carotid arteries. Normal flow related enhancement anterior and middle cerebral arteries.  Posterior circulation: Codominant vertebral arteries, normal flow related enhancement the vertebral arteries, vertebrobasilar junction, basilar artery main branch vessels. Normal flow related enhancement posterior cerebral arteries.  No large vessel occlusion, aneurysm, high-grade stenosis, suspicious luminal irregularity within the anterior nor posterior circulation.  IMPRESSION: MRI HEAD:  No acute ischemia.  9 x 18 mm LEFT temporal solid enhancing mass, LEFT mesial occipital lobe 14 x 20 mm cystic enhancing mass, punctate of additional enhancement within the LEFT occipital lobe, with  surrounding nonenhancing mildly expansile FLAIR T2 hyperintense signal. 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 This could reflect metastasis, though metastasis typically have more mass effect.  Similar basal ganglia and thalamus perivascular spaces and/or remote lacunar infarcts. Mild white matter changes suggest chronic small vessel ischemic disease, similar.  MRA HEAD:  Normal MRA of the intracranial vessels.   Electronically Signed   By: Elon Alas   On: 08/17/2014 00:30   Mr Jodene Nam Head/brain Wo Cm  08/17/2014   CLINICAL DATA:  Confusion, speech difficulty, onset of symptoms this afternoon.  EXAM: MRI HEAD WITHOUT CONTRAST  MRA HEAD WITHOUT CONTRAST  TECHNIQUE: Multiplanar, multiecho pulse sequences of the brain and surrounding structures were obtained without intravenous contrast. Angiographic images of the head were obtained using MRA technique without contrast.  COMPARISON:  CT of the head August 16, 2014 at 1915 hr and MRI of the head April 16, 2012  FINDINGS: MRI HEAD FINDINGS  Very faint area reduced diffusion in LEFT temporal lobe, minimal low ADC values associated with a 9 x 18 mm avidly enhancing mass within the LEFT temporal gray-white matter junction superimposed area of T2 shine through. Associated T2 bright mildly expansile signal. A second cystic 14 x 29 mm RIGHT mesial periventricular occipital lobe mass. Punctate focus of enhancement along the LEFT occipital lobe white matter. No susceptibility artifact to suggest hemorrhage. No reduced diffusion to suggest acute ischemia.  Ventricles and sulci are otherwise normal for patient's age. No midline shift. Innumerable tiny T2 hyperintensities within the basal ganglia and thalamus are relatively unchanged. Patchy supratentorial white matter changes exclusive of the aforementioned abnormality suggest chronic small vessel ischemic disease.   No abnormal extra-axial fluid collections, extra-axial masses or abnormal extra-axial enhancement.  Status post bilateral ocular lens implants. Mild paranasal sinus mucosal thickening without air-fluid levels. Minimal LEFT mastoid effusion. No suspicious calvarial bone marrow signal. No cerebellar tonsillar ectopia. No abnormal sellar expansion.  MRA HEAD FINDINGS  Anterior circulation: Normal flow related enhancement of the cervical, petrous, cavernous and supra clinoid internal carotid arteries. Normal flow related enhancement anterior and middle cerebral arteries.  Posterior circulation: Codominant vertebral arteries, normal flow related enhancement the vertebral arteries, vertebrobasilar junction, basilar artery main branch vessels. Normal flow related enhancement posterior cerebral arteries.  No large vessel occlusion, aneurysm, high-grade stenosis, suspicious luminal irregularity within the anterior nor posterior circulation.  IMPRESSION: MRI HEAD:  No acute ischemia.  9 x 18 mm LEFT temporal solid enhancing mass, LEFT mesial occipital lobe 14 x 20 mm cystic enhancing mass, punctate of additional enhancement within the LEFT occipital lobe, with surrounding nonenhancing mildly expansile FLAIR T2 hyperintense signal. 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 This could reflect metastasis, though metastasis typically have more mass effect.  Similar basal ganglia and thalamus perivascular spaces and/or remote lacunar infarcts. Mild white matter changes suggest chronic small vessel ischemic disease, similar.  MRA HEAD:  Normal MRA of the intracranial vessels.   Electronically Signed   By: Elon Alas   On: 08/17/2014 00:30    CBC  Recent Labs Lab 08/16/14 1911 08/16/14 2102  WBC 9.8  --   HGB 14.1 15.3  HCT 41.9 45.0  PLT 191  --   MCV 89.7  --   MCH 30.2  --   MCHC 33.7  --   RDW 14.0  --     LYMPHSABS 0.9  --   MONOABS 0.4  --   EOSABS 0.0  --   BASOSABS 0.0  --     Chemistries   Recent Labs Lab 08/16/14 1911 08/16/14 2102  NA 139 141  K 3.7 3.7  CL 104 104  CO2 27  --   GLUCOSE 150* 147*  BUN 14 17  CREATININE 1.20 1.20  CALCIUM 9.5  --   AST 21  --   ALT 13  --   ALKPHOS 55  --   BILITOT 0.8  --    ------------------------------------------------------------------------------------------------------------------ estimated creatinine clearance is 41.2 mL/min (by C-G formula based on Cr of 1.2). ------------------------------------------------------------------------------------------------------------------ No results for input(s): HGBA1C in the last 72 hours. ------------------------------------------------------------------------------------------------------------------  Recent Labs  08/17/14 0530  CHOL 135  HDL 37*  LDLCALC 81  TRIG 86  CHOLHDL 3.6   ------------------------------------------------------------------------------------------------------------------ No results for input(s): TSH, T4TOTAL, T3FREE, THYROIDAB in the last 72 hours.  Invalid input(s): FREET3 ------------------------------------------------------------------------------------------------------------------ No results for input(s): VITAMINB12, FOLATE, FERRITIN, TIBC, IRON, RETICCTPCT in the last 72 hours.  Coagulation profile  Recent Labs Lab 08/16/14 1911  INR 1.03    No results for input(s): DDIMER in the last 72 hours.  Cardiac Enzymes No results for input(s): CKMB, TROPONINI, MYOGLOBIN in the last 168 hours.  Invalid input(s): CK ------------------------------------------------------------------------------------------------------------------ Invalid input(s): POCBNP     Time Spent in minutes   35 minutes    Allie Gerhold M.D on 08/17/2014 at 12:33 PM  Between 7am to 7pm - Pager - [phone]  After 7pm go to [email or web address] - password [email or web address]  And  look for the night coverage person covering for me after hours  [facility] Office  [phone]   **Disclaimer: This note may have been dictated with voice recognition software. Similar sounding words can inadvertently be transcribed and this note may contain transcription errors which may not have been corrected upon publication of note.**

## 2014-08-17 NOTE — Consult Note (Signed)
Reason for Consult: Left brain tumors, seizures Referring Physician: The patient is an 81 year old white male who had some mental status changes and was admitted with a code stroke. Workup included a head CT and a brain MRI which demonstrated 2 left sided brain lesions. The patient is in the process of being worked up with a CT of the chest, abdomen and pelvis to rule out a primary lesion. A neurosurgical consultation was requested.  Presently the patient is alert and pleasant and in no apparent distress. He admits to an occasional mild headache but nothing significant. He's had no vomiting. He does not have a history of cancer.  Christopher [name] is an 81 y.o. male.  HPI: As above  Past Medical History  Diagnosis Date  . GERD (gastroesophageal reflux disease) 08/1996    with large HH and partially intrathoracic stomach  . Hyperlipemia 08/1986  . Hypertension Pre 1996  . Osteoporosis 2006    dexa T -2.8 (05/2012), T11 compression fx  . Pneumonia 59 yoa  . History of Doppler echocardiogram 05/1995    MVP, MOD AI, MILD TR, MILD MR  . Elevated PSA     decided against further eval  . Diverticulosis   . Bilateral sensorineural hearing loss 02/2012    some conductive left side  . Compression fracture     multiple  . AV block, 2nd degree 04/2012    seen cards, monitor for now, pt does not desire monitor/pacer  . BCC (basal cell carcinoma of skin) 08/2012    s/p excision Allyson Sabal)  . HOH (hard of hearing)   . Depression   . Chronic kidney disease     elevated PSA-no tx  . CVA (cerebral infarction) 04/20/2012    04/16/12 IMPRESSION:  1. 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 No mass effect or hemorrhage. Basilar artery perforator territory.  2. See MRA findings below.  3. Otherwise stable MRI appearance of the brain since 2011 including extensive perivascular spaces (etat crible appearance)  . Fall at home 06/12/2013    multiple rib fractures; traumatic  hemopneumothorax/notes 06/12/2013 (06/14/2013)  . Hiatal hernia 04/2014    mod-large by CXR    Past Surgical History  Procedure Laterality Date  . Cholecystectomy  05/1995  . Exercise treadmill  09/12/2006    NML  . Esophagogastroduodenoscopy  08/18/2004    H. H. Dr. Fuller Plan  . Colonoscopy  05/01/2007    divericulosis, int hemorrhoids - Dr. Fuller Plan  . Dexa  05/2012    T score -2.8 hip, T11 compression fx  . Skin full thickness graft Left 10/19/2012    Procedure: REPAIR OF LEFT EAR WITH TISSUE REARRANGEMENT SKIN GRAFT FULL THICKNESS;  Surgeon: Theodoro Kos, DO;  Location: [facility];  Service: Plastics;  Laterality: Left;  . US echocardiography  05/2013    Mod LVH, EF 60-65%, indeterminate diastolic fxn, aortic sclerosis without stenosis, mildly dilated RV with nl sys fxn, mod dilated arch  . Inguinal hernia repair Left 08/14/2010    Dr. Hassell Done  . Cataract extraction w/ intraocular lens  implant, bilateral Bilateral     Family History  Problem Relation Age of Onset  . Heart disease Mother     MI  . Hypertension Mother   . Stroke Mother     ?  . Stroke Father   . Diabetes Sister   . Hypertension Sister   . Hypertension Sister   . Hypertension Sister   . Stroke Sister  recurrent mini strokes  . Cancer Neg Hx     Social History:  reports that he has never smoked. He has never used smokeless tobacco. He reports that he does not drink alcohol or use illicit drugs.  Allergies: No Known Allergies  Medications:  I have reviewed the patient's current medications. Prior to Admission:  Prescriptions prior to admission  Medication Sig Dispense Refill Last Dose  . acetaminophen (TYLENOL) 500 MG tablet Take 500 mg by mouth daily as needed (pain).   2-3 weeks ago  . atorvastatin (LIPITOR) 10 MG tablet TAKE 1 TABLET (10 MG TOTAL) BY MOUTH DAILY. 30 tablet 6 08/16/2014 at Unknown time  . cholecalciferol (VITAMIN D) 1000 UNITS tablet Take 1,000 Units by mouth at bedtime.    08/15/2014 at Unknown time  . clopidogrel (PLAVIX) 75 MG tablet TAKE 1 TABLET EVERY DAY 30 tablet 6 08/16/2014 at Unknown time  . furosemide (LASIX) 20 MG tablet TAKE 1 TABLET EVERY DAY 30 tablet 5 08/16/2014 at Unknown time  . lisinopril (PRINIVIL,ZESTRIL) 10 MG tablet TAKE 1 TABLET BY MOUTH EVERY DAY 30 tablet 6 08/16/2014 at Unknown time  . Melatonin 10 MG CAPS Take 10 mg by mouth at bedtime.    08/15/2014 at Unknown time  . Multiple Vitamin (MULTIVITAMIN WITH MINERALS) TABS tablet Take 1 tablet by mouth daily.   08/16/2014 at Unknown time  . Omega-3 Fatty Acids (FISH OIL) 1000 MG CAPS Take 1,000 mg by mouth at bedtime.    08/15/2014 at Unknown time  . pantoprazole (PROTONIX) 40 MG tablet TAKE 1 TABLET EVERY DAY 30 tablet 9 08/16/2014 at Unknown time  . Polyethyl Glycol-Propyl Glycol (SYSTANE OP) Place 1 drop into both eyes daily.   08/15/2014 at Unknown time  . potassium chloride SA (K-DUR,KLOR-CON) 20 MEQ tablet Take 20 mEq by mouth at bedtime.   08/15/2014 at Unknown time  . sertraline (ZOLOFT) 25 MG tablet Take 25 mg by mouth at bedtime.   08/15/2014 at Unknown time   Scheduled: . atorvastatin  10 mg Oral q1800  . cholecalciferol  1,000 Units Oral QHS  . dexamethasone  4 mg Oral 4 times per day  . heparin  5,000 Units Subcutaneous 3 times per day  . levETIRAcetam  500 mg Oral BID  . multivitamin with minerals  1 tablet Oral Daily  . pantoprazole  40 mg Oral Daily  . potassium chloride SA  20 mEq Oral QHS  . sertraline  25 mg Oral QHS   Continuous:  [id number], Melatonin, polyvinyl alcohol Anti-infectives    None       Results for orders placed or performed during the hospital encounter of 08/16/14 (from the past 48 hour(s))  Ethanol     Status: None   Collection Time: 08/16/14  7:11 PM  Result Value Ref Range   Alcohol, Ethyl (B) <5 0 - 9 mg/dL    Comment:        LOWEST DETECTABLE LIMIT FOR SERUM ALCOHOL IS 11 mg/dL FOR MEDICAL PURPOSES ONLY   Protime-INR     Status: None    Collection Time: 08/16/14  7:11 PM  Result Value Ref Range   Prothrombin Time 13.6 11.6 - 15.2 seconds   INR 1.03 0.00 - 1.49  APTT     Status: None   Collection Time: 08/16/14  7:11 PM  Result Value Ref Range   aPTT 30 24 - 37 seconds  CBC     Status: None   Collection Time: 08/16/14  7:11 PM  Result Value Ref Range   WBC 9.8 4.0 - 10.5 K/uL   RBC 4.67 4.22 - 5.81 MIL/uL   Hemoglobin 14.1 13.0 - 17.0 g/dL   HCT 41.9 39.0 - 52.0 %   MCV 89.7 78.0 - 100.0 fL   MCH 30.2 26.0 - 34.0 pg   MCHC 33.7 30.0 - 36.0 g/dL   RDW 14.0 11.5 - 15.5 %   Platelets 191 150 - 400 K/uL  Differential     Status: Abnormal   Collection Time: 08/16/14  7:11 PM  Result Value Ref Range   Neutrophils Relative % 87 (H) 43 - 77 %   Neutro Abs 8.5 (H) 1.7 - 7.7 K/uL   Lymphocytes Relative 9 (L) 12 - 46 %   Lymphs Abs 0.9 0.7 - 4.0 K/uL   Monocytes Relative 4 3 - 12 %   Monocytes Absolute 0.4 0.1 - 1.0 K/uL   Eosinophils Relative 0 0 - 5 %   Eosinophils Absolute 0.0 0.0 - 0.7 K/uL   Basophils Relative 0 0 - 1 %   Basophils Absolute 0.0 0.0 - 0.1 K/uL  Comprehensive metabolic panel     Status: Abnormal   Collection Time: 08/16/14  7:11 PM  Result Value Ref Range   Sodium 139 135 - 145 mmol/L    Comment: Please note change in reference range.   Potassium 3.7 3.5 - 5.1 mmol/L    Comment: Please note change in reference range.   Chloride 104 96 - 112 mEq/L   CO2 27 19 - 32 mmol/L   Glucose, Bld 150 (H) 70 - 99 mg/dL   BUN 14 6 - 23 mg/dL   Creatinine, Ser 1.20 0.50 - 1.35 mg/dL   Calcium 9.5 8.4 - 10.5 mg/dL   Total Protein 7.4 6.0 - 8.3 g/dL   Albumin 3.7 3.5 - 5.2 g/dL   AST 21 0 - 37 U/L   ALT 13 0 - 53 U/L   Alkaline Phosphatase 55 39 - 117 U/L   Total Bilirubin 0.8 0.3 - 1.2 mg/dL   GFR calc non Af Amer 52 (L) >90 mL/min   GFR calc Af Amer 60 (L) >90 mL/min    Comment: (NOTE) The eGFR has been calculated using the CKD EPI equation. This calculation has not been validated in all clinical  situations. eGFR's persistently <90 mL/min signify possible Chronic Kidney Disease.    Anion gap 8 5 - 15  Urine Drug Screen     Status: None   Collection Time: 08/16/14  8:35 PM  Result Value Ref Range   Opiates NONE DETECTED NONE DETECTED   Cocaine NONE DETECTED NONE DETECTED   Benzodiazepines NONE DETECTED NONE DETECTED   Amphetamines NONE DETECTED NONE DETECTED   Tetrahydrocannabinol NONE DETECTED NONE DETECTED   Barbiturates NONE DETECTED NONE DETECTED    Comment:        DRUG SCREEN FOR MEDICAL PURPOSES ONLY.  IF CONFIRMATION IS NEEDED FOR ANY PURPOSE, NOTIFY LAB WITHIN 5 DAYS.        LOWEST DETECTABLE LIMITS FOR URINE DRUG SCREEN Drug Class       Cutoff (ng/mL) Amphetamine      1000 Barbiturate      200 Benzodiazepine   353 Tricyclics       299 Opiates          300 Cocaine          300 THC              50  Urinalysis, Routine w reflex microscopic     Status: Abnormal   Collection Time: 08/16/14  8:35 PM  Result Value Ref Range   Color, Urine YELLOW YELLOW   APPearance CLEAR CLEAR   Specific Gravity, Urine 1.019 1.005 - 1.030   pH 6.0 5.0 - 8.0   Glucose, UA NEGATIVE NEGATIVE mg/dL   Hgb urine dipstick NEGATIVE NEGATIVE   Bilirubin Urine NEGATIVE NEGATIVE   Ketones, ur NEGATIVE NEGATIVE mg/dL   Protein, ur 30 (A) NEGATIVE mg/dL   Urobilinogen, UA 0.2 0.0 - 1.0 mg/dL   Nitrite NEGATIVE NEGATIVE   Leukocytes, UA NEGATIVE NEGATIVE  Urine microscopic-add on     Status: None   Collection Time: 08/16/14  8:35 PM  Result Value Ref Range   Squamous Epithelial / LPF RARE RARE   WBC, UA 0-2 <3 WBC/hpf   Bacteria, UA RARE RARE  I-Stat Chem 8, ED     Status: Abnormal   Collection Time: 08/16/14  9:02 PM  Result Value Ref Range   Sodium 141 135 - 145 mmol/L   Potassium 3.7 3.5 - 5.1 mmol/L   Chloride 104 96 - 112 mEq/L   BUN 17 6 - 23 mg/dL   Creatinine, Ser 1.20 0.50 - 1.35 mg/dL   Glucose, Bld 147 (H) 70 - 99 mg/dL   Calcium, Ion 1.14 1.13 - 1.30 mmol/L   TCO2  23 0 - 100 mmol/L   Hemoglobin 15.3 13.0 - 17.0 g/dL   HCT 45.0 39.0 - 52.0 %  I-Stat Troponin, ED (not at [facility])     Status: None   Collection Time: 08/16/14  9:04 PM  Result Value Ref Range   Troponin i, poc 0.01 0.00 - 0.08 ng/mL   Comment 3            Comment: Due to the release kinetics of cTnI, a negative result within the first hours of the onset of symptoms does not rule out myocardial infarction with certainty. If myocardial infarction is still suspected, repeat the test at appropriate intervals.   Lipid panel     Status: Abnormal   Collection Time: 08/17/14  5:30 AM  Result Value Ref Range   Cholesterol 135 0 - 200 mg/dL   Triglycerides 86 <150 mg/dL   HDL 37 (L) >39 mg/dL   Total CHOL/HDL Ratio 3.6 RATIO   VLDL 17 0 - 40 mg/dL   LDL Cholesterol 81 0 - 99 mg/dL    Comment:        Total Cholesterol/HDL:CHD Risk Coronary Heart Disease Risk Table                     Men   Women  1/2 Average Risk   3.4   3.3  Average Risk       5.0   4.4  2 X Average Risk   9.6   7.1  3 X Average Risk  23.4   11.0        Use the calculated Patient Ratio above and the CHD Risk Table to determine the patient's CHD Risk.        ATP III CLASSIFICATION (LDL):  <100     mg/dL   Optimal  100-129  mg/dL   Near or Above                    Optimal  130-159  mg/dL   Borderline  160-189  mg/dL   High  >190     mg/dL  Very High     Ct Head Wo Contrast  08/16/2014   CLINICAL DATA:  Could stroke, right facial droop  EXAM: CT HEAD WITHOUT CONTRAST  TECHNIQUE: Contiguous axial images were obtained from the base of the skull through the vertex without intravenous contrast.  COMPARISON:  Head CT 06/18/2013  FINDINGS: There is new hypodensity within the medial inferior left temporal lobe (image 14, series 201). No evidence of intracranial hemorrhage. There is generalized cortical atrophy. There is periventricular white matter hypodensities not changed from prior. Paranasal sinuses and mastoid air  cells are clear. There is polypoid mucosal thickening in the right maxillary sinus.  IMPRESSION: 1. Acute versus subacute infarction in the medial aspect of the left temporal lobe operculum. 2. No intracranial hemorrhage identified. 3. Atrophy and microvascular disease again demonstrated. [facility] 08/16/2014  at19:40.   Electronically Signed   By: Suzy Bouchard M.D.   On: 08/16/2014 19:41   Mr Jeri Cos XT Contrast  08/17/2014   CLINICAL DATA:  Confusion, speech difficulty, onset of symptoms this afternoon.  EXAM: MRI HEAD WITHOUT CONTRAST  MRA HEAD WITHOUT CONTRAST  TECHNIQUE: Multiplanar, multiecho pulse sequences of the brain and surrounding structures were obtained without intravenous contrast. Angiographic images of the head were obtained using MRA technique without contrast.  COMPARISON:  CT of the head August 16, 2014 at 1915 hr and MRI of the head April 16, 2012  FINDINGS: MRI HEAD FINDINGS  Very faint area reduced diffusion in LEFT temporal lobe, minimal low ADC values associated with a 9 x 18 mm avidly enhancing mass within the LEFT temporal gray-white matter junction superimposed area of T2 shine through. Associated T2 bright mildly expansile signal. A second cystic 14 x 29 mm RIGHT mesial periventricular occipital lobe mass. Punctate focus of enhancement along the LEFT occipital lobe white matter. No susceptibility artifact to suggest hemorrhage. No reduced diffusion to suggest acute ischemia.  Ventricles and sulci are otherwise normal for patient's age. No midline shift. Innumerable tiny T2 hyperintensities within the basal ganglia and thalamus are relatively unchanged. Patchy supratentorial white matter changes exclusive of the aforementioned abnormality suggest chronic small vessel ischemic disease.  No abnormal extra-axial fluid collections, extra-axial masses or abnormal extra-axial enhancement.  Status post bilateral ocular lens implants. Mild paranasal sinus mucosal  thickening without air-fluid levels. Minimal LEFT mastoid effusion. No suspicious calvarial bone marrow signal. No cerebellar tonsillar ectopia. No abnormal sellar expansion.  MRA HEAD FINDINGS  Anterior circulation: Normal flow related enhancement of the cervical, petrous, cavernous and supra clinoid internal carotid arteries. Normal flow related enhancement anterior and middle cerebral arteries.  Posterior circulation: Codominant vertebral arteries, normal flow related enhancement the vertebral arteries, vertebrobasilar junction, basilar artery main branch vessels. Normal flow related enhancement posterior cerebral arteries.  No large vessel occlusion, aneurysm, high-grade stenosis, suspicious luminal irregularity within the anterior nor posterior circulation.  IMPRESSION: MRI HEAD:  No acute ischemia.  9 x 18 mm LEFT temporal solid enhancing mass, LEFT mesial occipital lobe 14 x 20 mm cystic enhancing mass, punctate of additional enhancement within the LEFT occipital lobe, with surrounding nonenhancing mildly expansile FLAIR T2 hyperintense signal. 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 This could reflect metastasis, though metastasis typically have more mass effect.  Similar basal ganglia and thalamus perivascular spaces and/or remote lacunar infarcts. Mild white matter changes suggest chronic small vessel ischemic disease, similar.  MRA HEAD:  Normal MRA of the intracranial vessels.  Electronically Signed   By: Elon Alas   On: 08/17/2014 00:30   Mr Jodene Nam Head/brain Wo Cm  08/17/2014   CLINICAL DATA:  Confusion, speech difficulty, onset of symptoms this afternoon.  EXAM: MRI HEAD WITHOUT CONTRAST  MRA HEAD WITHOUT CONTRAST  TECHNIQUE: Multiplanar, multiecho pulse sequences of the brain and surrounding structures were obtained without intravenous contrast. Angiographic images of the head were  obtained using MRA technique without contrast.  COMPARISON:  CT of the head August 16, 2014 at 1915 hr and MRI of the head April 16, 2012  FINDINGS: MRI HEAD FINDINGS  Very faint area reduced diffusion in LEFT temporal lobe, minimal low ADC values associated with a 9 x 18 mm avidly enhancing mass within the LEFT temporal gray-white matter junction superimposed area of T2 shine through. Associated T2 bright mildly expansile signal. A second cystic 14 x 29 mm RIGHT mesial periventricular occipital lobe mass. Punctate focus of enhancement along the LEFT occipital lobe white matter. No susceptibility artifact to suggest hemorrhage. No reduced diffusion to suggest acute ischemia.  Ventricles and sulci are otherwise normal for patient's age. No midline shift. Innumerable tiny T2 hyperintensities within the basal ganglia and thalamus are relatively unchanged. Patchy supratentorial white matter changes exclusive of the aforementioned abnormality suggest chronic small vessel ischemic disease.  No abnormal extra-axial fluid collections, extra-axial masses or abnormal extra-axial enhancement.  Status post bilateral ocular lens implants. Mild paranasal sinus mucosal thickening without air-fluid levels. Minimal LEFT mastoid effusion. No suspicious calvarial bone marrow signal. No cerebellar tonsillar ectopia. No abnormal sellar expansion.  MRA HEAD FINDINGS  Anterior circulation: Normal flow related enhancement of the cervical, petrous, cavernous and supra clinoid internal carotid arteries. Normal flow related enhancement anterior and middle cerebral arteries.  Posterior circulation: Codominant vertebral arteries, normal flow related enhancement the vertebral arteries, vertebrobasilar junction, basilar artery main branch vessels. Normal flow related enhancement posterior cerebral arteries.  No large vessel occlusion, aneurysm, high-grade stenosis, suspicious luminal irregularity within the anterior nor posterior circulation.   IMPRESSION: MRI HEAD:  No acute ischemia.  9 x 18 mm LEFT temporal solid enhancing mass, LEFT mesial occipital lobe 14 x 20 mm cystic enhancing mass, punctate of additional enhancement within the LEFT occipital lobe, with surrounding nonenhancing mildly expansile FLAIR T2 hyperintense signal. 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 This could reflect metastasis, though metastasis typically have more mass effect.  Similar basal ganglia and thalamus perivascular spaces and/or remote lacunar infarcts. Mild white matter changes suggest chronic small vessel ischemic disease, similar.  MRA HEAD:  Normal MRA of the intracranial vessels.   Electronically Signed   By: Elon Alas   On: 08/17/2014 00:30    ROS as above Blood pressure 154/72, pulse 61, temperature 98.1 F (36.7 C), temperature source Oral, resp. rate 18, height 5' 8"  (1.727 m), weight 78.835 kg (173 lb 12.8 oz), SpO2 95 %. Physical Exam  General: An alert and pleasant 81 year old white male in no apparent distress  HEENT: Normocephalic, atraumatic, his pupils are equal round reactive light. Extraocular muscles are intact. He has poor dentition.  Neck: Supple without masses or deformities. He has an age-appropriate decreased cervical range of motion.  Thorax: Symmetric  Abdomen: Soft  Extremities: Unremarkable  Back exam: Unremarkable  Neurologic exam: The patient is alert and oriented 3. Glasgow Coma Scale 15. Cranial nerves II through XII are examined bilaterally and grossly normal except for presbyopia and left presbycusis. The  patient's motor strength is grossly normal his bilateral biceps, triceps, hand grip, quadriceps, gastrocnemius, dorsiflexors. Cerebellar function is intact to rapid alternating movements of the upper extremities bilaterally. Sensory function is intact to light touch sensation all tested dermatomes  bilaterally.  I have reviewed the patient's brain MRI performed at [facility] today. It demonstrates the patient has a left superficial temporal lesion and a left occipital/periventricular lesion. Neither which have significant mass effect or edema.  Assessment/Plan: Left brain tumors: I have discussed the situation with the patient. The possibilities include metastatic cancer, primary brain tumor such as a multifocal glioblastoma, and much less likely is stroke, infection, etc. I have recommended we wait to see what the CT shows. If he has a biopsy able primary lesion then it would be easiest to biopsy the primary lesion, otherwise we can consider a craniotomy to resect the left temporal lesion as it is fairly superficial and fairly anterior.  Teneka Malmberg D 08/17/2014, 3:51 PM

## 2014-08-17 NOTE — Evaluation (Addendum)
Clinical/Bedside Swallow Evaluation Patient Details  Name: Christopher [name] MRN: [id number] Date of Birth: 1923-11-13  Today's Date: 08/17/2014 Time: 1040-1110 SLP Time Calculation (min) (ACUTE ONLY): 30 min  Past Medical History:  Past Medical History  Diagnosis Date  . GERD (gastroesophageal reflux disease) 08/1996    with large HH and partially intrathoracic stomach  . Hyperlipemia 08/1986  . Hypertension Pre 1996  . Osteoporosis 2006    dexa T -2.8 (05/2012), T11 compression fx  . Pneumonia 79 yoa  . History of Doppler echocardiogram 05/1995    MVP, MOD AI, MILD TR, MILD MR  . Elevated PSA     decided against further eval  . Diverticulosis   . Bilateral sensorineural hearing loss 02/2012    some conductive left side  . Compression fracture     multiple  . AV block, 2nd degree 04/2012    seen cards, monitor for now, pt does not desire monitor/pacer  . BCC (basal cell carcinoma of skin) 08/2012    s/p excision Allyson Sabal)  . HOH (hard of hearing)   . Depression   . Chronic kidney disease     elevated PSA-no tx  . CVA (cerebral infarction) 04/20/2012    04/16/12 IMPRESSION:  1. 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 No mass effect or hemorrhage. Basilar artery perforator territory.  2. See MRA findings below.  3. Otherwise stable MRI appearance of the brain since 2011 including extensive perivascular spaces (etat crible appearance)  . Fall at home 06/12/2013    multiple rib fractures; traumatic hemopneumothorax/notes 06/12/2013 (06/14/2013)  . Hiatal hernia 04/2014    mod-large by CXR   Past Surgical History:  Past Surgical History  Procedure Laterality Date  . Cholecystectomy  05/1995  . Exercise treadmill  09/12/2006    NML  . Esophagogastroduodenoscopy  08/18/2004    H. H. Dr. Fuller Plan  . Colonoscopy  05/01/2007    divericulosis, int hemorrhoids - Dr. Fuller Plan  . Dexa  05/2012    T score -2.8 hip, T11 compression fx  . Skin full thickness graft Left  10/19/2012    Procedure: REPAIR OF LEFT EAR WITH TISSUE REARRANGEMENT SKIN GRAFT FULL THICKNESS;  Surgeon: Theodoro Kos, DO;  Location: [facility];  Service: Plastics;  Laterality: Left;  . US echocardiography  05/2013    Mod LVH, EF 60-65%, indeterminate diastolic fxn, aortic sclerosis without stenosis, mildly dilated RV with nl sys fxn, mod dilated arch  . Inguinal hernia repair Left 08/14/2010    Dr. Hassell Done  . Cataract extraction w/ intraocular lens  implant, bilateral Bilateral    HPI:  Christopher [name] is a 81 y.o. male with h/o stroke, lives alone and ambulates at baseline.  LKW at 10:30 when he spoke with daughter this morning.  When son called around mid day did not answer phone.  Called again later this afternoon, no answer.  When they went to check on him found patient on side of bed "incoherent".  EMS called and patient brought in for evaluation.   Assessment / Plan / Recommendation Clinical Impression  Patient presents with a mild oralphrayngeal dysphagia characterized by decreased mastication and oral manipulation of hard solid bolus, and decreased laryngeal elevation. Patient has a h/o dysphagia and was a patient in inpatient rehab following CVA. He and spouse both able to recall and demonstrate his swallow precautions/recommendations from his previous CIR admission. Patient did not exhibit any overt s/s aspiration with any of the tested boluses.  Aspiration Risk  Mild    Diet Recommendation Dysphagia 3 (Mechanical Soft);Thin liquid   Liquid Administration via: Cup;Straw Medication Administration: Whole meds with puree Supervision: Patient able to self feed;Intermittent supervision to cue for compensatory strategies Compensations: Small sips/bites;Slow rate Postural Changes and/or Swallow Maneuvers: Seated upright 90 degrees;Upright 30-60 min after meal    Other  Recommendations Oral Care Recommendations: Oral care BID   Follow Up Recommendations   (likely will  not need any f/u beyond this admission)    Frequency and Duration min 2x/week  1 week   Pertinent Vitals/Pain     SLP Swallow Goals     Swallow Study Prior Functional Status  Type of Home: House Available Help at Discharge: Family;Available PRN/intermittently    General Date of Onset: 08/16/14 HPI: Christopher [name] is a 81 y.o. male with h/o stroke, lives alone and ambulates at baseline.  LKW at 10:30 when he spoke with daughter this morning.  When son called around mid day did not answer phone.  Called again later this afternoon, no answer.  When they went to check on him found patient on side of bed "incoherent".  EMS called and patient brought in for evaluation. Type of Study: Bedside swallow evaluation Previous Swallow Assessment: 10/31/13: MBSS: recommended regular solids, thin liquids. Diet Prior to this Study: NPO Temperature Spikes Noted: No Respiratory Status: Room air History of Recent Intubation: No Behavior/Cognition: Alert;Cooperative;Pleasant mood Oral Cavity - Dentition: Adequate natural dentition Self-Feeding Abilities: Able to feed self Patient Positioning: Upright in bed Baseline Vocal Quality: Clear Volitional Cough: Strong Volitional Swallow: Able to elicit    Oral/Motor/Sensory Function Overall Oral Motor/Sensory Function: Appears within functional limits for tasks assessed   Ice Chips Ice chips: Not tested   Thin Liquid Thin Liquid: Within functional limits Presentation: Cup;Straw Pharyngeal  Phase Impairments: Suspected delayed Swallow Other Comments: No overt s/s aspiration with thin liquids    Nectar Thick Nectar Thick Liquid: Not tested   Honey Thick Honey Thick Liquid: Not tested   Puree Puree: Within functional limits Presentation: Self Fed Oral Phase Impairments: Reduced lingual movement/coordination;Impaired anterior to posterior transit Other Comments: No overt s/s aspiration.   Solid   GO    Solid: Impaired Oral Phase Impairments:  Impaired anterior to posterior transit Pharyngeal Phase Impairments: Decreased hyoid-laryngeal movement;Multiple swallows Other Comments: No overt s/s aspiration       Christopher [name] 08/17/2014,5:37 PM  Christopher Monarch, MA, CCC-SLP

## 2014-08-18 DIAGNOSIS — G939 Disorder of brain, unspecified: Secondary | ICD-10-CM

## 2014-08-18 LAB — BASIC METABOLIC PANEL
Anion gap: 4 — ABNORMAL LOW (ref 5–15)
BUN: 20 mg/dL (ref 6–23)
CO2: 29 mmol/L (ref 19–32)
CREATININE: 1.14 mg/dL (ref 0.50–1.35)
Calcium: 9.3 mg/dL (ref 8.4–10.5)
Chloride: 106 mEq/L (ref 96–112)
GFR calc non Af Amer: 55 mL/min — ABNORMAL LOW (ref >90)
GFR, EST AFRICAN AMERICAN: 64 mL/min — AB (ref >90)
GLUCOSE: 144 mg/dL — AB (ref 70–99)
Potassium: 4 mmol/L (ref 3.5–5.1)
SODIUM: 139 mmol/L (ref 135–145)

## 2014-08-18 LAB — CBC
HEMATOCRIT: 38.2 % — AB (ref 39.0–52.0)
Hemoglobin: 12.8 g/dL — ABNORMAL LOW (ref 13.0–17.0)
MCH: 29.8 pg (ref 26.0–34.0)
MCHC: 33.5 g/dL (ref 30.0–36.0)
MCV: 88.8 fL (ref 78.0–100.0)
PLATELETS: 181 10*3/uL (ref 150–400)
RBC: 4.3 MIL/uL (ref 4.22–5.81)
RDW: 13.7 % (ref 11.5–15.5)
WBC: 8.8 10*3/uL (ref 4.0–10.5)

## 2014-08-18 MED ORDER — LISINOPRIL 10 MG PO TABS
10.0000 mg | ORAL_TABLET | Freq: Every day | ORAL | Status: DC
  Administered 2014-08-19 – 2014-08-20 (×2): 10 mg via ORAL
  Filled 2014-08-18 (×2): qty 1

## 2014-08-18 MED ORDER — SODIUM CHLORIDE 0.9 % IV SOLN
INTRAVENOUS | Status: AC
  Administered 2014-08-18: 12:00:00 via INTRAVENOUS

## 2014-08-18 NOTE — Progress Notes (Signed)
Patient ID: Christopher [name], male   DOB: [birth year], 81 y.o.   MRN: [id number] Subjective:  The patient is alert and pleasant. He is accompanied by his daughter. He has no complaints.  Objective: Vital signs in last 24 hours: Temp:  [97.6 F (36.4 C)-98.6 F (37 C)] 97.7 F (36.5 C) (01/10 1005) Pulse Rate:  [47-78] 78 (01/10 1005) Resp:  [16-18] 16 (01/10 1005) BP: (124-154)/(60-83) 148/81 mmHg (01/10 1005) SpO2:  [95 %-98 %] 95 % (01/10 1005)  Intake/Output from previous day: 01/09 0701 - 01/10 0700 In: 240 [P.O.:240] Out: -  Intake/Output this shift:    Physical exam the patient is alert and oriented 3. He is moving all 4 extremities well. Speech is normal.  Lab Results:  Recent Labs  08/16/14 1911 08/16/14 2102 08/18/14 0652  WBC 9.8  --  8.8  HGB 14.1 15.3 12.8*  HCT 41.9 45.0 38.2*  PLT 191  --  181   BMET  Recent Labs  08/16/14 1911 08/16/14 2102 08/18/14 0652  NA 139 141 139  K 3.7 3.7 4.0  CL 104 104 106  CO2 27  --  29  GLUCOSE 150* 147* 144*  BUN 14 17 20   CREATININE 1.20 1.20 1.14  CALCIUM 9.5  --  9.3    Studies/Results: Ct Head Wo Contrast  08/16/2014   CLINICAL DATA:  Could stroke, right facial droop  EXAM: CT HEAD WITHOUT CONTRAST  TECHNIQUE: Contiguous axial images were obtained from the base of the skull through the vertex without intravenous contrast.  COMPARISON:  Head CT 06/18/2013  FINDINGS: There is new hypodensity within the medial inferior left temporal lobe (image 14, series 201). No evidence of intracranial hemorrhage. There is generalized cortical atrophy. There is periventricular white matter hypodensities not changed from prior. Paranasal sinuses and mastoid air cells are clear. There is polypoid mucosal thickening in the right maxillary sinus.  IMPRESSION: 1. Acute versus subacute infarction in the medial aspect of the left temporal lobe operculum. 2. No intracranial hemorrhage identified. 3. Atrophy and microvascular disease again  demonstrated. [facility] 08/16/2014  at19:40.   Electronically Signed   By: Suzy Bouchard M.D.   On: 08/16/2014 19:41   Ct Chest W Contrast  08/17/2014   CLINICAL DATA:  81 year old male with metastatic disease to the brain. Evaluate for primary malignancy. Patient does have a history of malignant melanoma.  EXAM: CT CHEST, ABDOMEN, AND PELVIS WITH CONTRAST  TECHNIQUE: Multidetector CT imaging of the chest, abdomen and pelvis was performed following the standard protocol during bolus administration of intravenous contrast.  CONTRAST:  137mL OMNIPAQUE IOHEXOL 300 MG/ML  SOLN  COMPARISON:  Chest CT 06/12/2013. CT of the abdomen and pelvis 04/02/2012.  FINDINGS: CT CHEST FINDINGS  Mediastinum/Lymph Nodes: Heart size is mildly enlarged. There is no significant pericardial fluid, thickening or pericardial calcification. There is atherosclerosis of the thoracic aorta, the great vessels of the mediastinum and the coronary arteries, including calcified atherosclerotic plaque in the left main, left anterior descending, left circumflex and right coronary arteries. Dilatation of the distal ascending thoracic aorta which measures up to 4.9 cm in diameter. Large hiatal hernia with nearly completely intrathoracic stomach. No pathologically enlarged mediastinal, hilar or axillary lymph nodes. Severe calcifications of the aortic valve. Mild calcifications of the inferior mitral annulus.  Lungs/Pleura: Areas of scarring in the inferior segment of the lingula and in the basal segments of the left lower lobe, unchanged compared to prior study 06/12/2013. No definite suspicious  appearing pulmonary nodules or masses. No acute consolidative airspace disease. No pleural effusions. Mild linear scarring is also noted in the right lower lobe.  Musculoskeletal/Soft Tissues: Multiple vertebral body compression fractures again noted, most notable at T7 and T11 (at T11 there is 90% loss of central vertebral body  height), which are unchanged. There are no aggressive appearing lytic or blastic lesions noted in the visualized portions of the skeleton. Multiple old healed right-sided rib fractures.  CT ABDOMEN AND PELVIS FINDINGS  Hepatobiliary: 1.4 cm low-attenuation lesion in segment 2 of the liver is unchanged, compatible with a small simple cyst. No other suspicious appearing hepatic lesions are noted. Partially calcified lesion adjacent to segment 7, most compatible with some retroperitoneal fat necrosis (unchanged). Status post cholecystectomy. Minimal intrahepatic biliary ductal dilatation, and mild dilatation of the common bowel duct (12 mm in the porta hepatis), unchanged compared to the prior study from 2013, presumably related to a postcholecystectomy physiology.  Pancreas: Unremarkable.  Spleen: Unremarkable.  Adrenals/Urinary Tract: Small low-attenuation lesions in the kidneys bilaterally, the majority of which are too small to definitively characterize, but are unchanged compared to the prior examination, favored to represent tiny cysts. Largest lesion measures 2.2 cm in the interpolar region of the right kidney, compatible with a simple cyst. No hydroureteronephrosis. Small bladder wall diverticulae bilaterally, largest of which is on the left measuring 1.5 cm (similar to the prior study). Bilateral adrenal glands are normal in appearance.  Stomach/Bowel: As discussed above the stomach is essentially nearly completely intra thoracic. Stomach is otherwise unremarkable in appearance. No pathologic dilatation of small bowel or colon. Numerous colonic diverticulae are present, particularly in the descending colon and sigmoid colon, without surrounding inflammatory changes to suggest an acute diverticulitis at this time.  Vascular/Lymphatic: Atherosclerosis throughout the abdominal and pelvic vasculature, without evidence of aneurysm or dissection. No lymphadenopathy noted in the abdomen or pelvis.  Reproductive:  Prostate gland is markedly enlarged measuring up to 6.7 x 5.1 cm. Seminal vesicles are unremarkable in appearance.  Other: No significant volume of ascites.  No pneumoperitoneum.  Musculoskeletal: Old compression fractures at L1 and L2, similar to prior examination, with greater than 90% loss of central vertebral body height at L1. Old healed fractures of the right superior and inferior pubic rami. There are no aggressive appearing lytic or blastic lesions noted in the visualized portions of the skeleton.  IMPRESSION: 1. No definite primary malignancy identified in the chest, abdomen or pelvis. 2. No acute findings noted in the chest, abdomen or pelvis. 3. Extensive atherosclerosis, including left main and 3 vessel coronary artery disease. In addition, there is aneurysmal dilatation of the ascending thoracic aorta which measures up to 4.9 cm in diameter (previously the 4.8 cm in diameter on 06/12/2013). Ascending thoracic aortic aneurysm. Recommend semi-annual imaging followup by CTA or MRA and referral to cardiothoracic surgery if not already obtained. This recommendation follows 2010 ACCF/AHA/AATS/ACR/ASA/SCA/SCAI/SIR/STS/SVM Guidelines for the Diagnosis and Management of Patients With Thoracic Aortic Disease. Circulation. 2010; 121: U132-G401. 4. Massive hiatal hernia with nearly completely intrathoracic stomach, unchanged compared to prior examinations. 5. Prostatomegaly. 6. Additional incidental findings, similar to prior examinations, as above.   Electronically Signed   By: Vinnie Langton M.D.   On: 08/17/2014 18:12   Mr Jeri Cos UU Contrast  08/17/2014   CLINICAL DATA:  Confusion, speech difficulty, onset of symptoms this afternoon.  EXAM: MRI HEAD WITHOUT CONTRAST  MRA HEAD WITHOUT CONTRAST  TECHNIQUE: Multiplanar, multiecho pulse sequences of the brain and surrounding  structures were obtained without intravenous contrast. Angiographic images of the head were obtained using MRA technique without contrast.   COMPARISON:  CT of the head August 16, 2014 at 1915 hr and MRI of the head April 16, 2012  FINDINGS: MRI HEAD FINDINGS  Very faint area reduced diffusion in LEFT temporal lobe, minimal low ADC values associated with a 9 x 18 mm avidly enhancing mass within the LEFT temporal gray-white matter junction superimposed area of T2 shine through. Associated T2 bright mildly expansile signal. A second cystic 14 x 29 mm RIGHT mesial periventricular occipital lobe mass. Punctate focus of enhancement along the LEFT occipital lobe white matter. No susceptibility artifact to suggest hemorrhage. No reduced diffusion to suggest acute ischemia.  Ventricles and sulci are otherwise normal for patient's age. No midline shift. Innumerable tiny T2 hyperintensities within the basal ganglia and thalamus are relatively unchanged. Patchy supratentorial white matter changes exclusive of the aforementioned abnormality suggest chronic small vessel ischemic disease.  No abnormal extra-axial fluid collections, extra-axial masses or abnormal extra-axial enhancement.  Status post bilateral ocular lens implants. Mild paranasal sinus mucosal thickening without air-fluid levels. Minimal LEFT mastoid effusion. No suspicious calvarial bone marrow signal. No cerebellar tonsillar ectopia. No abnormal sellar expansion.  MRA HEAD FINDINGS  Anterior circulation: Normal flow related enhancement of the cervical, petrous, cavernous and supra clinoid internal carotid arteries. Normal flow related enhancement anterior and middle cerebral arteries.  Posterior circulation: Codominant vertebral arteries, normal flow related enhancement the vertebral arteries, vertebrobasilar junction, basilar artery main branch vessels. Normal flow related enhancement posterior cerebral arteries.  No large vessel occlusion, aneurysm, high-grade stenosis, suspicious luminal irregularity within the anterior nor posterior circulation.  IMPRESSION: MRI HEAD:  No acute ischemia.  9 x  18 mm LEFT temporal solid enhancing mass, LEFT mesial occipital lobe 14 x 20 mm cystic enhancing mass, punctate of additional enhancement within the LEFT occipital lobe, with surrounding nonenhancing mildly expansile FLAIR T2 hyperintense signal. 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 This could reflect metastasis, though metastasis typically have more mass effect.  Similar basal ganglia and thalamus perivascular spaces and/or remote lacunar infarcts. Mild white matter changes suggest chronic small vessel ischemic disease, similar.  MRA HEAD:  Normal MRA of the intracranial vessels.   Electronically Signed   By: Elon Alas   On: 08/17/2014 00:30   Ct Abdomen Pelvis W Contrast  08/17/2014   CLINICAL DATA:  81 year old male with metastatic disease to the brain. Evaluate for primary malignancy. Patient does have a history of malignant melanoma.  EXAM: CT CHEST, ABDOMEN, AND PELVIS WITH CONTRAST  TECHNIQUE: Multidetector CT imaging of the chest, abdomen and pelvis was performed following the standard protocol during bolus administration of intravenous contrast.  CONTRAST:  111mL OMNIPAQUE IOHEXOL 300 MG/ML  SOLN  COMPARISON:  Chest CT 06/12/2013. CT of the abdomen and pelvis 04/02/2012.  FINDINGS: CT CHEST FINDINGS  Mediastinum/Lymph Nodes: Heart size is mildly enlarged. There is no significant pericardial fluid, thickening or pericardial calcification. There is atherosclerosis of the thoracic aorta, the great vessels of the mediastinum and the coronary arteries, including calcified atherosclerotic plaque in the left main, left anterior descending, left circumflex and right coronary arteries. Dilatation of the distal ascending thoracic aorta which measures up to 4.9 cm in diameter. Large hiatal hernia with nearly completely intrathoracic stomach. No pathologically enlarged mediastinal, hilar or axillary  lymph nodes. Severe calcifications of the aortic valve. Mild calcifications of the  inferior mitral annulus.  Lungs/Pleura: Areas of scarring in the inferior segment of the lingula and in the basal segments of the left lower lobe, unchanged compared to prior study 06/12/2013. No definite suspicious appearing pulmonary nodules or masses. No acute consolidative airspace disease. No pleural effusions. Mild linear scarring is also noted in the right lower lobe.  Musculoskeletal/Soft Tissues: Multiple vertebral body compression fractures again noted, most notable at T7 and T11 (at T11 there is 90% loss of central vertebral body height), which are unchanged. There are no aggressive appearing lytic or blastic lesions noted in the visualized portions of the skeleton. Multiple old healed right-sided rib fractures.  CT ABDOMEN AND PELVIS FINDINGS  Hepatobiliary: 1.4 cm low-attenuation lesion in segment 2 of the liver is unchanged, compatible with a small simple cyst. No other suspicious appearing hepatic lesions are noted. Partially calcified lesion adjacent to segment 7, most compatible with some retroperitoneal fat necrosis (unchanged). Status post cholecystectomy. Minimal intrahepatic biliary ductal dilatation, and mild dilatation of the common bowel duct (12 mm in the porta hepatis), unchanged compared to the prior study from 2013, presumably related to a postcholecystectomy physiology.  Pancreas: Unremarkable.  Spleen: Unremarkable.  Adrenals/Urinary Tract: Small low-attenuation lesions in the kidneys bilaterally, the majority of which are too small to definitively characterize, but are unchanged compared to the prior examination, favored to represent tiny cysts. Largest lesion measures 2.2 cm in the interpolar region of the right kidney, compatible with a simple cyst. No hydroureteronephrosis. Small bladder wall diverticulae bilaterally, largest of which is on the left measuring 1.5 cm (similar to the prior study).  Bilateral adrenal glands are normal in appearance.  Stomach/Bowel: As discussed above the stomach is essentially nearly completely intra thoracic. Stomach is otherwise unremarkable in appearance. No pathologic dilatation of small bowel or colon. Numerous colonic diverticulae are present, particularly in the descending colon and sigmoid colon, without surrounding inflammatory changes to suggest an acute diverticulitis at this time.  Vascular/Lymphatic: Atherosclerosis throughout the abdominal and pelvic vasculature, without evidence of aneurysm or dissection. No lymphadenopathy noted in the abdomen or pelvis.  Reproductive: Prostate gland is markedly enlarged measuring up to 6.7 x 5.1 cm. Seminal vesicles are unremarkable in appearance.  Other: No significant volume of ascites.  No pneumoperitoneum.  Musculoskeletal: Old compression fractures at L1 and L2, similar to prior examination, with greater than 90% loss of central vertebral body height at L1. Old healed fractures of the right superior and inferior pubic rami. There are no aggressive appearing lytic or blastic lesions noted in the visualized portions of the skeleton.  IMPRESSION: 1. No definite primary malignancy identified in the chest, abdomen or pelvis. 2. No acute findings noted in the chest, abdomen or pelvis. 3. Extensive atherosclerosis, including left main and 3 vessel coronary artery disease. In addition, there is aneurysmal dilatation of the ascending thoracic aorta which measures up to 4.9 cm in diameter (previously the 4.8 cm in diameter on 06/12/2013). Ascending thoracic aortic aneurysm. Recommend semi-annual imaging followup by CTA or MRA and referral to cardiothoracic surgery if not already obtained. This recommendation follows 2010 ACCF/AHA/AATS/ACR/ASA/SCA/SCAI/SIR/STS/SVM Guidelines for the Diagnosis and Management of Patients With Thoracic Aortic Disease. Circulation. 2010; 121: U235-T614. 4. Massive hiatal hernia with nearly completely  intrathoracic stomach, unchanged compared to prior examinations. 5. Prostatomegaly. 6. Additional incidental findings, similar to prior examinations, as above.   Electronically Signed   By: Vinnie Langton M.D.   On: 08/17/2014 18:12   Mr Jodene Nam Head/brain Wo Cm  08/17/2014  CLINICAL DATA:  Confusion, speech difficulty, onset of symptoms this afternoon.  EXAM: MRI HEAD WITHOUT CONTRAST  MRA HEAD WITHOUT CONTRAST  TECHNIQUE: Multiplanar, multiecho pulse sequences of the brain and surrounding structures were obtained without intravenous contrast. Angiographic images of the head were obtained using MRA technique without contrast.  COMPARISON:  CT of the head August 16, 2014 at 1915 hr and MRI of the head April 16, 2012  FINDINGS: MRI HEAD FINDINGS  Very faint area reduced diffusion in LEFT temporal lobe, minimal low ADC values associated with a 9 x 18 mm avidly enhancing mass within the LEFT temporal gray-white matter junction superimposed area of T2 shine through. Associated T2 bright mildly expansile signal. A second cystic 14 x 29 mm RIGHT mesial periventricular occipital lobe mass. Punctate focus of enhancement along the LEFT occipital lobe white matter. No susceptibility artifact to suggest hemorrhage. No reduced diffusion to suggest acute ischemia.  Ventricles and sulci are otherwise normal for patient's age. No midline shift. Innumerable tiny T2 hyperintensities within the basal ganglia and thalamus are relatively unchanged. Patchy supratentorial white matter changes exclusive of the aforementioned abnormality suggest chronic small vessel ischemic disease.  No abnormal extra-axial fluid collections, extra-axial masses or abnormal extra-axial enhancement.  Status post bilateral ocular lens implants. Mild paranasal sinus mucosal thickening without air-fluid levels. Minimal LEFT mastoid effusion. No suspicious calvarial bone marrow signal. No cerebellar tonsillar ectopia. No abnormal sellar expansion.  MRA HEAD  FINDINGS  Anterior circulation: Normal flow related enhancement of the cervical, petrous, cavernous and supra clinoid internal carotid arteries. Normal flow related enhancement anterior and middle cerebral arteries.  Posterior circulation: Codominant vertebral arteries, normal flow related enhancement the vertebral arteries, vertebrobasilar junction, basilar artery main branch vessels. Normal flow related enhancement posterior cerebral arteries.  No large vessel occlusion, aneurysm, high-grade stenosis, suspicious luminal irregularity within the anterior nor posterior circulation.  IMPRESSION: MRI HEAD:  No acute ischemia.  9 x 18 mm LEFT temporal solid enhancing mass, LEFT mesial occipital lobe 14 x 20 mm cystic enhancing mass, punctate of additional enhancement within the LEFT occipital lobe, with surrounding nonenhancing mildly expansile FLAIR T2 hyperintense signal. 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 This could reflect metastasis, though metastasis typically have more mass effect.  Similar basal ganglia and thalamus perivascular spaces and/or remote lacunar infarcts. Mild white matter changes suggest chronic small vessel ischemic disease, similar.  MRA HEAD:  Normal MRA of the intracranial vessels.   Electronically Signed   By: Elon Alas   On: 08/17/2014 00:30    Assessment/Plan: Brain tumors: I have discussed the situation with the patient and his daughter. We discussed the various possibilities including tumor, infection, stroke, etc. We discussed both primary and metastatic tumors. The CT of his chest abdomen and pelvis is negative making metastasis somewhat less likely. We discussed the various treatment options including doing nothing, empiric radiation, stereotactic brain biopsy, craniotomy for resection of his left temporal lesion. I described the surgery to them. We have discussed the  risks of surgery including risks of anesthesia, hemorrhage, infection, stroke, seizures, incomplete tumor resection, recurrent tumor growth, medical risk, etc. We also discussed the fact that he is at increased risk of perioperative morbidity and mortality given his age. I have answered all their questions. They are going to think things over and decide what they want to do.  LOS: 2 days     Kari Montero D 08/18/2014, 11:52 AM

## 2014-08-18 NOTE — Progress Notes (Signed)
Reason for Referral: Brain tumor.   HPI: 81 year old gentleman native of [place] where he lived the majority of his life. He is a gentleman with history of hypertension and osteoporosis but in reasonably good shape. She does live alone although he has a daughter and a son nearby. He was in his usual state of health until he was afebrile and on 08/16/2014 with altered mental status when his daughter noted that he is incoherent. He had episodic confusion but really no syncope. He was evaluated for possible stroke and had an MRI of the brain which showed left temporal lobe and left occipital lobe lesion suspicious for a brain tumor. There is no thrombosis or bleeding noted. Imaging studies of the chest abdomen and pelvis did not show any evidence of primary malignancy indicating the possibility of a primary brain tumor. Neurosurgery is evaluating the patient for possible surgical resection versus a biopsy. I was asked to comment about these findings. Clinically, he is asymptomatic at this point. He still slightly weak and unsteady in his ambulation but otherwise no specific neurological deficits. He does not report any headaches, blurry vision or seizures. He does not report any fevers, chills, sweats or constitutional symptoms. His appetite is reasonable. He does not report any chest pain, shortness of breath or cough. He does not report any hemoptysis or hematemesis. He does not report any nausea, vomiting, abdominal pain or changes bowel habits. He does not report any urinary symptoms. Rest of his review of systems unremarkable.   Past Medical History  Diagnosis Date  . GERD (gastroesophageal reflux disease) 08/1996    with large HH and partially intrathoracic stomach  . Hyperlipemia 08/1986  . Hypertension Pre 1996  . Osteoporosis 2006    dexa T -2.8 (05/2012), T11 compression fx  . Pneumonia 47 yoa  . History of Doppler echocardiogram 05/1995    MVP, MOD AI, MILD TR, MILD MR  . Elevated PSA      decided against further eval  . Diverticulosis   . Bilateral sensorineural hearing loss 02/2012    some conductive left side  . Compression fracture     multiple  . AV block, 2nd degree 04/2012    seen cards, monitor for now, pt does not desire monitor/pacer  . BCC (basal cell carcinoma of skin) 08/2012    s/p excision Allyson Sabal)  . HOH (hard of hearing)   . Depression   . Chronic kidney disease     elevated PSA-no tx  . CVA (cerebral infarction) 04/20/2012    04/16/12 IMPRESSION:  1. 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 No mass effect or hemorrhage. Basilar artery perforator territory.  2. See MRA findings below.  3. Otherwise stable MRI appearance of the brain since 2011 including extensive perivascular spaces (etat crible appearance)  . Fall at home 06/12/2013    multiple rib fractures; traumatic hemopneumothorax/notes 06/12/2013 (06/14/2013)  . Hiatal hernia 04/2014    mod-large by CXR  :  Past Surgical History  Procedure Laterality Date  . Cholecystectomy  05/1995  . Exercise treadmill  09/12/2006    NML  . Esophagogastroduodenoscopy  08/18/2004    H. H. Dr. Fuller Plan  . Colonoscopy  05/01/2007    divericulosis, int hemorrhoids - Dr. Fuller Plan  . Dexa  05/2012    T score -2.8 hip, T11 compression fx  . Skin full thickness graft Left 10/19/2012    Procedure: REPAIR OF LEFT EAR WITH TISSUE REARRANGEMENT SKIN GRAFT FULL THICKNESS;  Surgeon: Theodoro Kos,  DO;  Location: [place];  Service: Plastics;  Laterality: Left;  . US echocardiography  05/2013    Mod LVH, EF 60-65%, indeterminate diastolic fxn, aortic sclerosis without stenosis, mildly dilated RV with nl sys fxn, mod dilated arch  . Inguinal hernia repair Left 08/14/2010    Dr. Hassell Done  . Cataract extraction w/ intraocular lens  implant, bilateral Bilateral   :  Current Facility-Administered Medications  Medication Dose Route Frequency Provider Last Rate Last Dose  . 0.9 %  sodium chloride  infusion   Intravenous Continuous Phillips Climes, MD      . acetaminophen (TYLENOL) tablet 500 mg  500 mg Oral Daily PRN Etta Quill, DO      . atorvastatin (LIPITOR) tablet 10 mg  10 mg Oral q1800 Etta Quill, DO   10 mg at 08/17/14 1740  . cholecalciferol (VITAMIN D) tablet 1,000 Units  1,000 Units Oral QHS Etta Quill, DO   1,000 Units at 08/17/14 2122  . dexamethasone (DECADRON) tablet 4 mg  4 mg Oral 4 times per day Phillips Climes, MD   4 mg at 08/18/14 0617  . heparin injection 5,000 Units  5,000 Units Subcutaneous 3 times per day Etta Quill, DO   5,000 Units at 08/18/14 0617  . levETIRAcetam (KEPPRA) tablet 500 mg  500 mg Oral BID Phillips Climes, MD   500 mg at 08/18/14 0923  . Melatonin TABS 3 mg  3 mg Oral QHS PRN Etta Quill, DO      . multivitamin with minerals tablet 1 tablet  1 tablet Oral Daily Etta Quill, DO   1 tablet at 08/18/14 9622  . pantoprazole (PROTONIX) EC tablet 40 mg  40 mg Oral Daily Etta Quill, DO   40 mg at 08/18/14 2979  . polyvinyl alcohol (LIQUIFILM TEARS) 1.4 % ophthalmic solution 1 drop  1 drop Both Eyes PRN Etta Quill, DO      . potassium chloride SA (K-DUR,KLOR-CON) CR tablet 20 mEq  20 mEq Oral QHS Etta Quill, DO   20 mEq at 08/17/14 2122  . sertraline (ZOLOFT) tablet 25 mg  25 mg Oral QHS Etta Quill, DO   25 mg at 08/17/14 2122       No Known Allergies:  Family History  Problem Relation Age of Onset  . Heart disease Mother     MI  . Hypertension Mother   . Stroke Mother     ?  . Stroke Father   . Diabetes Sister   . Hypertension Sister   . Hypertension Sister   . Hypertension Sister   . Stroke Sister     recurrent mini strokes  . Cancer Neg Hx   :  History   Social History  . Marital Status: Single    Spouse Name: N/A    Number of Children: 2  . Years of Education: N/A   Occupational History  . [employer]/Retired     Parttime delivers flowers   Social History Main Topics   . Smoking status: Never Smoker   . Smokeless tobacco: Never Used  . Alcohol Use: No  . Drug Use: No  . Sexual Activity: No   Other Topics Concern  . Not on file   Social History Narrative   Caffeine: 2-3 cups/day, 1 gallon iced tea   Lives alone.  Widower.  2 dogs at home.   Edu: 8th grade   Activity: walking - 34min-1hr daily  Diet: some water, vegetables daily, occasional fruits      Advanced directives: has living will at home - daughter has it. Filled out by lawyer. Daughter is HCPOA Duard Brady).   :  Pertinent items are noted in HPI.  Exam: Blood pressure 148/81, pulse 78, temperature 97.7 F (36.5 C), temperature source Oral, resp. rate 16, height 5\' 8"  (1.727 m), weight 173 lb 12.8 oz (78.835 kg), SpO2 95 %. General appearance: alert and cooperative Head: Normocephalic, without obvious abnormality Throat: lips, mucosa, and tongue normal; teeth and gums normal Neck: no adenopathy and no carotid bruit Back: symmetric, no curvature. ROM normal. No CVA tenderness. Resp: clear to auscultation bilaterally Chest wall: no tenderness Cardio: regular rate and rhythm, S1, S2 normal, no murmur, click, rub or gallop GI: soft, non-tender; bowel sounds normal; no masses,  no organomegaly Extremities: extremities normal, atraumatic, no cyanosis or edema Pulses: 2+ and symmetric Skin: Skin color, texture, turgor normal. No rashes or lesions Neurologic: Grossly normal   Recent Labs  08/16/14 1911 08/16/14 2102 08/18/14 0652  WBC 9.8  --  8.8  HGB 14.1 15.3 12.8*  HCT 41.9 45.0 38.2*  PLT 191  --  181    Recent Labs  08/16/14 1911 08/16/14 2102 08/18/14 0652  NA 139 141 139  K 3.7 3.7 4.0  CL 104 104 106  CO2 27  --  29  GLUCOSE 150* 147* 144*  BUN 14 17 20   CREATININE 1.20 1.20 1.14  CALCIUM 9.5  --  9.3       Ct Head Wo Contrast  08/16/2014   CLINICAL DATA:  Could stroke, right facial droop  EXAM: CT HEAD WITHOUT CONTRAST  TECHNIQUE: Contiguous axial  images were obtained from the base of the skull through the vertex without intravenous contrast.  COMPARISON:  Head CT 06/18/2013  FINDINGS: There is new hypodensity within the medial inferior left temporal lobe (image 14, series 201). No evidence of intracranial hemorrhage. There is generalized cortical atrophy. There is periventricular white matter hypodensities not changed from prior. Paranasal sinuses and mastoid air cells are clear. There is polypoid mucosal thickening in the right maxillary sinus.  IMPRESSION: 1. Acute versus subacute infarction in the medial aspect of the left temporal lobe operculum. 2. No intracranial hemorrhage identified. 3. Atrophy and microvascular disease again demonstrated. [facility]16[facility]2014  at19:40.   Electronically Signed   By: Suzy Bouchard M.D.   On: 08/16/2014 19:41   Ct Chest W Contrast  08/17/2014   CLINICAL DATA:  81 year old male with metastatic disease to the brain. Evaluate for primary malignancy. Patient does have a history of malignant melanoma.  EXAM: CT CHEST, ABDOMEN, AND PELVIS WITH CONTRAST  TECHNIQUE: Multidetector CT imaging of the chest, abdomen and pelvis was performed following the standard protocol during bolus administration of intravenous contrast.  CONTRAST:  145mL OMNIPAQUE IOHEXOL 300 MG/ML  SOLN  COMPARISON:  Chest CT 06/12/2013. CT of the abdomen and pelvis 04/02/2012.  FINDINGS: CT CHEST FINDINGS  Mediastinum/Lymph Nodes: Heart size is mildly enlarged. There is no significant pericardial fluid, thickening or pericardial calcification. There is atherosclerosis of the thoracic aorta, the great vessels of the mediastinum and the coronary arteries, including calcified atherosclerotic plaque in the left main, left anterior descending, left circumflex and right coronary arteries. Dilatation of the distal ascending thoracic aorta which measures up to 4.9 cm in diameter. Large hiatal hernia with nearly completely intrathoracic  stomach. No pathologically enlarged mediastinal, hilar or axillary lymph nodes.  Severe calcifications of the aortic valve. Mild calcifications of the inferior mitral annulus.  Lungs/Pleura: Areas of scarring in the inferior segment of the lingula and in the basal segments of the left lower lobe, unchanged compared to prior study 06/12/2013. No definite suspicious appearing pulmonary nodules or masses. No acute consolidative airspace disease. No pleural effusions. Mild linear scarring is also noted in the right lower lobe.  Musculoskeletal/Soft Tissues: Multiple vertebral body compression fractures again noted, most notable at T7 and T11 (at T11 there is 90% loss of central vertebral body height), which are unchanged. There are no aggressive appearing lytic or blastic lesions noted in the visualized portions of the skeleton. Multiple old healed right-sided rib fractures.  CT ABDOMEN AND PELVIS FINDINGS  Hepatobiliary: 1.4 cm low-attenuation lesion in segment 2 of the liver is unchanged, compatible with a small simple cyst. No other suspicious appearing hepatic lesions are noted. Partially calcified lesion adjacent to segment 7, most compatible with some retroperitoneal fat necrosis (unchanged). Status post cholecystectomy. Minimal intrahepatic biliary ductal dilatation, and mild dilatation of the common bowel duct (12 mm in the porta hepatis), unchanged compared to the prior study from 2013, presumably related to a postcholecystectomy physiology.  Pancreas: Unremarkable.  Spleen: Unremarkable.  Adrenals/Urinary Tract: Small low-attenuation lesions in the kidneys bilaterally, the majority of which are too small to definitively characterize, but are unchanged compared to the prior examination, favored to represent tiny cysts. Largest lesion measures 2.2 cm in the interpolar region of the right kidney, compatible with a simple cyst. No hydroureteronephrosis. Small bladder wall diverticulae bilaterally, largest of which  is on the left measuring 1.5 cm (similar to the prior study). Bilateral adrenal glands are normal in appearance.  Stomach/Bowel: As discussed above the stomach is essentially nearly completely intra thoracic. Stomach is otherwise unremarkable in appearance. No pathologic dilatation of small bowel or colon. Numerous colonic diverticulae are present, particularly in the descending colon and sigmoid colon, without surrounding inflammatory changes to suggest an acute diverticulitis at this time.  Vascular/Lymphatic: Atherosclerosis throughout the abdominal and pelvic vasculature, without evidence of aneurysm or dissection. No lymphadenopathy noted in the abdomen or pelvis.  Reproductive: Prostate gland is markedly enlarged measuring up to 6.7 x 5.1 cm. Seminal vesicles are unremarkable in appearance.  Other: No significant volume of ascites.  No pneumoperitoneum.  Musculoskeletal: Old compression fractures at L1 and L2, similar to prior examination, with greater than 90% loss of central vertebral body height at L1. Old healed fractures of the right superior and inferior pubic rami. There are no aggressive appearing lytic or blastic lesions noted in the visualized portions of the skeleton.  IMPRESSION: 1. No definite primary malignancy identified in the chest, abdomen or pelvis. 2. No acute findings noted in the chest, abdomen or pelvis. 3. Extensive atherosclerosis, including left main and 3 vessel coronary artery disease. In addition, there is aneurysmal dilatation of the ascending thoracic aorta which measures up to 4.9 cm in diameter (previously the 4.8 cm in diameter on 06/12/2013). Ascending thoracic aortic aneurysm. Recommend semi-annual imaging followup by CTA or MRA and referral to cardiothoracic surgery if not already obtained. This recommendation follows 2010 ACCF/AHA/AATS/ACR/ASA/SCA/SCAI/SIR/STS/SVM Guidelines for the Diagnosis and Management of Patients With Thoracic Aortic Disease. Circulation. 2010; 121:  Z366-Y403. 4. Massive hiatal hernia with nearly completely intrathoracic stomach, unchanged compared to prior examinations. 5. Prostatomegaly. 6. Additional incidental findings, similar to prior examinations, as above.   Electronically Signed   By: Vinnie Langton M.D.   On: 08/17/2014 18:12  Mr Jeri Cos Wo Contrast  08/17/2014   CLINICAL DATA:  Confusion, speech difficulty, onset of symptoms this afternoon.  EXAM: MRI HEAD WITHOUT CONTRAST  MRA HEAD WITHOUT CONTRAST  TECHNIQUE: Multiplanar, multiecho pulse sequences of the brain and surrounding structures were obtained without intravenous contrast. Angiographic images of the head were obtained using MRA technique without contrast.  COMPARISON:  CT of the head August 16, 2014 at 1915 hr and MRI of the head April 16, 2012  FINDINGS: MRI HEAD FINDINGS  Very faint area reduced diffusion in LEFT temporal lobe, minimal low ADC values associated with a 9 x 18 mm avidly enhancing mass within the LEFT temporal gray-white matter junction superimposed area of T2 shine through. Associated T2 bright mildly expansile signal. A second cystic 14 x 29 mm RIGHT mesial periventricular occipital lobe mass. Punctate focus of enhancement along the LEFT occipital lobe white matter. No susceptibility artifact to suggest hemorrhage. No reduced diffusion to suggest acute ischemia.  Ventricles and sulci are otherwise normal for patient's age. No midline shift. Innumerable tiny T2 hyperintensities within the basal ganglia and thalamus are relatively unchanged. Patchy supratentorial white matter changes exclusive of the aforementioned abnormality suggest chronic small vessel ischemic disease.  No abnormal extra-axial fluid collections, extra-axial masses or abnormal extra-axial enhancement.  Status post bilateral ocular lens implants. Mild paranasal sinus mucosal thickening without air-fluid levels. Minimal LEFT mastoid effusion. No suspicious calvarial bone marrow signal. No cerebellar  tonsillar ectopia. No abnormal sellar expansion.  MRA HEAD FINDINGS  Anterior circulation: Normal flow related enhancement of the cervical, petrous, cavernous and supra clinoid internal carotid arteries. Normal flow related enhancement anterior and middle cerebral arteries.  Posterior circulation: Codominant vertebral arteries, normal flow related enhancement the vertebral arteries, vertebrobasilar junction, basilar artery main branch vessels. Normal flow related enhancement posterior cerebral arteries.  No large vessel occlusion, aneurysm, high-grade stenosis, suspicious luminal irregularity within the anterior nor posterior circulation.  IMPRESSION: MRI HEAD:  No acute ischemia.  9 x 18 mm LEFT temporal solid enhancing mass, LEFT mesial occipital lobe 14 x 20 mm cystic enhancing mass, punctate of additional enhancement within the LEFT occipital lobe, with surrounding nonenhancing mildly expansile FLAIR T2 hyperintense signal. 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 This could reflect metastasis, though metastasis typically have more mass effect.  Similar basal ganglia and thalamus perivascular spaces and/or remote lacunar infarcts. Mild white matter changes suggest chronic small vessel ischemic disease, similar.  MRA HEAD:  Normal MRA of the intracranial vessels.   Electronically Signed   By: Elon Alas   On: 08/17/2014 00:30   Ct Abdomen Pelvis W Contrast  08/17/2014   CLINICAL DATA:  81 year old male with metastatic disease to the brain. Evaluate for primary malignancy. Patient does have a history of malignant melanoma.  EXAM: CT CHEST, ABDOMEN, AND PELVIS WITH CONTRAST  TECHNIQUE: Multidetector CT imaging of the chest, abdomen and pelvis was performed following the standard protocol during bolus administration of intravenous contrast.  CONTRAST:  150mL OMNIPAQUE IOHEXOL 300 MG/ML  SOLN  COMPARISON:   Chest CT 06/12/2013. CT of the abdomen and pelvis 04/02/2012.  FINDINGS: CT CHEST FINDINGS  Mediastinum/Lymph Nodes: Heart size is mildly enlarged. There is no significant pericardial fluid, thickening or pericardial calcification. There is atherosclerosis of the thoracic aorta, the great vessels of the mediastinum and the coronary arteries, including calcified atherosclerotic plaque in the left main, left anterior descending, left circumflex and right coronary arteries. Dilatation  of the distal ascending thoracic aorta which measures up to 4.9 cm in diameter. Large hiatal hernia with nearly completely intrathoracic stomach. No pathologically enlarged mediastinal, hilar or axillary lymph nodes. Severe calcifications of the aortic valve. Mild calcifications of the inferior mitral annulus.  Lungs/Pleura: Areas of scarring in the inferior segment of the lingula and in the basal segments of the left lower lobe, unchanged compared to prior study 06/12/2013. No definite suspicious appearing pulmonary nodules or masses. No acute consolidative airspace disease. No pleural effusions. Mild linear scarring is also noted in the right lower lobe.  Musculoskeletal/Soft Tissues: Multiple vertebral body compression fractures again noted, most notable at T7 and T11 (at T11 there is 90% loss of central vertebral body height), which are unchanged. There are no aggressive appearing lytic or blastic lesions noted in the visualized portions of the skeleton. Multiple old healed right-sided rib fractures.  CT ABDOMEN AND PELVIS FINDINGS  Hepatobiliary: 1.4 cm low-attenuation lesion in segment 2 of the liver is unchanged, compatible with a small simple cyst. No other suspicious appearing hepatic lesions are noted. Partially calcified lesion adjacent to segment 7, most compatible with some retroperitoneal fat necrosis (unchanged). Status post cholecystectomy. Minimal intrahepatic biliary ductal dilatation, and mild dilatation of the common  bowel duct (12 mm in the porta hepatis), unchanged compared to the prior study from 2013, presumably related to a postcholecystectomy physiology.  Pancreas: Unremarkable.  Spleen: Unremarkable.  Adrenals/Urinary Tract: Small low-attenuation lesions in the kidneys bilaterally, the majority of which are too small to definitively characterize, but are unchanged compared to the prior examination, favored to represent tiny cysts. Largest lesion measures 2.2 cm in the interpolar region of the right kidney, compatible with a simple cyst. No hydroureteronephrosis. Small bladder wall diverticulae bilaterally, largest of which is on the left measuring 1.5 cm (similar to the prior study). Bilateral adrenal glands are normal in appearance.  Stomach/Bowel: As discussed above the stomach is essentially nearly completely intra thoracic. Stomach is otherwise unremarkable in appearance. No pathologic dilatation of small bowel or colon. Numerous colonic diverticulae are present, particularly in the descending colon and sigmoid colon, without surrounding inflammatory changes to suggest an acute diverticulitis at this time.  Vascular/Lymphatic: Atherosclerosis throughout the abdominal and pelvic vasculature, without evidence of aneurysm or dissection. No lymphadenopathy noted in the abdomen or pelvis.  Reproductive: Prostate gland is markedly enlarged measuring up to 6.7 x 5.1 cm. Seminal vesicles are unremarkable in appearance.  Other: No significant volume of ascites.  No pneumoperitoneum.  Musculoskeletal: Old compression fractures at L1 and L2, similar to prior examination, with greater than 90% loss of central vertebral body height at L1. Old healed fractures of the right superior and inferior pubic rami. There are no aggressive appearing lytic or blastic lesions noted in the visualized portions of the skeleton.  IMPRESSION: 1. No definite primary malignancy identified in the chest, abdomen or pelvis. 2. No acute findings noted in  the chest, abdomen or pelvis. 3. Extensive atherosclerosis, including left main and 3 vessel coronary artery disease. In addition, there is aneurysmal dilatation of the ascending thoracic aorta which measures up to 4.9 cm in diameter (previously the 4.8 cm in diameter on 06/12/2013). Ascending thoracic aortic aneurysm. Recommend semi-annual imaging followup by CTA or MRA and referral to cardiothoracic surgery if not already obtained. This recommendation follows 2010 ACCF/AHA/AATS/ACR/ASA/SCA/SCAI/SIR/STS/SVM Guidelines for the Diagnosis and Management of Patients With Thoracic Aortic Disease. Circulation. 2010; 121: [id number]. 4. Massive hiatal hernia with nearly completely intrathoracic stomach, unchanged compared  to prior examinations. 5. Prostatomegaly. 6. Additional incidental findings, similar to prior examinations, as above.   Electronically Signed   By: Vinnie Langton M.D.   On: 08/17/2014 18:12   Mr Jodene Nam Head/brain Wo Cm  08/17/2014   CLINICAL DATA:  Confusion, speech difficulty, onset of symptoms this afternoon.  EXAM: MRI HEAD WITHOUT CONTRAST  MRA HEAD WITHOUT CONTRAST  TECHNIQUE: Multiplanar, multiecho pulse sequences of the brain and surrounding structures were obtained without intravenous contrast. Angiographic images of the head were obtained using MRA technique without contrast.  COMPARISON:  CT of the head August 16, 2014 at 1915 hr and MRI of the head April 16, 2012  FINDINGS: MRI HEAD FINDINGS  Very faint area reduced diffusion in LEFT temporal lobe, minimal low ADC values associated with a 9 x 18 mm avidly enhancing mass within the LEFT temporal gray-white matter junction superimposed area of T2 shine through. Associated T2 bright mildly expansile signal. A second cystic 14 x 29 mm RIGHT mesial periventricular occipital lobe mass. Punctate focus of enhancement along the LEFT occipital lobe white matter. No susceptibility artifact to suggest hemorrhage. No reduced diffusion to suggest acute  ischemia.  Ventricles and sulci are otherwise normal for patient's age. No midline shift. Innumerable tiny T2 hyperintensities within the basal ganglia and thalamus are relatively unchanged. Patchy supratentorial white matter changes exclusive of the aforementioned abnormality suggest chronic small vessel ischemic disease.  No abnormal extra-axial fluid collections, extra-axial masses or abnormal extra-axial enhancement.  Status post bilateral ocular lens implants. Mild paranasal sinus mucosal thickening without air-fluid levels. Minimal LEFT mastoid effusion. No suspicious calvarial bone marrow signal. No cerebellar tonsillar ectopia. No abnormal sellar expansion.  MRA HEAD FINDINGS  Anterior circulation: Normal flow related enhancement of the cervical, petrous, cavernous and supra clinoid internal carotid arteries. Normal flow related enhancement anterior and middle cerebral arteries.  Posterior circulation: Codominant vertebral arteries, normal flow related enhancement the vertebral arteries, vertebrobasilar junction, basilar artery main branch vessels. Normal flow related enhancement posterior cerebral arteries.  No large vessel occlusion, aneurysm, high-grade stenosis, suspicious luminal irregularity within the anterior nor posterior circulation.  IMPRESSION: MRI HEAD:  No acute ischemia.  9 x 18 mm LEFT temporal solid enhancing mass, LEFT mesial occipital lobe 14 x 20 mm cystic enhancing mass, punctate of additional enhancement within the LEFT occipital lobe, with surrounding nonenhancing mildly expansile FLAIR T2 hyperintense signal. 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 This could reflect metastasis, though metastasis typically have more mass effect.  Similar basal ganglia and thalamus perivascular spaces and/or remote lacunar infarcts. Mild white matter changes suggest chronic small vessel ischemic  disease, similar.  MRA HEAD:  Normal MRA of the intracranial vessels.   Electronically Signed   By: Elon Alas   On: 08/17/2014 00:30    Assessment and Plan:   81 year old gentleman with recent findings of a left temporal and a left occipital masses. His double left mass measures 9 x 18 mm and is occipital lobe lesion measures 14 x 20. The appearance is very suspicious for a primary brain neoplasm. CT scan images of the chest abdomen and pelvis did not reveal any primary tumor at this time. Neurosurgery is evaluating the patient for possible surgical resection versus a CT-guided biopsy.  I discussed these findings with the patient and his daughter today. I feel that these findings is very suspicious for brain neoplasm and ultimately if he is a surgical candidate, our preference  is to have surgical resection of left temporal lobe lesion. And if the pathology revealed high-grade glioma he will probably require adjuvant radiation therapy with possibly chemotherapy in the form of Temodar.  I see no indications of this being metastatic lesion at this point. Primary CNS lymphoma is also a possibility at this time and biopsy is definitely paramount in terms of dictating the next step of treatment.  I will arrange a follow-up for the patient upon his discharge after his biopsy or surgical resection is done. His case will also be discussed in the brain tumor conference once his pathology is available.  All the questions are answered to their satisfaction.

## 2014-08-18 NOTE — Progress Notes (Signed)
Patient Demographics  Christopher [name], is a 81 y.o. male, DOB - 07-Dec-1923, RAX:[id number]  Admit date - 08/16/2014   Admitting Physician Etta Quill, DO  Outpatient Primary MD for the patient is Ria Bush, MD  LOS - 2   Chief Complaint  Patient presents with  . Stroke Symptoms      Admission history of present illness/brief narrative: Christopher [name] is a 81 y.o. male with h/o stroke, lives alone and ambulates at baseline. LKW at 10:30 when he spoke with daughter this morning. When son called around mid day did not answer phone. Called again later this afternoon, no answer. When they went to check on him found patient on side of bed "incoherent". EMS called and patient brought in for evaluation. Stroke code was called, and patient had CT head showing acute versus subacute infarction of the medial aspect of the left temporal, thought initially to be a stroke, but MRI was done which did show left temporal enhancing solid mass and left occipital lobe enhancing cystic mass, suspicion for multifocal glioblastoma, normal MRA of intracranial vessels. Neurosurgery were consulted, as well oncology service.  Subjective:   Rush Farmer today has, No headache, No chest pain, No abdominal pain - No Nausea, No new weakness tingling or numbness, No Cough - SOB.  Assessment & Plan    Principal Problem:   Acute ischemic left MCA stroke Active Problems:   HLD (hyperlipidemia)   Essential hypertension   CVA (cerebral infarction)  Abnormal neurological finding with slurred speech - This is most likely related to brain tumor, suspicion for multifocal glioblastoma by radiology reading,  - CT chest abdomen pelvis does not show any evidence of metastases. - on Keppra for seizure prophylaxis -  on Decadron secondary to brain tumor - PT/OT/SLP consult. - Stopped Plavix for  possible needed for intervention - Neurosurgery consult appreciated, plan for surgery versus biopsy. - Oncology service consulted  Hypertension - on lisinopril  Hyperlipidemia - Continue with statin  History of CVA - Will hold Plavix currently, as very likely will need interventional  Code Status: Full    Family Communication: Daughter at bedside.  Disposition Plan: Remains inpatient   Procedures     Consults   neurology Neurosurgery Oncology  Medications  Scheduled Meds: . atorvastatin  10 mg Oral q1800  . cholecalciferol  1,000 Units Oral QHS  . dexamethasone  4 mg Oral 4 times per day  . heparin  5,000 Units Subcutaneous 3 times per day  . levETIRAcetam  500 mg Oral BID  . multivitamin with minerals  1 tablet Oral Daily  . pantoprazole  40 mg Oral Daily  . potassium chloride SA  20 mEq Oral QHS  . sertraline  25 mg Oral QHS   Continuous Infusions: . sodium chloride     PRN Meds:.acetaminophen, Melatonin, polyvinyl alcohol  DVT Prophylaxis - Heparin -   Lab Results  Component Value Date   PLT 181 08/18/2014    Antibiotics   Anti-infectives    None          Objective:   Filed Vitals:   08/17/14 2116 08/18/14 0128 08/18/14 0548 08/18/14 1005  BP: 151/70 152/80 124/60 148/81  Pulse: 64 47 50 78  Temp: 97.9  F (36.6 C)  97.6 F (36.4 C) 97.7 F (36.5 C)  TempSrc: Oral Oral Oral Oral  Resp: 16 16 16 16   Height:      Weight:      SpO2: 98% 97% 95% 95%    Wt Readings from Last 3 Encounters:  08/16/14 78.835 kg (173 lb 12.8 oz)  04/16/14 76.658 kg (169 lb)  01/17/14 76.204 kg (168 lb)     Intake/Output Summary (Last 24 hours) at 08/18/14 1118 Last data filed at 08/17/14 1800  Gross per 24 hour  Intake    240 ml  Output      0 ml  Net    240 ml     Physical Exam  Awake Alert, Oriented , No new F.N deficits, Normal affect [place].AT,PERRAL Supple Neck,No JVD, No cervical lymphadenopathy appriciated.  Symmetrical Chest wall  movement, Good air movement bilaterally, CTAB RRR,No Gallops,Rubs or new Murmurs, No Parasternal Heave +ve B.Sounds, Abd Soft, No tenderness, No organomegaly appriciated, No rebound - guarding or rigidity. No Cyanosis, Clubbing or edema, No new Rash or bruise     Data Review   Micro Results No results found for this or any previous visit (from the past 240 hour(s)).  Radiology Reports Ct Head Wo Contrast  08/16/2014   CLINICAL DATA:  Could stroke, right facial droop  EXAM: CT HEAD WITHOUT CONTRAST  TECHNIQUE: Contiguous axial images were obtained from the base of the skull through the vertex without intravenous contrast.  COMPARISON:  Head CT 06/18/2013  FINDINGS: There is new hypodensity within the medial inferior left temporal lobe (image 14, series 201). No evidence of intracranial hemorrhage. There is generalized cortical atrophy. There is periventricular white matter hypodensities not changed from prior. Paranasal sinuses and mastoid air cells are clear. There is polypoid mucosal thickening in the right maxillary sinus.  IMPRESSION: 1. Acute versus subacute infarction in the medial aspect of the left temporal lobe operculum. 2. No intracranial hemorrhage identified. 3. Atrophy and microvascular disease again demonstrated. [facility] 08/16/2014  at19:40.   Electronically Signed   By: Suzy Bouchard M.D.   On: 08/16/2014 19:41   Ct Chest W Contrast  08/17/2014   CLINICAL DATA:  82 year old male with metastatic disease to the brain. Evaluate for primary malignancy. Patient does have a history of malignant melanoma.  EXAM: CT CHEST, ABDOMEN, AND PELVIS WITH CONTRAST  TECHNIQUE: Multidetector CT imaging of the chest, abdomen and pelvis was performed following the standard protocol during bolus administration of intravenous contrast.  CONTRAST:  15mL OMNIPAQUE IOHEXOL 300 MG/ML  SOLN  COMPARISON:  Chest CT 06/12/2013. CT of the abdomen and pelvis 04/02/2012.  FINDINGS: CT CHEST  FINDINGS  Mediastinum/Lymph Nodes: Heart size is mildly enlarged. There is no significant pericardial fluid, thickening or pericardial calcification. There is atherosclerosis of the thoracic aorta, the great vessels of the mediastinum and the coronary arteries, including calcified atherosclerotic plaque in the left main, left anterior descending, left circumflex and right coronary arteries. Dilatation of the distal ascending thoracic aorta which measures up to 4.9 cm in diameter. Large hiatal hernia with nearly completely intrathoracic stomach. No pathologically enlarged mediastinal, hilar or axillary lymph nodes. Severe calcifications of the aortic valve. Mild calcifications of the inferior mitral annulus.  Lungs/Pleura: Areas of scarring in the inferior segment of the lingula and in the basal segments of the left lower lobe, unchanged compared to prior study 06/12/2013. No definite suspicious appearing pulmonary nodules or masses. No acute consolidative airspace disease. No  pleural effusions. Mild linear scarring is also noted in the right lower lobe.  Musculoskeletal/Soft Tissues: Multiple vertebral body compression fractures again noted, most notable at T7 and T11 (at T11 there is 90% loss of central vertebral body height), which are unchanged. There are no aggressive appearing lytic or blastic lesions noted in the visualized portions of the skeleton. Multiple old healed right-sided rib fractures.  CT ABDOMEN AND PELVIS FINDINGS  Hepatobiliary: 1.4 cm low-attenuation lesion in segment 2 of the liver is unchanged, compatible with a small simple cyst. No other suspicious appearing hepatic lesions are noted. Partially calcified lesion adjacent to segment 7, most compatible with some retroperitoneal fat necrosis (unchanged). Status post cholecystectomy. Minimal intrahepatic biliary ductal dilatation, and mild dilatation of the common bowel duct (12 mm in the porta hepatis), unchanged compared to the prior study from  2013, presumably related to a postcholecystectomy physiology.  Pancreas: Unremarkable.  Spleen: Unremarkable.  Adrenals/Urinary Tract: Small low-attenuation lesions in the kidneys bilaterally, the majority of which are too small to definitively characterize, but are unchanged compared to the prior examination, favored to represent tiny cysts. Largest lesion measures 2.2 cm in the interpolar region of the right kidney, compatible with a simple cyst. No hydroureteronephrosis. Small bladder wall diverticulae bilaterally, largest of which is on the left measuring 1.5 cm (similar to the prior study). Bilateral adrenal glands are normal in appearance.  Stomach/Bowel: As discussed above the stomach is essentially nearly completely intra thoracic. Stomach is otherwise unremarkable in appearance. No pathologic dilatation of small bowel or colon. Numerous colonic diverticulae are present, particularly in the descending colon and sigmoid colon, without surrounding inflammatory changes to suggest an acute diverticulitis at this time.  Vascular/Lymphatic: Atherosclerosis throughout the abdominal and pelvic vasculature, without evidence of aneurysm or dissection. No lymphadenopathy noted in the abdomen or pelvis.  Reproductive: Prostate gland is markedly enlarged measuring up to 6.7 x 5.1 cm. Seminal vesicles are unremarkable in appearance.  Other: No significant volume of ascites.  No pneumoperitoneum.  Musculoskeletal: Old compression fractures at L1 and L2, similar to prior examination, with greater than 90% loss of central vertebral body height at L1. Old healed fractures of the right superior and inferior pubic rami. There are no aggressive appearing lytic or blastic lesions noted in the visualized portions of the skeleton.  IMPRESSION: 1. No definite primary malignancy identified in the chest, abdomen or pelvis. 2. No acute findings noted in the chest, abdomen or pelvis. 3. Extensive atherosclerosis, including left main and  3 vessel coronary artery disease. In addition, there is aneurysmal dilatation of the ascending thoracic aorta which measures up to 4.9 cm in diameter (previously the 4.8 cm in diameter on 06/12/2013). Ascending thoracic aortic aneurysm. Recommend semi-annual imaging followup by CTA or MRA and referral to cardiothoracic surgery if not already obtained. This recommendation follows 2010 ACCF/AHA/AATS/ACR/ASA/SCA/SCAI/SIR/STS/SVM Guidelines for the Diagnosis and Management of Patients With Thoracic Aortic Disease. Circulation. 2010; 121: O962-X528. 4. Massive hiatal hernia with nearly completely intrathoracic stomach, unchanged compared to prior examinations. 5. Prostatomegaly. 6. Additional incidental findings, similar to prior examinations, as above.   Electronically Signed   By: Vinnie Langton M.D.   On: 08/17/2014 18:12   Mr Jeri Cos UX Contrast  08/17/2014   CLINICAL DATA:  Confusion, speech difficulty, onset of symptoms this afternoon.  EXAM: MRI HEAD WITHOUT CONTRAST  MRA HEAD WITHOUT CONTRAST  TECHNIQUE: Multiplanar, multiecho pulse sequences of the brain and surrounding structures were obtained without intravenous contrast. Angiographic images of the head  were obtained using MRA technique without contrast.  COMPARISON:  CT of the head August 16, 2014 at 1915 hr and MRI of the head April 16, 2012  FINDINGS: MRI HEAD FINDINGS  Very faint area reduced diffusion in LEFT temporal lobe, minimal low ADC values associated with a 9 x 18 mm avidly enhancing mass within the LEFT temporal gray-white matter junction superimposed area of T2 shine through. Associated T2 bright mildly expansile signal. A second cystic 14 x 29 mm RIGHT mesial periventricular occipital lobe mass. Punctate focus of enhancement along the LEFT occipital lobe white matter. No susceptibility artifact to suggest hemorrhage. No reduced diffusion to suggest acute ischemia.  Ventricles and sulci are otherwise normal for patient's age. No midline  shift. Innumerable tiny T2 hyperintensities within the basal ganglia and thalamus are relatively unchanged. Patchy supratentorial white matter changes exclusive of the aforementioned abnormality suggest chronic small vessel ischemic disease.  No abnormal extra-axial fluid collections, extra-axial masses or abnormal extra-axial enhancement.  Status post bilateral ocular lens implants. Mild paranasal sinus mucosal thickening without air-fluid levels. Minimal LEFT mastoid effusion. No suspicious calvarial bone marrow signal. No cerebellar tonsillar ectopia. No abnormal sellar expansion.  MRA HEAD FINDINGS  Anterior circulation: Normal flow related enhancement of the cervical, petrous, cavernous and supra clinoid internal carotid arteries. Normal flow related enhancement anterior and middle cerebral arteries.  Posterior circulation: Codominant vertebral arteries, normal flow related enhancement the vertebral arteries, vertebrobasilar junction, basilar artery main branch vessels. Normal flow related enhancement posterior cerebral arteries.  No large vessel occlusion, aneurysm, high-grade stenosis, suspicious luminal irregularity within the anterior nor posterior circulation.  IMPRESSION: MRI HEAD:  No acute ischemia.  9 x 18 mm LEFT temporal solid enhancing mass, LEFT mesial occipital lobe 14 x 20 mm cystic enhancing mass, punctate of additional enhancement within the LEFT occipital lobe, with surrounding nonenhancing mildly expansile FLAIR T2 hyperintense signal. 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 This could reflect metastasis, though metastasis typically have more mass effect.  Similar basal ganglia and thalamus perivascular spaces and/or remote lacunar infarcts. Mild white matter changes suggest chronic small vessel ischemic disease, similar.  MRA HEAD:  Normal MRA of the intracranial vessels.   Electronically  Signed   By: Elon Alas   On: 08/17/2014 00:30   Ct Abdomen Pelvis W Contrast  08/17/2014   CLINICAL DATA:  81 year old male with metastatic disease to the brain. Evaluate for primary malignancy. Patient does have a history of malignant melanoma.  EXAM: CT CHEST, ABDOMEN, AND PELVIS WITH CONTRAST  TECHNIQUE: Multidetector CT imaging of the chest, abdomen and pelvis was performed following the standard protocol during bolus administration of intravenous contrast.  CONTRAST:  141mL OMNIPAQUE IOHEXOL 300 MG/ML  SOLN  COMPARISON:  Chest CT 06/12/2013. CT of the abdomen and pelvis 04/02/2012.  FINDINGS: CT CHEST FINDINGS  Mediastinum/Lymph Nodes: Heart size is mildly enlarged. There is no significant pericardial fluid, thickening or pericardial calcification. There is atherosclerosis of the thoracic aorta, the great vessels of the mediastinum and the coronary arteries, including calcified atherosclerotic plaque in the left main, left anterior descending, left circumflex and right coronary arteries. Dilatation of the distal ascending thoracic aorta which measures up to 4.9 cm in diameter. Large hiatal hernia with nearly completely intrathoracic stomach. No pathologically enlarged mediastinal, hilar or axillary lymph nodes. Severe calcifications of the aortic valve. Mild calcifications of the inferior mitral annulus.  Lungs/Pleura: Areas of scarring in the inferior segment  of the lingula and in the basal segments of the left lower lobe, unchanged compared to prior study 06/12/2013. No definite suspicious appearing pulmonary nodules or masses. No acute consolidative airspace disease. No pleural effusions. Mild linear scarring is also noted in the right lower lobe.  Musculoskeletal/Soft Tissues: Multiple vertebral body compression fractures again noted, most notable at T7 and T11 (at T11 there is 90% loss of central vertebral body height), which are unchanged. There are no aggressive appearing lytic or blastic  lesions noted in the visualized portions of the skeleton. Multiple old healed right-sided rib fractures.  CT ABDOMEN AND PELVIS FINDINGS  Hepatobiliary: 1.4 cm low-attenuation lesion in segment 2 of the liver is unchanged, compatible with a small simple cyst. No other suspicious appearing hepatic lesions are noted. Partially calcified lesion adjacent to segment 7, most compatible with some retroperitoneal fat necrosis (unchanged). Status post cholecystectomy. Minimal intrahepatic biliary ductal dilatation, and mild dilatation of the common bowel duct (12 mm in the porta hepatis), unchanged compared to the prior study from 2013, presumably related to a postcholecystectomy physiology.  Pancreas: Unremarkable.  Spleen: Unremarkable.  Adrenals/Urinary Tract: Small low-attenuation lesions in the kidneys bilaterally, the majority of which are too small to definitively characterize, but are unchanged compared to the prior examination, favored to represent tiny cysts. Largest lesion measures 2.2 cm in the interpolar region of the right kidney, compatible with a simple cyst. No hydroureteronephrosis. Small bladder wall diverticulae bilaterally, largest of which is on the left measuring 1.5 cm (similar to the prior study). Bilateral adrenal glands are normal in appearance.  Stomach/Bowel: As discussed above the stomach is essentially nearly completely intra thoracic. Stomach is otherwise unremarkable in appearance. No pathologic dilatation of small bowel or colon. Numerous colonic diverticulae are present, particularly in the descending colon and sigmoid colon, without surrounding inflammatory changes to suggest an acute diverticulitis at this time.  Vascular/Lymphatic: Atherosclerosis throughout the abdominal and pelvic vasculature, without evidence of aneurysm or dissection. No lymphadenopathy noted in the abdomen or pelvis.  Reproductive: Prostate gland is markedly enlarged measuring up to 6.7 x 5.1 cm. Seminal vesicles  are unremarkable in appearance.  Other: No significant volume of ascites.  No pneumoperitoneum.  Musculoskeletal: Old compression fractures at L1 and L2, similar to prior examination, with greater than 90% loss of central vertebral body height at L1. Old healed fractures of the right superior and inferior pubic rami. There are no aggressive appearing lytic or blastic lesions noted in the visualized portions of the skeleton.  IMPRESSION: 1. No definite primary malignancy identified in the chest, abdomen or pelvis. 2. No acute findings noted in the chest, abdomen or pelvis. 3. Extensive atherosclerosis, including left main and 3 vessel coronary artery disease. In addition, there is aneurysmal dilatation of the ascending thoracic aorta which measures up to 4.9 cm in diameter (previously the 4.8 cm in diameter on 06/12/2013). Ascending thoracic aortic aneurysm. Recommend semi-annual imaging followup by CTA or MRA and referral to cardiothoracic surgery if not already obtained. This recommendation follows 2010 ACCF/AHA/AATS/ACR/ASA/SCA/SCAI/SIR/STS/SVM Guidelines for the Diagnosis and Management of Patients With Thoracic Aortic Disease. Circulation. 2010; 121: E563-J497. 4. Massive hiatal hernia with nearly completely intrathoracic stomach, unchanged compared to prior examinations. 5. Prostatomegaly. 6. Additional incidental findings, similar to prior examinations, as above.   Electronically Signed   By: Vinnie Langton M.D.   On: 08/17/2014 18:12   Mr Jodene Nam Head/brain Wo Cm  08/17/2014   CLINICAL DATA:  Confusion, speech difficulty, onset of symptoms this afternoon.  EXAM: MRI HEAD WITHOUT CONTRAST  MRA HEAD WITHOUT CONTRAST  TECHNIQUE: Multiplanar, multiecho pulse sequences of the brain and surrounding structures were obtained without intravenous contrast. Angiographic images of the head were obtained using MRA technique without contrast.  COMPARISON:  CT of the head August 16, 2014 at 1915 hr and MRI of the head  April 16, 2012  FINDINGS: MRI HEAD FINDINGS  Very faint area reduced diffusion in LEFT temporal lobe, minimal low ADC values associated with a 9 x 18 mm avidly enhancing mass within the LEFT temporal gray-white matter junction superimposed area of T2 shine through. Associated T2 bright mildly expansile signal. A second cystic 14 x 29 mm RIGHT mesial periventricular occipital lobe mass. Punctate focus of enhancement along the LEFT occipital lobe white matter. No susceptibility artifact to suggest hemorrhage. No reduced diffusion to suggest acute ischemia.  Ventricles and sulci are otherwise normal for patient's age. No midline shift. Innumerable tiny T2 hyperintensities within the basal ganglia and thalamus are relatively unchanged. Patchy supratentorial white matter changes exclusive of the aforementioned abnormality suggest chronic small vessel ischemic disease.  No abnormal extra-axial fluid collections, extra-axial masses or abnormal extra-axial enhancement.  Status post bilateral ocular lens implants. Mild paranasal sinus mucosal thickening without air-fluid levels. Minimal LEFT mastoid effusion. No suspicious calvarial bone marrow signal. No cerebellar tonsillar ectopia. No abnormal sellar expansion.  MRA HEAD FINDINGS  Anterior circulation: Normal flow related enhancement of the cervical, petrous, cavernous and supra clinoid internal carotid arteries. Normal flow related enhancement anterior and middle cerebral arteries.  Posterior circulation: Codominant vertebral arteries, normal flow related enhancement the vertebral arteries, vertebrobasilar junction, basilar artery main branch vessels. Normal flow related enhancement posterior cerebral arteries.  No large vessel occlusion, aneurysm, high-grade stenosis, suspicious luminal irregularity within the anterior nor posterior circulation.  IMPRESSION: MRI HEAD:  No acute ischemia.  9 x 18 mm LEFT temporal solid enhancing mass, LEFT mesial occipital lobe 14 x  20 mm cystic enhancing mass, punctate of additional enhancement within the LEFT occipital lobe, with surrounding nonenhancing mildly expansile FLAIR T2 hyperintense signal. 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 This could reflect metastasis, though metastasis typically have more mass effect.  Similar basal ganglia and thalamus perivascular spaces and/or remote lacunar infarcts. Mild white matter changes suggest chronic small vessel ischemic disease, similar.  MRA HEAD:  Normal MRA of the intracranial vessels.   Electronically Signed   By: Elon Alas   On: 08/17/2014 00:30    CBC  Recent Labs Lab 08/16/14 1911 08/16/14 2102 08/18/14 0652  WBC 9.8  --  8.8  HGB 14.1 15.3 12.8*  HCT 41.9 45.0 38.2*  PLT 191  --  181  MCV 89.7  --  88.8  MCH 30.2  --  29.8  MCHC 33.7  --  33.5  RDW 14.0  --  13.7  LYMPHSABS 0.9  --   --   MONOABS 0.4  --   --   EOSABS 0.0  --   --   BASOSABS 0.0  --   --     Chemistries   Recent Labs Lab 08/16/14 1911 08/16/14 2102 08/18/14 0652  NA 139 141 139  K 3.7 3.7 4.0  CL 104 104 106  CO2 27  --  29  GLUCOSE 150* 147* 144*  BUN 14 17 20   CREATININE 1.20 1.20 1.14  CALCIUM 9.5  --  9.3  AST 21  --   --  ALT 13  --   --   ALKPHOS 55  --   --   BILITOT 0.8  --   --    ------------------------------------------------------------------------------------------------------------------ estimated creatinine clearance is 43.3 mL/min (by C-G formula based on Cr of 1.14). ------------------------------------------------------------------------------------------------------------------  Recent Labs  08/17/14 0548  HGBA1C 5.8*   ------------------------------------------------------------------------------------------------------------------  Recent Labs  08/17/14 0530  CHOL 135  HDL 37*  LDLCALC 81  TRIG 86  CHOLHDL 3.6    ------------------------------------------------------------------------------------------------------------------ No results for input(s): TSH, T4TOTAL, T3FREE, THYROIDAB in the last 72 hours.  Invalid input(s): FREET3 ------------------------------------------------------------------------------------------------------------------ No results for input(s): VITAMINB12, FOLATE, FERRITIN, TIBC, IRON, RETICCTPCT in the last 72 hours.  Coagulation profile  Recent Labs Lab 08/16/14 1911  INR 1.03    No results for input(s): DDIMER in the last 72 hours.  Cardiac Enzymes No results for input(s): CKMB, TROPONINI, MYOGLOBIN in the last 168 hours.  Invalid input(s): CK ------------------------------------------------------------------------------------------------------------------ Invalid input(s): POCBNP     Time Spent in minutes   30 minutes    Ura Hausen M.D on 08/18/2014 at 11:18 AM  Between 7am to 7pm - Pager - [phone]  After 7pm go to [email or web address] - password [email or web address]  And look for the night coverage person covering for me after hours  [facility] Office  [phone]   **Disclaimer: This note may have been dictated with voice recognition software. Similar sounding words can inadvertently be transcribed and this note may contain transcription errors which may not have been corrected upon publication of note.**

## 2014-08-19 ENCOUNTER — Other Ambulatory Visit (HOSPITAL_COMMUNITY): Payer: Self-pay | Admitting: Neurosurgery

## 2014-08-19 LAB — BASIC METABOLIC PANEL
ANION GAP: 8 (ref 5–15)
BUN: 26 mg/dL — ABNORMAL HIGH (ref 6–23)
CALCIUM: 8.8 mg/dL (ref 8.4–10.5)
CHLORIDE: 109 meq/L (ref 96–112)
CO2: 21 mmol/L (ref 19–32)
Creatinine, Ser: 1.06 mg/dL (ref 0.50–1.35)
GFR, EST AFRICAN AMERICAN: 70 mL/min — AB (ref >90)
GFR, EST NON AFRICAN AMERICAN: 61 mL/min — AB (ref >90)
Glucose, Bld: 138 mg/dL — ABNORMAL HIGH (ref 70–99)
POTASSIUM: 3.5 mmol/L (ref 3.5–5.1)
Sodium: 138 mmol/L (ref 135–145)

## 2014-08-19 LAB — CBC
HCT: 37.6 % — ABNORMAL LOW (ref 39.0–52.0)
HEMOGLOBIN: 12.5 g/dL — AB (ref 13.0–17.0)
MCH: 29.6 pg (ref 26.0–34.0)
MCHC: 33.2 g/dL (ref 30.0–36.0)
MCV: 88.9 fL (ref 78.0–100.0)
Platelets: 181 10*3/uL (ref 150–400)
RBC: 4.23 MIL/uL (ref 4.22–5.81)
RDW: 13.8 % (ref 11.5–15.5)
WBC: 13.2 10*3/uL — AB (ref 4.0–10.5)

## 2014-08-19 MED ORDER — DEXAMETHASONE 2 MG PO TABS
2.0000 mg | ORAL_TABLET | Freq: Two times a day (BID) | ORAL | Status: DC
  Administered 2014-08-20: 2 mg via ORAL
  Filled 2014-08-19: qty 1

## 2014-08-19 NOTE — Progress Notes (Signed)
Chaplain introduced herself to pt and family. Pt expressed that he was thankful "to be in hospital instead of cemetary." Chaplain offered to keep pt in her prayers. Chaplain informed pt and granddaughter, Candice, of her services should they need them. Page chaplain as needed.    08/19/14 1300  Clinical Encounter Type  Visited With Patient and family together  Visit Type Initial;Spiritual support  Spiritual Encounters  Spiritual Needs Emotional;Prayer  Stress Factors  Patient Stress Factors None identified  Ashayla Subia, Barbette Hair, Chaplain 08/19/2014 1:42 PM

## 2014-08-19 NOTE — Progress Notes (Signed)
Patient Demographics  Christopher [name], is a 81 y.o. male, DOB - 10-25-1923, UPJ:[id number]  Admit date - 08/16/2014   Admitting Physician Etta Quill, DO  Outpatient Primary MD for the patient is Ria Bush, MD  LOS - 3   Chief Complaint  Patient presents with  . Stroke Symptoms      Admission history of present illness/brief narrative: Christopher [name] is a 81 y.o. male with h/o stroke, lives alone and ambulates at baseline. LKW at 10:30 when he spoke with daughter this morning. When son called around mid day did not answer phone. Called again later this afternoon, no answer. When they went to check on him found patient on side of bed "incoherent". EMS called and patient brought in for evaluation. Stroke code was called, and patient had CT head showing acute versus subacute infarction of the medial aspect of the left temporal, thought initially to be a stroke, but MRI was done which did show left temporal enhancing solid mass and left occipital lobe enhancing cystic mass, suspicion for multifocal glioblastoma, normal MRA of intracranial vessels. Neurosurgery were consulted, as well oncology service.  Subjective:   Rush Farmer today has, No headache, No chest pain, No abdominal pain - No Nausea, No new weakness tingling or numbness, No Cough - SOB.  Assessment & Plan    Principal Problem:   Acute ischemic left MCA stroke Active Problems:   HLD (hyperlipidemia)   Essential hypertension   CVA (cerebral infarction)  Abnormal neurological finding with slurred speech - This is most likely related to brain tumor, suspicion for multifocal glioblastoma by radiology reading,  - CT chest abdomen pelvis does not show any evidence of metastases. - on Keppra for seizure prophylaxis -  on Decadron secondary to brain tumor - PT/OT/SLP consult. - Stopped Plavix for  possible needed for intervention - Neurosurgery consult appreciated, plan is for biopsy as an outpatient, told by neurosurgery this coming Thursday. - Oncology consult appreciated.  Hypertension - on lisinopril  Hyperlipidemia - Continue with statin  History of CVA - Will hold Plavix currently, as very likely will need interventional  Code Status: Full    Family Communication: Daughter at bedside.  Disposition Plan: Discharged to skilled nursing facility in a.m. when bed is available.   Procedures     Consults   neurology Neurosurgery Oncology  Medications  Scheduled Meds: . atorvastatin  10 mg Oral q1800  . cholecalciferol  1,000 Units Oral QHS  . [START ON 08/20/2014] dexamethasone  2 mg Oral BID  . heparin  5,000 Units Subcutaneous 3 times per day  . levETIRAcetam  500 mg Oral BID  . lisinopril  10 mg Oral Daily  . multivitamin with minerals  1 tablet Oral Daily  . pantoprazole  40 mg Oral Daily  . potassium chloride SA  20 mEq Oral QHS  . sertraline  25 mg Oral QHS   Continuous Infusions:   PRN Meds:.acetaminophen, Melatonin, polyvinyl alcohol  DVT Prophylaxis - Heparin -   Lab Results  Component Value Date   PLT 181 08/19/2014    Antibiotics   Anti-infectives    None          Objective:   Filed Vitals:   08/19/14 0212 08/19/14  0615 08/19/14 0918 08/19/14 1325  BP: 129/73 135/69 156/82 141/78  Pulse: 52 51 55 65  Temp: 98.2 F (36.8 C) 98.1 F (36.7 C) 97.9 F (36.6 C) 97.7 F (36.5 C)  TempSrc: Oral Oral Oral Oral  Resp: 18 16 16 16   Height:      Weight:      SpO2: 96% 96% 97% 97%    Wt Readings from Last 3 Encounters:  08/16/14 78.835 kg (173 lb 12.8 oz)  04/16/14 76.658 kg (169 lb)  01/17/14 76.204 kg (168 lb)     Intake/Output Summary (Last 24 hours) at 08/19/14 1649 Last data filed at 08/19/14 0919  Gross per 24 hour  Intake    840 ml  Output      0 ml  Net    840 ml     Physical Exam  Awake Alert, Oriented ,  No new F.N deficits, Normal affect [place].AT,PERRAL Supple Neck,No JVD, No cervical lymphadenopathy appriciated.  Symmetrical Chest wall movement, Good air movement bilaterally, CTAB RRR,No Gallops,Rubs or new Murmurs, No Parasternal Heave +ve B.Sounds, Abd Soft, No tenderness, No organomegaly appriciated, No rebound - guarding or rigidity. No Cyanosis, Clubbing or edema, No new Rash or bruise     Data Review   Micro Results No results found for this or any previous visit (from the past 240 hour(s)).  Radiology Reports No results found.  CBC  Recent Labs Lab 08/16/14 1911 08/16/14 2102 08/18/14 0652 08/19/14 0608  WBC 9.8  --  8.8 13.2*  HGB 14.1 15.3 12.8* 12.5*  HCT 41.9 45.0 38.2* 37.6*  PLT 191  --  181 181  MCV 89.7  --  88.8 88.9  MCH 30.2  --  29.8 29.6  MCHC 33.7  --  33.5 33.2  RDW 14.0  --  13.7 13.8  LYMPHSABS 0.9  --   --   --   MONOABS 0.4  --   --   --   EOSABS 0.0  --   --   --   BASOSABS 0.0  --   --   --     Chemistries   Recent Labs Lab 08/16/14 1911 08/16/14 2102 08/18/14 0652 08/19/14 0608  NA 139 141 139 138  K 3.7 3.7 4.0 3.5  CL 104 104 106 109  CO2 27  --  29 21  GLUCOSE 150* 147* 144* 138*  BUN 14 17 20  26*  CREATININE 1.20 1.20 1.14 1.06  CALCIUM 9.5  --  9.3 8.8  AST 21  --   --   --   ALT 13  --   --   --   ALKPHOS 55  --   --   --   BILITOT 0.8  --   --   --    ------------------------------------------------------------------------------------------------------------------ estimated creatinine clearance is 46.6 mL/min (by C-G formula based on Cr of 1.06). ------------------------------------------------------------------------------------------------------------------  Recent Labs  08/17/14 0548  HGBA1C 5.8*   ------------------------------------------------------------------------------------------------------------------  Recent Labs  08/17/14 0530  CHOL 135  HDL 37*  LDLCALC 81  TRIG 86  CHOLHDL 3.6    ------------------------------------------------------------------------------------------------------------------ No results for input(s): TSH, T4TOTAL, T3FREE, THYROIDAB in the last 72 hours.  Invalid input(s): FREET3 ------------------------------------------------------------------------------------------------------------------ No results for input(s): VITAMINB12, FOLATE, FERRITIN, TIBC, IRON, RETICCTPCT in the last 72 hours.  Coagulation profile  Recent Labs Lab 08/16/14 1911  INR 1.03    No results for input(s): DDIMER in the last 72 hours.  Cardiac Enzymes No results for input(s): CKMB, TROPONINI,  MYOGLOBIN in the last 168 hours.  Invalid input(s): CK ------------------------------------------------------------------------------------------------------------------ Invalid input(s): POCBNP     Time Spent in minutes   30 minutes    ELGERGAWY, DAWOOD M.D on 08/19/2014 at 4:49 PM  Between 7am to 7pm - Pager - [phone]  After 7pm go to [email or web address] - password [email or web address]  And look for the night coverage person covering for me after hours  [facility] Office  [phone]   **Disclaimer: This note may have been dictated with voice recognition software. Similar sounding words can inadvertently be transcribed and this note may contain transcription errors which may not have been corrected upon publication of note.**

## 2014-08-19 NOTE — Progress Notes (Signed)
Occupational Therapy Treatment Patient Details Name: JILLIAN PIANKA MRN: [id number] DOB: [birth year] Today's Date: 08/19/2014    History of present illness 81 yo male adm 08/16/14 with facial droop and garbled speech. MRI + brain tumors Lt temporal and occipital lobes. CT abd/pelvis/chest negative for other tumors and appears brain tumors are primary cancer. Neurosurgery and oncology consulted, with ?plan for brain biopsy. Hx of prior CVA 2011 and hx of falls. Dementia   OT comments  Pt appears to demonstrate a R upper quadrant field deficit. Pt also c/o "seeing things" in parts of his vision that he knows "are not there". Pt with poor STM and daughter reports continued "confusion" at times. Daughter states 24/7 S is not available. Do not feel pt is safe to be alone at this time. Rec SNF to facilitate return to PLOF.   Follow Up Recommendations  SNF;Supervision/Assistance - 24 hour    Equipment Recommendations  None recommended by OT    Recommendations for Other Services      Precautions / Restrictions Precautions Precautions: Fall Restrictions Weight Bearing Restrictions: No       Mobility Bed Mobility               General bed mobility comments: pt up in chair  Transfers Overall transfer level: Needs assistance Equipment used: Rolling walker (2 wheeled) Transfers: Sit to/from [employer] Transfers Sit to Stand: Supervision Stand pivot transfers: Supervision       General transfer comment: assist for safety; vc for safe use of RW    Balance    ADL                                       Functional mobility during ADLs: Supervision/safety;Rolling walker General ADL Comments: Overall s for ADL      Vision Eye Alignment: Within Functional Limits Alignment/Gaze Preference: Within Defined Limits Ocular Range of Motion: Within Functional Limits Tracking/Visual Pursuits: Able to track stimulus in all quads without difficulty Saccades: Within  functional limits Convergence: Within functional limits     Additional Comments: reports words on a page  "move"; reports "seeing things" that he knows are not there; reports seeing people looking over his shoulder when he is reading the paper and also reports seeing his daughter beside him when she is not in the room"   Perception     Praxis  [facility]    Cognition   Behavior During Therapy: [facility] for tasks assessed/performed Overall Cognitive Status: Impaired/Different from baseline Area of Impairment: Memory;Awareness     Memory: Decreased short-term memory;Decreased recall of precautions    Safety/Judgement: Decreased awareness of deficits Awareness: Emergent       Extremity/Trunk Assessment   Appears WFL                        General Comments  Pt repeating himself during session    Pertinent Vitals/ Pain       Pain Assessment: No/denies pain  Home Living  lives alone. Family only available to check on him 1-2 times/day                                        Prior Functioning/Environment             Frequency Min 2X/week  Progress Toward Goals  OT Goals(current goals can now be found in the care plan section)  Progress towards OT goals: Progressing toward goals  Acute Rehab OT Goals Patient Stated Goal: Return home OT Goal Formulation: With patient/family Time For Goal Achievement: 08/24/14 Potential to Achieve Goals: Good ADL Goals Pt Will Perform Lower Body Bathing: sit to/from stand;with modified independence Pt Will Perform Lower Body Dressing: sit to/from stand;with adaptive equipment;with modified independence Pt Will Transfer to Toilet: ambulating;bedside commode;with modified independence Pt Will Perform Toileting - Clothing Manipulation and hygiene: sit to/from stand;with modified independence Pt Will Perform Tub/Shower Transfer: Tub transfer;with supervision;ambulating;shower seat;grab bars  Plan Discharge plan needs  to be updated    Co-evaluation                 End of Session Equipment Utilized During Treatment: Rolling walker   Activity Tolerance Patient tolerated treatment well   Patient Left in chair;with call bell/phone within reach;with chair alarm set;with family/visitor present   Nurse Communication Mobility status;Other (comment) (D/C situation)        Time: 2111-7356 OT Time Calculation (min): 36 min  Charges: OT General Charges $OT Visit: 1 Procedure OT Treatments $Self Care/Home Management : 23-37 mins  Terika Pillard,HILLARY 08/19/2014, 12:38 PM   [facility], OTR/L  [phone] 08/19/2014

## 2014-08-19 NOTE — Evaluation (Signed)
Speech Language Pathology Evaluation Patient Details Name: Christopher [name] MRN: [id number] DOB: 1924/07/22 Today's Date: 08/19/2014 Time: 3154-0086 SLP Time Calculation (min) (ACUTE ONLY): 15 min  Problem List:  Patient Active Problem List   Diagnosis Date Noted  . Acute ischemic left MCA stroke 08/16/2014  . Prediabetes 04/16/2014  . Dysphagia, unspecified(787.20) 09/05/2013  . Onychomycosis 07/13/2013  . Late effects of CVA (cerebrovascular accident) 06/21/2013  . Gait instability 06/21/2013  . CVA (cerebral infarction) 06/21/2013  . Fall 06/12/2013  . Traumatic hemopneumothorax 06/12/2013  . Medicare annual wellness visit, subsequent 02/16/2013  . Insomnia 02/16/2013  . Constipation 02/16/2013  . DOE (dyspnea on exertion) 01/17/2013  . 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 Palpitations 06/13/2012  . MDD (major depressive disorder), single episode 06/13/2012  . Second degree AV block 04/17/2012  . Compression fracture 04/02/2012  . HIATAL HERNIA 10/27/2007  . DIVERTICULOSIS, COLON 05/01/2007  . HLD (hyperlipidemia) 01/06/2007  . Essential hypertension 01/06/2007  . GERD 01/06/2007  . OSTEOPOROSIS 01/06/2007  . ROSACEA 01/05/2007  . PROSTATE SPECIFIC ANTIGEN, ELEVATED 01/05/2007   Past Medical History:  Past Medical History  Diagnosis Date  . GERD (gastroesophageal reflux disease) 08/1996    with large HH and partially intrathoracic stomach  . Hyperlipemia 08/1986  . Hypertension Pre 1996  . Osteoporosis 2006    dexa T -2.8 (05/2012), T11 compression fx  . Pneumonia 11 yoa  . History of Doppler echocardiogram 05/1995    MVP, MOD AI, MILD TR, MILD MR  . Elevated PSA     decided against further eval  . Diverticulosis   . Bilateral sensorineural hearing loss 02/2012    some conductive left side  . Compression fracture     multiple  . AV block, 2nd degree 04/2012    seen cards, monitor for now, pt does not desire monitor/pacer  . BCC (basal cell carcinoma of  skin) 08/2012    s/p excision Allyson Sabal)  . HOH (hard of hearing)   . Depression   . Chronic kidney disease     elevated PSA-no tx  . CVA (cerebral infarction) 04/20/2012    04/16/12 IMPRESSION:  1. 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 No mass effect or hemorrhage. Basilar artery perforator territory.  2. See MRA findings below.  3. Otherwise stable MRI appearance of the brain since 2011 including extensive perivascular spaces (etat crible appearance)  . Fall at home 06/12/2013    multiple rib fractures; traumatic hemopneumothorax/notes 06/12/2013 (06/14/2013)  . Hiatal hernia 04/2014    mod-large by CXR   Past Surgical History:  Past Surgical History  Procedure Laterality Date  . Cholecystectomy  05/1995  . Exercise treadmill  09/12/2006    NML  . Esophagogastroduodenoscopy  08/18/2004    H. H. Dr. Fuller Plan  . Colonoscopy  05/01/2007    divericulosis, int hemorrhoids - Dr. Fuller Plan  . Dexa  05/2012    T score -2.8 hip, T11 compression fx  . Skin full thickness graft Left 10/19/2012    Procedure: REPAIR OF LEFT EAR WITH TISSUE REARRANGEMENT SKIN GRAFT FULL THICKNESS;  Surgeon: Theodoro Kos, DO;  Location: [place];  Service: Plastics;  Laterality: Left;  . US echocardiography  05/2013    Mod LVH, EF 60-65%, indeterminate diastolic fxn, aortic sclerosis without stenosis, mildly dilated RV with nl sys fxn, mod dilated arch  . Inguinal hernia repair Left 08/14/2010    Dr. Hassell Done  . Cataract extraction w/ intraocular lens  implant,  bilateral Bilateral    HPI:  81 y.o. male with h/o stroke, pna, GERD, hearing loss, chronic kidney disease lives alone and ambulates at baseline.Pt admitted with speech difficulty and confusion. MRI revealed 9 x 18 mm LEFT temporal solid enhancing mass, LEFT mesial occipital lobe 14 x 20 mm cystic enhancing mass. Findings concerning for multifocal glioblastoma multiform, with possible surrounding it non enhancing infiltrative  tumor and/or vasogenic edema. This could reflect metastasis, though metastasis typically have more mass effect.    Assessment / Plan / Recommendation Clinical Impression  Pt has a history of memory impairments. Family present and reports his speech to be "slower" and comprehension decreased "a little". SLP assessment revealed difficutly in working and anticipatory memory.  Verbal problem solving for hospital siturations was functional; suspect difficulty in functional situations. He requires increased time to process information heard. Pt would benefit from ST to facilitate cognition.    SLP Assessment  Patient needs continued Speech Lanaguage Pathology Services    Follow Up Recommendations  Home health SLP    Frequency and Duration min 2x/week  2 weeks   Pertinent Vitals/Pain Pain Assessment: No/denies pain   SLP Goals  Potential to Achieve Goals (ACUTE ONLY):  (fair-good) Potential Considerations (ACUTE ONLY): Previous level of function  SLP Evaluation Prior Functioning  Cognitive/Linguistic Baseline: Baseline deficits Baseline deficit details:  (memory deficits) Type of Home: House  Lives With: Alone   Cognition  Overall Cognitive Status: Impaired/Different from baseline Arousal/Alertness: Awake/alert Orientation Level: Oriented to person;Oriented to place;Oriented to time;Disoriented to situation Attention: Sustained Sustained Attention: Appears intact Memory: Impaired Memory Impairment: Prospective memory;Decreased short term memory;Retrieval deficit;Storage deficit;Decreased recall of new information Awareness: Appears intact Problem Solving: Appears intact Safety/Judgment: Appears intact    Comprehension  Auditory Comprehension Overall Auditory Comprehension: Appears within functional limits for tasks assessed Yes/No Questions: Within Functional Limits Commands: Within Functional Limits Conversation: Simple Visual Recognition/Discrimination Discrimination: Not  tested Reading Comprehension Reading Status:  (TBA)    Expression Expression Primary Mode of Expression: Verbal Verbal Expression Overall Verbal Expression: Appears within functional limits for tasks assessed Initiation: No impairment Level of Generative/Spontaneous Verbalization: Conversation Repetition: No impairment Naming: Not tested Pragmatics: No impairment Written Expression Dominant Hand: Right Written Expression:  (TBA)   Oral / Motor Oral Motor/Sensory Function Overall Oral Motor/Sensory Function: Appears within functional limits for tasks assessed Motor Speech Overall Motor Speech: Appears within functional limits for tasks assessed Respiration: Within functional limits Phonation: Normal Resonance: Within functional limits Articulation: Within functional limitis Intelligibility: Intelligible Motor Planning: Witnin functional limits   GO     Houston Siren 08/19/2014, 2:38 PM  Orbie Pyo Marvin Grabill M.Ed [employer] [phone]

## 2014-08-19 NOTE — Progress Notes (Signed)
Patient ID: Christopher [name], male   DOB: July 05, 1924, 81 y.o.   MRN: [id number] Subjective:  The patient is alert and pleasant. He looks well. He is in no apparent distress. Evidently someone told him he was not safe to go home for reasons unclear to me.  Objective: Vital signs in last 24 hours: Temp:  [97.3 F (36.3 C)-98.2 F (36.8 C)] 97.7 F (36.5 C) (01/11 1325) Pulse Rate:  [51-65] 65 (01/11 1325) Resp:  [16-18] 16 (01/11 1325) BP: (129-163)/(69-87) 141/78 mmHg (01/11 1325) SpO2:  [95 %-97 %] 97 % (01/11 1325)  Intake/Output from previous day: 01/10 0701 - 01/11 0700 In: 900 [P.O.:900] Out: -  Intake/Output this shift: Total I/O In: 480 [P.O.:480] Out: -   Physical exam the patient is alert and oriented 3. His speech is normal. His strength is normal.  Lab Results:  Recent Labs  08/18/14 0652 08/19/14 0608  WBC 8.8 13.2*  HGB 12.8* 12.5*  HCT 38.2* 37.6*  PLT 181 181   BMET  Recent Labs  08/18/14 0652 08/19/14 0608  NA 139 138  K 4.0 3.5  CL 106 109  CO2 29 21  GLUCOSE 144* 138*  BUN 20 26*  CREATININE 1.14 1.06  CALCIUM 9.3 8.8    Studies/Results: Ct Chest W Contrast  08/17/2014   CLINICAL DATA:  81 year old male with metastatic disease to the brain. Evaluate for primary malignancy. Patient does have a history of malignant melanoma.  EXAM: CT CHEST, ABDOMEN, AND PELVIS WITH CONTRAST  TECHNIQUE: Multidetector CT imaging of the chest, abdomen and pelvis was performed following the standard protocol during bolus administration of intravenous contrast.  CONTRAST:  187mL OMNIPAQUE IOHEXOL 300 MG/ML  SOLN  COMPARISON:  Chest CT 06/12/2013. CT of the abdomen and pelvis 04/02/2012.  FINDINGS: CT CHEST FINDINGS  Mediastinum/Lymph Nodes: Heart size is mildly enlarged. There is no significant pericardial fluid, thickening or pericardial calcification. There is atherosclerosis of the thoracic aorta, the great vessels of the mediastinum and the coronary arteries, including  calcified atherosclerotic plaque in the left main, left anterior descending, left circumflex and right coronary arteries. Dilatation of the distal ascending thoracic aorta which measures up to 4.9 cm in diameter. Large hiatal hernia with nearly completely intrathoracic stomach. No pathologically enlarged mediastinal, hilar or axillary lymph nodes. Severe calcifications of the aortic valve. Mild calcifications of the inferior mitral annulus.  Lungs/Pleura: Areas of scarring in the inferior segment of the lingula and in the basal segments of the left lower lobe, unchanged compared to prior study 06/12/2013. No definite suspicious appearing pulmonary nodules or masses. No acute consolidative airspace disease. No pleural effusions. Mild linear scarring is also noted in the right lower lobe.  Musculoskeletal/Soft Tissues: Multiple vertebral body compression fractures again noted, most notable at T7 and T11 (at T11 there is 90% loss of central vertebral body height), which are unchanged. There are no aggressive appearing lytic or blastic lesions noted in the visualized portions of the skeleton. Multiple old healed right-sided rib fractures.  CT ABDOMEN AND PELVIS FINDINGS  Hepatobiliary: 1.4 cm low-attenuation lesion in segment 2 of the liver is unchanged, compatible with a small simple cyst. No other suspicious appearing hepatic lesions are noted. Partially calcified lesion adjacent to segment 7, most compatible with some retroperitoneal fat necrosis (unchanged). Status post cholecystectomy. Minimal intrahepatic biliary ductal dilatation, and mild dilatation of the common bowel duct (12 mm in the porta hepatis), unchanged compared to the prior study from 2013, presumably related to a postcholecystectomy  physiology.  Pancreas: Unremarkable.  Spleen: Unremarkable.  Adrenals/Urinary Tract: Small low-attenuation lesions in the kidneys bilaterally, the majority of which are too small to definitively characterize, but are  unchanged compared to the prior examination, favored to represent tiny cysts. Largest lesion measures 2.2 cm in the interpolar region of the right kidney, compatible with a simple cyst. No hydroureteronephrosis. Small bladder wall diverticulae bilaterally, largest of which is on the left measuring 1.5 cm (similar to the prior study). Bilateral adrenal glands are normal in appearance.  Stomach/Bowel: As discussed above the stomach is essentially nearly completely intra thoracic. Stomach is otherwise unremarkable in appearance. No pathologic dilatation of small bowel or colon. Numerous colonic diverticulae are present, particularly in the descending colon and sigmoid colon, without surrounding inflammatory changes to suggest an acute diverticulitis at this time.  Vascular/Lymphatic: Atherosclerosis throughout the abdominal and pelvic vasculature, without evidence of aneurysm or dissection. No lymphadenopathy noted in the abdomen or pelvis.  Reproductive: Prostate gland is markedly enlarged measuring up to 6.7 x 5.1 cm. Seminal vesicles are unremarkable in appearance.  Other: No significant volume of ascites.  No pneumoperitoneum.  Musculoskeletal: Old compression fractures at L1 and L2, similar to prior examination, with greater than 90% loss of central vertebral body height at L1. Old healed fractures of the right superior and inferior pubic rami. There are no aggressive appearing lytic or blastic lesions noted in the visualized portions of the skeleton.  IMPRESSION: 1. No definite primary malignancy identified in the chest, abdomen or pelvis. 2. No acute findings noted in the chest, abdomen or pelvis. 3. Extensive atherosclerosis, including left main and 3 vessel coronary artery disease. In addition, there is aneurysmal dilatation of the ascending thoracic aorta which measures up to 4.9 cm in diameter (previously the 4.8 cm in diameter on 06/12/2013). Ascending thoracic aortic aneurysm. Recommend semi-annual imaging  followup by CTA or MRA and referral to cardiothoracic surgery if not already obtained. This recommendation follows 2010 ACCF/AHA/AATS/ACR/ASA/SCA/SCAI/SIR/STS/SVM Guidelines for the Diagnosis and Management of Patients With Thoracic Aortic Disease. Circulation. 2010; 121: U314-H702. 4. Massive hiatal hernia with nearly completely intrathoracic stomach, unchanged compared to prior examinations. 5. Prostatomegaly. 6. Additional incidental findings, similar to prior examinations, as above.   Electronically Signed   By: Vinnie Langton M.D.   On: 08/17/2014 18:12   Ct Abdomen Pelvis W Contrast  08/17/2014   CLINICAL DATA:  80 year old male with metastatic disease to the brain. Evaluate for primary malignancy. Patient does have a history of malignant melanoma.  EXAM: CT CHEST, ABDOMEN, AND PELVIS WITH CONTRAST  TECHNIQUE: Multidetector CT imaging of the chest, abdomen and pelvis was performed following the standard protocol during bolus administration of intravenous contrast.  CONTRAST:  111mL OMNIPAQUE IOHEXOL 300 MG/ML  SOLN  COMPARISON:  Chest CT 06/12/2013. CT of the abdomen and pelvis 04/02/2012.  FINDINGS: CT CHEST FINDINGS  Mediastinum/Lymph Nodes: Heart size is mildly enlarged. There is no significant pericardial fluid, thickening or pericardial calcification. There is atherosclerosis of the thoracic aorta, the great vessels of the mediastinum and the coronary arteries, including calcified atherosclerotic plaque in the left main, left anterior descending, left circumflex and right coronary arteries. Dilatation of the distal ascending thoracic aorta which measures up to 4.9 cm in diameter. Large hiatal hernia with nearly completely intrathoracic stomach. No pathologically enlarged mediastinal, hilar or axillary lymph nodes. Severe calcifications of the aortic valve. Mild calcifications of the inferior mitral annulus.  Lungs/Pleura: Areas of scarring in the inferior segment of the lingula and in  the basal  segments of the left lower lobe, unchanged compared to prior study 06/12/2013. No definite suspicious appearing pulmonary nodules or masses. No acute consolidative airspace disease. No pleural effusions. Mild linear scarring is also noted in the right lower lobe.  Musculoskeletal/Soft Tissues: Multiple vertebral body compression fractures again noted, most notable at T7 and T11 (at T11 there is 90% loss of central vertebral body height), which are unchanged. There are no aggressive appearing lytic or blastic lesions noted in the visualized portions of the skeleton. Multiple old healed right-sided rib fractures.  CT ABDOMEN AND PELVIS FINDINGS  Hepatobiliary: 1.4 cm low-attenuation lesion in segment 2 of the liver is unchanged, compatible with a small simple cyst. No other suspicious appearing hepatic lesions are noted. Partially calcified lesion adjacent to segment 7, most compatible with some retroperitoneal fat necrosis (unchanged). Status post cholecystectomy. Minimal intrahepatic biliary ductal dilatation, and mild dilatation of the common bowel duct (12 mm in the porta hepatis), unchanged compared to the prior study from 2013, presumably related to a postcholecystectomy physiology.  Pancreas: Unremarkable.  Spleen: Unremarkable.  Adrenals/Urinary Tract: Small low-attenuation lesions in the kidneys bilaterally, the majority of which are too small to definitively characterize, but are unchanged compared to the prior examination, favored to represent tiny cysts. Largest lesion measures 2.2 cm in the interpolar region of the right kidney, compatible with a simple cyst. No hydroureteronephrosis. Small bladder wall diverticulae bilaterally, largest of which is on the left measuring 1.5 cm (similar to the prior study). Bilateral adrenal glands are normal in appearance.  Stomach/Bowel: As discussed above the stomach is essentially nearly completely intra thoracic. Stomach is otherwise unremarkable in appearance. No  pathologic dilatation of small bowel or colon. Numerous colonic diverticulae are present, particularly in the descending colon and sigmoid colon, without surrounding inflammatory changes to suggest an acute diverticulitis at this time.  Vascular/Lymphatic: Atherosclerosis throughout the abdominal and pelvic vasculature, without evidence of aneurysm or dissection. No lymphadenopathy noted in the abdomen or pelvis.  Reproductive: Prostate gland is markedly enlarged measuring up to 6.7 x 5.1 cm. Seminal vesicles are unremarkable in appearance.  Other: No significant volume of ascites.  No pneumoperitoneum.  Musculoskeletal: Old compression fractures at L1 and L2, similar to prior examination, with greater than 90% loss of central vertebral body height at L1. Old healed fractures of the right superior and inferior pubic rami. There are no aggressive appearing lytic or blastic lesions noted in the visualized portions of the skeleton.  IMPRESSION: 1. No definite primary malignancy identified in the chest, abdomen or pelvis. 2. No acute findings noted in the chest, abdomen or pelvis. 3. Extensive atherosclerosis, including left main and 3 vessel coronary artery disease. In addition, there is aneurysmal dilatation of the ascending thoracic aorta which measures up to 4.9 cm in diameter (previously the 4.8 cm in diameter on 06/12/2013). Ascending thoracic aortic aneurysm. Recommend semi-annual imaging followup by CTA or MRA and referral to cardiothoracic surgery if not already obtained. This recommendation follows 2010 ACCF/AHA/AATS/ACR/ASA/SCA/SCAI/SIR/STS/SVM Guidelines for the Diagnosis and Management of Patients With Thoracic Aortic Disease. Circulation. 2010; 121: B151-V616. 4. Massive hiatal hernia with nearly completely intrathoracic stomach, unchanged compared to prior examinations. 5. Prostatomegaly. 6. Additional incidental findings, similar to prior examinations, as above.   Electronically Signed   By: Vinnie Langton M.D.   On: 08/17/2014 18:12    Assessment/Plan: Left brain tumors: I have again discussed the situation with the patient and his daughter. We have discussed the various treatment options  including doing nothing, stereotactic brain biopsy, craniotomy for open biopsy/resection of left temporal lesion. I have answered all their questions. They want to proceed with a craniotomy for resection of the left anterior temporal lesion. The surgery is tentatively planned for 08/29/2014. The patient is okay for discharge from my point of view. He should continue Decadron 2 mg twice a day and Keppra 500 mg by mouth twice a day. My office will contact him regarding the details of surgery.  LOS: 3 days     Rayn Enderson D 08/19/2014, 3:49 PM

## 2014-08-19 NOTE — Progress Notes (Signed)
Physical Therapy Treatment Patient Details Name: Christopher [name] MRN: [id number] DOB: 1924/01/13 Today's Date: 08/19/2014    History of Present Illness 81 yo male adm 08/16/14 with facial droop and garbled speech. MRI + brain tumors Lt temporal and occipital lobes. CT abd/pelvis/chest negative for other tumors and appears brain tumors are primary cancer. Neurosurgery and oncology consulted, with ?plan for brain biopsy. Hx of prior CVA 2011 and hx of falls. Dementia    PT Comments    Patient without signs of impulsivity. Poor coordination/use of RW due to drifts to his left with near entanglement/trip over Rt posterior leg of RW. May do better with his cane (overall steady, however does drift to his Left). Daughter reports he uses either his cane or RW inside (and rollator when outside).    Follow Up Recommendations  Other (comment) (TBD after surgery vs biopsy)     Equipment Recommendations  None recommended by PT    Recommendations for Other Services OT consult     Precautions / Restrictions Precautions Precautions: Fall Restrictions Weight Bearing Restrictions: No    Mobility  Bed Mobility               General bed mobility comments: pt up in chair  Transfers Overall transfer level: Needs assistance Equipment used: Rolling walker (2 wheeled) Transfers: Sit to/from Stand Sit to Stand: Min guard         General transfer comment: assist for safety; vc for safe use of RW  Ambulation/Gait Ambulation/Gait assistance: Min assist Ambulation Distance (Feet): 120 Feet Assistive device: Rolling walker (2 wheeled) Gait Pattern/deviations: Step-through pattern;Drifts right/left Gait velocity: decr   General Gait Details: RW drifts to the left (as pt does) however pt ends up close to Rt side of RW with near trip over Rt back leg of RW; assist to correct; bil UE strength nearly equal (?Rt triceps slightly weaker, however does not explain pt pushing RW to his Lt)   Marine scientist Rankin (Stroke Patients Only) Modified Rankin (Stroke Patients Only) Pre-Morbid Rankin Score: Slight disability (per daughter's description; not out in community) Modified Rankin: Moderately severe disability     Balance Overall balance assessment: Needs assistance         Standing balance support: Single extremity supported Standing balance-Leahy Scale: Poor                 High Level Balance Comments: standing activities: reaching overhead, forward reach LUE supported with RUE 5"; toe and heel raises x 10 with bil UE on RW; marching, hip abdct x5 each; vc for posture and to lighten UE support, however no loss of balance    Cognition Arousal/Alertness: Awake/alert Behavior During Therapy: WFL for tasks assessed/performed Overall Cognitive Status: History of cognitive impairments - at baseline Area of Impairment: Memory     Memory: Decreased short-term memory         General Comments: no signs of impulsivity; aware of his need for RW due to decr balance    Exercises      General Comments General comments (skin integrity, edema, etc.): Daughter present      Pertinent Vitals/Pain Pain Assessment: No/denies pain    Home Living                      Prior Function            PT Goals (current  goals can now be found in the care plan section) Acute Rehab PT Goals Patient Stated Goal: Return home Progress towards PT goals: Progressing toward goals    Frequency  Min 4X/week    PT Plan Other (comment) (TBD after surgery vs biopsy)    Co-evaluation             End of Session Equipment Utilized During Treatment: Gait belt Activity Tolerance: Patient tolerated treatment well Patient left: in chair;with call bell/phone within reach;with chair alarm set;with family/visitor present     Time: 9509-3267 PT Time Calculation (min) (ACUTE ONLY): 33 min  Charges:  $Gait Training: 8-22  mins $Therapeutic Activity: 8-22 mins                    G Codes:      Alfonza Toft 27-Aug-2014, 10:02 AM Pager [phone]

## 2014-08-20 MED ORDER — DEXAMETHASONE 2 MG PO TABS: 2.0000 mg | ORAL_TABLET | Freq: Two times a day (BID) | ORAL | Status: DC

## 2014-08-20 MED ORDER — LEVETIRACETAM 500 MG PO TABS: 500.0000 mg | ORAL_TABLET | Freq: Two times a day (BID) | ORAL | Status: DC

## 2014-08-20 NOTE — Discharge Instructions (Signed)
Follow with Primary MD Ria Bush, MD in 7 days   Get CBC, CMP, 2 view Chest X ray checked  by Primary MD next visit.    Activity: As tolerated with Full fall precautions use walker/cane & assistance as needed   Disposition SNF   Diet: Dysphagia 3 with thin liquid  , with feeding assistance and aspiration precautions as needed.  For Heart failure patients - Check your Weight same time everyday, if you gain over 2 pounds, or you develop in leg swelling, experience more shortness of breath or chest pain, call your Primary MD immediately. Follow Cardiac Low Salt Diet and 1.8 lit/day fluid restriction.   On your next visit with your primary care physician please Get Medicines reviewed and adjusted.   Please request your Prim.MD to go over all Hospital Tests and Procedure/Radiological results at the follow up, please get all Hospital records sent to your Prim MD by signing hospital release before you go home.   If you experience worsening of your admission symptoms, develop shortness of breath, life threatening emergency, suicidal or homicidal thoughts you must seek medical attention immediately by calling 911 or calling your MD immediately  if symptoms less severe.  You Must read complete instructions/literature along with all the possible adverse reactions/side effects for all the Medicines you take and that have been prescribed to you. Take any new Medicines after you have completely understood and accpet all the possible adverse reactions/side effects.   Do not drive, operating heavy machinery, perform activities at heights, swimming or participation in water activities or provide baby sitting services if your were admitted for syncope or siezures until you have seen by Primary MD or a Neurologist and advised to do so again.  Do not drive when taking Pain medications.    Do not take more than prescribed Pain, Sleep and Anxiety Medications  Special Instructions: If you have  smoked or chewed Tobacco  in the last 2 yrs please stop smoking, stop any regular Alcohol  and or any Recreational drug use.  Wear Seat belts while driving.   Please note  You were cared for by a hospitalist during your hospital stay. If you have any questions about your discharge medications or the care you received while you were in the hospital after you are discharged, you can call the unit and asked to speak with the hospitalist on call if the hospitalist that took care of you is not available. Once you are discharged, your primary care physician will handle any further medical issues. Please note that NO REFILLS for any discharge medications will be authorized once you are discharged, as it is imperative that you return to your primary care physician (or establish a relationship with a primary care physician if you do not have one) for your aftercare needs so that they can reassess your need for medications and monitor your lab values.

## 2014-08-20 NOTE — Clinical Social Work Psychosocial (Signed)
Clinical Social Work Department BRIEF PSYCHOSOCIAL ASSESSMENT 08/20/2014  Patient:  Christopher [name], Christopher [name]     Account Number:  [id number]     Admit date:  08/16/2014  Clinical Social Worker:  Marciano Sequin  Date/Time:  08/20/2014 12:31 PM  Referred by:  RN  Date Referred:  08/20/2014 Referred for  SNF Placement   Other Referral:   Interview type:  Patient Other interview type:   Duard Brady    PSYCHOSOCIAL DATA Living Status:  ALONE Admitted from facility:   Level of care:   Primary support name:  Lewis,[name] Primary support relationship to patient:  CHILD, ADULT Degree of support available:   Strong Support    CURRENT CONCERNS Current Concerns  None Noted   Other Concerns:    SOCIAL WORK ASSESSMENT / PLAN CSW met pt and his daughter Vaughan Basta at bedside. CSW introduced self and purpose of visit. CSW and the pt discussed the clinical team recommendations for rehab. CSW and Vaughan Basta discussed the geographic area in which the pt is interest in receiving rehab. The pt expressed interested in [facility]. [facility] could not offer a bed prior to the pt receiving a biopsy. CSW explained this to [facility]. Vaughan Basta informed the rest of the family. Vaughan Basta and the family decided to transition to the pt to [place]. CSW and Vaughan Basta discussed ambulance transport to [place] today. CSW uploaded the discharge summary. Bedside RN can call reported to [phone].   Assessment/plan status:  Psychosocial Support/Ongoing Assessment of Needs Other assessment/ plan:   Information/referral to community resources:    PATIENT'S/FAMILY'S RESPONSE TO PLAN OF CARE: Vaughan Basta was receptive to allowing the pt to transition to [place]. Vaughan Basta agreed with the pt's plan of care.   [place], MSW, [place]

## 2014-08-20 NOTE — Clinical Social Work Placement (Signed)
Clinical Social Work Department CLINICAL SOCIAL WORK PLACEMENT NOTE 08/20/2014  Patient:  Christopher [name], Christopher [name]  Account Number:  [id number] Admit date:  08/16/2014  Clinical Social Worker:  Paulette Blanch Devan Danzer, LCSWA  Date/time:  08/20/2014 01:03 PM  Clinical Social Work is seeking post-discharge placement for this patient at the following level of care:   SKILLED NURSING   (*CSW will update this form in Epic as items are completed)   08/20/2014  Patient/family provided with [facility] Department of Clinical Social Work's list of facilities offering this level of care within the geographic area requested by the patient (or if unable, by the patient's family).  08/20/2014  Patient/family informed of their freedom to choose among providers that offer the needed level of care, that participate in Medicare, Medicaid or managed care program needed by the patient, have an available bed and are willing to accept the patient.  08/20/2014  Patient/family informed of [facility]' ownership interest in [facility], as well as of the fact that they are under no obligation to receive care at this facility.  PASARR submitted to EDS on  PASARR number received on   FL2 transmitted to all facilities in geographic area requested by pt/family on  08/20/2014 FL2 transmitted to all facilities within larger geographic area on 08/20/2014  Patient informed that his/her managed care company has contracts with or will negotiate with  certain facilities, including the following:     Patient/family informed of bed offers received:  08/20/2014 Patient chooses bed at [facility] Physician recommends and patient chooses bed at    Patient to be transferred to [place] on  08/20/2014 Patient to be transferred to facility by [facility] Patient and family notified of transfer on 08/20/2014 Name of family member notified:  Pt's daughter Christopher [name]  The following physician  request were entered in Epic:   Additional Comments:  Pt has existing [facility]

## 2014-08-20 NOTE — Progress Notes (Signed)
Speech Language Pathology Treatment: Dysphagia;Cognitive-Linquistic  Patient Details Name: Christopher [name] MRN: [id number] DOB: 1923/08/25 Today's Date: 08/20/2014 Time: 4403-4742 SLP Time Calculation (min) (ACUTE ONLY): 45 min  Assessment / Plan / Recommendation Clinical Impression  Pt sitting upright in chair with daughter Vaughan Basta in room today.  Pt and Vaughan Basta report pt with worsening dysphagia within the last few months characterized by coughing with intake (both food and drink).  At times pt reports coughing up food that does not include secretions. ? If CP bar noted on MBS 2014 may be source of dysphagia.  Pt states coughing occurs approximately two to three times a week and is not consistent with timing during meals, etc.  He reports he has not lost weight, required heimlich maneuver nor had pneumonias therefore he appears to be tolerating.    Vaughan Basta reports recently she was concerned pt would require heimlich maneuver during recent visit to restaurant.  SLP observed pt consuming pills with thin water - natural chin tuck posture noted without s/s of aspiration.  He did demonstrate mild throat clearing and hoarseness s/p swallow.   Pt uses caution with intake to decrease his symptoms.    Educated family/pt to aspiration precautions/strategies to mitigate his dysphagia.  Provided heimlich in writing to pt/daughter.  Advised to attempt chin tuck posture if decreases cough and increases comfort with po.  Also educated daughter and pt to findings of esophagram 10/2013 and previous MBS study 2014.    Reviewed strategies for memory compensation (pt reports some memory deficits PTA) including having pt ask for his own medication at SNF to maximize his independence as his goal is to return home.     Please note, Daughter Vaughan Basta and pt report hoarseness during this admit and he suspects he may be getting a cold.  Advised them to inform RN.    Recommend follow up at SNF for dysphagia, dysarthria and cognitive  management/compensation.       HPI HPI: 81 y.o. male with h/o stroke, pna, GERD, hearing loss, chronic kidney disease lives alone and ambulates at baseline.Pt admitted with speech difficulty and confusion. MRI revealed 9 x 18 mm LEFT temporal solid enhancing mass, LEFT mesial occipital lobe 14 x 20 mm cystic enhancing mass. 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 This could reflect metastasis, though metastasis typically have more mass effect.   Pt has been seen for speech/swallowing.  Has h/o dysphagia from previous brain stem cva.     Pertinent Vitals Pain Assessment: No/denies pain  SLP Plan  Continue with current plan of care    Recommendations Diet recommendations: Dysphagia 3 (mechanical soft);Thin liquid Liquids provided via: Cup;Straw Medication Administration: Whole meds with puree Supervision: Patient able to self feed;Intermittent supervision to cue for compensatory strategies Compensations: Small sips/bites;Slow rate (advised chin tuck if decreases coughing/choking episodes) Postural Changes and/or Swallow Maneuvers: Seated upright 90 degrees;Upright 30-60 min after meal              Oral Care Recommendations: Oral care BID Follow up Recommendations: Skilled Nursing facility Plan: Continue with current plan of care    [place], [facility] SLP [phone]

## 2014-08-20 NOTE — Discharge Summary (Addendum)
Christopher [name], 81 y.o., DOB 06/29/1924, MRN [id number]. Admission date: 08/16/2014 Discharge Date 08/21/2014 Primary MD Ria Bush, MD Admitting Physician Etta Quill, DO  Admission Diagnosis  Stroke [I63.9]  Discharge Diagnosis   Principal Problem:   Mass of brain Active Problems:   HLD (hyperlipidemia)   Essential hypertension   CVA (cerebral infarction)    PCP please follow: - Patient is to have brain biopsy on 08/22/14, by neurosurgery, Plavix has been held on discharge, please follow the plan of care, and decision if is appropriate to resume or to continue to hold Plavix. - Patient will need to follow with oncology service after biopsy is done.  Past Medical History  Diagnosis Date  . GERD (gastroesophageal reflux disease) 08/1996    with large HH and partially intrathoracic stomach  . Hyperlipemia 08/1986  . Hypertension Pre 1996  . Osteoporosis 2006    dexa T -2.8 (05/2012), T11 compression fx  . Pneumonia 61 yoa  . History of Doppler echocardiogram 05/1995    MVP, MOD AI, MILD TR, MILD MR  . Elevated PSA     decided against further eval  . Diverticulosis   . Bilateral sensorineural hearing loss 02/2012    some conductive left side  . Compression fracture     multiple  . AV block, 2nd degree 04/2012    seen cards, monitor for now, pt does not desire monitor/pacer  . BCC (basal cell carcinoma of skin) 08/2012    s/p excision Allyson Sabal)  . HOH (hard of hearing)   . Depression   . Chronic kidney disease     elevated PSA-no tx  . CVA (cerebral infarction) 04/20/2012    04/16/12 IMPRESSION:  1. 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 No mass effect or hemorrhage. Basilar artery perforator territory.  2. See MRA findings below.  3. Otherwise stable MRI appearance of the brain since 2011 including extensive perivascular spaces (etat crible appearance)  . Fall at home 06/12/2013    multiple rib fractures; traumatic hemopneumothorax/notes 06/12/2013  (06/14/2013)  . Hiatal hernia 04/2014    mod-large by CXR    Past Surgical History  Procedure Laterality Date  . Cholecystectomy  05/1995  . Exercise treadmill  09/12/2006    NML  . Esophagogastroduodenoscopy  08/18/2004    H. H. Dr. Fuller Plan  . Colonoscopy  05/01/2007    divericulosis, int hemorrhoids - Dr. Fuller Plan  . Dexa  05/2012    T score -2.8 hip, T11 compression fx  . Skin full thickness graft Left 10/19/2012    Procedure: REPAIR OF LEFT EAR WITH TISSUE REARRANGEMENT SKIN GRAFT FULL THICKNESS;  Surgeon: Theodoro Kos, DO;  Location: [facility];  Service: Plastics;  Laterality: Left;  . US echocardiography  05/2013    Mod LVH, EF 60-65%, indeterminate diastolic fxn, aortic sclerosis without stenosis, mildly dilated RV with nl sys fxn, mod dilated arch  . Inguinal hernia repair Left 08/14/2010    Dr. Hassell Done  . Cataract extraction w/ intraocular lens  implant, bilateral Bilateral      Hospital Course See H&P, Labs, Consult and Test reports for all details in brief, patient was admitted for   Principal Problem:   Mass of brain Active Problems:   HLD (hyperlipidemia)   Essential hypertension   CVA (cerebral infarction)  Admission history of present illness/brief narrative: Christopher [name] is a 81 y.o. male with h/o stroke, lives alone and ambulates at baseline. LKW at 10:30 when he spoke with  daughter this morning. When son called around mid day did not answer phone. Called again later this afternoon, no answer. When they went to check on him found patient on side of bed "incoherent". EMS called and patient brought in for evaluation. Stroke code was called, and patient had CT head showing acute versus subacute infarction of the medial aspect of the left temporal, thought initially to be a stroke, but MRI was done which did show left temporal enhancing solid mass and left occipital lobe enhancing cystic mass, suspicion for multifocal glioblastoma, normal MRA of  intracranial vessels. Neurosurgery were consulted, as well oncology service. CT chest abdomen pelvis was done, did not show any evidence of primary malignancy indicating the possibility of primary brain tumor, multiple options discussed by neurosurgery with the family, and decision was made for CT-guided brain biopsy this coming Thursday as an outpatient , patient is being discharged to skilled nursing facility today, with the plan to follow-up with neurosurgery and oncology as an outpatient.  Abnormal neurological finding with slurred speech - This is most likely related to brain tumor, suspicion for multifocal glioblastoma by radiology reading,  - CT chest abdomen pelvis does not show any evidence of metastases. - on Keppra for seizure prophylaxis - on Decadron secondary to brain tumor - Stopped Plavix for brain biopsy. - Neurosurgery consult appreciated, plan is for biopsy as an outpatient, told by neurosurgery this coming Thursday. - Oncology consult appreciated. Patient will need to follow with oncology after biopsy .  Hypertension - on lisinopril  Hyperlipidemia - Continue with statin  History of CVA - Will hold Plavix currently, as going for brain biopsy. - Decision resume Plavix will depend on biopsy results, as unclear if we'll need surgery on.  Consults  Neurology Neurosurgery Oncology  Significant Tests:  See full reports for all details    Ct Head Wo Contrast  08/16/2014   CLINICAL DATA:  Could stroke, right facial droop  EXAM: CT HEAD WITHOUT CONTRAST  TECHNIQUE: Contiguous axial images were obtained from the base of the skull through the vertex without intravenous contrast.  COMPARISON:  Head CT 06/18/2013  FINDINGS: There is new hypodensity within the medial inferior left temporal lobe (image 14, series 201). No evidence of intracranial hemorrhage. There is generalized cortical atrophy. There is periventricular white matter hypodensities not changed from prior.  Paranasal sinuses and mastoid air cells are clear. There is polypoid mucosal thickening in the right maxillary sinus.  IMPRESSION: 1. Acute versus subacute infarction in the medial aspect of the left temporal lobe operculum. 2. No intracranial hemorrhage identified. 3. Atrophy and microvascular disease again demonstrated. [facility] 08/16/2014  at19:40.   Electronically Signed   By: Suzy Bouchard M.D.   On: 08/16/2014 19:41   Ct Chest W Contrast  08/17/2014   CLINICAL DATA:  81 year old male with metastatic disease to the brain. Evaluate for primary malignancy. Patient does have a history of malignant melanoma.  EXAM: CT CHEST, ABDOMEN, AND PELVIS WITH CONTRAST  TECHNIQUE: Multidetector CT imaging of the chest, abdomen and pelvis was performed following the standard protocol during bolus administration of intravenous contrast.  CONTRAST:  132mL OMNIPAQUE IOHEXOL 300 MG/ML  SOLN  COMPARISON:  Chest CT 06/12/2013. CT of the abdomen and pelvis 04/02/2012.  FINDINGS: CT CHEST FINDINGS  Mediastinum/Lymph Nodes: Heart size is mildly enlarged. There is no significant pericardial fluid, thickening or pericardial calcification. There is atherosclerosis of the thoracic aorta, the great vessels of the mediastinum and the coronary arteries,  including calcified atherosclerotic plaque in the left main, left anterior descending, left circumflex and right coronary arteries. Dilatation of the distal ascending thoracic aorta which measures up to 4.9 cm in diameter. Large hiatal hernia with nearly completely intrathoracic stomach. No pathologically enlarged mediastinal, hilar or axillary lymph nodes. Severe calcifications of the aortic valve. Mild calcifications of the inferior mitral annulus.  Lungs/Pleura: Areas of scarring in the inferior segment of the lingula and in the basal segments of the left lower lobe, unchanged compared to prior study 06/12/2013. No definite suspicious appearing pulmonary nodules  or masses. No acute consolidative airspace disease. No pleural effusions. Mild linear scarring is also noted in the right lower lobe.  Musculoskeletal/Soft Tissues: Multiple vertebral body compression fractures again noted, most notable at T7 and T11 (at T11 there is 90% loss of central vertebral body height), which are unchanged. There are no aggressive appearing lytic or blastic lesions noted in the visualized portions of the skeleton. Multiple old healed right-sided rib fractures.  CT ABDOMEN AND PELVIS FINDINGS  Hepatobiliary: 1.4 cm low-attenuation lesion in segment 2 of the liver is unchanged, compatible with a small simple cyst. No other suspicious appearing hepatic lesions are noted. Partially calcified lesion adjacent to segment 7, most compatible with some retroperitoneal fat necrosis (unchanged). Status post cholecystectomy. Minimal intrahepatic biliary ductal dilatation, and mild dilatation of the common bowel duct (12 mm in the porta hepatis), unchanged compared to the prior study from 2013, presumably related to a postcholecystectomy physiology.  Pancreas: Unremarkable.  Spleen: Unremarkable.  Adrenals/Urinary Tract: Small low-attenuation lesions in the kidneys bilaterally, the majority of which are too small to definitively characterize, but are unchanged compared to the prior examination, favored to represent tiny cysts. Largest lesion measures 2.2 cm in the interpolar region of the right kidney, compatible with a simple cyst. No hydroureteronephrosis. Small bladder wall diverticulae bilaterally, largest of which is on the left measuring 1.5 cm (similar to the prior study). Bilateral adrenal glands are normal in appearance.  Stomach/Bowel: As discussed above the stomach is essentially nearly completely intra thoracic. Stomach is otherwise unremarkable in appearance. No pathologic dilatation of small bowel or colon. Numerous colonic diverticulae are present, particularly in the descending colon and  sigmoid colon, without surrounding inflammatory changes to suggest an acute diverticulitis at this time.  Vascular/Lymphatic: Atherosclerosis throughout the abdominal and pelvic vasculature, without evidence of aneurysm or dissection. No lymphadenopathy noted in the abdomen or pelvis.  Reproductive: Prostate gland is markedly enlarged measuring up to 6.7 x 5.1 cm. Seminal vesicles are unremarkable in appearance.  Other: No significant volume of ascites.  No pneumoperitoneum.  Musculoskeletal: Old compression fractures at L1 and L2, similar to prior examination, with greater than 90% loss of central vertebral body height at L1. Old healed fractures of the right superior and inferior pubic rami. There are no aggressive appearing lytic or blastic lesions noted in the visualized portions of the skeleton.  IMPRESSION: 1. No definite primary malignancy identified in the chest, abdomen or pelvis. 2. No acute findings noted in the chest, abdomen or pelvis. 3. Extensive atherosclerosis, including left main and 3 vessel coronary artery disease. In addition, there is aneurysmal dilatation of the ascending thoracic aorta which measures up to 4.9 cm in diameter (previously the 4.8 cm in diameter on 06/12/2013). Ascending thoracic aortic aneurysm. Recommend semi-annual imaging followup by CTA or MRA and referral to cardiothoracic surgery if not already obtained. This recommendation follows 2010 ACCF/AHA/AATS/ACR/ASA/SCA/SCAI/SIR/STS/SVM Guidelines for the Diagnosis and Management of Patients With  Thoracic Aortic Disease. Circulation. 2010; 121: N361-W431. 4. Massive hiatal hernia with nearly completely intrathoracic stomach, unchanged compared to prior examinations. 5. Prostatomegaly. 6. Additional incidental findings, similar to prior examinations, as above.   Electronically Signed   By: Vinnie Langton M.D.   On: 08/17/2014 18:12   Mr Jeri Cos VQ Contrast  08/17/2014   CLINICAL DATA:  Confusion, speech difficulty, onset of  symptoms this afternoon.  EXAM: MRI HEAD WITHOUT CONTRAST  MRA HEAD WITHOUT CONTRAST  TECHNIQUE: Multiplanar, multiecho pulse sequences of the brain and surrounding structures were obtained without intravenous contrast. Angiographic images of the head were obtained using MRA technique without contrast.  COMPARISON:  CT of the head August 16, 2014 at 1915 hr and MRI of the head April 16, 2012  FINDINGS: MRI HEAD FINDINGS  Very faint area reduced diffusion in LEFT temporal lobe, minimal low ADC values associated with a 9 x 18 mm avidly enhancing mass within the LEFT temporal gray-white matter junction superimposed area of T2 shine through. Associated T2 bright mildly expansile signal. A second cystic 14 x 29 mm RIGHT mesial periventricular occipital lobe mass. Punctate focus of enhancement along the LEFT occipital lobe white matter. No susceptibility artifact to suggest hemorrhage. No reduced diffusion to suggest acute ischemia.  Ventricles and sulci are otherwise normal for patient's age. No midline shift. Innumerable tiny T2 hyperintensities within the basal ganglia and thalamus are relatively unchanged. Patchy supratentorial white matter changes exclusive of the aforementioned abnormality suggest chronic small vessel ischemic disease.  No abnormal extra-axial fluid collections, extra-axial masses or abnormal extra-axial enhancement.  Status post bilateral ocular lens implants. Mild paranasal sinus mucosal thickening without air-fluid levels. Minimal LEFT mastoid effusion. No suspicious calvarial bone marrow signal. No cerebellar tonsillar ectopia. No abnormal sellar expansion.  MRA HEAD FINDINGS  Anterior circulation: Normal flow related enhancement of the cervical, petrous, cavernous and supra clinoid internal carotid arteries. Normal flow related enhancement anterior and middle cerebral arteries.  Posterior circulation: Codominant vertebral arteries, normal flow related enhancement the vertebral arteries,  vertebrobasilar junction, basilar artery main branch vessels. Normal flow related enhancement posterior cerebral arteries.  No large vessel occlusion, aneurysm, high-grade stenosis, suspicious luminal irregularity within the anterior nor posterior circulation.  IMPRESSION: MRI HEAD:  No acute ischemia.  9 x 18 mm LEFT temporal solid enhancing mass, LEFT mesial occipital lobe 14 x 20 mm cystic enhancing mass, punctate of additional enhancement within the LEFT occipital lobe, with surrounding nonenhancing mildly expansile FLAIR T2 hyperintense signal. 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 This could reflect metastasis, though metastasis typically have more mass effect.  Similar basal ganglia and thalamus perivascular spaces and/or remote lacunar infarcts. Mild white matter changes suggest chronic small vessel ischemic disease, similar.  MRA HEAD:  Normal MRA of the intracranial vessels.   Electronically Signed   By: Elon Alas   On: 08/17/2014 00:30   Ct Abdomen Pelvis W Contrast  08/17/2014   CLINICAL DATA:  81 year old male with metastatic disease to the brain. Evaluate for primary malignancy. Patient does have a history of malignant melanoma.  EXAM: CT CHEST, ABDOMEN, AND PELVIS WITH CONTRAST  TECHNIQUE: Multidetector CT imaging of the chest, abdomen and pelvis was performed following the standard protocol during bolus administration of intravenous contrast.  CONTRAST:  118mL OMNIPAQUE IOHEXOL 300 MG/ML  SOLN  COMPARISON:  Chest CT 06/12/2013. CT of the abdomen and pelvis 04/02/2012.  FINDINGS: CT CHEST FINDINGS  Mediastinum/Lymph  Nodes: Heart size is mildly enlarged. There is no significant pericardial fluid, thickening or pericardial calcification. There is atherosclerosis of the thoracic aorta, the great vessels of the mediastinum and the coronary arteries, including calcified atherosclerotic  plaque in the left main, left anterior descending, left circumflex and right coronary arteries. Dilatation of the distal ascending thoracic aorta which measures up to 4.9 cm in diameter. Large hiatal hernia with nearly completely intrathoracic stomach. No pathologically enlarged mediastinal, hilar or axillary lymph nodes. Severe calcifications of the aortic valve. Mild calcifications of the inferior mitral annulus.  Lungs/Pleura: Areas of scarring in the inferior segment of the lingula and in the basal segments of the left lower lobe, unchanged compared to prior study 06/12/2013. No definite suspicious appearing pulmonary nodules or masses. No acute consolidative airspace disease. No pleural effusions. Mild linear scarring is also noted in the right lower lobe.  Musculoskeletal/Soft Tissues: Multiple vertebral body compression fractures again noted, most notable at T7 and T11 (at T11 there is 90% loss of central vertebral body height), which are unchanged. There are no aggressive appearing lytic or blastic lesions noted in the visualized portions of the skeleton. Multiple old healed right-sided rib fractures.  CT ABDOMEN AND PELVIS FINDINGS  Hepatobiliary: 1.4 cm low-attenuation lesion in segment 2 of the liver is unchanged, compatible with a small simple cyst. No other suspicious appearing hepatic lesions are noted. Partially calcified lesion adjacent to segment 7, most compatible with some retroperitoneal fat necrosis (unchanged). Status post cholecystectomy. Minimal intrahepatic biliary ductal dilatation, and mild dilatation of the common bowel duct (12 mm in the porta hepatis), unchanged compared to the prior study from 2013, presumably related to a postcholecystectomy physiology.  Pancreas: Unremarkable.  Spleen: Unremarkable.  Adrenals/Urinary Tract: Small low-attenuation lesions in the kidneys bilaterally, the majority of which are too small to definitively characterize, but are unchanged compared to the  prior examination, favored to represent tiny cysts. Largest lesion measures 2.2 cm in the interpolar region of the right kidney, compatible with a simple cyst. No hydroureteronephrosis. Small bladder wall diverticulae bilaterally, largest of which is on the left measuring 1.5 cm (similar to the prior study). Bilateral adrenal glands are normal in appearance.  Stomach/Bowel: As discussed above the stomach is essentially nearly completely intra thoracic. Stomach is otherwise unremarkable in appearance. No pathologic dilatation of small bowel or colon. Numerous colonic diverticulae are present, particularly in the descending colon and sigmoid colon, without surrounding inflammatory changes to suggest an acute diverticulitis at this time.  Vascular/Lymphatic: Atherosclerosis throughout the abdominal and pelvic vasculature, without evidence of aneurysm or dissection. No lymphadenopathy noted in the abdomen or pelvis.  Reproductive: Prostate gland is markedly enlarged measuring up to 6.7 x 5.1 cm. Seminal vesicles are unremarkable in appearance.  Other: No significant volume of ascites.  No pneumoperitoneum.  Musculoskeletal: Old compression fractures at L1 and L2, similar to prior examination, with greater than 90% loss of central vertebral body height at L1. Old healed fractures of the right superior and inferior pubic rami. There are no aggressive appearing lytic or blastic lesions noted in the visualized portions of the skeleton.  IMPRESSION: 1. No definite primary malignancy identified in the chest, abdomen or pelvis. 2. No acute findings noted in the chest, abdomen or pelvis. 3. Extensive atherosclerosis, including left main and 3 vessel coronary artery disease. In addition, there is aneurysmal dilatation of the ascending thoracic aorta which measures up to 4.9 cm in diameter (previously the 4.8 cm in diameter on 06/12/2013). Ascending thoracic  aortic aneurysm. Recommend semi-annual imaging followup by CTA or MRA  and referral to cardiothoracic surgery if not already obtained. This recommendation follows 2010 ACCF/AHA/AATS/ACR/ASA/SCA/SCAI/SIR/STS/SVM Guidelines for the Diagnosis and Management of Patients With Thoracic Aortic Disease. Circulation. 2010; 121: P233-A076. 4. Massive hiatal hernia with nearly completely intrathoracic stomach, unchanged compared to prior examinations. 5. Prostatomegaly. 6. Additional incidental findings, similar to prior examinations, as above.   Electronically Signed   By: Vinnie Langton M.D.   On: 08/17/2014 18:12   Mr Jodene Nam Head/brain Wo Cm  08/17/2014   CLINICAL DATA:  Confusion, speech difficulty, onset of symptoms this afternoon.  EXAM: MRI HEAD WITHOUT CONTRAST  MRA HEAD WITHOUT CONTRAST  TECHNIQUE: Multiplanar, multiecho pulse sequences of the brain and surrounding structures were obtained without intravenous contrast. Angiographic images of the head were obtained using MRA technique without contrast.  COMPARISON:  CT of the head August 16, 2014 at 1915 hr and MRI of the head April 16, 2012  FINDINGS: MRI HEAD FINDINGS  Very faint area reduced diffusion in LEFT temporal lobe, minimal low ADC values associated with a 9 x 18 mm avidly enhancing mass within the LEFT temporal gray-white matter junction superimposed area of T2 shine through. Associated T2 bright mildly expansile signal. A second cystic 14 x 29 mm RIGHT mesial periventricular occipital lobe mass. Punctate focus of enhancement along the LEFT occipital lobe white matter. No susceptibility artifact to suggest hemorrhage. No reduced diffusion to suggest acute ischemia.  Ventricles and sulci are otherwise normal for patient's age. No midline shift. Innumerable tiny T2 hyperintensities within the basal ganglia and thalamus are relatively unchanged. Patchy supratentorial white matter changes exclusive of the aforementioned abnormality suggest chronic small vessel ischemic disease.  No abnormal extra-axial fluid collections,  extra-axial masses or abnormal extra-axial enhancement.  Status post bilateral ocular lens implants. Mild paranasal sinus mucosal thickening without air-fluid levels. Minimal LEFT mastoid effusion. No suspicious calvarial bone marrow signal. No cerebellar tonsillar ectopia. No abnormal sellar expansion.  MRA HEAD FINDINGS  Anterior circulation: Normal flow related enhancement of the cervical, petrous, cavernous and supra clinoid internal carotid arteries. Normal flow related enhancement anterior and middle cerebral arteries.  Posterior circulation: Codominant vertebral arteries, normal flow related enhancement the vertebral arteries, vertebrobasilar junction, basilar artery main branch vessels. Normal flow related enhancement posterior cerebral arteries.  No large vessel occlusion, aneurysm, high-grade stenosis, suspicious luminal irregularity within the anterior nor posterior circulation.  IMPRESSION: MRI HEAD:  No acute ischemia.  9 x 18 mm LEFT temporal solid enhancing mass, LEFT mesial occipital lobe 14 x 20 mm cystic enhancing mass, punctate of additional enhancement within the LEFT occipital lobe, with surrounding nonenhancing mildly expansile FLAIR T2 hyperintense signal. 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 This could reflect metastasis, though metastasis typically have more mass effect.  Similar basal ganglia and thalamus perivascular spaces and/or remote lacunar infarcts. Mild white matter changes suggest chronic small vessel ischemic disease, similar.  MRA HEAD:  Normal MRA of the intracranial vessels.   Electronically Signed   By: Elon Alas   On: 08/17/2014 00:30     Today   Subjective:   Christopher [name] today has no headache,no chest abdominal pain,no new weakness tingling or numbness, feels much better today.  Objective:   Blood pressure 132/64, pulse 55, temperature 98 F (36.7  C), temperature source Oral, resp. rate 18, height 5\' 8"  (1.727 m), weight 78.835 kg (173 lb 12.8 oz), SpO2 98 %.  Intake/Output Summary (Last 24 hours) at 08/21/14 1003 Last data filed at 08/20/14 1300  Gross per 24 hour  Intake    240 ml  Output      0 ml  Net    240 ml    Exam Awake Alert, Oriented 2-3, new F.N deficits, Normal affect [place].AT,PERRAL Supple Neck,No JVD, No cervical lymphadenopathy appriciated.  Symmetrical Chest wall movement, Good air movement bilaterally, CTAB RRR,No Gallops,Rubs or new Murmurs, No Parasternal Heave +ve B.Sounds, Abd Soft, Non tender, No organomegaly appriciated, No rebound -guarding or rigidity. No Cyanosis, Clubbing or edema, No new Rash or bruise  Data Review   Cultures - *  CBC w Diff:  Lab Results  Component Value Date   WBC 13.2* 08/19/2014   HGB 12.5* 08/19/2014   HCT 37.6* 08/19/2014   PLT 181 08/19/2014   LYMPHOPCT 9* 08/16/2014   MONOPCT 4 08/16/2014   EOSPCT 0 08/16/2014   BASOPCT 0 08/16/2014   CMP:  Lab Results  Component Value Date   NA 138 08/19/2014   K 3.5 08/19/2014   CL 109 08/19/2014   CO2 21 08/19/2014   BUN 26* 08/19/2014   CREATININE 1.06 08/19/2014   PROT 7.4 08/16/2014   ALBUMIN 3.7 08/16/2014   BILITOT 0.8 08/16/2014   ALKPHOS 55 08/16/2014   AST 21 08/16/2014   ALT 13 08/16/2014  .  Micro Results No results found for this or any previous visit (from the past 240 hour(s)).   Discharge Instructions          Follow-up Information    Follow up with Ria Bush, MD. Schedule an appointment as soon as possible for a visit in 1 week.   Specialty:  Family Medicine   Contact information:   [place] [address] [phone]       Follow up with Ophelia Charter, MD.   Specialty:  Neurosurgery   Why:  Monika Salk be called by his office regarding biopsy on 08/22/14   Contact information:   [address] [place] [place] [address] [phone]        Follow up with [facility], MD. Schedule an appointment as soon as possible for a visit in 1 week.   Specialty:  Oncology   Contact information:   [place] [place] [address] [phone]       Discharge Medications  - Patient is to have brain biopsy on 08/22/14, by neurosurgery, Plavix has been held on discharge, please follow the plan of care, and decision if is appropriate to resume or to continue to hold Plavix. - Patient will need to follow with oncology service after biopsy is done. - Patient is on dysphagia 3 diet    Medication List    STOP taking these medications        clopidogrel 75 MG tablet  Commonly known as:  PLAVIX     furosemide 20 MG tablet  Commonly known as:  LASIX      TAKE these medications        acetaminophen 500 MG tablet  Commonly known as:  TYLENOL  Take 500 mg by mouth daily as needed (pain).     atorvastatin 10 MG tablet  Commonly known as:  LIPITOR  TAKE 1 TABLET (10 MG TOTAL) BY MOUTH DAILY.     cholecalciferol 1000 UNITS tablet  Commonly known as:  VITAMIN D  Take 1,000 Units by mouth at bedtime.     dexamethasone 2 MG tablet  Commonly known as:  DECADRON  Take 1 tablet (2 mg total) by mouth 2 (two) times daily.     Fish Oil 1000 MG Caps  Take 1,000 mg by mouth at bedtime.     levETIRAcetam 500 MG tablet  Commonly known as:  KEPPRA  Take 1 tablet (500 mg total) by mouth 2 (two) times daily.     lisinopril 10 MG tablet  Commonly known as:  PRINIVIL,ZESTRIL  TAKE 1 TABLET BY MOUTH EVERY DAY     Melatonin 10 MG Caps  Take 10 mg by mouth at bedtime.     multivitamin with minerals Tabs tablet  Take 1 tablet by mouth daily.     pantoprazole 40 MG tablet  Commonly known as:  PROTONIX  TAKE 1 TABLET EVERY DAY     potassium chloride SA 20 MEQ tablet  Commonly known as:  K-DUR,KLOR-CON  Take 20 mEq by mouth at bedtime.     sertraline 25 MG tablet  Commonly known as:  ZOLOFT  Take 25 mg by mouth at bedtime.      SYSTANE OP  Place 1 drop into both eyes daily.         Total Time in preparing paper work, data evaluation and todays exam - 35 minutes  [name] M.D on 08/21/2014 at 10:03 AM  [facility] Office  [phone]

## 2014-08-20 NOTE — Progress Notes (Signed)
DC instructions and handouts given to patient and his daughter. All questions answered. Patient to be transported to [facility] by ambulance.

## 2014-08-20 NOTE — Progress Notes (Signed)
Report called to Solmon Ice at [facility]. Patient awaiting transport.

## 2014-08-21 DIAGNOSIS — G9389 Other specified disorders of brain: Secondary | ICD-10-CM

## 2014-08-21 DIAGNOSIS — I639 Cerebral infarction, unspecified: Secondary | ICD-10-CM

## 2014-08-22 ENCOUNTER — Encounter: Payer: Self-pay | Admitting: Internal Medicine

## 2014-08-22 ENCOUNTER — Non-Acute Institutional Stay (SKILLED_NURSING_FACILITY): Payer: Medicare Other | Admitting: Internal Medicine

## 2014-08-22 DIAGNOSIS — I6389 Other cerebral infarction: Secondary | ICD-10-CM

## 2014-08-22 DIAGNOSIS — I638 Other cerebral infarction: Secondary | ICD-10-CM

## 2014-08-22 DIAGNOSIS — I10 Essential (primary) hypertension: Secondary | ICD-10-CM

## 2014-08-22 DIAGNOSIS — E785 Hyperlipidemia, unspecified: Secondary | ICD-10-CM

## 2014-08-22 NOTE — Progress Notes (Signed)
MRN: [id number] Name: Christopher [name]  Sex: male Age: 81 y.o. DOB: [birth year]  [place] #: Helene Kelp Facility/Room:220 Level Of Care: SNF Provider: Inocencio Homes D Emergency Contacts: Extended Emergency Contact Information Primary Emergency Contact: Lewis,[name] Address: [address]          [place], [place] [address] [place]net[place] of [place] Phone: [phone] Relation: Daughter Secondary Emergency Contact: Choma,[name] Address: [address]           [place], [place] [address] [place] Mobile Phone: [phone] Relation: Son  Code Status: DNR  Allergies: Review of patient's allergies indicates no known allergies.  Chief Complaint  Patient presents with  . New Admit To SNF    HPI: Patient is 81 y.o. male who presented to hospital with stroke like sx, dx with possible brain tumor, scheduled for brain bx. Pt and daughter relay OK with bx but do not plan any oncologic intervention.  Past Medical History  Diagnosis Date  . GERD (gastroesophageal reflux disease) 08/1996    with large HH and partially intrathoracic stomach  . Hyperlipemia 08/1986  . Hypertension Pre 1996  . Osteoporosis 2006    dexa T -2.8 (05/2012), T11 compression fx  . Pneumonia 45 yoa  . History of Doppler echocardiogram 05/1995    MVP, MOD AI, MILD TR, MILD MR  . Elevated PSA     decided against further eval  . Diverticulosis   . Bilateral sensorineural hearing loss 02/2012    some conductive left side  . Compression fracture     multiple  . AV block, 2nd degree 04/2012    seen cards, monitor for now, pt does not desire monitor/pacer  . BCC (basal cell carcinoma of skin) 08/2012    s/p excision Allyson Sabal)  . HOH (hard of hearing)   . Depression   . Chronic kidney disease     elevated PSA-no tx  . CVA (cerebral infarction) 04/20/2012    04/16/12 IMPRESSION:  1. 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 No mass effect or hemorrhage. Basilar artery  perforator territory.  2. See MRA findings below.  3. Otherwise stable MRI appearance of the brain since 2011 including extensive perivascular spaces (etat crible appearance)  . Fall at home 06/12/2013    multiple rib fractures; traumatic hemopneumothorax/notes 06/12/2013 (06/14/2013)  . Hiatal hernia 04/2014    mod-large by CXR    Past Surgical History  Procedure Laterality Date  . Cholecystectomy  05/1995  . Exercise treadmill  09/12/2006    NML  . Esophagogastroduodenoscopy  08/18/2004    H. H. Dr. Fuller Plan  . Colonoscopy  05/01/2007    divericulosis, int hemorrhoids - Dr. Fuller Plan  . Dexa  05/2012    T score -2.8 hip, T11 compression fx  . Skin full thickness graft Left 10/19/2012    Procedure: REPAIR OF LEFT EAR WITH TISSUE REARRANGEMENT SKIN GRAFT FULL THICKNESS;  Surgeon: Theodoro Kos, DO;  Location: [facility];  Service: Plastics;  Laterality: Left;  . US echocardiography  05/2013    Mod LVH, EF 60-65%, indeterminate diastolic fxn, aortic sclerosis without stenosis, mildly dilated RV with nl sys fxn, mod dilated arch  . Inguinal hernia repair Left 08/14/2010    Dr. Hassell Done  . Cataract extraction w/ intraocular lens  implant, bilateral Bilateral       Medication List       This list is accurate as of: 08/22/14 11:59 PM.  Always use your most recent med list.  acetaminophen 500 MG tablet  Commonly known as:  TYLENOL  Take 500 mg by mouth daily as needed (pain).     atorvastatin 10 MG tablet  Commonly known as:  LIPITOR  TAKE 1 TABLET (10 MG TOTAL) BY MOUTH DAILY.     cholecalciferol 1000 UNITS tablet  Commonly known as:  VITAMIN D  Take 1,000 Units by mouth at bedtime.     dexamethasone 2 MG tablet  Commonly known as:  DECADRON  Take 1 tablet (2 mg total) by mouth 2 (two) times daily.     Fish Oil 1000 MG Caps  Take 1,000 mg by mouth at bedtime.     levETIRAcetam 500 MG tablet  Commonly known as:  KEPPRA  Take 1 tablet (500 mg total) by  mouth 2 (two) times daily.     lisinopril 10 MG tablet  Commonly known as:  PRINIVIL,ZESTRIL  TAKE 1 TABLET BY MOUTH EVERY DAY     Melatonin 10 MG Caps  Take 10 mg by mouth at bedtime.     multivitamin with minerals Tabs tablet  Take 1 tablet by mouth daily.     pantoprazole 40 MG tablet  Commonly known as:  PROTONIX  TAKE 1 TABLET EVERY DAY     potassium chloride SA 20 MEQ tablet  Commonly known as:  K-DUR,KLOR-CON  Take 20 mEq by mouth at bedtime.     sertraline 25 MG tablet  Commonly known as:  ZOLOFT  Take 25 mg by mouth at bedtime.     SYSTANE OP  Place 1 drop into both eyes daily.        No orders of the defined types were placed in this encounter.    Immunization History  Administered Date(s) Administered  . Influenza Split 06/08/2011, 04/20/2012  . Influenza Whole 05/28/2006, 05/19/2007, 05/13/2008, 05/08/2009, 05/14/2010  . Influenza,inj,Quad PF,36+ Mos 05/10/2013, 04/16/2014  . Pneumococcal Conjugate-13 04/16/2014  . Pneumococcal Polysaccharide-23 08/09/1996  . Td 01/07/2002, 02/15/2012  . Zoster 08/19/2008    History  Substance Use Topics  . Smoking status: Never Smoker   . Smokeless tobacco: Never Used  . Alcohol Use: No    Family history is noncontributory    Review of Systems  DATA OBTAINED: from patient, family member GENERAL:  no fevers, fatigue, appetite changes SKIN: No itching, rash or wounds EYES: No eye pain, redness, discharge EARS: No earache, tinnitus; HOH NOSE: No congestion, drainage or bleeding  MOUTH/THROAT: No mouth or tooth pain, No sore throat RESPIRATORY: No cough, wheezing, SOB CARDIAC: No chest pain, palpitations, lower extremity edema  GI: No abdominal pain, No N/V/D or constipation, No heartburn or reflux  GU: No dysuria, frequency or urgency, or incontinence  MUSCULOSKELETAL: No unrelieved bone/joint pain NEUROLOGIC: No headache, dizziness or focal weakness PSYCHIATRIC: No overt anxiety or sadness, No behavior  issue.   Filed Vitals:   08/22/14 2201  BP: 127/71  Pulse: 44  Temp: 97 F (36.1 C)  Resp: 20    Physical Exam  GENERAL APPEARANCE: Alert, conversant,  No acute distress.  SKIN: No diaphoresis rash HEAD: Normocephalic, atraumatic  EYES: Conjunctiva/lids clear. Pupils round, reactive. EOMs intact.  EARS: External exam WNL, canals clear. Hearing grossly normal.  NOSE: No deformity or discharge.  MOUTH/THROAT: Lips w/o lesions  RESPIRATORY: Breathing is even, unlabored. Lung sounds are clear   CARDIOVASCULAR: Heart RRR no murmurs, rubs or gallops. No peripheral edema.   GASTROINTESTINAL: Abdomen is soft, non-tender, not distended w/ normal bowel sounds. GENITOURINARY: Bladder non tender,  not distended  MUSCULOSKELETAL: No abnormal joints or musculature NEUROLOGIC:  Cranial nerves 2-12 grossly intact. Moves all extremities  PSYCHIATRIC: very pleasant, no behavioral issues  Patient Active Problem List   Diagnosis Date Noted  . CVA (cerebral infarction) 08/21/2014  . Mass of brain 08/21/2014  . Prediabetes 04/16/2014  . Dysphagia, unspecified(787.20) 09/05/2013  . Onychomycosis 07/13/2013  . Late effects of CVA (cerebrovascular accident) 06/21/2013  . Gait instability 06/21/2013  . Fall 06/12/2013  . Traumatic hemopneumothorax 06/12/2013  . Medicare annual wellness visit, subsequent 02/16/2013  . Insomnia 02/16/2013  . Constipation 02/16/2013  . DOE (dyspnea on exertion) 01/17/2013  . 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 Palpitations 06/13/2012  . MDD (major depressive disorder), single episode 06/13/2012  . Second degree AV block 04/17/2012  . Compression fracture 04/02/2012  . HIATAL HERNIA 10/27/2007  . DIVERTICULOSIS, COLON 05/01/2007  . HLD (hyperlipidemia) 01/06/2007  . Essential hypertension 01/06/2007  . GERD 01/06/2007  . OSTEOPOROSIS 01/06/2007  . ROSACEA 01/05/2007  . PROSTATE SPECIFIC ANTIGEN, ELEVATED 01/05/2007    CBC    Component Value  Date/Time   WBC 13.2* 08/19/2014 0608   RBC 4.23 08/19/2014 0608   HGB 12.5* 08/19/2014 0608   HCT 37.6* 08/19/2014 0608   PLT 181 08/19/2014 0608   MCV 88.9 08/19/2014 0608   LYMPHSABS 0.9 08/16/2014 1911   MONOABS 0.4 08/16/2014 1911   EOSABS 0.0 08/16/2014 1911   BASOSABS 0.0 08/16/2014 1911    CMP     Component Value Date/Time   NA 138 08/19/2014 0608   K 3.5 08/19/2014 0608   CL 109 08/19/2014 0608   CO2 21 08/19/2014 0608   GLUCOSE 138* 08/19/2014 0608   BUN 26* 08/19/2014 0608   CREATININE 1.06 08/19/2014 0608   CALCIUM 8.8 08/19/2014 0608   PROT 7.4 08/16/2014 1911   ALBUMIN 3.7 08/16/2014 1911   AST 21 08/16/2014 1911   ALT 13 08/16/2014 1911   ALKPHOS 55 08/16/2014 1911   BILITOT 0.8 08/16/2014 1911   GFRNONAA 61* 08/19/2014 0608   GFRAA 70* 08/19/2014 0608    Assessment and Plan  CVA (cerebral infarction) This is most likely related to brain tumor, suspicion for multifocal glioblastoma by radiology reading,  - CT chest abdomen pelvis does not show any evidence of metastases. - on Keppra for seizure prophylaxis - on Decadron secondary to brain tumor - Stopped Plavix for brain biopsy. - Neurosurgery consult appreciated, plan is for biopsy as an outpatient, told by neurosurgery this coming Thursday. - Oncology consult appreciated. Patient will need to follow with oncology after biopsy .    Essential hypertension Cont lisinopril   HLD (hyperlipidemia) Cont lipitor 10 mg; LDL 81;HDL 37     Hennie Duos, MD

## 2014-08-23 ENCOUNTER — Telehealth: Payer: Self-pay | Admitting: Family Medicine

## 2014-08-23 NOTE — Telephone Encounter (Signed)
Spoke with pt's daughter Vaughan Basta.  Pt was d/c'd to [facility].  She will schedule f/u with Dr. Darnell Level once pt is d/c'd home.

## 2014-08-24 ENCOUNTER — Encounter: Payer: Self-pay | Admitting: Internal Medicine

## 2014-08-24 NOTE — Assessment & Plan Note (Signed)
Cont lisinopril 

## 2014-08-24 NOTE — Assessment & Plan Note (Signed)
This is most likely related to brain tumor, suspicion for multifocal glioblastoma by radiology reading,  - CT chest abdomen pelvis does not show any evidence of metastases. - on Keppra for seizure prophylaxis - on Decadron secondary to brain tumor - Stopped Plavix for brain biopsy. - Neurosurgery consult appreciated, plan is for biopsy as an outpatient, told by neurosurgery this coming Thursday. - Oncology consult appreciated. Patient will need to follow with oncology after biopsy .

## 2014-08-24 NOTE — Assessment & Plan Note (Signed)
Cont lipitor 10 mg; LDL 81;HDL 37

## 2014-08-29 ENCOUNTER — Inpatient Hospital Stay (HOSPITAL_COMMUNITY): Admission: RE | Admit: 2014-08-29 | Payer: Medicare Other | Source: Ambulatory Visit | Admitting: Neurosurgery

## 2014-08-29 ENCOUNTER — Encounter (HOSPITAL_COMMUNITY): Admission: RE | Payer: Self-pay | Source: Ambulatory Visit

## 2014-08-29 SURGERY — CRANIOTOMY TUMOR EXCISION
Anesthesia: General | Laterality: Left

## 2014-09-10 ENCOUNTER — Non-Acute Institutional Stay (SKILLED_NURSING_FACILITY): Payer: Medicare Other | Admitting: Nurse Practitioner

## 2014-09-10 DIAGNOSIS — R972 Elevated prostate specific antigen [PSA]: Secondary | ICD-10-CM

## 2014-09-10 DIAGNOSIS — K219 Gastro-esophageal reflux disease without esophagitis: Secondary | ICD-10-CM

## 2014-09-10 DIAGNOSIS — G9389 Other specified disorders of brain: Secondary | ICD-10-CM

## 2014-09-10 DIAGNOSIS — G939 Disorder of brain, unspecified: Secondary | ICD-10-CM

## 2014-09-10 DIAGNOSIS — I10 Essential (primary) hypertension: Secondary | ICD-10-CM

## 2014-09-10 DIAGNOSIS — R2681 Unsteadiness on feet: Secondary | ICD-10-CM

## 2014-09-10 DIAGNOSIS — R059 Cough, unspecified: Secondary | ICD-10-CM

## 2014-09-10 DIAGNOSIS — R05 Cough: Secondary | ICD-10-CM

## 2014-09-10 DIAGNOSIS — E785 Hyperlipidemia, unspecified: Secondary | ICD-10-CM

## 2014-09-10 NOTE — Progress Notes (Addendum)
Patient ID: Christopher [name], male   DOB: 13-Aug-1923, 81 y.o.   MRN: [id number]    Nursing Home Location:  [facility]   Place of Service: SNF (31)  PCP: Ria Bush, MD  No Known Allergies  Chief Complaint  Patient presents with  . Discharge Note    HPI:  Patient is a 81 y.o. male seen today at [facility] for discharge home. Pt was hospitalized after found down at home. Was found to have large brain mass. Pt reports he does not want this evaluated further at this time. Patient currently doing well with therapy, now stable to discharge home with home health.   Review of Systems:  Review of Systems  Constitutional: Negative for activity change, appetite change, fatigue and unexpected weight change.  HENT: Positive for congestion and hearing loss. Negative for sinus pressure, sore throat, trouble swallowing and voice change.        Reports on and off congestion as well as cough. None at this time  Eyes: Negative.   Respiratory: Positive for cough (on and off). Negative for shortness of breath.   Cardiovascular: Negative for chest pain, palpitations and leg swelling.  Gastrointestinal: Negative for abdominal pain, diarrhea and constipation.  Genitourinary: Negative for dysuria and difficulty urinating.  Musculoskeletal: Negative for myalgias and arthralgias.  Skin: Negative for color change and wound.  Neurological: Negative for dizziness, seizures, facial asymmetry, weakness, light-headedness, numbness and headaches.  Psychiatric/Behavioral: Negative for behavioral problems, confusion and agitation.    Past Medical History  Diagnosis Date  . GERD (gastroesophageal reflux disease) 08/1996    with large HH and partially intrathoracic stomach  . Hyperlipemia 08/1986  . Hypertension Pre 1996  . Osteoporosis 2006    dexa T -2.8 (05/2012), T11 compression fx  . Pneumonia 46 yoa  . History of Doppler echocardiogram 05/1995    MVP, MOD AI, MILD TR,  MILD MR  . Elevated PSA     decided against further eval  . Diverticulosis   . Bilateral sensorineural hearing loss 02/2012    some conductive left side  . Compression fracture     multiple  . AV block, 2nd degree 04/2012    seen cards, monitor for now, pt does not desire monitor/pacer  . BCC (basal cell carcinoma of skin) 08/2012    s/p excision Allyson Sabal)  . HOH (hard of hearing)   . Depression   . Chronic kidney disease     elevated PSA-no tx  . CVA (cerebral infarction) 04/20/2012    04/16/12 IMPRESSION:  1. 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 No mass effect or hemorrhage. Basilar artery perforator territory.  2. See MRA findings below.  3. Otherwise stable MRI appearance of the brain since 2011 including extensive perivascular spaces (etat crible appearance)  . Fall at home 06/12/2013    multiple rib fractures; traumatic hemopneumothorax/notes 06/12/2013 (06/14/2013)  . Hiatal hernia 04/2014    mod-large by CXR   Past Surgical History  Procedure Laterality Date  . Cholecystectomy  05/1995  . Exercise treadmill  09/12/2006    NML  . Esophagogastroduodenoscopy  08/18/2004    H. H. Dr. Fuller Plan  . Colonoscopy  05/01/2007    divericulosis, int hemorrhoids - Dr. Fuller Plan  . Dexa  05/2012    T score -2.8 hip, T11 compression fx  . Skin full thickness graft Left 10/19/2012    Procedure: REPAIR OF LEFT EAR WITH TISSUE REARRANGEMENT SKIN GRAFT FULL THICKNESS;  Surgeon:  Claire Sanger, DO;  Location: [facility];  Service: Plastics;  Laterality: Left;  . US echocardiography  05/2013    Mod LVH, EF 60-65%, indeterminate diastolic fxn, aortic sclerosis without stenosis, mildly dilated RV with nl sys fxn, mod dilated arch  . Inguinal hernia repair Left 08/14/2010    Dr. Hassell Done  . Cataract extraction w/ intraocular lens  implant, bilateral Bilateral    Social History:   reports that he has never smoked. He has never used smokeless tobacco. He reports that he  does not drink alcohol or use illicit drugs.  Family History  Problem Relation Age of Onset  . Heart disease Mother     MI  . Hypertension Mother   . Stroke Mother     ?  . Stroke Father   . Diabetes Sister   . Hypertension Sister   . Hypertension Sister   . Hypertension Sister   . Stroke Sister     recurrent mini strokes  . Cancer Neg Hx     Medications: Patient's Medications  New Prescriptions   No medications on file  Previous Medications   ACETAMINOPHEN (TYLENOL) 500 MG TABLET    Take 500 mg by mouth daily as needed (pain).   ATORVASTATIN (LIPITOR) 10 MG TABLET    TAKE 1 TABLET (10 MG TOTAL) BY MOUTH DAILY.   CHOLECALCIFEROL (VITAMIN D) 1000 UNITS TABLET    Take 1,000 Units by mouth at bedtime.   DEXAMETHASONE (DECADRON) 2 MG TABLET    Take 1 tablet (2 mg total) by mouth 2 (two) times daily.   LEVETIRACETAM (KEPPRA) 500 MG TABLET    Take 1 tablet (500 mg total) by mouth 2 (two) times daily.   LISINOPRIL (PRINIVIL,ZESTRIL) 10 MG TABLET    TAKE 1 TABLET BY MOUTH EVERY DAY   MELATONIN 10 MG CAPS    Take 10 mg by mouth at bedtime.    MULTIPLE VITAMIN (MULTIVITAMIN WITH MINERALS) TABS TABLET    Take 1 tablet by mouth daily.   OMEGA-3 FATTY ACIDS (FISH OIL) 1000 MG CAPS    Take 1,000 mg by mouth at bedtime.    PANTOPRAZOLE (PROTONIX) 40 MG TABLET    TAKE 1 TABLET EVERY DAY   POLYETHYL GLYCOL-PROPYL GLYCOL (SYSTANE OP)    Place 1 drop into both eyes daily.   POTASSIUM CHLORIDE SA (K-DUR,KLOR-CON) 20 MEQ TABLET    Take 20 mEq by mouth at bedtime.   SERTRALINE (ZOLOFT) 25 MG TABLET    Take 25 mg by mouth at bedtime.  Modified Medications   No medications on file  Discontinued Medications   No medications on file     Physical Exam: Filed Vitals:   09/10/14 1706  BP: 129/86  Pulse: 80  Temp: 97.1 F (36.2 C)  Resp: 18    Physical Exam  Constitutional: He is oriented to person, place, and time. He appears well-developed and well-nourished. No distress.  HENT:  Head:  Normocephalic and atraumatic.  Mouth/Throat: Oropharynx is clear and moist. No oropharyngeal exudate.  Eyes: Conjunctivae and EOM are normal. Pupils are equal, round, and reactive to light.  Neck: Normal range of motion. Neck supple.  Cardiovascular: Normal rate, regular rhythm and normal heart sounds.   Pulmonary/Chest: Effort normal and breath sounds normal.  Abdominal: Soft. Bowel sounds are normal.  Musculoskeletal: He exhibits no edema or tenderness.  Neurological: He is alert and oriented to person, place, and time. No cranial nerve deficit.  Skin: Skin is warm and dry. He  is not diaphoretic.  Psychiatric: He has a normal mood and affect.    Labs reviewed: Basic Metabolic Panel:  Recent Labs  08/16/14 1911 08/16/14 2102 08/18/14 0652 08/19/14 0608  NA 139 141 139 138  K 3.7 3.7 4.0 3.5  CL 104 104 106 109  CO2 27  --  29 21  GLUCOSE 150* 147* 144* 138*  BUN 14 17 20  26*  CREATININE 1.20 1.20 1.14 1.06  CALCIUM 9.5  --  9.3 8.8   Liver Function Tests:  Recent Labs  04/16/14 1243 08/16/14 1911  AST 17 21  ALT 13 13  ALKPHOS 50 55  BILITOT 0.9 0.8  PROT 8.1 7.4  ALBUMIN 4.0 3.7   No results for input(s): LIPASE, AMYLASE in the last 8760 hours. No results for input(s): AMMONIA in the last 8760 hours. CBC:  Recent Labs  09/12/13 1218 08/16/14 1911 08/16/14 2102 08/18/14 0652 08/19/14 0608  WBC 11.9* 9.8  --  8.8 13.2*  NEUTROABS 8.9* 8.5*  --   --   --   HGB 14.1 14.1 15.3 12.8* 12.5*  HCT 43.0 41.9 45.0 38.2* 37.6*  MCV 95.2 89.7  --  88.8 88.9  PLT 297.0 191  --  181 181   TSH:  Recent Labs  09/12/13 1218  TSH 0.69   A1C: Lab Results  Component Value Date   HGBA1C 5.8* 08/17/2014   Lipid Panel:  Recent Labs  04/16/14 1243 08/17/14 0530  CHOL 148 135  HDL 39.80 37*  LDLCALC 75 81  TRIG 164.0* 86  CHOLHDL 4 3.6    Assessment/Plan 1. Essential hypertension Stable on lisinipril   2. Gastroesophageal reflux disease without  esophagitis Cont on protonix   3. Mass of brain Pt opts for no further evaluation or treatment due to age.  conts dexamethasone and keppra.  -currently plavix has been on hold since hospitalization, PCP to reevaluate   4. HLD (hyperlipidemia) conts statin   5. Cough Reports this has been off and on for sometime. CXR done without acute findings.  PRN robitussin helping. To follow up with PCP sooner if cough gets worse  6. Gait instability Gait and strength has improved, pt is stable for discharge-will need PT/OT per home health. DME needed includes walker and wheel chair.Marland Kitchen Rx written.  will need to follow up with PCP within 2 weeks. Pt uses wheelchair for stability for ADL's then a walker for everything else.

## 2014-09-11 ENCOUNTER — Encounter: Payer: Self-pay | Admitting: Nurse Practitioner

## 2014-09-11 ENCOUNTER — Encounter: Payer: Self-pay | Admitting: Internal Medicine

## 2014-09-11 NOTE — Assessment & Plan Note (Signed)
Pt will need a walker for safety with ADL's, pt can propel self and pr needs a WC for movement other than ADL's.

## 2014-09-11 NOTE — Progress Notes (Signed)
This encounter was created in error - please disregard.

## 2014-09-13 ENCOUNTER — Telehealth: Payer: Self-pay

## 2014-09-13 NOTE — Telephone Encounter (Signed)
Christopher [name] pts daughter left v/m; pt came home from [place] on 09/12/14; does not appear pt has been taking Plavix while at [facility]; pt was going to have surgery while at [facility] but pt did not have surgery:maybe why plavix was stopped. Christopher [name] wants to know if pt should be taking Plavix. [name] request cb. Pt has f/u [facility] appt on 09/17/14 with Dr Darnell Level. Please advise. DPR signed to speak with Christopher [name] or [name].

## 2014-09-13 NOTE — Telephone Encounter (Signed)
I would restart and discuss whether we want to continue next week.

## 2014-09-13 NOTE — Telephone Encounter (Signed)
Patient's son notified and will have him restart.

## 2014-09-17 ENCOUNTER — Ambulatory Visit: Payer: Medicare Other | Admitting: Family Medicine

## 2014-09-17 DIAGNOSIS — R262 Difficulty in walking, not elsewhere classified: Secondary | ICD-10-CM | POA: Diagnosis not present

## 2014-09-17 DIAGNOSIS — R1312 Dysphagia, oropharyngeal phase: Secondary | ICD-10-CM | POA: Diagnosis not present

## 2014-09-17 DIAGNOSIS — D432 Neoplasm of uncertain behavior of brain, unspecified: Secondary | ICD-10-CM | POA: Diagnosis not present

## 2014-09-17 DIAGNOSIS — I10 Essential (primary) hypertension: Secondary | ICD-10-CM | POA: Diagnosis not present

## 2014-09-23 ENCOUNTER — Telehealth: Payer: Self-pay | Admitting: Family Medicine

## 2014-09-23 ENCOUNTER — Ambulatory Visit: Payer: Medicare Other | Admitting: Family Medicine

## 2014-09-23 NOTE — Telephone Encounter (Signed)
Can we call pt's daughter or son - see if we can get him in this week for hosp f/u? Any 30 min slot (ok to combine 2 15 min).

## 2014-09-23 NOTE — Telephone Encounter (Signed)
Taken care of

## 2014-09-25 ENCOUNTER — Ambulatory Visit (INDEPENDENT_AMBULATORY_CARE_PROVIDER_SITE_OTHER): Payer: Medicare Other | Admitting: Family Medicine

## 2014-09-25 ENCOUNTER — Encounter: Payer: Self-pay | Admitting: Family Medicine

## 2014-09-25 VITALS — BP 132/86 | HR 72 | Temp 97.7°F | Wt 172.0 lb

## 2014-09-25 DIAGNOSIS — I6389 Other cerebral infarction: Secondary | ICD-10-CM

## 2014-09-25 DIAGNOSIS — G9389 Other specified disorders of brain: Secondary | ICD-10-CM

## 2014-09-25 DIAGNOSIS — E785 Hyperlipidemia, unspecified: Secondary | ICD-10-CM

## 2014-09-25 DIAGNOSIS — R059 Cough, unspecified: Secondary | ICD-10-CM | POA: Insufficient documentation

## 2014-09-25 DIAGNOSIS — R05 Cough: Secondary | ICD-10-CM

## 2014-09-25 DIAGNOSIS — G939 Disorder of brain, unspecified: Secondary | ICD-10-CM

## 2014-09-25 DIAGNOSIS — I10 Essential (primary) hypertension: Secondary | ICD-10-CM

## 2014-09-25 DIAGNOSIS — K5900 Constipation, unspecified: Secondary | ICD-10-CM

## 2014-09-25 DIAGNOSIS — I639 Cerebral infarction, unspecified: Secondary | ICD-10-CM

## 2014-09-25 DIAGNOSIS — I638 Other cerebral infarction: Secondary | ICD-10-CM

## 2014-09-25 DIAGNOSIS — K219 Gastro-esophageal reflux disease without esophagitis: Secondary | ICD-10-CM

## 2014-09-25 DIAGNOSIS — I499 Cardiac arrhythmia, unspecified: Secondary | ICD-10-CM | POA: Insufficient documentation

## 2014-09-25 DIAGNOSIS — R2681 Unsteadiness on feet: Secondary | ICD-10-CM

## 2014-09-25 DIAGNOSIS — F321 Major depressive disorder, single episode, moderate: Secondary | ICD-10-CM

## 2014-09-25 NOTE — Assessment & Plan Note (Signed)
This has largely resolved and pt no longer needs stool softener.

## 2014-09-25 NOTE — Assessment & Plan Note (Signed)
Chronic, stable. Continue lisinopril 10mg  daily. Goal 546-270 systolic.

## 2014-09-25 NOTE — Progress Notes (Signed)
BP 132/86 mmHg  Pulse 72  Temp(Src) 97.7 F (36.5 C) (Oral)  Wt 172 lb (78.019 kg)   CC: hosp and SNF f/u visit Subjective:    Patient ID: Christopher [name], male    DOB: 1923-09-28, 81 y.o.   MRN: [id number]  HPI: Christopher [name] is a 81 y.o. male presenting on 09/25/2014 for Follow-up   Presents with daughter and son.   Recent hospitalization last month with after AMS at home concerning for stroke but instead found to have brain mass concerning for multifocal glioblastoma. Plan was for brain biopsy 08/22/2014 so plavix was held, however pt decided against having brain biopsy (Dr Arnoldo Morale). Chest/abd/pelvis CT did not have evidence of metastatic disease. Keppra and decadron were started. He was on dysphagia III diet. He was discharged to [facility] and stayed there until 09/10/2014. Has decided against further evaluation after discussion with family and neurosurgery. Family is comfortable with this decision. 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  MRI 08/16/2014 showing no acute infarct but L temporal mass 9x11mm, L mesial occipital mass 14x62mm  Has HH OT/PT coming twice a week through February. Able to ambulate with rollator walker at home. Uses cane when with family. Urinating well with rare accidents. Normal BM twice daily.  Appetite ok, eating well. No restricted diet.  Living at home but family visits every day. Daughter calls him 3-4 times daily and nightly prior to bedtime. Carries cell phone.  Persistent cough present for last month. CXR at [facility] normal. CT chest with some basilar scarring. Off and on congestion in chest and some frontal pressure. No recent fevers/chills, ST, chest pain or dyspnea. Denies palpitations.  Admission date: 08/16/2014 Discharge Date 08/21/2014 Admission Diagnosis Stroke [I63.9] Discharge Diagnosis Principal Problem:  Mass of brain Active Problems:  HLD (hyperlipidemia)   Essential hypertension  CVA (cerebral infarction)  Relevant past medical, surgical, family and social history reviewed and updated as indicated. Interim medical history since our last visit reviewed. Allergies and medications reviewed and updated. Current Outpatient Prescriptions on File Prior to Visit  Medication Sig  . acetaminophen (TYLENOL) 500 MG tablet Take 500 mg by mouth daily as needed (pain).  Marland Kitchen atorvastatin (LIPITOR) 10 MG tablet TAKE 1 TABLET (10 MG TOTAL) BY MOUTH DAILY. (Patient taking differently: TAKE 1 TABLET (10 MG TOTAL) BY MOUTH DAILY AT BEDTIME)  . cholecalciferol (VITAMIN D) 1000 UNITS tablet Take 1,000 Units by mouth daily.   Marland Kitchen dexamethasone (DECADRON) 2 MG tablet Take 1 tablet (2 mg total) by mouth 2 (two) times daily.  Marland Kitchen levETIRAcetam (KEPPRA) 500 MG tablet Take 1 tablet (500 mg total) by mouth 2 (two) times daily.  Marland Kitchen lisinopril (PRINIVIL,ZESTRIL) 10 MG tablet TAKE 1 TABLET BY MOUTH EVERY DAY  . Melatonin 10 MG CAPS Take 10 mg by mouth at bedtime.   . Multiple Vitamin (MULTIVITAMIN WITH MINERALS) TABS tablet Take 1 tablet by mouth daily.  . Omega-3 Fatty Acids (FISH OIL) 1000 MG CAPS Take 1,000 mg by mouth at bedtime.   . pantoprazole (PROTONIX) 40 MG tablet TAKE 1 TABLET EVERY DAY  . Polyethyl Glycol-Propyl Glycol (SYSTANE OP) Place 1 drop into both eyes daily.  . potassium chloride SA (K-DUR,KLOR-CON) 20 MEQ tablet Take 20 mEq by mouth at bedtime.  . sertraline (ZOLOFT) 25 MG tablet Take 25 mg by mouth at bedtime.   No current facility-administered medications on file prior to visit.    Review of  Systems Per HPI unless specifically indicated above     Objective:    BP 132/86 mmHg  Pulse 72  Temp(Src) 97.7 F (36.5 C) (Oral)  Wt 172 lb (78.019 kg)  Wt Readings from Last 3 Encounters:  09/25/14 172 lb (78.019 kg)  08/16/14 173 lb 12.8 oz (78.835 kg)  04/16/14 169 lb (76.658 kg)    Physical Exam  Constitutional: He appears well-developed and  well-nourished. No distress.  HENT:  Head: Normocephalic and atraumatic.  Mouth/Throat: Oropharynx is clear and moist. No oropharyngeal exudate.  Eyes: Conjunctivae and EOM are normal. Pupils are equal, round, and reactive to light. No scleral icterus.  Neck: Normal range of motion. Neck supple. No thyromegaly present.  Cardiovascular: Normal rate, normal heart sounds and intact distal pulses.  An irregularly irregular rhythm present.  No murmur heard. Pulmonary/Chest: Effort normal and breath sounds normal. No respiratory distress. He has no wheezes. He has no rales.  Musculoskeletal: He exhibits no edema.  Lymphadenopathy:    He has no cervical adenopathy.  Neurological:  EOMI  Nursing note and vitals reviewed.     Assessment & Plan:   Problem List Items Addressed This Visit    MDD (major depressive disorder), single episode    Overall feels very satisfied on sertraline 25mg  daily. No changes indicated.      Mass of brain - Primary    Concern for multifocal glioblastoma multiforme on recent imaging studies in hospital. Pt has declined further evaluation/treatment which is reasonable choice for him given age. Will continue supportive care at home. He will finish out his currently planned therapy sessions and then will call me if desires referral to home hospice. Pt and family are in agreement.      Irregular heart beat    EKG checked today for irregular heart beat - not consistent with afib. Persistent AV block. Pt asxs. Continue to monitor. EKG - NSR rate 70s, frequent PACs, LAD, 1st degree AV block, poor R wave progression, no acute ST changes      Relevant Orders   EKG 12-Lead (Completed)   HLD (hyperlipidemia) (Chronic)    Continue lipitor 10mg  and fish oil daily.      GERD    Continue protonix 40mg  daily.      Gait instability    Regularly using walker and cane. Currently [facility] PT/OT working with him - has 1 more week of therapy planned.      Essential  hypertension (Chronic)    Chronic, stable. Continue lisinopril 10mg  daily. Goal 742-595 systolic.      CVA (cerebral infarction)    Continue plavix 2/2 h/o CVA in past. No acute infarct found on MRI during hospitalization.      Cough    Ongoing over last 1.5 months, recent CT scan in hospital unrevealing, and reported CXR in [facility] unrevealing as well. Stable lung exam today and no red flags - will treat with prn robitussin.      Constipation    This has largely resolved and pt no longer needs stool softener.          Follow up plan: Return in about 2 months (around 11/24/2014), or as needed, for follow up visit.

## 2014-09-25 NOTE — Assessment & Plan Note (Addendum)
Ongoing over last 1.5 months, recent CT scan in hospital unrevealing, and reported CXR in [facility] unrevealing as well. Stable lung exam today and no red flags - will treat with prn robitussin.

## 2014-09-25 NOTE — Progress Notes (Signed)
Pre visit review using our clinic review tool, if applicable. No additional management support is needed unless otherwise documented below in the visit note. 

## 2014-09-25 NOTE — Assessment & Plan Note (Signed)
Overall feels very satisfied on sertraline 25mg  daily. No changes indicated.

## 2014-09-25 NOTE — Assessment & Plan Note (Addendum)
Continue plavix 2/2 h/o CVA in past. No acute infarct found on MRI during hospitalization.

## 2014-09-25 NOTE — Assessment & Plan Note (Addendum)
Regularly using walker and cane. Currently [facility] PT/OT working with him - has 1 more week of therapy planned.

## 2014-09-25 NOTE — Assessment & Plan Note (Signed)
Continue protonix 40mg daily.  

## 2014-09-25 NOTE — Assessment & Plan Note (Signed)
Concern for multifocal glioblastoma multiforme on recent imaging studies in hospital. Pt has declined further evaluation/treatment which is reasonable choice for him given age. Will continue supportive care at home. He will finish out his currently planned therapy sessions and then will call me if desires referral to home hospice. Pt and family are in agreement.

## 2014-09-25 NOTE — Patient Instructions (Addendum)
Finish therapy at home. Call me after Home health is done and we will refer you to home hospice. Continue medicines as up to now. Don't suddenly stop decadron (dexamethasone). EKG today. Ok to take robitussin or delsym (guaifenesin + dextromethorphan) Return to see me in 2 months for follow up visit, sooner if needed

## 2014-09-25 NOTE — Assessment & Plan Note (Signed)
Continue lipitor 10mg  and fish oil daily.

## 2014-09-25 NOTE — Assessment & Plan Note (Signed)
EKG checked today for irregular heart beat - not consistent with afib. Persistent AV block. Pt asxs. Continue to monitor. EKG - NSR rate 70s, frequent PACs, LAD, 1st degree AV block, poor R wave progression, no acute ST changes

## 2014-09-27 ENCOUNTER — Telehealth: Payer: Self-pay | Admitting: Family Medicine

## 2014-09-27 NOTE — Telephone Encounter (Signed)
Duard Brady called you back about her father.  [name] asked for you to call her brother,Donny Michie,back.  Donny's number is [phone].  Vaughan Basta said they wanted to know if patient needs to follow up with his Cardiologist.

## 2014-09-27 NOTE — Telephone Encounter (Signed)
emmi emailed °

## 2014-09-27 NOTE — Telephone Encounter (Signed)
Patient's son notified that cardiology follow not needed per Dr. Synthia Innocent note.

## 2014-09-30 ENCOUNTER — Ambulatory Visit (INDEPENDENT_AMBULATORY_CARE_PROVIDER_SITE_OTHER): Payer: Medicare Other | Admitting: Podiatry

## 2014-09-30 ENCOUNTER — Encounter: Payer: Self-pay | Admitting: Podiatry

## 2014-09-30 DIAGNOSIS — B351 Tinea unguium: Secondary | ICD-10-CM

## 2014-09-30 DIAGNOSIS — M79676 Pain in unspecified toe(s): Secondary | ICD-10-CM

## 2014-09-30 NOTE — Patient Instructions (Signed)
Removed removed Band-Aid in the next 24-48 hours and apply topical antibiotic ointment to the end of the left great toe daily, cover with a Band-Aid until a scab forms

## 2014-09-30 NOTE — Progress Notes (Signed)
Patient ID: Christopher [name], male   DOB: [birth year], 81 y.o.   MRN: [id number]  Subjective: This patient presents complaining of painful toenails and requests debridement  Objective: The toenails are elongated, brittle, hypertrophic, discolored and tender to palpation 6-10  Assessment: Symptomatic onychomycoses 6-10  Plan: Debridement toenails 10 Pinpoint bleeding distal left hallux noted, topical antibiotic ointment and Band-Aid applied patient advised to remove Band-Aid next 24-48 hours and continue to apply topical antibiotic ointment to the end of the left great toe daily, cover with a Band-Aid until a scab forms  Reappoint 3 months

## 2014-10-02 ENCOUNTER — Ambulatory Visit: Payer: Medicare Other | Admitting: Family Medicine

## 2014-10-06 ENCOUNTER — Other Ambulatory Visit: Payer: Self-pay | Admitting: Nurse Practitioner

## 2014-10-07 ENCOUNTER — Telehealth: Payer: Self-pay | Admitting: Family Medicine

## 2014-10-07 DIAGNOSIS — G9389 Other specified disorders of brain: Secondary | ICD-10-CM

## 2014-10-07 NOTE — Telephone Encounter (Signed)
Pts daughter would like to give update regarding pt. Please call daughter.

## 2014-10-07 NOTE — Telephone Encounter (Signed)
Ok thanks for the update. Hospice referral placed

## 2014-10-07 NOTE — Addendum Note (Signed)
Addended by: Ria Bush on: 10/07/2014 02:12 PM   Modules accepted: Orders

## 2014-10-07 NOTE — Telephone Encounter (Signed)
Home health has been completed now, and they are ready for the hospice referral. Patient's daughter stated she and her brother have noticed patient getting weaker and even the patient admits feeling weaker, but he is still able to function independently.

## 2014-10-08 ENCOUNTER — Telehealth: Payer: Self-pay | Admitting: *Deleted

## 2014-10-08 MED ORDER — POTASSIUM CHLORIDE ER 10 MEQ PO TBCR: 10.0000 meq | EXTENDED_RELEASE_TABLET | Freq: Every day | ORAL | Status: DC | PRN

## 2014-10-08 MED ORDER — FUROSEMIDE 20 MG PO TABS: 10.0000 mg | ORAL_TABLET | Freq: Every day | ORAL | Status: DC | PRN

## 2014-10-08 NOTE — Telephone Encounter (Signed)
Wt Readings from Last 3 Encounters:  09/25/14 172 lb (78.019 kg)  08/16/14 173 lb 12.8 oz (78.835 kg)  04/16/14 169 lb (76.658 kg)  weight gain. Ok to try low dose lasix 1/2 tablet daily prn swelling. Take kdur 43mEq when he takes lasix.  Update [place] with effect.

## 2014-10-08 NOTE — Telephone Encounter (Signed)
Patients daughter notified and verbalized understanding 

## 2014-10-08 NOTE — Telephone Encounter (Signed)
Patient's daughter called and he weighed this AM and is 180 pounds now and has increased SOB. He is unable to talk without getting winded. There is no swelling noted.She asks about a fluid pill but also wonders if the weight gain may be from the decadron. He doesn't have any "problems" eating, but they haven't noticed him eating more per se. They haven't heard from hospice yet, but are hoping to hear something today or tomorrow.

## 2014-10-08 NOTE — Telephone Encounter (Signed)
[facility] referral called in.

## 2014-10-10 ENCOUNTER — Telehealth: Payer: Self-pay | Admitting: Family Medicine

## 2014-10-10 ENCOUNTER — Ambulatory Visit (INDEPENDENT_AMBULATORY_CARE_PROVIDER_SITE_OTHER): Payer: Medicare Other | Admitting: Family Medicine

## 2014-10-10 ENCOUNTER — Encounter: Payer: Self-pay | Admitting: Family Medicine

## 2014-10-10 VITALS — BP 108/80 | HR 60 | Temp 97.7°F | Wt 170.0 lb

## 2014-10-10 DIAGNOSIS — R0600 Dyspnea, unspecified: Secondary | ICD-10-CM | POA: Insufficient documentation

## 2014-10-10 DIAGNOSIS — I499 Cardiac arrhythmia, unspecified: Secondary | ICD-10-CM

## 2014-10-10 DIAGNOSIS — I639 Cerebral infarction, unspecified: Secondary | ICD-10-CM

## 2014-10-10 DIAGNOSIS — R531 Weakness: Secondary | ICD-10-CM

## 2014-10-10 DIAGNOSIS — G9389 Other specified disorders of brain: Secondary | ICD-10-CM

## 2014-10-10 DIAGNOSIS — I10 Essential (primary) hypertension: Secondary | ICD-10-CM

## 2014-10-10 DIAGNOSIS — G939 Disorder of brain, unspecified: Secondary | ICD-10-CM

## 2014-10-10 LAB — CBC WITH DIFFERENTIAL/PLATELET
Basophils Absolute: 0 10*3/uL (ref 0.0–0.1)
Basophils Relative: 0.2 % (ref 0.0–3.0)
Eosinophils Absolute: 0 10*3/uL (ref 0.0–0.7)
Eosinophils Relative: 0.1 % (ref 0.0–5.0)
HCT: 46.6 % (ref 39.0–52.0)
Hemoglobin: 16 g/dL (ref 13.0–17.0)
LYMPHS PCT: 11.2 % — AB (ref 12.0–46.0)
Lymphs Abs: 1.3 10*3/uL (ref 0.7–4.0)
MCHC: 34.4 g/dL (ref 30.0–36.0)
MCV: 89.4 fl (ref 78.0–100.0)
MONO ABS: 0.5 10*3/uL (ref 0.1–1.0)
Monocytes Relative: 4.4 % (ref 3.0–12.0)
NEUTROS PCT: 84.1 % — AB (ref 43.0–77.0)
Neutro Abs: 10.2 10*3/uL — ABNORMAL HIGH (ref 1.4–7.7)
Platelets: 178 10*3/uL (ref 150.0–400.0)
RBC: 5.21 Mil/uL (ref 4.22–5.81)
RDW: 16.6 % — AB (ref 11.5–15.5)
WBC: 12.1 10*3/uL — AB (ref 4.0–10.5)

## 2014-10-10 LAB — BASIC METABOLIC PANEL
BUN: 28 mg/dL — AB (ref 6–23)
CHLORIDE: 103 meq/L (ref 96–112)
CO2: 31 mEq/L (ref 19–32)
Calcium: 9.6 mg/dL (ref 8.4–10.5)
Creatinine, Ser: 1.4 mg/dL (ref 0.40–1.50)
GFR: 50.78 mL/min — AB (ref >60.00)
GLUCOSE: 131 mg/dL — AB (ref 70–99)
Potassium: 4.4 mEq/L (ref 3.5–5.1)
SODIUM: 139 meq/L (ref 135–145)

## 2014-10-10 LAB — HEPATIC FUNCTION PANEL
ALK PHOS: 71 U/L (ref 39–117)
ALT: 23 U/L (ref 0–53)
AST: 16 U/L (ref 0–37)
Albumin: 3.7 g/dL (ref 3.5–5.2)
BILIRUBIN DIRECT: 0.2 mg/dL (ref 0.0–0.3)
TOTAL PROTEIN: 7.4 g/dL (ref 6.0–8.3)
Total Bilirubin: 0.7 mg/dL (ref 0.2–1.2)

## 2014-10-10 LAB — BRAIN NATRIURETIC PEPTIDE: Pro B Natriuretic peptide (BNP): 228 pg/mL — ABNORMAL HIGH (ref 0.0–100.0)

## 2014-10-10 LAB — TSH: TSH: 0.98 u[IU]/mL (ref 0.35–4.50)

## 2014-10-10 MED ORDER — DEXAMETHASONE 2 MG PO TABS: 2.0000 mg | ORAL_TABLET | Freq: Two times a day (BID) | ORAL | Status: AC

## 2014-10-10 MED ORDER — LISINOPRIL 5 MG PO TABS: 5.0000 mg | ORAL_TABLET | Freq: Every day | ORAL | Status: DC

## 2014-10-10 MED ORDER — LEVETIRACETAM 500 MG PO TABS: 500.0000 mg | ORAL_TABLET | Freq: Two times a day (BID) | ORAL | Status: AC

## 2014-10-10 NOTE — Assessment & Plan Note (Addendum)
H/o AV block with irregular rhythm today - recheck EKG today. EKG - NSR rate 90s, LAD, rare PVC, prolonged PR (1st degree AV block), good R wave progression, Q inferior leads

## 2014-10-10 NOTE — Telephone Encounter (Signed)
emmi emailed °

## 2014-10-10 NOTE — Assessment & Plan Note (Addendum)
Worsening dyspnea - recheck EKG today. Check labs today (CMP, CBC, TSH and BNP).  Will call with results and plan. Abd exam benign today. rec restart lasix 10mg  prn

## 2014-10-10 NOTE — Assessment & Plan Note (Signed)
Has established with home hospice. Appreciate their care of patient.

## 2014-10-10 NOTE — Progress Notes (Signed)
Pre visit review using our clinic review tool, if applicable. No additional management support is needed unless otherwise documented below in the visit note. 

## 2014-10-10 NOTE — Progress Notes (Addendum)
BP 108/80 mmHg  Pulse 60  Temp(Src) 97.7 F (36.5 C)  Wt 170 lb (77.111 kg)  SpO2 96%   CC: weight gain, dypsnea  Subjective:    Patient ID: Christopher [name], male    DOB: 11-11-1923, 81 y.o.   MRN: [id number]  HPI: Christopher [name] is a 81 y.o. male presenting on 10/10/2014 for Shortness of Breath   Known brain mass and pt declined biopsy. Currently home hospice involved. Over last week noticing weight gain and increased dyspnea. Also endorsed mild leg swelling. Lasix 10mg  prn was started along with potassium 10 mEq prn. Dyspnea even at rest. No orthopnea, 1 pillow at bedtime, no PNdyspnea. Denies cough or chest pain/tightness.   Actually weight has not significantly increased and by our scales weight is own 2 lbs.   Hospice nurse came out yesterday to establish care.   BP Readings from Last 3 Encounters:  10/10/14 108/80  09/25/14 132/86  09/10/14 129/86   Relevant past medical, surgical, family and social history reviewed and updated as indicated. Interim medical history since our last visit reviewed. Allergies and medications reviewed and updated. Current Outpatient Prescriptions on File Prior to Visit  Medication Sig  . acetaminophen (TYLENOL) 500 MG tablet Take 500 mg by mouth daily as needed (pain).  Marland Kitchen atorvastatin (LIPITOR) 10 MG tablet TAKE 1 TABLET (10 MG TOTAL) BY MOUTH DAILY. (Patient taking differently: TAKE 1 TABLET (10 MG TOTAL) BY MOUTH DAILY AT BEDTIME)  . cholecalciferol (VITAMIN D) 1000 UNITS tablet Take 1,000 Units by mouth daily.   . furosemide (LASIX) 20 MG tablet Take 0.5 tablets (10 mg total) by mouth daily as needed.  . Melatonin 10 MG CAPS Take 10 mg by mouth at bedtime.   . Multiple Vitamin (MULTIVITAMIN WITH MINERALS) TABS tablet Take 1 tablet by mouth daily.  . Omega-3 Fatty Acids (FISH OIL) 1000 MG CAPS Take 1,000 mg by mouth at bedtime.   . pantoprazole (PROTONIX) 40 MG tablet TAKE 1 TABLET EVERY DAY  . Polyethyl Glycol-Propyl Glycol (SYSTANE OP) Place  1 drop into both eyes daily.  . potassium chloride (K-DUR) 10 MEQ tablet Take 1 tablet (10 mEq total) by mouth daily as needed (take with lasix).  . potassium chloride SA (K-DUR,KLOR-CON) 20 MEQ tablet Take 20 mEq by mouth at bedtime.  . sertraline (ZOLOFT) 25 MG tablet Take 25 mg by mouth at bedtime.  Marland Kitchen guaiFENesin-dextromethorphan (ROBITUSSIN DM) 100-10 MG/5ML syrup Take 5 mLs by mouth every 4 (four) hours as needed for cough.   No current facility-administered medications on file prior to visit.   Past Medical History  Diagnosis Date  . GERD (gastroesophageal reflux disease) 08/1996    with large HH and partially intrathoracic stomach  . Hyperlipemia 08/1986  . Hypertension Pre 1996  . Osteoporosis 2006    dexa T -2.8 (05/2012), T11 compression fx  . Pneumonia 60 yoa  . History of Doppler echocardiogram 05/1995    MVP, MOD AI, MILD TR, MILD MR  . Elevated PSA     decided against further eval  . Diverticulosis   . Bilateral sensorineural hearing loss 02/2012    some conductive left side  . Compression fracture     multiple  . AV block, 2nd degree 04/2012    seen cards, monitor for now, pt does not desire monitor/pacer  . BCC (basal cell carcinoma of skin) 08/2012    s/p excision Allyson Sabal)  . HOH (hard of hearing)   . Depression   .  Chronic kidney disease     elevated PSA-no tx  . CVA (cerebral infarction) 04/20/2012    04/16/12 IMPRESSION:  1. 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 No mass effect or hemorrhage. Basilar artery perforator territory.  2. See MRA findings below.  3. Otherwise stable MRI appearance of the brain since 2011 including extensive perivascular spaces (etat crible appearance)  . Fall at home 06/12/2013    multiple rib fractures; traumatic hemopneumothorax/notes 06/12/2013 (06/14/2013)  . Hiatal hernia 04/2014    mod-large by CXR    Past Surgical History  Procedure Laterality Date  . Cholecystectomy  05/1995  . Exercise treadmill   09/12/2006    NML  . Esophagogastroduodenoscopy  08/18/2004    H. H. Dr. Fuller Plan  . Colonoscopy  05/01/2007    divericulosis, int hemorrhoids - Dr. Fuller Plan  . Dexa  05/2012    T score -2.8 hip, T11 compression fx  . Skin full thickness graft Left 10/19/2012    Procedure: REPAIR OF LEFT EAR WITH TISSUE REARRANGEMENT SKIN GRAFT FULL THICKNESS;  Surgeon: Theodoro Kos, DO;  Location: [place];  Service: Plastics;  Laterality: Left;  . US echocardiography  05/2013    Mod LVH, EF 60-65%, indeterminate diastolic fxn, aortic sclerosis without stenosis, mildly dilated RV with nl sys fxn, mod dilated arch  . Inguinal hernia repair Left 08/14/2010    Dr. Hassell Done  . Cataract extraction w/ intraocular lens  implant, bilateral Bilateral    Review of Systems Per HPI unless specifically indicated above     Objective:    BP 108/80 mmHg  Pulse 60  Temp(Src) 97.7 F (36.5 C)  Wt 170 lb (77.111 kg)  SpO2 96%  Wt Readings from Last 3 Encounters:  10/10/14 170 lb (77.111 kg)  09/25/14 172 lb (78.019 kg)  08/16/14 173 lb 12.8 oz (78.835 kg)    Physical Exam  Constitutional: He appears well-developed and well-nourished. No distress.  HENT:  Mouth/Throat: Oropharynx is clear and moist. No oropharyngeal exudate.  Cardiovascular: Normal rate, normal heart sounds and intact distal pulses.  An irregular rhythm present.  No murmur (none appreciated) heard. Pulmonary/Chest: Effort normal. No respiratory distress. He has decreased breath sounds. He has no wheezes. He has rhonchi (scattered). He has no rales.  Coarse throughout  Abdominal: Soft. Normal appearance and bowel sounds are normal. He exhibits no distension and no mass. There is no hepatosplenomegaly. There is no tenderness. There is no rigidity, no rebound, no guarding, no CVA tenderness and negative Murphy's sign.  Musculoskeletal: He exhibits edema (1+ pitting).  Psychiatric: He has a normal mood and affect.  Nursing note and  vitals reviewed.      Assessment & Plan:   Problem List Items Addressed This Visit    Mass of brain    Has established with home hospice. Appreciate their care of patient.      Irregular heart beat    H/o AV block with irregular rhythm today - recheck EKG today. EKG - NSR rate 90s, LAD, rare PVC, prolonged PR (1st degree AV block), good R wave progression, Q inferior leads      Relevant Orders   EKG 12-Lead (Completed)   Essential hypertension (Chronic)    bp low today - decrease lisinopril to 5mg  daily.      Relevant Medications   lisinopril (PRINIVIL,ZESTRIL) tablet   Dyspnea - Primary    Worsening dyspnea - recheck EKG today. Check labs today (CMP, CBC, TSH and BNP).  Will  call with results and plan. Abd exam benign today. rec restart lasix 10mg  prn      Relevant Orders   Brain natriuretic peptide (Completed)       Follow up plan: Return in about 2 months (around 12/10/2014), or if symptoms worsen or fail to improve, for follow up visit.

## 2014-10-10 NOTE — Assessment & Plan Note (Signed)
bp low today - decrease lisinopril to 5mg  daily.

## 2014-10-10 NOTE — Patient Instructions (Addendum)
Let's check EKG and blood work today. We will decide potassium dose after blood work. Decrease lisinopril to 5mg  daily (1/2 tablet daily until you run out then new dose will be at pharmacy). Good to see you, call us with questions.

## 2014-10-11 ENCOUNTER — Other Ambulatory Visit: Payer: Self-pay | Admitting: Family Medicine

## 2014-10-11 MED ORDER — POTASSIUM CHLORIDE ER 10 MEQ PO TBCR: 10.0000 meq | EXTENDED_RELEASE_TABLET | Freq: Every day | ORAL | Status: DC

## 2014-10-15 ENCOUNTER — Telehealth: Payer: Self-pay

## 2014-10-15 NOTE — Telephone Encounter (Signed)
Yes would recommend they notify hospice to return to see patient.

## 2014-10-15 NOTE — Telephone Encounter (Signed)
Called and spoken to Vaughan Basta (patient's daughter). Notified her of Dr Gutierrez's comments below and in Eva. She stated that her brother is the one who register their father on mychart. She will check with him on that. She had another question regarding hospice. She wants did they do everything that were needed to be done for hospice so they can call hospice to come out. They have already came to do the eval.

## 2014-10-15 NOTE — Telephone Encounter (Signed)
Duard Brady pts daughter left v/m requesting cb with results of 10/10/14 visit including EKG and lab results.

## 2014-10-15 NOTE — Telephone Encounter (Signed)
plz see MyChart results and notify daughter. EKG was stable. Let us know if they don't regularly check mychart and if that's the case would see if we can shut it off.

## 2014-10-16 ENCOUNTER — Telehealth: Payer: Self-pay

## 2014-10-16 NOTE — Telephone Encounter (Signed)
Christopher [name] with [facility] left v/m; received a call from pts family that they are now ready to proceed with having hospice see pt. Dewaine Oats said would go out to see pt to do admission on 10/17/14. No cb needed.

## 2014-10-16 NOTE — Telephone Encounter (Signed)
Patient's daughter notified.

## 2014-10-18 ENCOUNTER — Telehealth: Payer: Self-pay | Admitting: Family Medicine

## 2014-10-18 ENCOUNTER — Encounter: Payer: Self-pay | Admitting: Family Medicine

## 2014-10-18 DIAGNOSIS — Z66 Do not resuscitate: Secondary | ICD-10-CM

## 2014-10-18 DIAGNOSIS — Z515 Encounter for palliative care: Secondary | ICD-10-CM

## 2014-10-18 NOTE — Telephone Encounter (Signed)
Carols from hospice called.  She needs a verbal order for a DNR for pt. She said that pt requested that today when she went out to see pt today.  Best number to call is [phone].

## 2014-10-18 NOTE — Telephone Encounter (Signed)
To do dnr, thanks.

## 2014-10-21 ENCOUNTER — Telehealth: Payer: Self-pay

## 2014-10-21 ENCOUNTER — Other Ambulatory Visit: Payer: Self-pay | Admitting: Nurse Practitioner

## 2014-10-21 NOTE — Telephone Encounter (Signed)
Noted.  Let's increase lasix to 20mg  daily with second tablet as needed for swelling or dyspnea.  Stop lisinopril. Update if UOP doesn't improve with this after 1-2 days.

## 2014-10-21 NOTE — Telephone Encounter (Signed)
[name] with [facility] left v/m wanting to know if any med adjustments need to be done. On 10/18/14 pts BP was 140/70 and today BP 172/92. Pt weight is 174 lbs and has gained 4 lbs since last week. Pt has 3 + pitting edema in ankles which pt did not have last week; pt has SOB when talks and some SOB during the night; [name] thinks pt is retaining fluid; when pt was in the hospital pt had med changes; pt is presently taking lisinopril 5 mg and lasix 10 mg; prior to hospitalization pt was taking lisinopril 10 mg and lasix 20 mg. [name] request cb.

## 2014-10-21 NOTE — Telephone Encounter (Signed)
Message left advising Carlas.

## 2014-10-21 NOTE — Telephone Encounter (Signed)
Danielle at [facility] notified.

## 2014-10-22 ENCOUNTER — Telehealth: Payer: Self-pay

## 2014-10-22 NOTE — Telephone Encounter (Signed)
[name] with [facility] left v/m requesting verbal order for DNR; Pts family is asking for DNR;Hospice Dr will sign if OKed by Dr Danise Mina.Please advise.

## 2014-10-22 NOTE — Telephone Encounter (Signed)
We approved this last week. [place] for [employer]

## 2014-10-22 NOTE — Telephone Encounter (Signed)
Called Hospice and office had already closed for the day. Will call back tomorrow.

## 2014-10-23 NOTE — Telephone Encounter (Signed)
Christopher [name] notified and said she realized after she called that she had the approval from last week and didn't need it again.

## 2014-10-25 NOTE — Telephone Encounter (Signed)
Message left for [name] to return my call.

## 2014-10-25 NOTE — Telephone Encounter (Signed)
[name] with [facility] left v/m; today pts BP is elevated more at 210/84 . Pt's second tab as needed is not being utilized due to pts living conditions. Pt is winded when he talks; pt weight today is 180 lbs and that is a 6 lb weight gain since 10/21/14. Feet are 3+ - 4+ pitting edema.  [name] request cb before 5 PM.

## 2014-10-25 NOTE — Telephone Encounter (Signed)
Spoke with [name] and she said his UOP is fine. She will call on Monday with an update. She did say he denies CP and his SPO2 is 97%. She was going back to his house to fill his pill box. His son is out of town and he normally takes care of that. She also said his leg has started to weep due to the fluid. She will keep a check on that as well.

## 2014-10-25 NOTE — Telephone Encounter (Addendum)
How is UOP when he takes lasix? Let's increase lasix to 40mg  daily for 3 days and reassess.  Let's restart lisinopril 5mg  daily.

## 2014-10-28 ENCOUNTER — Telehealth: Payer: Self-pay | Admitting: Family Medicine

## 2014-10-28 NOTE — Telephone Encounter (Signed)
Continue lasix 40 mg daily.  

## 2014-10-28 NOTE — Telephone Encounter (Signed)
patty beard -  [facility]  with an update on pt  Had adjustments in medications. Pt is now  177 lbs  4+ pitting edema in both ankles, however measurements are better.  right ankle has gone from 11 inches to 9 -5/8  Left ankle was 10, decreased to 9- 5/8 Doing better, best number to call for additional questions is  [phone]

## 2014-10-29 ENCOUNTER — Other Ambulatory Visit: Payer: Self-pay | Admitting: Nurse Practitioner

## 2014-10-29 NOTE — Telephone Encounter (Signed)
Message left notifying Christopher [name].

## 2014-11-04 ENCOUNTER — Telehealth: Payer: Self-pay | Admitting: Family Medicine

## 2014-11-04 MED ORDER — LISINOPRIL 5 MG PO TABS: 10.0000 mg | ORAL_TABLET | Freq: Every day | ORAL | Status: AC

## 2014-11-04 NOTE — Telephone Encounter (Signed)
Vara Guardian called in letting us know that pt's blood pressure is 192/112.  Pt is taking lasix 40mg  and lisinporil 5mg .  Nurse wanted to know if you wanted to up the lisinopril to help control blood pressure issues? Best number to call to back is [phone]. Thanks.

## 2014-11-04 NOTE — Telephone Encounter (Signed)
Yes let's increase lisinopril back to 10mg  daily and update [place] with effect at end of week.

## 2014-11-05 NOTE — Telephone Encounter (Signed)
[name] called back i read the note dr g put in system  She stated she would call back on Friday

## 2014-11-05 NOTE — Telephone Encounter (Signed)
Message left for Christopher [name] to return my call.

## 2014-11-07 ENCOUNTER — Telehealth: Payer: Self-pay

## 2014-11-07 NOTE — Telephone Encounter (Signed)
Noted. Continue current regimen of lisinopril 10mg  daily and lasix prn.

## 2014-11-07 NOTE — Telephone Encounter (Signed)
Christopher [name] with hospice called with patients blood pressure reading 148/90.  PC34.  Patient says he feels pretty good today per Christopher [name].  He weighed 173lb today.

## 2014-11-07 NOTE — Telephone Encounter (Signed)
Jan at [facility] notified.

## 2014-11-11 ENCOUNTER — Other Ambulatory Visit: Payer: Self-pay | Admitting: Family Medicine

## 2014-11-11 ENCOUNTER — Other Ambulatory Visit: Payer: Self-pay | Admitting: Nurse Practitioner

## 2014-11-21 ENCOUNTER — Telehealth: Payer: Self-pay | Admitting: Family Medicine

## 2014-11-21 NOTE — Telephone Encounter (Signed)
hopsice called in Pt's edmea is controlled, weight is stable, ankle measurements are down, lungs are clear bp today is 140/100,  pt is sob, which comes and goes Pt has a fan on him, is helping with breathing.   Hospice will be back out to pt on Monday  Call if you have [phone]

## 2014-11-21 NOTE — Telephone Encounter (Signed)
Noted. Thanks. I'm glad.

## 2014-12-02 ENCOUNTER — Telehealth: Payer: Self-pay

## 2014-12-02 NOTE — Telephone Encounter (Signed)
Nurse manager at hospice notified and verbalized understanding. She will pass orders to [facility] who was not in the office at the time I called.

## 2014-12-02 NOTE — Telephone Encounter (Signed)
[name] with [facility] left v/m with update of pts status;  saw pt today; BP is 160/110; pt has been running high diastolic 341-937. Pt has 4+ pitting edema in both feet; weight stable usually running 171-173. Today weight is 172. Today pt is doing better with SOB. Pt does not have increased SOB today. [name] request cb and ask to speak with clinical manager if need to leave orders.

## 2014-12-02 NOTE — Telephone Encounter (Signed)
Is he making good UOP? If so, let's increase lasix to 40mg  bid x 3 days then back to 40mg  once daily. If not, then increase lasix to 80mg  once daily for 3 days then back down to 40mg  daily.  Lab Results  Component Value Date   CREATININE 1.40 10/10/2014

## 2014-12-12 ENCOUNTER — Telehealth: Payer: Self-pay | Admitting: *Deleted

## 2014-12-12 NOTE — Telephone Encounter (Signed)
Ok to draw these labs.  How is UOP and fluid status/weight?

## 2014-12-12 NOTE — Telephone Encounter (Signed)
Phone call received from home health nurse - Jill Poling, RN.  She is requesting a verbal order to draw labs for a CBC and metabolic panel.  She is in the patient's home now.  He was given an increased dose of Lasix last week (see 04/25 telephone encounter).  Would you like to add any other labs?

## 2014-12-12 NOTE — Telephone Encounter (Signed)
Weight ~173 (she was driving and couldn't remember the exact weight) and legs looked better today. 2+ edema which is improved from last week when it was 4+. His urine output is normal. She will send lab results when they come in and call with any status changes.

## 2014-12-13 ENCOUNTER — Other Ambulatory Visit: Payer: Self-pay | Admitting: Family Medicine

## 2014-12-13 ENCOUNTER — Encounter: Payer: Self-pay | Admitting: Family Medicine

## 2014-12-13 MED ORDER — POTASSIUM CHLORIDE ER 10 MEQ PO TBCR: 10.0000 meq | EXTENDED_RELEASE_TABLET | Freq: Two times a day (BID) | ORAL | Status: AC

## 2014-12-13 MED ORDER — FUROSEMIDE 40 MG PO TABS: 40.0000 mg | ORAL_TABLET | Freq: Every day | ORAL | Status: AC

## 2014-12-30 ENCOUNTER — Telehealth: Payer: Self-pay | Admitting: Family Medicine

## 2014-12-30 NOTE — Telephone Encounter (Signed)
Called and spoke with daughter - expressed my condolences.

## 2014-12-30 NOTE — Telephone Encounter (Signed)
Death certificate filled out.  

## 2015-01-08 DEATH — deceased

## 2015-01-13 ENCOUNTER — Ambulatory Visit: Payer: Medicare Other | Admitting: Podiatry
